# Patient Record
Sex: Female | Born: 1945 | Race: Black or African American | Hispanic: No | Marital: Married | Smoking: Never smoker
Health system: Southern US, Community
[De-identification: ages and names within clinical notes are randomized; demographics above are authoritative.]

## PROBLEM LIST (undated history)

## (undated) DIAGNOSIS — T8859XA Other complications of anesthesia, initial encounter: Secondary | ICD-10-CM

## (undated) DIAGNOSIS — M549 Dorsalgia, unspecified: Secondary | ICD-10-CM

## (undated) DIAGNOSIS — M2540 Effusion, unspecified joint: Secondary | ICD-10-CM

## (undated) DIAGNOSIS — G4700 Insomnia, unspecified: Secondary | ICD-10-CM

## (undated) DIAGNOSIS — G8929 Other chronic pain: Secondary | ICD-10-CM

## (undated) DIAGNOSIS — F329 Major depressive disorder, single episode, unspecified: Secondary | ICD-10-CM

## (undated) DIAGNOSIS — K5900 Constipation, unspecified: Secondary | ICD-10-CM

## (undated) DIAGNOSIS — I499 Cardiac arrhythmia, unspecified: Secondary | ICD-10-CM

## (undated) DIAGNOSIS — Z860100 Personal history of colon polyps, unspecified: Secondary | ICD-10-CM

## (undated) DIAGNOSIS — Z8719 Personal history of other diseases of the digestive system: Secondary | ICD-10-CM

## (undated) DIAGNOSIS — R600 Localized edema: Secondary | ICD-10-CM

## (undated) DIAGNOSIS — F419 Anxiety disorder, unspecified: Secondary | ICD-10-CM

## (undated) DIAGNOSIS — F32A Depression, unspecified: Secondary | ICD-10-CM

## (undated) DIAGNOSIS — I10 Essential (primary) hypertension: Secondary | ICD-10-CM

## (undated) DIAGNOSIS — Z8711 Personal history of peptic ulcer disease: Secondary | ICD-10-CM

## (undated) DIAGNOSIS — M1990 Unspecified osteoarthritis, unspecified site: Secondary | ICD-10-CM

## (undated) DIAGNOSIS — M2550 Pain in unspecified joint: Secondary | ICD-10-CM

## (undated) DIAGNOSIS — Z9289 Personal history of other medical treatment: Secondary | ICD-10-CM

## (undated) DIAGNOSIS — R609 Edema, unspecified: Secondary | ICD-10-CM

## (undated) DIAGNOSIS — R351 Nocturia: Secondary | ICD-10-CM

## (undated) DIAGNOSIS — T4145XA Adverse effect of unspecified anesthetic, initial encounter: Secondary | ICD-10-CM

## (undated) DIAGNOSIS — K219 Gastro-esophageal reflux disease without esophagitis: Secondary | ICD-10-CM

## (undated) HISTORY — PX: JOINT REPLACEMENT: SHX530

## (undated) HISTORY — PX: WISDOM TOOTH EXTRACTION: SHX21

## (undated) HISTORY — PX: BACK SURGERY: SHX140

## (undated) HISTORY — DX: Personal history of peptic ulcer disease: Z87.11

## (undated) HISTORY — PX: LUMBAR FUSION: SHX111

## (undated) HISTORY — DX: Dorsalgia, unspecified: M54.9

## (undated) HISTORY — PX: COLONOSCOPY: SHX174

## (undated) HISTORY — DX: Essential (primary) hypertension: I10

## (undated) HISTORY — DX: Other chronic pain: G89.29

## (undated) HISTORY — PX: SHOULDER SURGERY: SHX246

## (undated) HISTORY — DX: Personal history of other diseases of the digestive system: Z87.19

## (undated) HISTORY — DX: Unspecified osteoarthritis, unspecified site: M19.90

## (undated) HISTORY — PX: TOE SURGERY: SHX1073

## (undated) HISTORY — DX: Constipation, unspecified: K59.00

---

## 1997-12-29 ENCOUNTER — Ambulatory Visit (HOSPITAL_COMMUNITY): Admission: RE | Admit: 1997-12-29 | Discharge: 1997-12-29 | Payer: Self-pay | Admitting: Orthopedic Surgery

## 1998-05-21 ENCOUNTER — Encounter: Payer: Self-pay | Admitting: *Deleted

## 1998-05-21 ENCOUNTER — Ambulatory Visit (HOSPITAL_COMMUNITY): Admission: RE | Admit: 1998-05-21 | Discharge: 1998-05-21 | Payer: Self-pay | Admitting: *Deleted

## 1998-06-01 ENCOUNTER — Ambulatory Visit (HOSPITAL_COMMUNITY): Admission: RE | Admit: 1998-06-01 | Discharge: 1998-06-01 | Payer: Self-pay | Admitting: *Deleted

## 1998-06-01 ENCOUNTER — Encounter: Payer: Self-pay | Admitting: *Deleted

## 2001-01-14 ENCOUNTER — Emergency Department (HOSPITAL_COMMUNITY): Admission: EM | Admit: 2001-01-14 | Discharge: 2001-01-14 | Payer: Self-pay

## 2006-06-07 ENCOUNTER — Emergency Department (HOSPITAL_COMMUNITY): Admission: EM | Admit: 2006-06-07 | Discharge: 2006-06-07 | Payer: Self-pay | Admitting: Emergency Medicine

## 2008-01-04 ENCOUNTER — Ambulatory Visit (HOSPITAL_BASED_OUTPATIENT_CLINIC_OR_DEPARTMENT_OTHER): Admission: RE | Admit: 2008-01-04 | Discharge: 2008-01-04 | Payer: Self-pay | Admitting: Orthopedic Surgery

## 2010-10-29 NOTE — Op Note (Signed)
NAME:  Brandi [name], Brandi [name] NO.:  [id number]   MEDICAL RECORD NO.:  [id number]          PATIENT TYPE:  AMB   LOCATION:  [facility]                         FACILITY:  [facility]   PHYSICIAN:  Deidre Ala, M.D.    DATE OF BIRTH:  1945-11-21   DATE OF PROCEDURE:  01/04/2008  DATE OF DISCHARGE:                               OPERATIVE REPORT   PREOPERATIVE DIAGNOSIS:  1. Metatarsalgia, second metatarsal, right foot with transfer      pressurization with hallux rigidus.  2. Deformed right third toe nail, probable onychomycosis.   POSTOPERATIVE DIAGNOSIS:  1. Metatarsalgia, second metatarsal, right foot with transfer      pressurization with hallux rigidus.  2. Deformed right third toe nail, probable onychomycosis.   PROCEDURE:  1. Right second dorsal wedge osteotomy at metatarsal neck with local      autograft.  2. Third toe nail plate excision.   SURGEON:  Doristine Section, MD.   ASSISTANT:  Phineas Semen, PA-C.   ANESTHESIA:  General with LMA.   CULTURES:  None.   DRAINS:  None.   ESTIMATED BLOOD LOSS:  Minimal.   TOURNIQUET:  Esmarch tourniquet time 22 minutes.   PATHOLOGIC FINDINGS AND HISTORY:  Brandi [name] is an old patient of mine  who has had some previous foot surgery where she had surgery on the  right first MTP joint.  She has developed an arthritis there that makes  it stiff but not painful.  The problem is it causes a transfer lesion to  a relatively over long second metatarsal which then takes the pressure.  The third is not so inclined.  It was felt that she would benefit from a  dorsal wedge osteotomy of the second affecting upward depressurization  of the head as well as shortening.  This is what was done with local  autograft.  In addition, she desired removal but not ablation of her  right third toe nail which was overgrown, hard and onychomycotic, which  we did.   PROCEDURE:  With adequate anesthesia obtained using LMA technique, 1  gram Ancef was  given by IV prophylaxis, the patient was placed in the  supine position.  The right foot was prepped from the toes to the upper  calf in the standard fashion.  After standard prepping and draping,  Esmarch exsanguination was used and the tourniquet left on above the  ankle.  I then took a Freer and scissors and avulsed the nail plate off  the right third toe bed.  I then turned attention to the second  metatarsal where a web incision was made between 2 and 3.  The incision  was deepened sharply with a knife and hemostasis obtained using the  Bovie electrocoagulator.  Dissection was carried down to the metatarsal  neck, where soft tissues were dissected and the extensor tendon moved to  one side.  Retractors were placed and a dorsal wedge osteotomy was  carried out with an oscillating saw with a V shaped backwards.  I then  cracked the metatarsal head up to depressurize it and used the  wedge of  the bone removed with cancellous bone as morselized bone graft to pack  in and around it.  I did use one 3-0 Vicryl stitch on the dorsal  periosteum to hold the osteotomy in place.  I then closed the wound with  interrupted and running 4-0 nylon and placed a Adaptic gauze over the  right third toe nail bed and injected 0.5% Marcaine about the wound.  A  bulky compressive forefoot dressing was then carried out with gauze and  Ace and the patient was awakened, having tolerated the procedure well,  taken to the recovery room in satisfactory condition to be discharged  per outpatient routine, weightbearing as tolerated on her heel,  elevation and a wooden sole shoe.  Told to call the office for recheck  on Friday a.m. for a recheck.           ______________________________  V. Charlesetta Shanks, M.D.     VEP/MEDQ  D:  01/04/2008  T:  01/04/2008  Job:  [id number]

## 2010-11-05 LAB — HM MAMMOGRAPHY

## 2011-03-14 LAB — POCT HEMOGLOBIN-HEMACUE
Hemoglobin: 12.9
Operator id: 268271

## 2011-11-05 LAB — HM PAP SMEAR

## 2011-11-05 LAB — HM COLONOSCOPY

## 2012-07-02 ENCOUNTER — Encounter: Payer: Self-pay | Admitting: Medical

## 2012-07-02 ENCOUNTER — Ambulatory Visit (INDEPENDENT_AMBULATORY_CARE_PROVIDER_SITE_OTHER): Payer: 59 | Admitting: Medical

## 2012-07-02 VITALS — BP 140/80 | HR 68 | Temp 98.3°F | Resp 16 | Wt 139.0 lb

## 2012-07-02 DIAGNOSIS — G8929 Other chronic pain: Secondary | ICD-10-CM

## 2012-07-02 DIAGNOSIS — J329 Chronic sinusitis, unspecified: Secondary | ICD-10-CM

## 2012-07-02 DIAGNOSIS — M549 Dorsalgia, unspecified: Secondary | ICD-10-CM

## 2012-07-02 DIAGNOSIS — M1990 Unspecified osteoarthritis, unspecified site: Secondary | ICD-10-CM

## 2012-07-02 DIAGNOSIS — J069 Acute upper respiratory infection, unspecified: Secondary | ICD-10-CM

## 2012-07-02 MED ORDER — AMOXICILLIN-POT CLAVULANATE 875-125 MG PO TABS: 1.0000 | ORAL_TABLET | Freq: Two times a day (BID) | ORAL | Status: DC

## 2012-07-02 MED ORDER — OXYCODONE-ACETAMINOPHEN 5-325 MG PO TABS: 1.0000 | ORAL_TABLET | ORAL | Status: DC | PRN

## 2012-07-02 NOTE — Progress Notes (Signed)
Subjective: New patient today. Just moved back from [place].   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  Recommended by Dr. Doristine Section to come here.   Here for illness.  She reports sneezing, wheezing, runny eyes, headache, stopped up, cough, chest congestion, mucous production, achy, all x 2 weeks.  Husband been sick too with same.   Using some OTC Nyquil.  Ears feel stopped up, some sore throat.   Some nausea and vomiting.  She does not get flu vaccine as the last time gave her "12 colds."   She has never had the pneumonia vaccine.   Has hx/o arthritis.  Has arthritis back, fingers, and right ankle.   Hx/o lumbar fusion.  Been on Oxycodone/tylenol for years, but out of her pain medication.  Can't take NSAIDs due to hx/o ulcer. Would like short supply until she can see an arthritis doctor.    No other new c/o.   Allergies  Allergen Reactions  . Aspirin     Current Outpatient Prescriptions on File Prior to Visit  Medication Sig Dispense Refill  . ranitidine (ZANTAC) 75 MG tablet Take 75 mg by mouth 2 (two) times daily.        Past Medical History  Diagnosis Date  . Osteoarthritis   . Chronic back pain   . History of gastric ulcer     Past Surgical History  Procedure Date  . Foot surgery   . Lumbar fusion   . Shoulder surgery     History reviewed. No pertinent family history.  History   Social History  . Marital Status: Married    Spouse Name: N/A    Number of Children: N/A  . Years of Education: N/A   Occupational History  . Not on file.   Social History Main Topics  . Smoking status: Never Smoker   . Smokeless tobacco: Not on file  . Alcohol Use: No  . Drug Use: No  . Sexually Active: Not on file   Other Topics Concern  . Not on file   Social History Narrative  . No narrative on file   Reviewed their medical, surgical, family, social, medication, and allergy history and updated chart as appropriate.  Objective: Gen: wd, wn, nad, pleasant AA female,  appears a little older than stated age Skin: warm, dry Heent: tender over sinuses, TMs pearly, nares patent, pharynx normal Neck: supple, nontender, no mass, no thyromegaly Heart: RRR, normal S1, S2, no murmur Lungs: coarse, but no wheezes or rales Abdomen: nontender, no mass or organomegaly Back: lumbar vertical surgical scar, mild tenderness lumbar region, ROM somewhat slow and stiff MSK: mild tenderness of hand/fingers, but only mild arthritis changes, nontender rest of UE and LE    Assessment: Encounter Diagnoses  Name Primary?  . Sinusitis Yes  . URI (upper respiratory infection)   . Chronic back pain   . Osteoarthritis    Plan: Sinusitis, URI - script for Augmentin, nasal saline, Mucinex DM OTC, rest, hydrate well, recheck if not improving.  Chronic back pain, OA - #30 of Oxycodone/tylenol 5/325 for now.  Will request old records.   Discussed medications, other options.  I don't suspect abuse of medications.

## 2012-07-02 NOTE — Patient Instructions (Signed)
Consider nasal saline flush, consider Mucinex DM OTC for congestion and cough, increase water intake, and begin Augmentin antibiotic twice daily for 10 days with food.  Eat some  Yogurt while you are on this medication.  I am treating you for a sinus and respiratory infection.   Call or return if not improving.

## 2012-12-07 ENCOUNTER — Ambulatory Visit (INDEPENDENT_AMBULATORY_CARE_PROVIDER_SITE_OTHER): Payer: Medicare Other | Admitting: Medical

## 2012-12-07 ENCOUNTER — Encounter: Payer: Self-pay | Admitting: Medical

## 2012-12-07 VITALS — BP 130/80 | HR 72 | Temp 98.1°F | Resp 16 | Wt 141.0 lb

## 2012-12-07 DIAGNOSIS — Z800 Family history of malignant neoplasm of digestive organs: Secondary | ICD-10-CM

## 2012-12-07 DIAGNOSIS — J309 Allergic rhinitis, unspecified: Secondary | ICD-10-CM

## 2012-12-07 DIAGNOSIS — G8929 Other chronic pain: Secondary | ICD-10-CM

## 2012-12-07 DIAGNOSIS — M549 Dorsalgia, unspecified: Secondary | ICD-10-CM | POA: Diagnosis not present

## 2012-12-07 DIAGNOSIS — K5900 Constipation, unspecified: Secondary | ICD-10-CM | POA: Diagnosis not present

## 2012-12-07 DIAGNOSIS — G4700 Insomnia, unspecified: Secondary | ICD-10-CM

## 2012-12-07 MED ORDER — FLUTICASONE PROPIONATE 50 MCG/ACT NA SUSP: 2.0000 | Freq: Every day | NASAL | Status: DC

## 2012-12-07 MED ORDER — LUBIPROSTONE 24 MCG PO CAPS: 24.0000 ug | ORAL_CAPSULE | Freq: Two times a day (BID) | ORAL | Status: DC

## 2012-12-07 MED ORDER — CETIRIZINE HCL 10 MG PO TABS: 10.0000 mg | ORAL_TABLET | Freq: Every day | ORAL | Status: DC

## 2012-12-07 MED ORDER — OXYCODONE-ACETAMINOPHEN 5-325 MG PO TABS
1.0000 | ORAL_TABLET | ORAL | Status: DC | PRN
Start: 2012-12-07 — End: 2013-11-04

## 2012-12-07 NOTE — Progress Notes (Signed)
Subjective: Here for f/u and numerous concerns.  Has a list with her today.     She reports ongoing congestion problems since January.  She reports runny nose, sneezing, cough, headache, itchy watery eyes, head congestion.  She reports BMs 1-2 times per week, has to use laxatives frequently.  Can't go without laxative.    Has hx/o arthritis.  Has arthritis back, fingers, and right ankle.   Hx/o lumbar fusion.  Been on Oxycodone/tylenol for years, but out of her pain medication.  Can't take NSAIDs due to hx/o ulcer. Lately worse numbness in right big toe.  Pain with ROM.    Of note, has family history of colon cancer.  Mother, father, and sister all died from colon cancer.  Gets yearly colonoscopy.  insomnia - longstanding.  Partly due to pain.  But has problems falling and staying asleep.  Has used sleep aids in the past.  Wants help with this.   Allergies  Allergen Reactions  . Aspirin     Current Outpatient Prescriptions on File Prior to Visit  Medication Sig Dispense Refill  . ranitidine (ZANTAC) 75 MG tablet Take 75 mg by mouth 2 (two) times daily.       No current facility-administered medications on file prior to visit.    Past Medical History  Diagnosis Date  . Osteoarthritis   . Chronic back pain   . History of gastric ulcer     Past Surgical History  Procedure Laterality Date  . Foot surgery    . Lumbar fusion    . Shoulder surgery      No family history on file.  History   Social History  . Marital Status: Married    Spouse Name: N/A    Number of Children: N/A  . Years of Education: N/A   Occupational History  . Not on file.   Social History Main Topics  . Smoking status: Never Smoker   . Smokeless tobacco: Not on file  . Alcohol Use: No  . Drug Use: No  . Sexually Active: Not on file   Other Topics Concern  . Not on file   Social History Narrative  . No narrative on file   Reviewed their medical, surgical, family, social, medication, and  allergy history and updated chart as appropriate.  Objective: Gen: wd, wn, nad, pleasant AA female, appears a little older than stated age Skin: warm, dry Heent: conjunctiva pink, TMs pearly, nares patent, pharynx normal Neck: supple, nontender, no mass, no thyromegaly Heart: RRR, normal S1, S2, no murmur Lungs: clear Abdomen: nontender, no mass or organomegaly Back: lumbar vertical surgical scar, mild tenderness lumbar region, ROM limited due to pain MSK: mild tenderness of hand/fingers, but only mild arthritis changes, nontender rest of UE and LE Neuro: +bilat SLR, normal LE strength, DTRs, sensation Ext no edema Pulses normal    Assessment: Encounter Diagnoses  Name Primary?  . Allergic rhinitis Yes  . Chronic back pain   . Unspecified constipation   . Family history of colon cancer   . Insomnia    Plan: Allergic rhinitis - begin Flonase, cetirizine, avoid triggers.  Recheck 71mo Chronic back pain, OA - #30 of Oxycodone/tylenol 5/325 for now.  Will review old records.   Discussed medications, other options.  constipation - increase fiber and water intake, begin Amitiza.  Discussed risks/benefits of medications, options for management Family hx/o colon cancer - c/t routine colonoscopies insomnia - deferred to next visit

## 2012-12-13 ENCOUNTER — Telehealth: Payer: Self-pay | Admitting: Medical

## 2012-12-13 DIAGNOSIS — M25559 Pain in unspecified hip: Secondary | ICD-10-CM | POA: Diagnosis not present

## 2012-12-13 NOTE — Telephone Encounter (Signed)
FYI           3 RECORDS RELEASES FILLED OUT & PT TO COME BY & SIGN

## 2012-12-13 NOTE — Telephone Encounter (Signed)
Message copied by Ruffin Frederick on Mon Dec 13, 2012 11:48 AM ------      Message from: Jac Canavan      Created: Thu Dec 09, 2012  7:39 AM       Pt was in this week, signed records release.             The records we received basically show prior refills.  There isn't much else in the records.  I need additional info faxed to [place].  I have the chart and will bring back tomorrow            This is what I need form prior doctor to make referral to back specialist or pain clinic:      1) prior MRI, xrays, CT of back      2) prior dictated office notes showing her medical history, diagnosis, treatment plans.  There records I received showed very little, and noting to help the evaluation/management      3) prior labs, colonoscopy, mammogram, pap      4) prior specialist notes regarding her back and shoulder ------

## 2012-12-22 DIAGNOSIS — M25559 Pain in unspecified hip: Secondary | ICD-10-CM | POA: Diagnosis not present

## 2012-12-23 ENCOUNTER — Telehealth: Payer: Self-pay | Admitting: Medical

## 2012-12-24 ENCOUNTER — Encounter: Payer: Self-pay | Admitting: Internal Medicine

## 2012-12-24 NOTE — Telephone Encounter (Signed)
I gave specific information in prior phone message (look at that message again) showing what items I need from prior doctor.  I DO NOT have that information.  All I have is a bunch of chart records showing refill after refill.  There is basically no usable info in the records we have received to show me what her diagnoses are, what studies or treatments have been attempted, etc.   Pls pull paper chart.

## 2012-12-24 NOTE — Telephone Encounter (Signed)
Pt states she has sent the records over and we have them from the doctor who prescribed the med

## 2012-12-24 NOTE — Telephone Encounter (Signed)
Patient is aware that we need more information from her doctors as to why she is on this pain medication. The records that we received doesn't have any documentation as to why she takes the medication. CLS

## 2012-12-24 NOTE — Telephone Encounter (Signed)
See 12/13/12 msg.   Have we gotten in those records?   It will be hard for me to keep refilling such a strong pain medication without established records.

## 2012-12-24 NOTE — Telephone Encounter (Signed)
Here is the message.

## 2012-12-24 NOTE — Telephone Encounter (Signed)
lmom to cb. cls 

## 2013-01-03 ENCOUNTER — Encounter: Payer: Medicare Other | Admitting: Medical

## 2013-01-04 DIAGNOSIS — J019 Acute sinusitis, unspecified: Secondary | ICD-10-CM | POA: Diagnosis not present

## 2013-01-14 DIAGNOSIS — J3089 Other allergic rhinitis: Secondary | ICD-10-CM | POA: Diagnosis not present

## 2013-01-14 DIAGNOSIS — J301 Allergic rhinitis due to pollen: Secondary | ICD-10-CM | POA: Diagnosis not present

## 2013-01-14 DIAGNOSIS — J019 Acute sinusitis, unspecified: Secondary | ICD-10-CM | POA: Diagnosis not present

## 2013-01-14 DIAGNOSIS — H1045 Other chronic allergic conjunctivitis: Secondary | ICD-10-CM | POA: Diagnosis not present

## 2013-03-14 DIAGNOSIS — J019 Acute sinusitis, unspecified: Secondary | ICD-10-CM | POA: Diagnosis not present

## 2013-03-14 DIAGNOSIS — H6980 Other specified disorders of Eustachian tube, unspecified ear: Secondary | ICD-10-CM | POA: Diagnosis not present

## 2013-05-10 ENCOUNTER — Ambulatory Visit (INDEPENDENT_AMBULATORY_CARE_PROVIDER_SITE_OTHER): Payer: Medicare Other | Admitting: Medical

## 2013-05-10 ENCOUNTER — Encounter: Payer: Self-pay | Admitting: Medical

## 2013-05-10 VITALS — BP 140/80 | HR 83 | Temp 98.1°F | Resp 16 | Wt 144.0 lb

## 2013-05-10 DIAGNOSIS — J209 Acute bronchitis, unspecified: Secondary | ICD-10-CM

## 2013-05-10 DIAGNOSIS — H6123 Impacted cerumen, bilateral: Secondary | ICD-10-CM

## 2013-05-10 DIAGNOSIS — J3489 Other specified disorders of nose and nasal sinuses: Secondary | ICD-10-CM

## 2013-05-10 DIAGNOSIS — M545 Low back pain: Secondary | ICD-10-CM

## 2013-05-10 DIAGNOSIS — R0981 Nasal congestion: Secondary | ICD-10-CM

## 2013-05-10 DIAGNOSIS — H6120 Impacted cerumen, unspecified ear: Secondary | ICD-10-CM

## 2013-05-10 DIAGNOSIS — G8929 Other chronic pain: Secondary | ICD-10-CM

## 2013-05-10 MED ORDER — ALBUTEROL SULFATE HFA 108 (90 BASE) MCG/ACT IN AERS: 2.0000 | INHALATION_SPRAY | Freq: Four times a day (QID) | RESPIRATORY_TRACT | Status: DC | PRN

## 2013-05-10 MED ORDER — LEVOFLOXACIN 500 MG PO TABS: 500.0000 mg | ORAL_TABLET | Freq: Every day | ORAL | Status: DC

## 2013-05-10 MED ORDER — FLUTICASONE PROPIONATE 50 MCG/ACT NA SUSP: 2.0000 | Freq: Every day | NASAL | Status: DC

## 2013-05-10 MED ORDER — METHYLPREDNISOLONE 4 MG PO KIT: PACK | ORAL | Status: DC

## 2013-05-10 NOTE — Progress Notes (Signed)
Subjective:  Brandi [name] is a 67 y.o. female who presents for cough, sinus pressure.  She reports several weeks of congestion/cold symptoms but has had 7-8 day hx/o worse headache, ear pressure, sinus pressure, sore throat, teeth pain.  Denies fever, NVD, rash, SOB. No sick contacts.  No other aggravating or relieving factors.  No other c/o.  The following portions of the patient's history were reviewed and updated as appropriate: allergies, current medications, past family history, past medical history, past social history, past surgical history and problem list.  ROS as in subjective  Past Medical History  Diagnosis Date  . Osteoarthritis   . Chronic back pain   . History of gastric ulcer      Objective: BP 140/80  Pulse 83  Temp(Src) 98.1 F (36.7 C) (Oral)  Resp 16  Wt 144 lb (65.318 kg)  General appearance: Alert, WD/WN, no distress                             Skin: warm, no rash, no diaphoresis                           Head: maxillary sinus tenderness                            Eyes: conjunctiva normal, corneas clear, PERRLA                            Ears: pearly TMs, external ear canals normal                          Nose: septum midline, turbinates swollen, with erythema and clear discharge             Mouth/throat: MMM, tongue normal, mild pharyngeal erythema                           Neck: supple, no adenopathy, no thyromegaly, nontender                          Heart: RRR, normal S1, S2, no murmurs                         Lungs: +bronchial breath sounds, +scattered rhonchi, no wheezes, no rales                Extremities: no edema, nontender     Assessment: Encounter Diagnoses  Name Primary?  . Acute bronchitis Yes  . Sinus congestion   . Impacted cerumen, bilateral   . Chronic low back pain     Plan:  Medication orders today include:  Medrol dose pak, Albuterol inhaler, Flonase nasal, Levaquin.  Discussed diagnosis and treatment of bronchitis.   Suggested symptomatic OTC remedies for cough and congestion.  Tylenol or Ibuprofen OTC for fever and malaise.  Call/return in 2-3 days if symptoms are worse or not improving.   Return at her convenience for ear lavage.  She will have her prior provider in [place] send [place] xrays and a more thorough explanation of her treatment and diagnostic info regarding her chronic back pain.

## 2013-05-10 NOTE — Patient Instructions (Signed)
Rest, drink plenty of water, begin Medrol steroid Dosepak as directed.    Begin Albuterol inhaler, 1-2 puffs every 6 hours for cough, wheezing, chest tightness, or shortness of breath.     If fever, sinus pressure, worse congestion, then begin the antibiotic Levaquin.

## 2013-05-11 ENCOUNTER — Encounter: Payer: Self-pay | Admitting: Medical

## 2013-06-07 DIAGNOSIS — J309 Allergic rhinitis, unspecified: Secondary | ICD-10-CM | POA: Diagnosis not present

## 2013-06-14 ENCOUNTER — Encounter: Payer: Self-pay | Admitting: Medical

## 2013-06-14 ENCOUNTER — Ambulatory Visit (INDEPENDENT_AMBULATORY_CARE_PROVIDER_SITE_OTHER): Payer: Medicare Other | Admitting: Medical

## 2013-06-14 VITALS — BP 120/80 | HR 88 | Temp 98.1°F | Resp 16 | Wt 144.0 lb

## 2013-06-14 DIAGNOSIS — Z01818 Encounter for other preprocedural examination: Secondary | ICD-10-CM

## 2013-06-14 DIAGNOSIS — I459 Conduction disorder, unspecified: Secondary | ICD-10-CM

## 2013-06-14 DIAGNOSIS — I499 Cardiac arrhythmia, unspecified: Secondary | ICD-10-CM

## 2013-06-14 LAB — COMPREHENSIVE METABOLIC PANEL
ALT: 10 U/L (ref 0–35)
AST: 13 U/L (ref 0–37)
BUN: 9 mg/dL (ref 6–23)
CO2: 37 mEq/L — ABNORMAL HIGH (ref 19–32)
Creat: 0.9 mg/dL (ref 0.50–1.10)
Total Bilirubin: 0.4 mg/dL (ref 0.3–1.2)

## 2013-06-14 LAB — CBC WITH DIFFERENTIAL/PLATELET
Basophils Absolute: 0 10*3/uL (ref 0.0–0.1)
Basophils Relative: 0 % (ref 0–1)
Eosinophils Relative: 1 % (ref 0–5)
HCT: 38.3 % (ref 36.0–46.0)
Hemoglobin: 12.9 g/dL (ref 12.0–15.0)
Lymphocytes Relative: 32 % (ref 12–46)
MCHC: 33.7 g/dL (ref 30.0–36.0)
MCV: 97.7 fL (ref 78.0–100.0)
Monocytes Absolute: 0.9 10*3/uL (ref 0.1–1.0)
Monocytes Relative: 11 % (ref 3–12)
Neutro Abs: 4.8 10*3/uL (ref 1.7–7.7)
Neutrophils Relative %: 56 % (ref 43–77)
RDW: 14 % (ref 11.5–15.5)
WBC: 8.7 10*3/uL (ref 4.0–10.5)

## 2013-06-14 NOTE — Progress Notes (Signed)
Subjective:   HPI  Brandi [name] is a 67 y.o. female who presents for a preop exam. She is having oral surgery Monday for teeth extraction, IV sedation.  She went in to see the oral surgeon recently, a rhythm strip was done and apparently there was some concern for skip beat. She denies chest pain, shortness of breath, wheezing, swelling, palpitations, syncope or dizziness. Has been in her usual state of health except for the sinus drainage she has been dealing with. No other new complaints.    She has had a remote episode of stopping breathing on the operating table with surgery years ago, but since then has had several surgeries without any problems.  She notes hx/o abnormal EKG years ago, but her long term primary care provider in [place] was aware of this and never felt her to have any heart issues or problems.   Of note she has a colonoscopy every other year due to strong family history of colon cancer, last colonoscopy 2012 under IV sedation without any problem.  Reviewed their medical, surgical, family, social, medication, and allergy history and updated chart as appropriate.  Past Medical History  Diagnosis Date  . Osteoarthritis   . Chronic back pain   . History of gastric ulcer   . Constipation   . Anesthesia complication     years ago, but no problems with several surgeries since    Past Surgical History  Procedure Laterality Date  . Foot surgery    . Lumbar fusion    . Shoulder surgery      History   Social History  . Marital Status: Married    Spouse Name: N/A    Number of Children: N/A  . Years of Education: N/A   Occupational History  . Not on file.   Social History Main Topics  . Smoking status: Never Smoker   . Smokeless tobacco: Not on file  . Alcohol Use: No  . Drug Use: No  . Sexual Activity: Not on file   Other Topics Concern  . 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 Cancer Mother   . Cancer Father   . Heart disease Neg Hx   . Cancer Sister 28    died of colon cancer    Current outpatient prescriptions:albuterol (PROVENTIL HFA;VENTOLIN HFA) 108 (90 BASE) MCG/ACT inhaler, Inhale 2 puffs into the lungs every 6 (six) hours as needed for wheezing or shortness of breath., Disp: 1 Inhaler, Rfl: 0;  cetirizine (ZYRTEC) 10 MG tablet, Take 1 tablet (10 mg total) by mouth daily., Disp: 30 tablet, Rfl: 2;  estrogen, conjugated,-medroxyprogesterone (PREMPRO) 0.3-1.5 MG per tablet, Take 1 tablet by mouth daily., Disp: , Rfl:  fluticasone (FLONASE) 50 MCG/ACT nasal spray, Place 2 sprays into both nostrils daily., Disp: 16 g, Rfl: 5;  lubiprostone (AMITIZA) 24 MCG capsule, Take 1 capsule (24 mcg total) by mouth 2 (two) times daily with a meal., Disp: 60 capsule, Rfl: 2;  oxyCODONE-acetaminophen (ROXICET) 5-325 MG per tablet, Take 1 tablet by mouth every 4 (four) hours as needed for pain., Disp: 30 tablet, Rfl: 0 ranitidine (ZANTAC) 75 MG tablet, Take 75 mg by mouth 2 (two) times daily., Disp: , Rfl:   Allergies  Allergen Reactions  . Aspirin     Review of Systems Constitutional: -fever, -chills, -sweats, -unexpected weight change, -decreased appetite, -fatigue  Allergy: +sneezing, -itching, +congestion Dermatology: -changing moles, --rash, -lumps ENT: +runny nose, -ear pain, -sore throat, -hoarseness, -sinus pain, -teeth pain, - ringing in ears, -hearing loss, -nosebleeds Cardiology: -chest pain, -palpitations, -swelling, -difficulty breathing when lying flat, -waking up short of breath Respiratory: -cough, -shortness of breath, -difficulty breathing with exercise or exertion, -wheezing, -coughing up blood Gastroenterology: -abdominal pain, -nausea, -vomiting, -diarrhea, -constipation, -blood in stool, -changes in bowel movement, -difficulty swallowing or eating Hematology: -bleeding, -bruising  Musculoskeletal: -joint aches, -muscle  aches, -joint swelling, +chronic back pain, -neck pain, -cramping, -changes in gait Ophthalmology: denies vision changes, eye redness, itching, discharge Urology: -burning with urination, -difficulty urinating, -blood in urine, -urinary frequency, -urgency, -incontinence Neurology: -headache, -weakness, -tingling, -numbness, -memory loss, -falls, -dizziness Psychology: -depressed mood, -agitation, -sleep problems     Objective:   Physical Exam  BP 120/80  Pulse 88  Temp(Src) 98.1 F (36.7 C) (Oral)  Resp 16  Wt 144 lb (65.318 kg)  General appearance: alert, no distress, WD/WN, lean AA female Skin: roundish patches of rough dry skin on torso arms and legs throughout, suggestive of psoriasis vs eczema, no scaling or erythema HEENT: normocephalic, conjunctiva/corneas normal, sclerae anicteric, PERRLA, EOMi, nares patent, no discharge or erythema, pharynx normal Oral cavity: MMM, tongue normal Neck: supple, no lymphadenopathy, no thyromegaly, no masses, normal ROM, no bruits Chest: non tender, normal shape and expansion Heart: RRR, normal S1, S2, no murmurs Lungs: CTA bilaterally, no wheezes, rhonchi, or rales Abdomen: +bs, soft, non tender, non distended, no masses, no hepatomegaly, no splenomegaly, no bruits Back: mild lumbar paraspinal tenderness, mild pain with back flexion and extension Musculoskeletal: upper extremities non tender, no obvious deformity, normal ROM throughout, lower extremities non tender, no obvious deformity, normal ROM throughout Extremities: no edema, no cyanosis, no clubbing Pulses: 2+ symmetric, upper and lower extremities, normal cap refill Neurological: alert, oriented x 3, CN2-12 intact, strength normal upper extremities and lower extremities, sensation normal throughout, DTRs 2+ throughout, no cerebellar signs, gait normal Psychiatric: normal affect, behavior normal, pleasant  Breast/rectal/gyn - deferred   Adult ECG Report  Indication: abnormal rhythm  strip at oral surgeon office  Rate: 87bpm Rhythm: sinus rhythm with sinus arrythmia with occasional PVC  QRS Axis: 26 degrees  PR Interval:  QRS Duration: 66ms  QTc:  Conduction Disturbances: 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  . Preop general physical exam Yes  . Skipped heart beats      Physical exam - discussed healthy lifestyle, diet, exercise, preventative care, vaccinations, and addressed their concerns.  Handout given. Reviewed EKG.   we'll request her prior EKG from [place]/prior PCM, and we'll call her back with plan.  May or may not need to have cardiology clearance, to be determined.  Labs today.  Follow-up pending labs.

## 2013-06-15 ENCOUNTER — Other Ambulatory Visit: Payer: Self-pay | Admitting: Medical

## 2013-06-15 ENCOUNTER — Encounter: Payer: Self-pay | Admitting: Family Medicine

## 2013-06-15 ENCOUNTER — Other Ambulatory Visit: Payer: Self-pay | Admitting: Family Medicine

## 2013-06-15 LAB — TSH: TSH: 0.625 u[IU]/mL (ref 0.350–4.500)

## 2013-06-15 MED ORDER — PREDNISONE 20 MG PO TABS: 20.0000 mg | ORAL_TABLET | Freq: Two times a day (BID) | ORAL | Status: DC

## 2013-06-17 ENCOUNTER — Encounter: Payer: Self-pay | Admitting: Family Medicine

## 2013-06-23 ENCOUNTER — Encounter: Payer: Self-pay | Admitting: Medical

## 2013-08-10 ENCOUNTER — Ambulatory Visit (INDEPENDENT_AMBULATORY_CARE_PROVIDER_SITE_OTHER): Payer: Medicare Other | Admitting: Family Medicine

## 2013-08-10 ENCOUNTER — Encounter: Payer: Self-pay | Admitting: Family Medicine

## 2013-08-10 VITALS — BP 142/104 | HR 92 | Temp 98.4°F | Ht 64.0 in | Wt 151.0 lb

## 2013-08-10 DIAGNOSIS — J309 Allergic rhinitis, unspecified: Secondary | ICD-10-CM | POA: Diagnosis not present

## 2013-08-10 DIAGNOSIS — R197 Diarrhea, unspecified: Secondary | ICD-10-CM

## 2013-08-10 DIAGNOSIS — J069 Acute upper respiratory infection, unspecified: Secondary | ICD-10-CM

## 2013-08-10 DIAGNOSIS — J300 Vasomotor rhinitis: Secondary | ICD-10-CM

## 2013-08-10 DIAGNOSIS — R030 Elevated blood-pressure reading, without diagnosis of hypertension: Secondary | ICD-10-CM

## 2013-08-10 DIAGNOSIS — IMO0001 Reserved for inherently not codable concepts without codable children: Secondary | ICD-10-CM

## 2013-08-10 NOTE — Patient Instructions (Signed)
Drink plenty of fluids. You may have had a virus, but now your exam suggests at least a component of allergies.  You do not YET have a sinus infection--let's try and prevent that from developing. Restart flonase and zyrtec.  Use mucinex (guaifenesin) as needed to keep secretions thin.  Avoid decongestants/sinus medications which can raise blood pressure.  I recommend trial of sinus rinses--either Neti-pot or Sinus Rinse Kit.  Do this once or twice daily.  Check your blood pressure at home, and keep a list.  Bring the list of blood pressures and your monitor to your next visit (in 1-2 weeks). Try and follow a low sodium diet (see below).  For the diarrhea-- Avoid dairy products for at least 5 days.  Consider using probiotics (such as Align, vs yogurt form, like Activia).  Eat bland foods, avoid spicy, greasy, acidic foods, which might make your stools worse.  Sodium-Controlled Diet Sodium is a mineral. It is found in many foods. Sodium may be found naturally or added during the making of a food. The most common form of sodium is salt, which is made up of sodium and chloride. Reducing your sodium intake involves changing your eating habits. The following guidelines will help you reduce the sodium in your diet:  Stop using the salt shaker.  Use salt sparingly in cooking and baking.  Substitute with sodium-free seasonings and spices.  Do not use a salt substitute (potassium chloride) without your caregiver's permission.  Include a variety of fresh, unprocessed foods in your diet.  Limit the use of processed and convenience foods that are high in sodium. USE THE FOLLOWING FOODS SPARINGLY: Breads/Starches  Commercial bread stuffing, commercial pancake or waffle mixes, coating mixes. Waffles. Croutons. Prepared (boxed or frozen) potato, rice, or noodle mixes that contain salt or sodium. Salted [place] fries or hash browns. Salted popcorn, breads, crackers, chips, or snack  foods. Vegetables  Vegetables canned with salt or prepared in cream, butter, or cheese sauces. Sauerkraut. Tomato or vegetable juices canned with salt.  Fresh vegetables are allowed if rinsed thoroughly. Fruit  Fruit is okay to eat. Meat and Meat Substitutes  Salted or smoked meats, such as bacon or Canadian bacon, chipped or corned beef, hot dogs, salt pork, luncheon meats, pastrami, ham, or sausage. Canned or smoked fish, poultry, or meat. Processed cheese or cheese spreads, blue or Roquefort cheese. Battered or frozen fish products. Prepared spaghetti sauce. Baked beans. Reuben sandwiches. Salted nuts. Caviar. Milk  Limit buttermilk to 1 cup per week. Soups and Combination Foods  Bouillon cubes, canned or dried soups, broth, consomm. Convenience (frozen or packaged) dinners with more than 600 mg sodium. Pot pies, pizza, Asian food, fast food cheeseburgers, and specialty sandwiches. Desserts and Sweets  Regular (salted) desserts, pie, commercial fruit snack pies, commercial snack cakes, canned puddings.  Eat desserts and sweets in moderation. Fats and Oils  Gravy mixes or canned gravy. No more than 1 to 2 tbs of salad dressing. Chip dips.  Eat fats and oils in moderation. Beverages  See those listed under the vegetables and milk groups. Condiments  Ketchup, mustard, meat sauces, salsa, regular (salted) and lite soy sauce or mustard. Dill pickles, olives, meat tenderizer. Prepared horseradish or pickle relish. Dutch-processed cocoa. Baking powder or baking soda used medicinally. Worcestershire sauce. "Light" salt. Salt substitute, unless approved by your caregiver. Document Released: 11/22/2001 Document Revised: 08/25/2011 Document Reviewed: 06/25/2009 [facility] Patient Information 2014 [place], [place].

## 2013-08-10 NOTE — Progress Notes (Signed)
Chief Complaint  Patient presents with  . Facial Pain    started Sunday while in [place] with sneezing, runny nose and cough. Also complaining of HA's. Did take coricidin and nyquil-just one dose late last night.,   10 days ago, while visiting in [place] (and staying in hotels) she started sneezing, ears stopped up, runny nose.  Then she woke up with myalgias. Cold symptoms persisted all last week.  Myalgias have improved. She returned to [place] 2 days ago.  Her ears remain stopped up.  She continues to have runny nose, somewhat yellow.  Nose runs a lot when she eats.  She is coughing occasionally throughout the day, sometimes productive of thick phlegm, mucusy/stringy, slightly yellowish.  She has slight shortness of breath which she relates to her cold (when going up 2-3 flights of stairs in her house)--maybe more related to fatigue.  Doesn't feel like she is wheezing, hasn't used her inhaler when feeling short of breath.    She is having some headaches which go across the entire front of her head, from temple, across forehead, to the other temple.  It is worse in the light, similar to a migraine.  Denies fevers, nausea or vomiting.  She has had some diarrhea, seems some mucus on the outside of the stool.  Having some loose stools after eating.  +milk, cheese/dairy intake.  She took coricidin, nyquil.  Denies taking any decongestants  Past Medical History  Diagnosis Date  . Osteoarthritis   . Chronic back pain   . History of gastric ulcer   . Constipation   . Anesthesia complication     years ago, but no problems with several surgeries since   Past Surgical History  Procedure Laterality Date  . Foot surgery    . Lumbar fusion    . Shoulder surgery     History   Social History  . Marital Status: Married    Spouse Name: N/A    Number of Children: N/A  . Years of Education: N/A   Occupational History  . retired    Social History Main Topics  . Smoking status: Never Smoker   . Smokeless  tobacco: Never Used  . Alcohol Use: No  . Drug Use: No  . Sexual Activity: Not on file   Other Topics Concern  . Not on file   Social History Narrative   Retired from [employer] in [place], exercise - walking   retired Environmental manager, Musician, librarian    Outpatient Encounter Prescriptions as of 08/10/2013  Medication Sig  . estrogen, conjugated,-medroxyprogesterone (PREMPRO) 0.3-1.5 MG per tablet Take 1 tablet by mouth daily.  Marland Kitchen lubiprostone (AMITIZA) 24 MCG capsule Take 1 capsule (24 mcg total) by mouth 2 (two) times daily with a meal.  . oxyCODONE-acetaminophen (ROXICET) 5-325 MG per tablet Take 1 tablet by mouth every 4 (four) hours as needed for pain.  . ranitidine (ZANTAC) 75 MG tablet Take 75 mg by mouth 2 (two) times daily.  Marland Kitchen albuterol (PROVENTIL HFA;VENTOLIN HFA) 108 (90 BASE) MCG/ACT inhaler Inhale 2 puffs into the lungs every 6 (six) hours as needed for wheezing or shortness of breath.  . cetirizine (ZYRTEC) 10 MG tablet Take 1 tablet (10 mg total) by mouth daily.  . fluticasone (FLONASE) 50 MCG/ACT nasal spray Place 2 sprays into both nostrils daily.  . [DISCONTINUED] predniSONE (DELTASONE) 20 MG tablet Take 1 tablet (20 mg total) by mouth 2 (two) times daily with a meal.   (not currently using zyrtec,  albuterol or flonase)  Allergies  Allergen Reactions  . Aspirin   (h/o ulcer, no true allergy)  ROS:  Denies fevers, chills, nausea, vomiting, abdominal pain, dysuria, bleeding, bruising, rash, chest pain, palpitations, dizziness or other complaints except as noted in HPI  PHYSICAL EXAM: BP 142/104  Pulse 92  Temp(Src) 98.4 F (36.9 C) (Oral)  Ht 5' 4"  (1.626 m)  Wt 151 lb (68.493 kg)  BMI 25.91 kg/m2 172/104 on repeat by MD Very talkative, pleasant female in no distress.  Rarely cleared her throat, no cough or sneezing HEENT:  PERRL, EOMI, conjunctiva clear.  TM's and EAC's normal.  Nasal mucosa is moderately edematous, pale.  Mucus is clear,  slightly blood on the right.  Mild tenderness over frontal sinus.  OP without erythema or lesions Neck: no lymphadenopathy or mass Heart: regular rate and rhythm without murmur Lungs: clear bilaterally Abdomen: soft, nontender, no mass.  Normal bowel sounds Extremities: no edema, 2+ pulse Neuro: alert and oriented, cranial nerves intact, normal gait Psych: normal mood, affect   ASSESSMENT/PLAN:  Acute upper respiratory infections of unspecified site  Allergic rhinitis, cause unspecified  Nonallergic vasomotor rhinitis  Diarrhea  Elevated BP   URI vs allergies.  Seems to have hx which suggests URI, viral, but exam is consistent with allergies.  Has some rhinitis related to eating as well.  Reassured there is no evidence of infection.  Might have some frontal sinus pressure, but no acute infection. Restart flonase and zyrtec.  Use mucinex (guaifenesin) as needed to keep secretions thin.  Avoid decongestants/sinus medications which can raise blood pressure.  I recommend trial of sinus rinses--either Neti-pot or Sinus Rinse Kit.  Do this once or twice daily.  BP is elevated--no h/o HTN.  +salty diet (canned foods, chinese).  Reviewed low sodium diet.  Has BP monitor at home--to check and bring list and monitor to visit in 1-2 weeks.  Avoid dairy products for at least 5 days.  Consider using probiotics (such as Align, vs yogurt form, like Activia).  Eat bland foods, avoid spicy, greasy, acidic foods, which might make your stools worse.

## 2013-09-15 DIAGNOSIS — M25559 Pain in unspecified hip: Secondary | ICD-10-CM | POA: Diagnosis not present

## 2013-09-23 DIAGNOSIS — M25559 Pain in unspecified hip: Secondary | ICD-10-CM | POA: Diagnosis not present

## 2013-10-26 DIAGNOSIS — M545 Low back pain, unspecified: Secondary | ICD-10-CM | POA: Diagnosis not present

## 2013-10-26 DIAGNOSIS — M1610 Unilateral primary osteoarthritis, unspecified hip: Secondary | ICD-10-CM | POA: Diagnosis not present

## 2013-10-26 DIAGNOSIS — M169 Osteoarthritis of hip, unspecified: Secondary | ICD-10-CM | POA: Diagnosis not present

## 2013-11-04 ENCOUNTER — Telehealth: Payer: Self-pay

## 2013-11-04 NOTE — Telephone Encounter (Signed)
Pt  moved here from [place] last September.    NEW PATIENT  Medication and allergies:  Reviewed and updated  90 day supply/mail order: n/a Local pharmacy:  [id number] - [place], [place] - [address]   Immunizations due:  Tdap, PNA, Shingles   A/P: No changes to personal, family history or past surgical hx PAP- "1 1/2 years ago"--normal per patient CCS- "1 1/2 years ago"---normal per patient MMG- "3 years ago"---normal per patient Flu- did not receive Tdap- greater than 10 years ago PNA- has never received, DUE Shingles- has never received, DUE  To Discuss with Provider: Needs refill on estrogen, conjugated,-medroxyprogesterone (PREMPRO) 0.3-1.5 MG per tablet

## 2013-11-04 NOTE — Telephone Encounter (Signed)
Spoke with patient and she confirmed her appointment for Tuesday.  Was unable to complete Pre Visit call at this time.  She is currently on vacation and was driving at the time of call.  She stated that she would call back in 2 hours once they have reached their destination.     New patient

## 2013-11-08 ENCOUNTER — Ambulatory Visit (INDEPENDENT_AMBULATORY_CARE_PROVIDER_SITE_OTHER): Payer: Medicare Other | Admitting: Family Medicine

## 2013-11-08 ENCOUNTER — Encounter: Payer: Self-pay | Admitting: Family Medicine

## 2013-11-08 VITALS — BP 142/80 | HR 88 | Temp 98.6°F | Ht 65.0 in | Wt 148.8 lb

## 2013-11-08 DIAGNOSIS — K5900 Constipation, unspecified: Secondary | ICD-10-CM | POA: Diagnosis not present

## 2013-11-08 DIAGNOSIS — M549 Dorsalgia, unspecified: Secondary | ICD-10-CM | POA: Diagnosis not present

## 2013-11-08 DIAGNOSIS — K219 Gastro-esophageal reflux disease without esophagitis: Secondary | ICD-10-CM | POA: Insufficient documentation

## 2013-11-08 DIAGNOSIS — K5909 Other constipation: Secondary | ICD-10-CM | POA: Insufficient documentation

## 2013-11-08 MED ORDER — RANITIDINE HCL 150 MG PO TABS: 150.0000 mg | ORAL_TABLET | Freq: Two times a day (BID) | ORAL | Status: DC

## 2013-11-08 MED ORDER — OXYCODONE-ACETAMINOPHEN 5-325 MG PO TABS: 1.0000 | ORAL_TABLET | Freq: Four times a day (QID) | ORAL | Status: DC | PRN

## 2013-11-08 MED ORDER — CONJ ESTROG-MEDROXYPROGEST ACE 0.3-1.5 MG PO TABS: 1.0000 | ORAL_TABLET | Freq: Every day | ORAL | Status: DC

## 2013-11-08 NOTE — Progress Notes (Signed)
Subjective:    Patient ID: Brandi [name], female    DOB: 1946/01/06, 68 y.o.   MRN: [id number]  HPI Pt here to establish and get refills.  No complaints.      Review of Systems Review of Systems  Constitutional: Negative for activity change, appetite change and fatigue.  HENT: Negative for hearing loss, congestion, tinnitus and ear discharge.  dentist q23m Eyes: Negative for visual disturbance (see optho q1y -- vision corrected to 20/20 with glasses).  Respiratory: Negative for cough, chest tightness and shortness of breath.   Cardiovascular: Negative for chest pain, palpitations and leg swelling.  Gastrointestinal: Negative for abdominal pain, diarrhea, constipation and abdominal distention.  Genitourinary: Negative for urgency, frequency, decreased urine volume and difficulty urinating.  Musculoskeletal: Negative for back pain, arthralgias and gait problem.  Skin: Negative for color change, pallor and rash.  Neurological: Negative for dizziness, light-headedness, numbness and headaches.  Hematological: Negative for adenopathy. Does not bruise/bleed easily.  Psychiatric/Behavioral: Negative for suicidal ideas, confusion, sleep disturbance, self-injury, dysphoric mood, decreased concentration and agitation.     Past Medical History  Diagnosis Date  . Osteoarthritis   . Chronic back pain   . History of gastric ulcer   . Constipation   . Anesthesia complication     years ago, but no problems with several surgeries since  . Shoulder pain    History   Social History  . Marital Status: Married    Spouse Name: N/A    Number of Children: N/A  . Years of Education: N/A   Occupational History  . retired    Social History Main Topics  . Smoking status: Never Smoker   . Smokeless tobacco: Never Used  . Alcohol Use: No  . Drug Use: No  . Sexual Activity: Yes   Other Topics Concern  . Not on file   Social History Narrative   Retired from [employer] in [place], exercise - walking    retired Environmental manager, Musician, librarian   Family History  Problem Relation Age of Onset  . Cancer Mother     colon cancer  . Cancer Father     colon cancer  . Heart disease Neg Hx   . Cancer Sister 24    died of colon cancer   Current Outpatient Prescriptions  Medication Sig Dispense Refill  . clindamycin (CLEOCIN) 300 MG capsule Take 300 mg by mouth every 6 (six) hours.      Marland Kitchen estrogen, conjugated,-medroxyprogesterone (PREMPRO) 0.3-1.5 MG per tablet Take 1 tablet by mouth daily.  90 tablet  3  . lubiprostone (AMITIZA) 24 MCG capsule Take 1 capsule (24 mcg total) by mouth 2 (two) times daily with a meal.  60 capsule  2  . triamcinolone (NASACORT ALLERGY 24HR) 55 MCG/ACT AERO nasal inhaler Place 2 sprays into the nose daily.      Marland Kitchen oxyCODONE-acetaminophen (PERCOCET) 5-325 MG per tablet Take 1 tablet by mouth every 6 (six) hours as needed for severe pain. Written by Dr Desiree Lucy in [place] [facility]  120 tablet  0  . ranitidine (ZANTAC) 150 MG tablet Take 1 tablet (150 mg total) by mouth 2 (two) times daily.  180 tablet  3   No current facility-administered medications for this visit.       Objective:   Physical Exam  BP 142/80  Pulse 88  Temp(Src) 98.6 F (37 C) (Oral)  Ht 5\' 5"  (1.651 m)  Wt 148 lb 12.8 oz (67.495 kg)  BMI 24.76  kg/m2  SpO2 97% General appearance: alert, cooperative, appears stated age and no distress Nose: Nares normal. Septum midline. Mucosa normal. No drainage or sinus tenderness. Throat: lips, mucosa, and tongue normal; teeth and gums normal Neck: no adenopathy, supple, symmetrical, trachea midline and thyroid not enlarged, symmetric, no tenderness/mass/nodules Lungs: clear to auscultation bilaterally Heart: regular rate and rhythm, S1, S2 normal, no murmur, click, rub or gallop Abdomen: soft, non-tender; bowel sounds normal; no masses,  no organomegaly Extremities: extremities normal, atraumatic, no cyanosis or edema        Assessment & Plan:  1. GERD (gastroesophageal reflux disease) stable  - ranitidine (ZANTAC) 150 MG tablet; Take 1 tablet (150 mg total) by mouth 2 (two) times daily.  Dispense: 180 tablet; Refill: 3  2. Back pain Pt sees pain management in [place] (PERCOCET) 5-325 MG per tablet; Take 1 tablet by mouth every 6 (six) hours as needed for severe pain. Written by Dr Desiree Lucy in [place] [facility]  Dispense: 120 tablet; Refill: 0  3. Chronic constipation On amitiza F/u GI

## 2013-11-08 NOTE — Progress Notes (Signed)
Pre visit review using our clinic review tool, if applicable. No additional management support is needed unless otherwise documented below in the visit note. 

## 2013-11-08 NOTE — Patient Instructions (Signed)

## 2013-11-10 ENCOUNTER — Telehealth: Payer: Self-pay | Admitting: *Deleted

## 2013-11-10 NOTE — Telephone Encounter (Signed)
Caller name:  Jake Relation to pt:  self Call back number: [phone] [facility] [address]  Reason for call: [facility] need [facility] for estrogen, conjugated,-medroxyprogesterone (PREMPRO) 0.3-1.5 MG per tablet.  She request [place] to contact [facility] and ask for them to refax the prior authorization if that is what is needed.

## 2013-11-11 NOTE — Telephone Encounter (Signed)
Patient called stating that her pharmacy sent over a prior authorization for Prempro and has not heard from [place]. Also patient states that she really needs this to be approved soon. Please advise.

## 2013-11-14 NOTE — Telephone Encounter (Signed)
Prior authorization initiated. Awaiting response. JG//CMA

## 2013-11-29 DIAGNOSIS — M25579 Pain in unspecified ankle and joints of unspecified foot: Secondary | ICD-10-CM | POA: Diagnosis not present

## 2013-11-29 DIAGNOSIS — M19079 Primary osteoarthritis, unspecified ankle and foot: Secondary | ICD-10-CM | POA: Diagnosis not present

## 2013-11-30 ENCOUNTER — Other Ambulatory Visit: Payer: Self-pay | Admitting: Orthopedic Surgery

## 2013-11-30 DIAGNOSIS — M79606 Pain in leg, unspecified: Secondary | ICD-10-CM

## 2013-11-30 DIAGNOSIS — R609 Edema, unspecified: Secondary | ICD-10-CM

## 2013-12-01 ENCOUNTER — Inpatient Hospital Stay: Admission: RE | Admit: 2013-12-01 | Payer: 59 | Source: Ambulatory Visit

## 2013-12-01 ENCOUNTER — Encounter: Payer: Self-pay | Admitting: Physical Medicine & Rehabilitation

## 2013-12-07 ENCOUNTER — Ambulatory Visit
Admission: RE | Admit: 2013-12-07 | Discharge: 2013-12-07 | Disposition: A | Discharge status: Home / Self Care | Payer: Medicare Other | Source: Ambulatory Visit | Attending: Orthopedic Surgery | Admitting: Orthopedic Surgery

## 2013-12-07 DIAGNOSIS — R609 Edema, unspecified: Secondary | ICD-10-CM

## 2013-12-07 DIAGNOSIS — M7989 Other specified soft tissue disorders: Secondary | ICD-10-CM | POA: Diagnosis not present

## 2013-12-07 DIAGNOSIS — M79609 Pain in unspecified limb: Secondary | ICD-10-CM | POA: Diagnosis not present

## 2013-12-07 DIAGNOSIS — M79606 Pain in leg, unspecified: Secondary | ICD-10-CM

## 2013-12-29 NOTE — Telephone Encounter (Signed)
Completed.//AB/CMA 

## 2014-01-02 ENCOUNTER — Telehealth: Payer: Self-pay | Admitting: Family Medicine

## 2014-01-02 NOTE — Telephone Encounter (Signed)
Caller name: Evani Relation to pt: self  Call back number: [phone] Pharmacy: [facility]  Reason for call:  Pt is needing [place] to call the insurance company regarding Rx estrogen, conjugated,-medroxyprogesterone (PREMPRO) 0.3-1.5 MG per tablet so they can get it filled.  Pharmacy said they sent it 4 times.  WIll resend it again today.

## 2014-01-05 NOTE — Telephone Encounter (Signed)
Advised patient that information has been sent.

## 2014-01-05 NOTE — Telephone Encounter (Signed)
Pt called back. Just needs the insurance called.  Please call pt when complete.

## 2014-01-11 NOTE — Telephone Encounter (Signed)
Caller name:Kendallyn Relation to ER:XVQM Call back [place]:  Reason for call: pt states her insurance company advised her the PA request was not received. She would like for you to re-fax the paperwork and mark is "URGENT".

## 2014-01-11 NOTE — Telephone Encounter (Signed)
Message routed to [facility], [place]. Please advise.

## 2014-01-13 ENCOUNTER — Telehealth: Payer: Self-pay | Admitting: Family Medicine

## 2014-01-13 NOTE — Telephone Encounter (Signed)
[phone]   [employer] ID# [id number]  Please review previous messages.  Pt is getting frustrated about this.

## 2014-01-16 NOTE — Telephone Encounter (Signed)
Have spoken with Medicare Part D. They will be faxing questions to complete. QHUT#[id number] Advised patient

## 2014-01-16 NOTE — Telephone Encounter (Signed)
Called patient to inquire of the medications that she has tried in the past for the condition. States that she has been on [facility] since 2001. Has not tried any additional therapies.  Form given to Dr Etter Sjogren for review.

## 2014-01-17 NOTE — Telephone Encounter (Signed)
Prior Auth request for Prempro 0.3/1.5mg  has been faxed to [place] for approval.  Awaiting response.//AB/CMA

## 2014-01-19 NOTE — Telephone Encounter (Signed)
Received letter from [employer]) that prior authorization was denied.  Placed in folder for Dr. Etter Sjogren to give a recommendation.//AB/CMA

## 2014-01-19 NOTE — Telephone Encounter (Signed)
Spoke with the pt and informed her of the denial letter, which she was aware of.  Pt voiced her frustration regarding getting her Prempro rx.  Informed the pt that I will be making Dr. Etter Sjogren aware of the denial letter, and to get her recommendation.  Informed her that the insurance company sent a form to do an appeal for the Prempro, and the pt agreed.  Pt will be bring a receipt of her purchase of the Prempro to send with the appeal.//AB/CMA

## 2014-01-26 NOTE — Telephone Encounter (Signed)
Received signed Appeal letter from Dr. Etter Sjogren   All forms faxed to [place] at [phone]).  Confirmation received.  Awaiting response.//AB/CMA

## 2014-01-30 ENCOUNTER — Ambulatory Visit: Payer: 59 | Admitting: Physical Medicine & Rehabilitation

## 2014-02-06 NOTE — Telephone Encounter (Signed)
Received completed and signed Appeal form from Dr. Etter Sjogren.   Form faxed to [place] at [phone]).  Confirmation received.  Awaiting approval.//AB/CMA

## 2014-02-16 ENCOUNTER — Ambulatory Visit: Payer: Medicare Other | Admitting: Family Medicine

## 2014-02-17 ENCOUNTER — Ambulatory Visit (INDEPENDENT_AMBULATORY_CARE_PROVIDER_SITE_OTHER): Payer: Medicare Other | Admitting: Family Medicine

## 2014-02-17 ENCOUNTER — Encounter: Payer: Self-pay | Admitting: Family Medicine

## 2014-02-17 VITALS — BP 118/82 | HR 76 | Temp 99.6°F | Wt 142.9 lb

## 2014-02-17 DIAGNOSIS — G894 Chronic pain syndrome: Secondary | ICD-10-CM | POA: Diagnosis not present

## 2014-02-17 DIAGNOSIS — Z780 Asymptomatic menopausal state: Secondary | ICD-10-CM

## 2014-02-17 DIAGNOSIS — N951 Menopausal and female climacteric states: Secondary | ICD-10-CM | POA: Diagnosis not present

## 2014-02-17 DIAGNOSIS — G4700 Insomnia, unspecified: Secondary | ICD-10-CM | POA: Diagnosis not present

## 2014-02-17 MED ORDER — OXYCODONE-ACETAMINOPHEN 5-325 MG PO TABS: 1.0000 | ORAL_TABLET | Freq: Four times a day (QID) | ORAL | Status: DC | PRN

## 2014-02-17 MED ORDER — ZALEPLON 5 MG PO CAPS: 5.0000 mg | ORAL_CAPSULE | Freq: Every evening | ORAL | Status: DC | PRN

## 2014-02-17 MED ORDER — ESTRADIOL-NORETHINDRONE ACET 0.05-0.14 MG/DAY TD PTTW: 1.0000 | MEDICATED_PATCH | TRANSDERMAL | Status: DC

## 2014-02-17 MED ORDER — DICLOFENAC SODIUM 1 % TD GEL: 2.0000 g | Freq: Four times a day (QID) | TRANSDERMAL | Status: DC

## 2014-02-17 NOTE — Patient Instructions (Signed)
Menopause Menopause is the normal time of life when menstrual periods stop completely. Menopause is complete when you have missed 12 consecutive menstrual periods. It usually occurs between the ages of 48 years and 55 years. Very rarely does a woman develop menopause before the age of 40 years. At menopause, your ovaries stop producing the female hormones estrogen and progesterone. This can cause undesirable symptoms and also affect your health. Sometimes the symptoms may occur 4-5 years before the menopause begins. There is no relationship between menopause and:  Oral contraceptives.  Number of children you had.  Race.  The age your menstrual periods started (menarche). Heavy smokers and very thin women may develop menopause earlier in life. CAUSES  The ovaries stop producing the female hormones estrogen and progesterone.  Other causes include:  Surgery to remove both ovaries.  The ovaries stop functioning for no known reason.  Tumors of the pituitary gland in the brain.  Medical disease that affects the ovaries and hormone production.  Radiation treatment to the abdomen or pelvis.  Chemotherapy that affects the ovaries. SYMPTOMS   Hot flashes.  Night sweats.  Decrease in sex drive.  Vaginal dryness and thinning of the vagina causing painful intercourse.  Dryness of the skin and developing wrinkles.  Headaches.  Tiredness.  Irritability.  Memory problems.  Weight gain.  Bladder infections.  Hair growth of the face and chest.  Infertility. More serious symptoms include:  Loss of bone (osteoporosis) causing breaks (fractures).  Depression.  Hardening and narrowing of the arteries (atherosclerosis) causing heart attacks and strokes. DIAGNOSIS   When the menstrual periods have stopped for 12 straight months.  Physical exam.  Hormone studies of the blood. TREATMENT  There are many treatment choices and nearly as many questions about them. The  decisions to treat or not to treat menopausal changes is an individual choice made with your health care provider. Your health care provider can discuss the treatments with you. Together, you can decide which treatment will work best for you. Your treatment choices may include:   Hormone therapy (estrogen and progesterone).  Non-hormonal medicines.  Treating the individual symptoms with medicine (for example antidepressants for depression).  Herbal medicines that may help specific symptoms.  Counseling by a psychiatrist or psychologist.  Group therapy.  Lifestyle changes including:  Eating healthy.  Regular exercise.  Limiting caffeine and alcohol.  Stress management and meditation.  No treatment. HOME CARE INSTRUCTIONS   Take the medicine your health care provider gives you as directed.  Get plenty of sleep and rest.  Exercise regularly.  Eat a diet that contains calcium (good for the bones) and soy products (acts like estrogen hormone).  Avoid alcoholic beverages.  Do not smoke.  If you have hot flashes, dress in layers.  Take supplements, calcium, and vitamin D to strengthen bones.  You can use over-the-counter lubricants or moisturizers for vaginal dryness.  Group therapy is sometimes very helpful.  Acupuncture may be helpful in some cases. SEEK MEDICAL CARE IF:   You are not sure you are in menopause.  You are having menopausal symptoms and need advice and treatment.  You are still having menstrual periods after age 55 years.  You have pain with intercourse.  Menopause is complete (no menstrual period for 12 months) and you develop vaginal bleeding.  You need a referral to a specialist (gynecologist, psychiatrist, or psychologist) for treatment. SEEK IMMEDIATE MEDICAL CARE IF:   You have severe depression.  You have excessive vaginal bleeding.    You fell and think you have a broken bone.  You have pain when you urinate.  You develop leg or  chest pain.  You have a fast pounding heart beat (palpitations).  You have severe headaches.  You develop vision problems.  You feel a lump in your breast.  You have abdominal pain or severe indigestion. Document Released: 08/23/2003 Document Revised: 02/02/2013 Document Reviewed: 12/30/2012 [employer] Patient Information 2015 [employer]. This information is not intended to replace advice given to you by your health care provider. Make sure you discuss any questions you have with your health care provider.  

## 2014-02-17 NOTE — Progress Notes (Signed)
Pre visit review using our clinic review tool, if applicable. No additional management support is needed unless otherwise documented below in the visit note. 

## 2014-02-18 NOTE — Progress Notes (Signed)
   Subjective:    Patient ID: Brandi [name], female    DOB: 05/09/1946, 68 y.o.   MRN: [id number]  HPI Pt is here to discuss prempro --- her insurance will not pay for it.  We are in the process of appealing decision but in the meantime she needs something else.  Pt also needs a refill on pain meds for OA and she is struggling with sleep.  She can fall asleep but then wakes up in middle of night and can not fall back asleep.      Review of Systems As abovel     Objective:   Physical Exam  BP 118/82  Pulse 76  Temp(Src) 99.6 F (37.6 C) (Oral)  Wt 142 lb 13.7 oz (64.8 kg)  SpO2 96% General appearance: alert, cooperative and no distress Lungs: clear to auscultation bilaterally Heart: regular rate and rhythm, S1, S2 normal, no murmur, click, rub or gallop       Assessment & Plan:  1. Menopause Try different rx -- we will con't to try to get prempro for her in meantime - estradiol-norethindrone (COMBIPATCH) 0.05-0.14 MG/DAY; Place 1 patch onto the skin 2 (two) times a week.  Dispense: 8 patch; Refill: 12  2. Chronic pain syndrome Refill meds - diclofenac sodium (VOLTAREN) 1 % GEL; Apply 2 g topically 4 (four) times daily.  Dispense: 100 g; Refill: 1 - oxyCODONE-acetaminophen (PERCOCET) 5-325 MG per tablet; Take 1 tablet by mouth every 6 (six) hours as needed for severe pain.  Dispense: 120 tablet; Refill: 0  3. Insomnia Pt doesn't need sleep med every night or for full night.  Wanted something she could take if she woke up in middle of night and couldn't get back to sleep - zaleplon (SONATA) 5 MG capsule; Take 1 capsule (5 mg total) by mouth at bedtime as needed for sleep.  Dispense: 30 capsule; Refill: 0

## 2014-02-28 ENCOUNTER — Ambulatory Visit: Payer: 59 | Admitting: Physical Medicine & Rehabilitation

## 2014-03-02 ENCOUNTER — Ambulatory Visit (INDEPENDENT_AMBULATORY_CARE_PROVIDER_SITE_OTHER): Payer: Medicare Other | Admitting: Family Medicine

## 2014-03-02 ENCOUNTER — Encounter: Payer: Self-pay | Admitting: Family Medicine

## 2014-03-02 VITALS — BP 176/92 | HR 101 | Temp 98.7°F | Wt 155.9 lb

## 2014-03-02 DIAGNOSIS — M25559 Pain in unspecified hip: Secondary | ICD-10-CM | POA: Diagnosis not present

## 2014-03-02 DIAGNOSIS — IMO0001 Reserved for inherently not codable concepts without codable children: Secondary | ICD-10-CM

## 2014-03-02 DIAGNOSIS — R030 Elevated blood-pressure reading, without diagnosis of hypertension: Secondary | ICD-10-CM | POA: Diagnosis not present

## 2014-03-02 DIAGNOSIS — G4700 Insomnia, unspecified: Secondary | ICD-10-CM | POA: Diagnosis not present

## 2014-03-02 MED ORDER — TEMAZEPAM 30 MG PO CAPS: ORAL_CAPSULE | ORAL | Status: DC

## 2014-03-02 NOTE — Patient Instructions (Signed)
Insomnia Insomnia is frequent trouble falling and/or staying asleep. Insomnia can be a long term problem or a short term problem. Both are common. Insomnia can be a short term problem when the wakefulness is related to a certain stress or worry. Long term insomnia is often related to ongoing stress during waking hours and/or poor sleeping habits. Overtime, sleep deprivation itself can make the problem worse. Every little thing feels more severe because you are overtired and your ability to cope is decreased. CAUSES   Stress, anxiety, and depression.  Poor sleeping habits.  Distractions such as TV in the bedroom.  Naps close to bedtime.  Engaging in emotionally charged conversations before bed.  Technical reading before sleep.  Alcohol and other sedatives. They may make the problem worse. They can hurt normal sleep patterns and normal dream activity.  Stimulants such as caffeine for several hours prior to bedtime.  Pain syndromes and shortness of breath can cause insomnia.  Exercise late at night.  Changing time zones may cause sleeping problems (jet lag). It is sometimes helpful to have someone observe your sleeping patterns. They should look for periods of not breathing during the night (sleep apnea). They should also look to see how long those periods last. If you live alone or observers are uncertain, you can also be observed at a sleep clinic where your sleep patterns will be professionally monitored. Sleep apnea requires a checkup and treatment. Give your caregivers your medical history. Give your caregivers observations your family has made about your sleep.  SYMPTOMS   Not feeling rested in the morning.  Anxiety and restlessness at bedtime.  Difficulty falling and staying asleep. TREATMENT   Your caregiver may prescribe treatment for an underlying medical disorders. Your caregiver can give advice or help if you are using alcohol or other drugs for self-medication. Treatment  of underlying problems will usually eliminate insomnia problems.  Medications can be prescribed for short time use. They are generally not recommended for lengthy use.  Over-the-counter sleep medicines are not recommended for lengthy use. They can be habit forming.  You can promote easier sleeping by making lifestyle changes such as:  Using relaxation techniques that help with breathing and reduce muscle tension.  Exercising earlier in the day.  Changing your diet and the time of your last meal. No night time snacks.  Establish a regular time to go to bed.  Counseling can help with stressful problems and worry.  Soothing music and white noise may be helpful if there are background noises you cannot remove.  Stop tedious detailed work at least one hour before bedtime. HOME CARE INSTRUCTIONS   Keep a diary. Inform your caregiver about your progress. This includes any medication side effects. See your caregiver regularly. Take note of:  Times when you are asleep.  Times when you are awake during the night.  The quality of your sleep.  How you feel the next day. This information will help your caregiver care for you.  Get out of bed if you are still awake after 15 minutes. Read or do some quiet activity. Keep the lights down. Wait until you feel sleepy and go back to bed.  Keep regular sleeping and waking hours. Avoid naps.  Exercise regularly.  Avoid distractions at bedtime. Distractions include watching television or engaging in any intense or detailed activity like attempting to balance the household checkbook.  Develop a bedtime ritual. Keep a familiar routine of bathing, brushing your teeth, climbing into bed at the same   time each night, listening to soothing music. Routines increase the success of falling to sleep faster.  Use relaxation techniques. This can be using breathing and muscle tension release routines. It can also include visualizing peaceful scenes. You can  also help control troubling or intruding thoughts by keeping your mind occupied with boring or repetitive thoughts like the old concept of counting sheep. You can make it more creative like imagining planting one beautiful flower after another in your backyard garden.  During your day, work to eliminate stress. When this is not possible use some of the previous suggestions to help reduce the anxiety that accompanies stressful situations. MAKE SURE YOU:   Understand these instructions.  Will watch your condition.  Will get help right away if you are not doing well or get worse. Document Released: 05/30/2000 Document Revised: 08/25/2011 Document Reviewed: 06/30/2007 ExitCare Patient Information 2015 [employer]. This information is not intended to replace advice given to you by your health care provider. Make sure you discuss any questions you have with your health care provider.  

## 2014-03-02 NOTE — Progress Notes (Signed)
   Subjective:    Patient ID: Brandi [name], female    DOB: 06/13/1946, 68 y.o.   MRN: [id number]  HPI Pt here to discuss HRT.  Ins co denied patch as well.    Review of Systems    as above Objective:   Physical Exam  BP 176/92  Pulse 101  Temp(Src) 98.7 F (37.1 C) (Rectal)  Wt 155 lb 13.8 oz (70.7 kg)  SpO2 98% General appearance: alert, cooperative, appears stated age and no distress Neck: no adenopathy, supple, symmetrical, trachea midline and thyroid not enlarged, symmetric, no tenderness/mass/nodules Lungs: clear to auscultation bilaterally Heart: S1, S2 normal      Assessment & Plan:  1. Insomnia  - temazepam (RESTORIL) 30 MG capsule; 1 po qhs prn sleep  Dispense: 30 capsule; Refill: 0  2. Elevated BP Pt is on cold meds and decongestants--- recheck in 2-3 weeks  3. Menopause/ hot flashes---- we need to call Ins co to figure out what they will pay for

## 2014-03-06 ENCOUNTER — Telehealth: Payer: Self-pay | Admitting: Family Medicine

## 2014-03-06 NOTE — Telephone Encounter (Signed)
noted 

## 2014-03-06 NOTE — Telephone Encounter (Signed)
C/o:  Blood in stool. Described as greenish, black stools since Saturday night.  According to patient, can smell blood in stool. Stool consistency ranges from formed to liquid.  Denies abdominal pain or cramping.  + Heartburn.  Taking Zantac.  Burning sensation relieved with Zantac.  Lack of appetite.  Eating prompts bowel movements.  Feels weak.  Been in bed x 3 days.  Dizziness upon standing.    Hx. GERD, stomach ulcer,  Benign polys, family history-colon cancer, and chronic constipation.    Last colonoscopy 2 years ago in [place].    Advice: Due to weakness and dizziness patient was encouraged to go to urgent care today.  Pt stated that she would rather wait and see Dr. Etter [name] tomorrow at 1:15pm.  She was advised that if her symptoms worsen, to go to ER.  Pt stated understanding and agreed.

## 2014-03-06 NOTE — Telephone Encounter (Signed)
Would you like the patient triaged, she has an apt tomorrow.     KP

## 2014-03-06 NOTE — Telephone Encounter (Signed)
Yes please

## 2014-03-06 NOTE — Telephone Encounter (Signed)
Caller name: Chrislyn  Relation to pt: self  Call back number: [phone] Pharmacy:  Reason for call:   pt scheduled an acute appointment with you 03/07/14 due to blood in her stool for the past 3 day. Pt is not expereicing any pain. Please advise

## 2014-03-07 ENCOUNTER — Ambulatory Visit (INDEPENDENT_AMBULATORY_CARE_PROVIDER_SITE_OTHER): Payer: Medicare Other | Admitting: Family Medicine

## 2014-03-07 ENCOUNTER — Encounter: Payer: Self-pay | Admitting: Family Medicine

## 2014-03-07 VITALS — BP 168/98 | HR 96 | Temp 98.8°F | Wt 146.8 lb

## 2014-03-07 DIAGNOSIS — R634 Abnormal weight loss: Secondary | ICD-10-CM

## 2014-03-07 DIAGNOSIS — G8929 Other chronic pain: Secondary | ICD-10-CM | POA: Diagnosis not present

## 2014-03-07 DIAGNOSIS — K625 Hemorrhage of anus and rectum: Secondary | ICD-10-CM | POA: Diagnosis not present

## 2014-03-07 DIAGNOSIS — K219 Gastro-esophageal reflux disease without esophagitis: Secondary | ICD-10-CM | POA: Diagnosis not present

## 2014-03-07 DIAGNOSIS — Z79899 Other long term (current) drug therapy: Secondary | ICD-10-CM | POA: Diagnosis not present

## 2014-03-07 LAB — BASIC METABOLIC PANEL
BUN: 6 mg/dL (ref 6–23)
CHLORIDE: 107 meq/L (ref 96–112)
CO2: 23 meq/L (ref 19–32)
Calcium: 9.4 mg/dL (ref 8.4–10.5)
Creatinine, Ser: 1.1 mg/dL (ref 0.4–1.2)
GFR: 66.62 mL/min (ref >60.00)
Glucose, Bld: 82 mg/dL (ref 70–99)
Potassium: 3.3 mEq/L — ABNORMAL LOW (ref 3.5–5.1)
Sodium: 138 mEq/L (ref 135–145)

## 2014-03-07 LAB — CBC WITH DIFFERENTIAL/PLATELET
BASOS ABS: 0 10*3/uL (ref 0.0–0.1)
BASOS PCT: 0.4 % (ref 0.0–3.0)
EOS ABS: 0.1 10*3/uL (ref 0.0–0.7)
Eosinophils Relative: 1.3 % (ref 0.0–5.0)
HCT: 41.3 % (ref 36.0–46.0)
Hemoglobin: 13.7 g/dL (ref 12.0–15.0)
Lymphocytes Relative: 15.8 % (ref 12.0–46.0)
Lymphs Abs: 1.6 10*3/uL (ref 0.7–4.0)
MCHC: 33.1 g/dL (ref 30.0–36.0)
MCV: 98.1 fl (ref 78.0–100.0)
MONO ABS: 0.4 10*3/uL (ref 0.1–1.0)
Monocytes Relative: 4.3 % (ref 3.0–12.0)
NEUTROS ABS: 8 10*3/uL — AB (ref 1.4–7.7)
NEUTROS PCT: 78.2 % — AB (ref 43.0–77.0)
Platelets: 378 10*3/uL (ref 150.0–400.0)
RBC: 4.21 Mil/uL (ref 3.87–5.11)
RDW: 14.3 % (ref 11.5–15.5)
WBC: 10.3 10*3/uL (ref 4.0–10.5)

## 2014-03-07 LAB — HEPATIC FUNCTION PANEL
ALBUMIN: 4.3 g/dL (ref 3.5–5.2)
ALT: 13 U/L (ref 0–35)
AST: 15 U/L (ref 0–37)
Alkaline Phosphatase: 66 U/L (ref 39–117)
BILIRUBIN DIRECT: 0 mg/dL (ref 0.0–0.3)
TOTAL PROTEIN: 8.4 g/dL — AB (ref 6.0–8.3)
Total Bilirubin: 0.5 mg/dL (ref 0.2–1.2)

## 2014-03-07 LAB — H. PYLORI ANTIBODY, IGG: H Pylori IgG: NEGATIVE

## 2014-03-07 MED ORDER — GI COCKTAIL ~~LOC~~
30.0000 mL | Freq: Once | ORAL | Status: AC
  Administered 2014-03-07: 30 mL via ORAL

## 2014-03-07 MED ORDER — DROSPIRENONE-ESTRADIOL 0.5-1 MG PO TABS: 1.0000 | ORAL_TABLET | Freq: Every day | ORAL | Status: DC

## 2014-03-07 MED ORDER — OXYCODONE-ACETAMINOPHEN 7.5-325 MG PO TABS: 1.0000 | ORAL_TABLET | ORAL | Status: DC | PRN

## 2014-03-07 MED ORDER — OMEPRAZOLE 40 MG PO CPDR: 40.0000 mg | DELAYED_RELEASE_CAPSULE | Freq: Every day | ORAL | Status: DC

## 2014-03-07 NOTE — Telephone Encounter (Addendum)
Received notice of denial for Prempro 0.3/1.5mg  via fax from [employer] [facility] ([facility] [employer]  Called and spoke with representative at [employer] and she stated if we still wanted to do an appeal we will need to fill out an request for reconsideration of Medicare prescription drug denial. The request will need to go to [employer].  Requested form was completed and placed in folder for Dr. Etter Sjogren to sign.  Dr. Etter Sjogren recommend to check and see what alternative meds the pt's insurance will cover.  Kim called [employer] and was told that Medicare will not cover any menopause medications.  But was informed that her 2nd insurance will pick up 2 alternative meds:Activella 0.5mg  #28,Angeliq 0.5mg /1mg  #28.  Dr. Nonda Lou recommendation was to try the Angeliq 0.5/1mg .  Pt was informed of medication change and agreed.  New rx was ordered and sent to the pt's pharmacy.//AB/CMA

## 2014-03-07 NOTE — Addendum Note (Signed)
Addended by: Harl Bowie on: 03/07/2014 07:57 PM   Modules accepted: Orders

## 2014-03-07 NOTE — Patient Instructions (Signed)
Rectal Bleeding °Rectal bleeding is when blood passes out of the anus. It is usually a sign that something is wrong. It may not be serious, but it should always be evaluated. Rectal bleeding may present as bright red blood or extremely dark stools. The color may range from dark red or maroon to black (like tar). It is important that the cause of rectal bleeding be identified so treatment can be started and the problem corrected. °CAUSES  °· Hemorrhoids. These are enlarged (dilated) blood vessels or veins in the anal or rectal area. °· Fistulas. These are abnormal, burrowing channels that usually run from inside the rectum to the skin around the anus. They can bleed. °· Anal fissures. This is a tear in the tissue of the anus. Bleeding occurs with bowel movements. °· Diverticulosis. This is a condition in which pockets or sacs project from the bowel wall. Occasionally, the sacs can bleed. °· Diverticulitis. This is an infection involving diverticulosis of the colon. °· Proctitis and colitis. These are conditions in which the rectum, colon, or both, can become inflamed and pitted (ulcerated). °· Polyps and cancer. Polyps are non-cancerous (benign) growths in the colon that may bleed. Certain types of polyps turn into cancer. °· Protrusion of the rectum. Part of the rectum can project from the anus and bleed. °· Certain medicines. °· Intestinal infections. °· Blood vessel abnormalities. °HOME CARE INSTRUCTIONS °· Eat a high-fiber diet to keep your stool soft. °· Limit activity. °· Drink enough fluids to keep your urine clear or pale yellow. °· Warm baths may be useful to soothe rectal pain. °· Follow up with your caregiver as directed. °SEEK IMMEDIATE MEDICAL CARE IF: °· You develop increased bleeding. °· You have black or dark red stools. °· You vomit blood or material that looks like coffee grounds. °· You have abdominal pain or tenderness. °· You have a fever. °· You feel weak, nauseous, or you faint. °· You have  severe rectal pain or you are unable to have a bowel movement. °MAKE SURE YOU: °· Understand these instructions. °· Will watch your condition. °· Will get help right away if you are not doing well or get worse. °Document Released: 11/22/2001 Document Revised: 08/25/2011 Document Reviewed: 11/17/2010 °ExitCare® Patient Information ©2015 [employer]. This information is not intended to replace advice given to you by your health care provider. Make sure you discuss any questions you have with your health care provider. ° °

## 2014-03-07 NOTE — Progress Notes (Signed)
   Subjective:    Patient ID: Brandi [name], female    DOB: 04/10/1946, 68 y.o.   MRN: [id number]  HPI Pt here c/o blood in stool for the last week.  No abd pain but + heartburn.   9 lb weight loss in last week.  Pt states she just has dec appetite.     Review of Systemst As above    Objective:   Physical Exam BP 168/98  Pulse 96  Temp(Src) 98.8 F (37.1 C) (Oral)  Wt 146 lb 12.8 oz (66.588 kg)  SpO2 99% General appearance: alert, cooperative, appears stated age and mild distress Abdomen: soft, non-tender; bowel sounds normal; no masses,  no organomegaly Rectal-- heme + brown stool       Assessment & Plan:  1. Chronic pain  - oxyCODONE-acetaminophen (PERCOCET) 7.5-325 MG per tablet; Take 1 tablet by mouth every 4 (four) hours as needed for pain.  Dispense: 120 tablet; Refill: 0  2. Gastroesophageal reflux disease without esophagitis Refer to GI - omeprazole (PRILOSEC) 40 MG capsule; Take 1 capsule (40 mg total) by mouth daily.  Dispense: 30 capsule; Refill: 3 - Basic metabolic panel - CBC with Differential - H. pylori antibody, IgG - Hepatic function panel  3. Loss of weight  - Ambulatory referral to Gastroenterology - Basic metabolic panel - CBC with Differential - H. pylori antibody, IgG - Hepatic function panel  4. Rectal bleeding  - Ambulatory referral to Gastroenterology

## 2014-03-07 NOTE — Progress Notes (Signed)
Pre visit review using our clinic review tool, if applicable. No additional management support is needed unless otherwise documented below in the visit note. 

## 2014-03-07 NOTE — Addendum Note (Signed)
Addended by: Ewing Schlein on: 03/07/2014 03:28 PM   Modules accepted: Orders

## 2014-03-13 ENCOUNTER — Ambulatory Visit (INDEPENDENT_AMBULATORY_CARE_PROVIDER_SITE_OTHER): Payer: Medicare Other | Admitting: Family Medicine

## 2014-03-13 ENCOUNTER — Encounter: Payer: Self-pay | Admitting: Family Medicine

## 2014-03-13 VITALS — BP 155/101 | HR 134 | Temp 98.9°F | Wt 141.8 lb

## 2014-03-13 DIAGNOSIS — R002 Palpitations: Secondary | ICD-10-CM

## 2014-03-13 DIAGNOSIS — I10 Essential (primary) hypertension: Secondary | ICD-10-CM

## 2014-03-13 MED ORDER — METOPROLOL SUCCINATE ER 50 MG PO TB24
50.0000 mg | ORAL_TABLET | Freq: Every day | ORAL | Status: DC
Start: 2014-03-13 — End: 2014-04-10

## 2014-03-13 NOTE — Progress Notes (Signed)
Pre visit review using our clinic review tool, if applicable. No additional management support is needed unless otherwise documented below in the visit note. 

## 2014-03-13 NOTE — Progress Notes (Signed)
  Subjective:    Patient here for follow-up of elevated blood pressure.  She is not exercising and is not adherent to a low-salt diet.  Blood pressure is not well controlled at home. Cardiac symptoms: fatigue and palpitations. Patient denies: chest pain, chest pressure/discomfort, claudication, dyspnea, exertional chest pressure/discomfort, lower extremity edema, near-syncope, orthopnea, paroxysmal nocturnal dyspnea, syncope and tachypnea. Cardiovascular risk factors: hypertension and sedentary lifestyle. Use of agents associated with hypertension: decongestants. History of target organ damage: none.  The following portions of the patient's history were reviewed and updated as appropriate: allergies, current medications, past family history, past medical history, past social history, past surgical history and problem list.  Review of Systems Pertinent items are noted in HPI.     Objective:    BP 155/101  Pulse 134  Temp(Src) 98.9 F (37.2 C) (Oral)  Wt 141 lb 12.1 oz (64.3 kg)  SpO2 98% General appearance: alert, cooperative, appears stated age and no distress Throat: lips, mucosa, and tongue normal; teeth and gums normal Neck: no adenopathy, supple, symmetrical, trachea midline and thyroid not enlarged, symmetric, no tenderness/mass/nodules Lungs: clear to auscultation bilaterally Heart: S1, S2 normal Extremities: extremities normal, atraumatic, no cyanosis or edema    Assessment:    Hypertension, elevated. Evidence of target organ damage: none.    Plan:    Medication: begin toprol. Dietary sodium restriction. Regular aerobic exercise. Follow up: 3 weeks and as needed.

## 2014-03-13 NOTE — Patient Instructions (Signed)

## 2014-03-20 ENCOUNTER — Other Ambulatory Visit (HOSPITAL_COMMUNITY): Payer: Medicare Other

## 2014-03-20 NOTE — Telephone Encounter (Signed)
See telephone encounter for (01/13/14).//AB/CMA

## 2014-03-21 DIAGNOSIS — K922 Gastrointestinal hemorrhage, unspecified: Secondary | ICD-10-CM | POA: Diagnosis not present

## 2014-03-22 ENCOUNTER — Encounter: Payer: Self-pay | Admitting: Family Medicine

## 2014-03-27 ENCOUNTER — Ambulatory Visit: Payer: Medicare Other | Admitting: Physician Assistant

## 2014-03-30 DIAGNOSIS — M25552 Pain in left hip: Secondary | ICD-10-CM | POA: Diagnosis not present

## 2014-04-10 ENCOUNTER — Encounter: Payer: Self-pay | Admitting: Physician Assistant

## 2014-04-10 ENCOUNTER — Ambulatory Visit (INDEPENDENT_AMBULATORY_CARE_PROVIDER_SITE_OTHER): Payer: Medicare Other | Admitting: Physician Assistant

## 2014-04-10 VITALS — BP 172/111 | HR 104 | Temp 99.5°F | Resp 16 | Ht 65.0 in | Wt 142.4 lb

## 2014-04-10 DIAGNOSIS — G4700 Insomnia, unspecified: Secondary | ICD-10-CM | POA: Diagnosis not present

## 2014-04-10 DIAGNOSIS — G8929 Other chronic pain: Secondary | ICD-10-CM

## 2014-04-10 DIAGNOSIS — R000 Tachycardia, unspecified: Secondary | ICD-10-CM | POA: Insufficient documentation

## 2014-04-10 DIAGNOSIS — I10 Essential (primary) hypertension: Secondary | ICD-10-CM | POA: Diagnosis not present

## 2014-04-10 MED ORDER — METOPROLOL SUCCINATE ER 50 MG PO TB24: 100.0000 mg | ORAL_TABLET | Freq: Every day | ORAL | Status: DC

## 2014-04-10 MED ORDER — TEMAZEPAM 30 MG PO CAPS: ORAL_CAPSULE | ORAL | Status: DC

## 2014-04-10 MED ORDER — OXYCODONE-ACETAMINOPHEN 7.5-325 MG PO TABS: 1.0000 | ORAL_TABLET | ORAL | Status: DC | PRN

## 2014-04-10 NOTE — Assessment & Plan Note (Signed)
Will obtain thyroid studies.  Toprol increased to 100 mg QD. Follow-up with PCP in 2 weeks.

## 2014-04-10 NOTE — Assessment & Plan Note (Signed)
Uncontrolled. Reiterated DASH diet.  Handout given. Toprol XL increased to 100 mg QD.  Home BP measurements encouraged.  Bring to next visit. Will obtain TSH and T4.  Follow-up with PCP in 2 weeks.  Another medication will likely be needed as HTN is stage II.

## 2014-04-10 NOTE — Progress Notes (Signed)
Pre visit review using our clinic review tool, if applicable. No additional management support is needed unless otherwise documented below in the visit note/SLS  

## 2014-04-10 NOTE — Progress Notes (Signed)
Patient presents to clinic today for follow-up of tachycardia and hypertension.  Patient placed on Toprol XL 50 mg QD by her PCP at last visit.  EKG at that time revealed sinus rhythm.  No labs obtained.  Patient endorses taking medication most days.  Has taken daily over the past week.  Denies chest pain, palpitations, lightheadedness, vision change or headache.  BP in clinic is 172/111. Pulse at 104.  Past Medical History  Diagnosis Date  . Osteoarthritis   . Chronic back pain   . History of gastric ulcer   . Constipation   . Anesthesia complication     years ago, but no problems with several surgeries since  . Shoulder pain   . Hypertension     Current Outpatient Prescriptions on File Prior to Visit  Medication Sig Dispense Refill  . diclofenac sodium (VOLTAREN) 1 % GEL Apply 2 g topically 4 (four) times daily.  100 g  1  . drospirenone-estradiol (ANGELIQ) 0.5-1 MG per tablet Take 1 tablet by mouth daily.  28 tablet  11  . lubiprostone (AMITIZA) 24 MCG capsule Take 1 capsule (24 mcg total) by mouth 2 (two) times daily with a meal.  60 capsule  2  . omeprazole (PRILOSEC) 40 MG capsule Take 1 capsule (40 mg total) by mouth daily.  30 capsule  3  . ranitidine (ZANTAC) 150 MG tablet Take 1 tablet (150 mg total) by mouth 2 (two) times daily.  180 tablet  3   No current facility-administered medications on file prior to visit.    Allergies  Allergen Reactions  . Aspirin     Family History  Problem Relation Age of Onset  . Cancer Mother     colon cancer  . Cancer Father     colon cancer  . Heart disease Neg Hx   . Cancer Sister 74    died of colon cancer    History   Social History  . Marital Status: Married    Spouse Name: N/A    Number of Children: N/A  . Years of Education: N/A   Occupational History  . retired    Social History Main Topics  . Smoking status: Never Smoker   . Smokeless tobacco: Never Used  . Alcohol Use: No  . Drug Use: No  . Sexual  Activity: Yes   Other Topics Concern  . None   Social History Narrative   Retired from [employer] in [place], exercise - walking   retired Environmental manager, Musician, librarian    Review of Systems - See HPI.  All other ROS are negative.  BP 172/111  Pulse 104  Temp(Src) 99.5 F (37.5 C) (Oral)  Resp 16  Ht 5\' 5"  (1.651 m)  Wt 142 lb 6 oz (64.581 kg)  BMI 23.69 kg/m2  SpO2 99%  Physical Exam  Vitals reviewed. Constitutional: She is oriented to person, place, and time and well-developed, well-nourished, and in no distress.  HENT:  Head: Normocephalic and atraumatic.  Eyes: Conjunctivae are normal.  Neck: Neck supple. No thyromegaly present.  Cardiovascular: Regular rhythm, normal heart sounds and intact distal pulses.   Mildly tachycardic.  Pulmonary/Chest: Effort normal and breath sounds normal. No respiratory distress. She has no wheezes. She has no rales. She exhibits no tenderness.  Neurological: She is alert and oriented to person, place, and time.  Skin: Skin is warm and dry. No rash noted.  Psychiatric: Affect normal.    Recent Results (from the past  2160 hour(s))  BASIC METABOLIC PANEL     Status: Abnormal   Collection Time    03/07/14  2:05 PM      Result Value Ref Range   Sodium 138  135 - 145 mEq/L   Potassium 3.3 (*) 3.5 - 5.1 mEq/L   Chloride 107  96 - 112 mEq/L   CO2 23  19 - 32 mEq/L   Glucose, Bld 82  70 - 99 mg/dL   BUN 6  6 - 23 mg/dL   Creatinine, Ser 1.1  0.4 - 1.2 mg/dL   Calcium 9.4  8.4 - 10.5 mg/dL   GFR 66.62  >60.00 mL/min  CBC WITH DIFFERENTIAL     Status: Abnormal   Collection Time    03/07/14  2:05 PM      Result Value Ref Range   WBC 10.3  4.0 - 10.5 K/uL   RBC 4.21  3.87 - 5.11 Mil/uL   Hemoglobin 13.7  12.0 - 15.0 g/dL   HCT 41.3  36.0 - 46.0 %   MCV 98.1  78.0 - 100.0 fl   MCHC 33.1  30.0 - 36.0 g/dL   RDW 14.3  11.5 - 15.5 %   Platelets 378.0  150.0 - 400.0 K/uL   Neutrophils Relative % 78.2 (*) 43.0 - 77.0 %     Lymphocytes Relative 15.8  12.0 - 46.0 %   Monocytes Relative 4.3  3.0 - 12.0 %   Eosinophils Relative 1.3  0.0 - 5.0 %   Basophils Relative 0.4  0.0 - 3.0 %   Neutro Abs 8.0 (*) 1.4 - 7.7 K/uL   Lymphs Abs 1.6  0.7 - 4.0 K/uL   Monocytes Absolute 0.4  0.1 - 1.0 K/uL   Eosinophils Absolute 0.1  0.0 - 0.7 K/uL   Basophils Absolute 0.0  0.0 - 0.1 K/uL  H. PYLORI ANTIBODY, IGG     Status: None   Collection Time    03/07/14  2:05 PM      Result Value Ref Range   H Pylori IgG Negative  Negative  HEPATIC FUNCTION PANEL     Status: Abnormal   Collection Time    03/07/14  2:05 PM      Result Value Ref Range   Total Bilirubin 0.5  0.2 - 1.2 mg/dL   Bilirubin, Direct 0.0  0.0 - 0.3 mg/dL   Alkaline Phosphatase 66  39 - 117 U/L   AST 15  0 - 37 U/L   ALT 13  0 - 35 U/L   Total Protein 8.4 (*) 6.0 - 8.3 g/dL   Albumin 4.3  3.5 - 5.2 g/dL    Assessment/Plan: Essential hypertension, benign Uncontrolled. Reiterated DASH diet.  Handout given. Toprol XL increased to 100 mg QD.  Home BP measurements encouraged.  Bring to next visit. Will obtain TSH and T4.  Follow-up with PCP in 2 weeks.  Another medication will likely be needed as HTN is stage II.  Tachycardia Will obtain thyroid studies.  Toprol increased to 100 mg QD. Follow-up with PCP in 2 weeks.

## 2014-04-10 NOTE — Patient Instructions (Signed)
Please increase your Toprol to 2 tablets (100 mg) once daily. This will help heart rate and lower your blood pressure.  Continue all other medications, taking as directed.  Read information below on the DASH diet.  Return to lab for thyroid check.  Follow-up with Dr. Sherrine Maples in 2 week.  Another medication will likely be needed for your blood pressure.  DASH Eating Plan DASH stands for "Dietary Approaches to Stop Hypertension." The DASH eating plan is a healthy eating plan that has been shown to reduce high blood pressure (hypertension). Additional health benefits may include reducing the risk of type 2 diabetes mellitus, heart disease, and stroke. The DASH eating plan may also help with weight loss. WHAT DO I NEED TO KNOW ABOUT THE DASH EATING PLAN? For the DASH eating plan, you will follow these general guidelines:  Choose foods with a percent daily value for sodium of less than 5% (as listed on the food label).  Use salt-free seasonings or herbs instead of table salt or sea salt.  Check with your health care provider or pharmacist before using salt substitutes.  Eat lower-sodium products, often labeled as "lower sodium" or "no salt added."  Eat fresh foods.  Eat more vegetables, fruits, and low-fat dairy products.  Choose whole grains. Look for the word "whole" as the first word in the ingredient list.  Choose fish and skinless chicken or [place] more often than red meat. Limit fish, poultry, and meat to 6 oz (170 g) each day.  Limit sweets, desserts, sugars, and sugary drinks.  Choose heart-healthy fats.  Limit cheese to 1 oz (28 g) per day.  Eat more home-cooked food and less restaurant, buffet, and fast food.  Limit fried foods.  Cook foods using methods other than frying.  Limit canned vegetables. If you do use them, rinse them well to decrease the sodium.  When eating at a restaurant, ask that your food be prepared with less salt, or no salt if possible. WHAT FOODS CAN I  EAT? Seek help from a dietitian for individual calorie needs. Grains Whole grain or whole wheat bread. Brown rice. Whole grain or whole wheat pasta. Quinoa, bulgur, and whole grain cereals. Low-sodium cereals. Corn or whole wheat flour tortillas. Whole grain cornbread. Whole grain crackers. Low-sodium crackers. Vegetables Fresh or frozen vegetables (raw, steamed, roasted, or grilled). Low-sodium or reduced-sodium tomato and vegetable juices. Low-sodium or reduced-sodium tomato sauce and paste. Low-sodium or reduced-sodium canned vegetables.  Fruits All fresh, canned (in natural juice), or frozen fruits. Meat and Other Protein Products Ground beef (85% or leaner), grass-fed beef, or beef trimmed of fat. Skinless chicken or [place]. Ground chicken or [place]. Pork trimmed of fat. All fish and seafood. Eggs. Dried beans, peas, or lentils. Unsalted nuts and seeds. Unsalted canned beans. Dairy Low-fat dairy products, such as skim or 1% milk, 2% or reduced-fat cheeses, low-fat ricotta or cottage cheese, or plain low-fat yogurt. Low-sodium or reduced-sodium cheeses. Fats and Oils Tub margarines without trans fats. Light or reduced-fat mayonnaise and salad dressings (reduced sodium). Avocado. Safflower, olive, or canola oils. Natural peanut or almond butter. Other Unsalted popcorn and pretzels. The items listed above may not be a complete list of recommended foods or beverages. Contact your dietitian for more options. WHAT FOODS ARE NOT RECOMMENDED? Grains White bread. White pasta. White rice. Refined cornbread. Bagels and croissants. Crackers that contain trans fat. Vegetables Creamed or fried vegetables. Vegetables in a cheese sauce. Regular canned vegetables. Regular canned tomato sauce and paste. Regular  tomato and vegetable juices. Fruits Dried fruits. Canned fruit in light or heavy syrup. Fruit juice. Meat and Other Protein Products Fatty cuts of meat. Ribs, chicken wings, bacon, sausage,  bologna, salami, chitterlings, fatback, hot dogs, bratwurst, and packaged luncheon meats. Salted nuts and seeds. Canned beans with salt. Dairy Whole or 2% milk, cream, half-and-half, and cream cheese. Whole-fat or sweetened yogurt. Full-fat cheeses or blue cheese. Nondairy creamers and whipped toppings. Processed cheese, cheese spreads, or cheese curds. Condiments Onion and garlic salt, seasoned salt, table salt, and sea salt. Canned and packaged gravies. Worcestershire sauce. Tartar sauce. Barbecue sauce. Teriyaki sauce. Soy sauce, including reduced sodium. Steak sauce. Fish sauce. Oyster sauce. Cocktail sauce. Horseradish. Ketchup and mustard. Meat flavorings and tenderizers. Bouillon cubes. Hot sauce. Tabasco sauce. Marinades. Taco seasonings. Relishes. Fats and Oils Butter, stick margarine, lard, shortening, ghee, and bacon fat. Coconut, palm kernel, or palm oils. Regular salad dressings. Other Pickles and olives. Salted popcorn and pretzels. The items listed above may not be a complete list of foods and beverages to avoid. Contact your dietitian for more information. WHERE CAN I FIND MORE INFORMATION? National Heart, [employer], and [employer] Institute: travelstabloid.com Document Released: 05/22/2011 Document Revised: 10/17/2013 Document Reviewed: 04/06/2013 [facility] Patient Information 2015 [place], [place]. This information is not intended to replace advice given to you by your health care provider. Make sure you discuss any questions you have with your health care provider.

## 2014-04-14 DIAGNOSIS — K2980 Duodenitis without bleeding: Secondary | ICD-10-CM | POA: Diagnosis not present

## 2014-04-14 DIAGNOSIS — K900 Celiac disease: Secondary | ICD-10-CM | POA: Diagnosis not present

## 2014-04-14 DIAGNOSIS — K922 Gastrointestinal hemorrhage, unspecified: Secondary | ICD-10-CM | POA: Diagnosis not present

## 2014-04-14 DIAGNOSIS — K219 Gastro-esophageal reflux disease without esophagitis: Secondary | ICD-10-CM | POA: Diagnosis not present

## 2014-04-14 DIAGNOSIS — K2901 Acute gastritis with bleeding: Secondary | ICD-10-CM | POA: Diagnosis not present

## 2014-04-14 DIAGNOSIS — Z5181 Encounter for therapeutic drug level monitoring: Secondary | ICD-10-CM | POA: Diagnosis not present

## 2014-04-14 DIAGNOSIS — K2950 Unspecified chronic gastritis without bleeding: Secondary | ICD-10-CM | POA: Diagnosis not present

## 2014-04-14 DIAGNOSIS — B9681 Helicobacter pylori [H. pylori] as the cause of diseases classified elsewhere: Secondary | ICD-10-CM | POA: Diagnosis not present

## 2014-05-02 ENCOUNTER — Ambulatory Visit: Payer: Medicare Other | Admitting: Family Medicine

## 2014-05-15 ENCOUNTER — Other Ambulatory Visit: Payer: Self-pay | Admitting: Family Medicine

## 2014-05-15 DIAGNOSIS — G8929 Other chronic pain: Secondary | ICD-10-CM

## 2014-05-15 DIAGNOSIS — G4700 Insomnia, unspecified: Secondary | ICD-10-CM

## 2014-05-15 MED ORDER — TEMAZEPAM 30 MG PO CAPS: ORAL_CAPSULE | ORAL | Status: DC

## 2014-05-15 MED ORDER — OXYCODONE-ACETAMINOPHEN 7.5-325 MG PO TABS: 1.0000 | ORAL_TABLET | ORAL | Status: DC | PRN

## 2014-05-15 NOTE — Telephone Encounter (Signed)
Caller name: Maizey, Menendez Relation to SF:SELT  Call back number: [phone]   Reason for call:   Pt requesting a refill oxyCODONE-acetaminophen (PERCOCET) 7.5-325 MG per tablet and temazepam (RESTORIL) 30 MG capsule. Pt is going out of town and will call to follow up.

## 2014-05-15 NOTE — Telephone Encounter (Signed)
Last seen 03/13/14 and filled Oxy 04/10/14 #120 Restoril 04/10/14 #30  No UDS  Please advise    KP

## 2014-05-29 DIAGNOSIS — M25552 Pain in left hip: Secondary | ICD-10-CM | POA: Diagnosis not present

## 2014-06-06 ENCOUNTER — Telehealth: Payer: Self-pay | Admitting: Family Medicine

## 2014-06-06 DIAGNOSIS — G8929 Other chronic pain: Secondary | ICD-10-CM

## 2014-06-06 DIAGNOSIS — G4700 Insomnia, unspecified: Secondary | ICD-10-CM

## 2014-06-06 MED ORDER — OXYCODONE-ACETAMINOPHEN 7.5-325 MG PO TABS: 1.0000 | ORAL_TABLET | ORAL | Status: DC | PRN

## 2014-06-06 NOTE — Telephone Encounter (Signed)
Too early

## 2014-06-06 NOTE — Telephone Encounter (Signed)
She stated she was leaving to go out of town for the holidays, she is leaving tomorrow. Please advise.      KP

## 2014-06-06 NOTE — Telephone Encounter (Signed)
Last seen 03/13/14 and filled 05/15/14 #120 No UDS  Please advise     KP

## 2014-06-06 NOTE — Telephone Encounter (Signed)
Ok to print and post date

## 2014-06-06 NOTE — Telephone Encounter (Signed)
Caller name:Bautch Denis Relation to TM:YTRZ Call back [place]:  Reason for call: pt is needing rx oxyCODONE-acetaminophen (PERCOCET) 7.5-325 MG per  And temazepam (RESTORIL) 30 MG capsule  Please call when available for pick up

## 2014-06-06 NOTE — Telephone Encounter (Signed)
Patient aware Rx ready for pick up.      KP 

## 2014-06-12 MED ORDER — TEMAZEPAM 30 MG PO CAPS: ORAL_CAPSULE | ORAL | Status: DC

## 2014-06-12 NOTE — Addendum Note (Signed)
Addended by: Ewing Schlein on: 06/12/2014 11:57 AM   Modules accepted: Orders

## 2014-06-15 ENCOUNTER — Inpatient Hospital Stay (HOSPITAL_BASED_OUTPATIENT_CLINIC_OR_DEPARTMENT_OTHER)
Admission: EM | Admit: 2014-06-15 | Discharge: 2014-06-19 | DRG: 378 | Disposition: A | Discharge status: Home / Self Care | Payer: Medicare Other | Attending: Internal Medicine | Admitting: Internal Medicine

## 2014-06-15 ENCOUNTER — Encounter (HOSPITAL_BASED_OUTPATIENT_CLINIC_OR_DEPARTMENT_OTHER): Payer: Self-pay | Admitting: *Deleted

## 2014-06-15 ENCOUNTER — Ambulatory Visit (INDEPENDENT_AMBULATORY_CARE_PROVIDER_SITE_OTHER): Payer: Medicare Other | Admitting: Family Medicine

## 2014-06-15 ENCOUNTER — Emergency Department (HOSPITAL_BASED_OUTPATIENT_CLINIC_OR_DEPARTMENT_OTHER): Payer: Medicare Other

## 2014-06-15 ENCOUNTER — Encounter: Payer: Self-pay | Admitting: Family Medicine

## 2014-06-15 VITALS — BP 131/82 | HR 107 | Temp 98.5°F | Wt 137.0 lb

## 2014-06-15 DIAGNOSIS — M549 Dorsalgia, unspecified: Secondary | ICD-10-CM | POA: Diagnosis present

## 2014-06-15 DIAGNOSIS — R634 Abnormal weight loss: Secondary | ICD-10-CM | POA: Diagnosis present

## 2014-06-15 DIAGNOSIS — K219 Gastro-esophageal reflux disease without esophagitis: Secondary | ICD-10-CM | POA: Diagnosis present

## 2014-06-15 DIAGNOSIS — K760 Fatty (change of) liver, not elsewhere classified: Secondary | ICD-10-CM | POA: Diagnosis present

## 2014-06-15 DIAGNOSIS — E860 Dehydration: Secondary | ICD-10-CM | POA: Diagnosis present

## 2014-06-15 DIAGNOSIS — I951 Orthostatic hypotension: Secondary | ICD-10-CM

## 2014-06-15 DIAGNOSIS — Z800 Family history of malignant neoplasm of digestive organs: Secondary | ICD-10-CM | POA: Diagnosis not present

## 2014-06-15 DIAGNOSIS — K921 Melena: Secondary | ICD-10-CM | POA: Diagnosis present

## 2014-06-15 DIAGNOSIS — G8929 Other chronic pain: Secondary | ICD-10-CM | POA: Diagnosis present

## 2014-06-15 DIAGNOSIS — R451 Restlessness and agitation: Secondary | ICD-10-CM | POA: Diagnosis present

## 2014-06-15 DIAGNOSIS — N281 Cyst of kidney, acquired: Secondary | ICD-10-CM | POA: Diagnosis not present

## 2014-06-15 DIAGNOSIS — K625 Hemorrhage of anus and rectum: Secondary | ICD-10-CM

## 2014-06-15 DIAGNOSIS — Z8711 Personal history of peptic ulcer disease: Secondary | ICD-10-CM | POA: Diagnosis not present

## 2014-06-15 DIAGNOSIS — E876 Hypokalemia: Secondary | ICD-10-CM | POA: Diagnosis present

## 2014-06-15 DIAGNOSIS — G4700 Insomnia, unspecified: Secondary | ICD-10-CM | POA: Diagnosis not present

## 2014-06-15 DIAGNOSIS — N179 Acute kidney failure, unspecified: Secondary | ICD-10-CM | POA: Diagnosis present

## 2014-06-15 DIAGNOSIS — R112 Nausea with vomiting, unspecified: Secondary | ICD-10-CM | POA: Diagnosis not present

## 2014-06-15 DIAGNOSIS — K922 Gastrointestinal hemorrhage, unspecified: Principal | ICD-10-CM | POA: Diagnosis present

## 2014-06-15 DIAGNOSIS — R197 Diarrhea, unspecified: Secondary | ICD-10-CM | POA: Diagnosis not present

## 2014-06-15 DIAGNOSIS — K210 Gastro-esophageal reflux disease with esophagitis: Secondary | ICD-10-CM

## 2014-06-15 DIAGNOSIS — Z86010 Personal history of colonic polyps: Secondary | ICD-10-CM | POA: Diagnosis not present

## 2014-06-15 DIAGNOSIS — E878 Other disorders of electrolyte and fluid balance, not elsewhere classified: Secondary | ICD-10-CM | POA: Diagnosis present

## 2014-06-15 DIAGNOSIS — I10 Essential (primary) hypertension: Secondary | ICD-10-CM | POA: Diagnosis present

## 2014-06-15 DIAGNOSIS — K5900 Constipation, unspecified: Secondary | ICD-10-CM | POA: Diagnosis present

## 2014-06-15 DIAGNOSIS — R410 Disorientation, unspecified: Secondary | ICD-10-CM

## 2014-06-15 DIAGNOSIS — K5909 Other constipation: Secondary | ICD-10-CM | POA: Diagnosis present

## 2014-06-15 DIAGNOSIS — K449 Diaphragmatic hernia without obstruction or gangrene: Secondary | ICD-10-CM | POA: Diagnosis present

## 2014-06-15 DIAGNOSIS — R58 Hemorrhage, not elsewhere classified: Secondary | ICD-10-CM

## 2014-06-15 LAB — CBC WITH DIFFERENTIAL/PLATELET
BASOS ABS: 0 10*3/uL (ref 0.0–0.1)
Basophils Relative: 0 % (ref 0–1)
EOS PCT: 1 % (ref 0–5)
Eosinophils Absolute: 0.1 10*3/uL (ref 0.0–0.7)
HEMATOCRIT: 36.3 % (ref 36.0–46.0)
HEMOGLOBIN: 12.1 g/dL (ref 12.0–15.0)
LYMPHS ABS: 1.7 10*3/uL (ref 0.7–4.0)
LYMPHS PCT: 16 % (ref 12–46)
MCH: 32.9 pg (ref 26.0–34.0)
MCHC: 33.3 g/dL (ref 30.0–36.0)
MCV: 98.6 fL (ref 78.0–100.0)
MONO ABS: 0.9 10*3/uL (ref 0.1–1.0)
Monocytes Relative: 8 % (ref 3–12)
Neutro Abs: 8.3 10*3/uL — ABNORMAL HIGH (ref 1.7–7.7)
Neutrophils Relative %: 75 % (ref 43–77)
Platelets: 408 10*3/uL — ABNORMAL HIGH (ref 150–400)
RBC: 3.68 MIL/uL — AB (ref 3.87–5.11)
RDW: 13.1 % (ref 11.5–15.5)
WBC: 11 10*3/uL — ABNORMAL HIGH (ref 4.0–10.5)

## 2014-06-15 LAB — COMPREHENSIVE METABOLIC PANEL
ALT: 14 U/L (ref 0–35)
ANION GAP: 13 (ref 5–15)
AST: 24 U/L (ref 0–37)
Albumin: 4 g/dL (ref 3.5–5.2)
Alkaline Phosphatase: 73 U/L (ref 39–117)
BILIRUBIN TOTAL: 0.3 mg/dL (ref 0.3–1.2)
BUN: 14 mg/dL (ref 6–23)
CO2: 41 mmol/L (ref 19–32)
Calcium: 9.2 mg/dL (ref 8.4–10.5)
Chloride: 82 mEq/L — ABNORMAL LOW (ref 96–112)
Creatinine, Ser: 1.46 mg/dL — ABNORMAL HIGH (ref 0.50–1.10)
GFR calc Af Amer: 42 mL/min — ABNORMAL LOW (ref >90)
GFR, EST NON AFRICAN AMERICAN: 36 mL/min — AB (ref >90)
Glucose, Bld: 103 mg/dL — ABNORMAL HIGH (ref 70–99)
Potassium: 2.2 mmol/L — CL (ref 3.5–5.1)
SODIUM: 136 mmol/L (ref 135–145)
Total Protein: 7.2 g/dL (ref 6.0–8.3)

## 2014-06-15 LAB — URINALYSIS, ROUTINE W REFLEX MICROSCOPIC
Bilirubin Urine: NEGATIVE
Glucose, UA: NEGATIVE mg/dL
HGB URINE DIPSTICK: NEGATIVE
KETONES UR: NEGATIVE mg/dL
NITRITE: NEGATIVE
PH: 8 (ref 5.0–8.0)
PROTEIN: NEGATIVE mg/dL
Specific Gravity, Urine: 1.018 (ref 1.005–1.030)
Urobilinogen, UA: 0.2 mg/dL (ref 0.0–1.0)

## 2014-06-15 LAB — URINE MICROSCOPIC-ADD ON

## 2014-06-15 MED ORDER — OXYCODONE-ACETAMINOPHEN 5-325 MG PO TABS
1.0000 | ORAL_TABLET | ORAL | Status: DC | PRN
  Administered 2014-06-16 – 2014-06-19 (×7): 1 via ORAL
  Filled 2014-06-15 (×8): qty 1

## 2014-06-15 MED ORDER — POTASSIUM CHLORIDE 10 MEQ/100ML IV SOLN
10.0000 meq | Freq: Once | INTRAVENOUS | Status: AC
  Administered 2014-06-15: 10 meq via INTRAVENOUS
  Filled 2014-06-15: qty 100

## 2014-06-15 MED ORDER — SODIUM CHLORIDE 0.9 % IV BOLUS (SEPSIS)
1000.0000 mL | Freq: Once | INTRAVENOUS | Status: AC
  Administered 2014-06-16: 1000 mL via INTRAVENOUS

## 2014-06-15 MED ORDER — IOHEXOL 300 MG/ML  SOLN
25.0000 mL | Freq: Once | INTRAMUSCULAR | Status: AC | PRN
  Administered 2014-06-15: 25 mL via ORAL

## 2014-06-15 MED ORDER — TEMAZEPAM 15 MG PO CAPS
30.0000 mg | ORAL_CAPSULE | Freq: Every evening | ORAL | Status: DC | PRN
  Filled 2014-06-15: qty 2

## 2014-06-15 MED ORDER — LUBIPROSTONE 24 MCG PO CAPS
24.0000 ug | ORAL_CAPSULE | Freq: Two times a day (BID) | ORAL | Status: DC
  Administered 2014-06-17 – 2014-06-19 (×3): 24 ug via ORAL
  Filled 2014-06-15 (×9): qty 1

## 2014-06-15 MED ORDER — FAMOTIDINE 20 MG PO TABS
20.0000 mg | ORAL_TABLET | Freq: Two times a day (BID) | ORAL | Status: DC
  Administered 2014-06-16 – 2014-06-19 (×6): 20 mg via ORAL
  Filled 2014-06-15 (×9): qty 1

## 2014-06-15 MED ORDER — ONDANSETRON HCL 4 MG/2ML IJ SOLN
4.0000 mg | Freq: Once | INTRAMUSCULAR | Status: DC
  Filled 2014-06-15: qty 2

## 2014-06-15 MED ORDER — IOHEXOL 300 MG/ML  SOLN
80.0000 mL | Freq: Once | INTRAMUSCULAR | Status: AC | PRN
  Administered 2014-06-15: 80 mL via INTRAVENOUS

## 2014-06-15 MED ORDER — HYDROMORPHONE HCL 1 MG/ML IJ SOLN
1.0000 mg | Freq: Once | INTRAMUSCULAR | Status: AC
  Administered 2014-06-15: 1 mg via INTRAVENOUS
  Filled 2014-06-15: qty 1

## 2014-06-15 MED ORDER — TRAZODONE HCL 50 MG PO TABS: ORAL_TABLET | ORAL | Status: DC

## 2014-06-15 MED ORDER — DICLOFENAC SODIUM 1 % TD GEL: 2.0000 g | Freq: Four times a day (QID) | TRANSDERMAL | Status: DC

## 2014-06-15 MED ORDER — OXYCODONE HCL 5 MG PO TABS
2.5000 mg | ORAL_TABLET | ORAL | Status: DC | PRN
Start: 2014-06-15 — End: 2014-06-19
  Administered 2014-06-16 – 2014-06-19 (×6): 2.5 mg via ORAL
  Filled 2014-06-15 (×6): qty 1

## 2014-06-15 MED ORDER — SODIUM CHLORIDE 0.9 % IJ SOLN: 3.0000 mL | Freq: Two times a day (BID) | INTRAMUSCULAR | Status: DC

## 2014-06-15 MED ORDER — POTASSIUM CHLORIDE CRYS ER 20 MEQ PO TBCR
40.0000 meq | EXTENDED_RELEASE_TABLET | Freq: Once | ORAL | Status: AC
  Administered 2014-06-15: 40 meq via ORAL
  Filled 2014-06-15: qty 2

## 2014-06-15 MED ORDER — SODIUM CHLORIDE 0.9 % IV BOLUS (SEPSIS)
1000.0000 mL | Freq: Once | INTRAVENOUS | Status: AC
  Administered 2014-06-15: 1000 mL via INTRAVENOUS

## 2014-06-15 MED ORDER — OXYCODONE-ACETAMINOPHEN 7.5-325 MG PO TABS: 1.0000 | ORAL_TABLET | ORAL | Status: DC | PRN

## 2014-06-15 MED ORDER — POTASSIUM CHLORIDE 20 MEQ/15ML (10%) PO SOLN
40.0000 meq | Freq: Once | ORAL | Status: DC
  Filled 2014-06-15 (×2): qty 30

## 2014-06-15 MED ORDER — ONDANSETRON HCL 4 MG/2ML IJ SOLN: 4.0000 mg | Freq: Three times a day (TID) | INTRAMUSCULAR | Status: DC | PRN

## 2014-06-15 MED ORDER — PANTOPRAZOLE SODIUM 40 MG IV SOLR
40.0000 mg | Freq: Two times a day (BID) | INTRAVENOUS | Status: DC
  Administered 2014-06-16 – 2014-06-17 (×4): 40 mg via INTRAVENOUS
  Filled 2014-06-15 (×6): qty 40

## 2014-06-15 MED ORDER — TRAZODONE HCL 50 MG PO TABS
50.0000 mg | ORAL_TABLET | Freq: Every evening | ORAL | Status: DC | PRN
  Filled 2014-06-15: qty 1

## 2014-06-15 MED ORDER — DROSPIRENONE-ESTRADIOL 0.5-1 MG PO TABS: 1.0000 | ORAL_TABLET | Freq: Every day | ORAL | Status: DC

## 2014-06-15 MED ORDER — SODIUM CHLORIDE 0.9 % IV SOLN
INTRAVENOUS | Status: DC
  Administered 2014-06-16 (×2): via INTRAVENOUS

## 2014-06-15 MED ORDER — ONDANSETRON HCL 4 MG/2ML IJ SOLN
4.0000 mg | Freq: Once | INTRAMUSCULAR | Status: AC
  Administered 2014-06-15: 4 mg via INTRAVENOUS

## 2014-06-15 NOTE — ED Notes (Signed)
[facility] has been notified of [id number]

## 2014-06-15 NOTE — Progress Notes (Addendum)
NURSING PROGRESS NOTE  AKYLA VAVREK [id number] Admission Data: 06/15/2014 10:59 PM Attending Provider: Ivor Costa, MD WUJ:[id number] Etter Sjogren, DO Code Status: Full  Brandi [name] is a 68 y.o. female patient admitted from ED:  -No acute distress noted.  -No complaints of shortness of breath.  -No complaints of chest pain.   Cardiac Monitoring:  Blood pressure 119/69, pulse 72, temperature 98.2 F (36.8 C), temperature source Oral, resp. rate 18, height 5\' 4"  (1.626 m), weight 59.6 kg (131 lb 6.3 oz), SpO2 100 %.   IV Fluids:  IV in place, occlusive dsg intact without redness, IV cath forearm right, condition patent and no redness normal saline.   Allergies:  Aspirin  Past Medical History:   has a past medical history of Osteoarthritis; Chronic back pain; History of gastric ulcer; Constipation; Anesthesia complication; Shoulder pain; and Hypertension.  Past Surgical History:   has past surgical history that includes Foot surgery; Lumbar fusion; Shoulder surgery; and Root canal (11/01/13).  Social History:   reports that she has never smoked. She has never used smokeless tobacco. She reports that she does not drink alcohol or use illicit drugs.  Skin: rash to arms and back of neck. Some small areas open.  Patient/Family oriented to room. Information packet given to patient/family. Admission inpatient armband information verified with patient/family to include name and date of birth and placed on patient arm. Side rails up x 2, fall assessment and education completed with patient/family. Patient/family able to verbalize understanding of risk associated with falls and verbalized understanding to call for assistance before getting out of bed. Call light within reach. Patient/family able to voice and demonstrate understanding of unit orientation instructions.

## 2014-06-15 NOTE — ED Provider Notes (Signed)
CSN: [id number]     Arrival date & time 06/15/14  1215 History   First MD Initiated Contact with Patient 06/15/14 1411     Chief Complaint  Patient presents with  . Rectal Bleeding     (Consider location/radiation/quality/duration/timing/severity/associated sxs/prior Treatment) Patient is a 68 y.o. female presenting with hematochezia. The history is provided by the patient. No language interpreter was used.  Rectal Bleeding Quality:  Bright red Amount:  Moderate Timing:  Constant Progression:  Worsening Chronicity:  New Context: not constipation, not diarrhea, not hemorrhoids, not rectal injury and not rectal pain   Similar prior episodes: no   Relieved by:  Nothing Worsened by:  Nothing tried Ineffective treatments:  None tried Associated symptoms: abdominal pain   Associated symptoms: no vomiting   Pt had a colonoscopy done in [place] one year ago.  Pt was told she has polyps.  Pt has a family history of colon ca.  Pt went to Dr. Ivy Lynn office today and had stool guiac positive.  Pt sent here due to pain.   Pt also has been losing weight.  Pt reports about 10 pounds in the past week.   Past Medical History  Diagnosis Date  . Osteoarthritis   . Chronic back pain   . History of gastric ulcer   . Constipation   . Anesthesia complication     years ago, but no problems with several surgeries since  . Shoulder pain   . Hypertension    Past Surgical History  Procedure Laterality Date  . Foot surgery    . Lumbar fusion    . Shoulder surgery    . Root canal  11/01/13   Family History  Problem Relation Age of Onset  . Cancer Mother     colon cancer  . Cancer Father     colon cancer  . Heart disease Neg Hx   . Cancer Sister 46    died of colon cancer   History  Substance Use Topics  . Smoking status: Never Smoker   . Smokeless tobacco: Never Used  . Alcohol Use: No   OB History    No data available     Review of Systems  Gastrointestinal: Positive for  abdominal pain and hematochezia. Negative for vomiting.  All other systems reviewed and are negative.     Allergies  Aspirin  Home Medications   Prior to Admission medications   Medication Sig Start Date End Date Taking? Authorizing Provider  diclofenac sodium (VOLTAREN) 1 % GEL Apply 2 g topically 4 (four) times daily. 02/17/14   Rosalita Chessman, DO  drospirenone-estradiol (ANGELIQ) 0.5-1 MG per tablet Take 1 tablet by mouth daily. 03/07/14   Rosalita Chessman, DO  lubiprostone (AMITIZA) 24 MCG capsule Take 1 capsule (24 mcg total) by mouth 2 (two) times daily with a meal. 12/07/12   Camelia Eng Tysinger, PA-C  metoprolol succinate (TOPROL-XL) 50 MG 24 hr tablet Take 2 tablets (100 mg total) by mouth daily. Take with or immediately following a meal. 04/10/14   Brunetta Jeans, PA-C  omeprazole (PRILOSEC) 40 MG capsule Take 1 capsule (40 mg total) by mouth daily. 03/07/14   Rosalita Chessman, DO  oxyCODONE-acetaminophen (PERCOCET) 7.5-325 MG per tablet Take 1 tablet by mouth every 4 (four) hours as needed for pain. 06/06/14   Rosalita Chessman, DO  ranitidine (ZANTAC) 150 MG tablet Take 1 tablet (150 mg total) by mouth 2 (two) times daily. 11/08/13   Rosalita Chessman,  DO  temazepam (RESTORIL) 30 MG capsule 1 po qhs prn sleep 06/12/14   Rosalita Chessman, DO  traZODone (DESYREL) 50 MG tablet 1-2 po qhs prn 06/15/14   Yvonne R Lowne, DO   BP 125/81 mmHg  Pulse 94  Temp(Src) 98.4 F (36.9 C) (Oral)  Resp 18  Ht 5\' 4"  (1.626 m)  Wt 137 lb (62.143 kg)  BMI 23.50 kg/m2  SpO2 99% Physical Exam  Constitutional: She is oriented to person, place, and time. She appears well-developed and well-nourished.  HENT:  Head: Normocephalic and atraumatic.  Right Ear: External ear normal.  Nose: Nose normal.  Mouth/Throat: Oropharynx is clear and moist.  Eyes: Conjunctivae and EOM are normal. Pupils are equal, round, and reactive to light.  Neck: Normal range of motion.  Cardiovascular: Normal rate and normal heart  sounds.   Pulmonary/Chest: Effort normal.  Abdominal: Bowel sounds are normal. She exhibits no distension and no mass. There is tenderness. There is no rebound and no guarding.  Genitourinary: Guaiac positive stool.  Musculoskeletal: Normal range of motion.  Neurological: She is alert and oriented to person, place, and time.  Skin: Skin is warm.  Psychiatric: She has a normal mood and affect.  Nursing note and vitals reviewed.   ED Course  Procedures (including critical care time) Labs Review Labs Reviewed  CBC WITH DIFFERENTIAL - Abnormal; Notable for the following:    WBC 11.0 (*)    RBC 3.68 (*)    Platelets 408 (*)    Neutro Abs 8.3 (*)    All other components within normal limits  COMPREHENSIVE METABOLIC PANEL - Abnormal; Notable for the following:    Potassium 2.2 (*)    Chloride 82 (*)    CO2 41 (*)    Glucose, Bld 103 (*)    Creatinine, Ser 1.46 (*)    GFR calc non Af Amer 36 (*)    GFR calc Af Amer 42 (*)    All other components within normal limits    Imaging Review Ct Abdomen Pelvis W Contrast  06/15/2014   CLINICAL DATA:  Upper abdominal pain and rectal bleeding for the past week. Nausea, vomiting and diarrhea.  EXAM: CT ABDOMEN AND PELVIS WITH CONTRAST  TECHNIQUE: Multidetector CT imaging of the abdomen and pelvis was performed using the standard protocol following bolus administration of intravenous contrast.  CONTRAST:  70mL OMNIPAQUE IOHEXOL 300 MG/ML SOLN, 17mL OMNIPAQUE IOHEXOL 300 MG/ML SOLN  COMPARISON:  None.  FINDINGS: Mild diffuse low density of the liver relative to the spleen. Small liver cysts. Right renal cyst. Normal appearing spleen, pancreas, gallbladder, adrenal glands, left kidney and urinary bladder. Exophytic uterine fibroid anteriorly on the left, measuring 3.7 cm in maximum diameter. Grossly normal appearing ovaries. Mildly prominent stool in the colon. Normal appearing appendix. No gastric or small bowel abnormalities. Marked bilateral hip  degenerative changes, greater on the left. L4-5 Ray cages. Congenital L5-S1 fusion. Small amount of tubular and patchy density in the right middle lobe on the first images. No definite lung nodule.  IMPRESSION: 1. Mildly prominent stool. 2. Mild hepatic steatosis. 3. Exophytic uterine fibroid. 4. No acute abnormality   Electronically Signed   By: Enrique Sack M.D.   On: 06/15/2014 16:21     EKG Interpretation   Date/Time:  Thursday June 15 2014 18:25:09 EST Ventricular Rate:  76 PR Interval:  162 QRS Duration: 76 QT Interval:  460 QTC Calculation: 517 R Axis:   51 Text Interpretation:  Sinus rhythm  with Premature atrial complexes  Prolonged QT Abnormal ECG since last tracing no significant change other  than QT prolonged Confirmed by BELFI  MD, MELANIE ([id number]) on 06/15/2014  6:29:23 PM      MDM Potassium 2.2   Pt given IV potassium and po potassium.   Ct scan reviewed.      Final diagnoses:  Bleeding  Hypokalemia  Rectal bleeding    I spoke to Dr. Katha Hamming [facility] hospitalist who will admit for further evaluation.  She request Gi consult.  Gi consulted. Dr. Amedeo Plenty will consult on pt.    Pt will be admitted to [facility], PA-C 06/15/14 [place], PA-C 06/15/14 1950  Malvin Johns, MD 06/15/14 [phone]

## 2014-06-15 NOTE — ED Notes (Signed)
Pt transported to CT. Will start IV potassium when she returns.

## 2014-06-15 NOTE — Patient Instructions (Signed)
Rectal Bleeding °Rectal bleeding is when blood passes out of the anus. It is usually a sign that something is wrong. It may not be serious, but it should always be evaluated. Rectal bleeding may present as bright red blood or extremely dark stools. The color may range from dark red or maroon to black (like tar). It is important that the cause of rectal bleeding be identified so treatment can be started and the problem corrected. °CAUSES  °· Hemorrhoids. These are enlarged (dilated) blood vessels or veins in the anal or rectal area. °· Fistulas. These are abnormal, burrowing channels that usually run from inside the rectum to the skin around the anus. They can bleed. °· Anal fissures. This is a tear in the tissue of the anus. Bleeding occurs with bowel movements. °· Diverticulosis. This is a condition in which pockets or sacs project from the bowel wall. Occasionally, the sacs can bleed. °· Diverticulitis. This is an infection involving diverticulosis of the colon. °· Proctitis and colitis. These are conditions in which the rectum, colon, or both, can become inflamed and pitted (ulcerated). °· Polyps and cancer. Polyps are non-cancerous (benign) growths in the colon that may bleed. Certain types of polyps turn into cancer. °· Protrusion of the rectum. Part of the rectum can project from the anus and bleed. °· Certain medicines. °· Intestinal infections. °· Blood vessel abnormalities. °HOME CARE INSTRUCTIONS °· Eat a high-fiber diet to keep your stool soft. °· Limit activity. °· Drink enough fluids to keep your urine clear or pale yellow. °· Warm baths may be useful to soothe rectal pain. °· Follow up with your caregiver as directed. °SEEK IMMEDIATE MEDICAL CARE IF: °· You develop increased bleeding. °· You have black or dark red stools. °· You vomit blood or material that looks like coffee grounds. °· You have abdominal pain or tenderness. °· You have a fever. °· You feel weak, nauseous, or you faint. °· You have  severe rectal pain or you are unable to have a bowel movement. °MAKE SURE YOU: °· Understand these instructions. °· Will watch your condition. °· Will get help right away if you are not doing well or get worse. °Document Released: 11/22/2001 Document Revised: 08/25/2011 Document Reviewed: 11/17/2010 °ExitCare® Patient Information ©2015 [employer]. This information is not intended to replace advice given to you by your health care provider. Make sure you discuss any questions you have with your health care provider. ° °

## 2014-06-15 NOTE — H&P (Signed)
[facility] History and Physical  Brandi [name]:[id number] DOB: 03/21/1946 DOA: 06/15/2014  Referring physician: ED physician PCP: Garnet Koyanagi, DO  Specialists:   Chief Complaint: rectal bleeding and weight loss  HPI: Brandi [name] is a 68 y.o. female with past medical history of hypertension, back pain, GERD, colon polyps, who presents with rectal bleeding and weight loss.  Patient has significant family history of colon cancer. His mother, father and one sister died of colon cancer. She reports that she has chronic and intermittent rectal bleeding in the past 20 years. During the last week, her rectal bleeding seems to be worsening, and becomes more frequent. He has a black stool the past week. She also had 10 LBs of unintentional weight loss over 7 days. She feels very weak, but no unilateral weakness or numbness in extremities. She has mild dizziness, but no chest pain or shortness of breath. Currently, she does not have nausea, vomiting, diarrhea or abdominal pain. Patient had a colonoscopy 2 months ago in [place], which showed polyps (324 polyps, which were removed).  Patient denies fever, chills, headaches, cough, chest pain, SOB, abdominal pain, diarrhea, dysuria, urgency, frequency, hematuria, skin rashes, or leg swelling.  Work up in the ED demonstrates hemoglobin 12.1, potassium 2.2, creatinine 1.46. CT abdomen/pelvis showed mild hepatic steatosis. Patient is admitted to inpatient for further evaluation treatment. GI was consulted by ED.  Review of Systems: As presented in the history of presenting illness, rest negative.  Where does patient live?  At home Can patient participate in ADLs? Yes  Allergy:  Allergies  Allergen Reactions  . Aspirin     Past Medical History  Diagnosis Date  . Osteoarthritis   . Chronic back pain   . History of gastric ulcer   . Constipation   . Anesthesia complication     years ago, but no problems with several surgeries since   . Shoulder pain   . Hypertension     Past Surgical History  Procedure Laterality Date  . Foot surgery    . Lumbar fusion    . Shoulder surgery    . Root canal  11/01/13    Social History:  reports that she has never smoked. She has never used smokeless tobacco. She reports that she does not drink alcohol or use illicit drugs.  Family History:  Family History  Problem Relation Age of Onset  . Cancer Mother     colon cancer  . Cancer Father     colon cancer  . Heart disease Neg Hx   . Cancer Sister 39    died of colon cancer     Prior to Admission medications   Medication Sig Start Date End Date Taking? Authorizing Provider  diclofenac sodium (VOLTAREN) 1 % GEL Apply 2 g topically 4 (four) times daily. 02/17/14   Rosalita Chessman, DO  drospirenone-estradiol (ANGELIQ) 0.5-1 MG per tablet Take 1 tablet by mouth daily. 03/07/14   Rosalita Chessman, DO  lubiprostone (AMITIZA) 24 MCG capsule Take 1 capsule (24 mcg total) by mouth 2 (two) times daily with a meal. 12/07/12   Camelia Eng Tysinger, PA-C  metoprolol succinate (TOPROL-XL) 50 MG 24 hr tablet Take 2 tablets (100 mg total) by mouth daily. Take with or immediately following a meal. 04/10/14   Brunetta Jeans, PA-C  omeprazole (PRILOSEC) 40 MG capsule Take 1 capsule (40 mg total) by mouth daily. 03/07/14   Rosalita Chessman, DO  oxyCODONE-acetaminophen (PERCOCET) 7.5-325 MG per  tablet Take 1 tablet by mouth every 4 (four) hours as needed for pain. 06/06/14   Rosalita Chessman, DO  ranitidine (ZANTAC) 150 MG tablet Take 1 tablet (150 mg total) by mouth 2 (two) times daily. 11/08/13   Rosalita Chessman, DO  temazepam (RESTORIL) 30 MG capsule 1 po qhs prn sleep 06/12/14   Rosalita Chessman, DO  traZODone (DESYREL) 50 MG tablet 1-2 po qhs prn 06/15/14   Rosalita Chessman, DO    Physical Exam: Filed Vitals:   06/15/14 1858 06/15/14 2006 06/15/14 2101 06/15/14 2241  BP: 129/67 95/57 97/51  119/69  Pulse: 93 74 79 72  Temp:  98.3 F (36.8 C) 98.4 F (36.9  C) 98.2 F (36.8 C)  TempSrc:  Oral Oral Oral  Resp: 20 18 18 18   Height:    5\' 4"  (1.626 m)  Weight:    59.6 kg (131 lb 6.3 oz)  SpO2: 98% 98% 100% 100%   General: Not in acute distress HEENT:       Eyes: PERRL, EOMI, no scleral icterus       ENT: No discharge from the ears and nose, no pharynx injection, no tonsillar enlargement.        Neck: No JVD, no bruit, no mass felt. Cardiac: S1/S2, RRR, No murmurs, No gallops or rubs Pulm: Good air movement bilaterally. Clear to auscultation bilaterally. No rales, wheezing, rhonchi or rubs. Abd: Soft, nondistended, mild tenderness diffusely, no rebound pain, no organomegaly, BS present Ext: No edema bilaterally. 2+DP/PT pulse bilaterally Musculoskeletal: No joint deformities, erythema, or stiffness, ROM full Skin: No rashes.  Neuro: Alert and oriented X3, cranial nerves II-XII grossly intact, muscle strength 5/5 in all extremeties, sensation to light touch intact.  Psych: Patient is not psychotic, no suicidal or hemocidal ideation.  Labs on Admission:  Basic Metabolic Panel:  Recent Labs Lab 06/15/14 1342  NA 136  K 2.2*  CL 82*  CO2 41*  GLUCOSE 103*  BUN 14  CREATININE 1.46*  CALCIUM 9.2   Liver Function Tests:  Recent Labs Lab 06/15/14 1342  AST 24  ALT 14  ALKPHOS 73  BILITOT 0.3  PROT 7.2  ALBUMIN 4.0   No results for input(s): LIPASE, AMYLASE in the last 168 hours. No results for input(s): AMMONIA in the last 168 hours. CBC:  Recent Labs Lab 06/15/14 1342  WBC 11.0*  NEUTROABS 8.3*  HGB 12.1  HCT 36.3  MCV 98.6  PLT 408*   Cardiac Enzymes: No results for input(s): CKTOTAL, CKMB, CKMBINDEX, TROPONINI in the last 168 hours.  BNP (last 3 results) No results for input(s): PROBNP in the last 8760 hours. CBG: No results for input(s): GLUCAP in the last 168 hours.  Radiological Exams on Admission: Ct Abdomen Pelvis W Contrast  06/15/2014   CLINICAL DATA:  Upper abdominal pain and rectal bleeding for  the past week. Nausea, vomiting and diarrhea.  EXAM: CT ABDOMEN AND PELVIS WITH CONTRAST  TECHNIQUE: Multidetector CT imaging of the abdomen and pelvis was performed using the standard protocol following bolus administration of intravenous contrast.  CONTRAST:  39mL OMNIPAQUE IOHEXOL 300 MG/ML SOLN, 51mL OMNIPAQUE IOHEXOL 300 MG/ML SOLN  COMPARISON:  None.  FINDINGS: Mild diffuse low density of the liver relative to the spleen. Small liver cysts. Right renal cyst. Normal appearing spleen, pancreas, gallbladder, adrenal glands, left kidney and urinary bladder. Exophytic uterine fibroid anteriorly on the left, measuring 3.7 cm in maximum diameter. Grossly normal appearing ovaries. Mildly prominent stool in the  colon. Normal appearing appendix. No gastric or small bowel abnormalities. Marked bilateral hip degenerative changes, greater on the left. L4-5 Ray cages. Congenital L5-S1 fusion. Small amount of tubular and patchy density in the right middle lobe on the first images. No definite lung nodule.  IMPRESSION: 1. Mildly prominent stool. 2. Mild hepatic steatosis. 3. Exophytic uterine fibroid. 4. No acute abnormality   Electronically Signed   By: Enrique Sack M.D.   On: 06/15/2014 16:21    EKG: Independently reviewed. Occasional PAC  Assessment/Plan Principal Problem:   Rectal bleeding Active Problems:   GERD (gastroesophageal reflux disease)   Chronic constipation   Essential hypertension, benign   Hypokalemia   AKI (acute kidney injury)  Rectal bleeding: CT abdomen/pelvis showed no acute abnormalities. Patient has significant family history of colon cancer. It is important to rule out colon cancer given her significant body weight loss and rectal bleeding. Currently patient is hemodynamically stable. Hemoglobin 12.1 on admission. Patient is mildly tachycardia, likely due to rectal bleeding and dehydration. GI was consulted by ED.  - will admit to tele bed - GI consulted by Ed, will follow up  recommendations - NPO for possible EGD - NS at 125 mL/hr - Start IV pantoprazole 40 mg bib - Zofran IV for nausea - Avoid NSAIDs and SQ heparin - Monitor closely and follow q6h cbc, transfuse as necessary. - LaB: INR, PTT, Lactate  GRED: -Protonix and pepcide  Hypokalemia: Potassium 2.2 on admission -Received total of 60 mEq of potassium in ED, will give 40 mEq again  HTN: Patient used to take metoprolol, but has not been taking his medication recently. Blood pressure is normal. -We'll observe blood pressure carefully, and not start medications now.  AKI: The second creatinine is normal. Her creatinine 1.46 on admission likely due to pre-renal secondary to dehydration and GI bleeding. -IVF as above -check FeNa and US-renal   DVT ppx: SCD  Code Status: Full code Family Communication: None at bed side.      Disposition Plan: Admit to inpatient   Date of Service 06/15/2014    Ivor Costa [facility] Pager [phone]  If 7PM-7AM, please contact night-coverage [email or web address] Password [email or web address] 06/15/2014, 11:21 PM

## 2014-06-15 NOTE — ED Notes (Signed)
Attempted IV access x2 without success. Richardson Landry Cothren, RT to attempt.

## 2014-06-15 NOTE — Progress Notes (Signed)
Subjective:    Brandi [name] is a 68 y.o. female here for evaluation of blood in stool. Patient has associated symptoms of change in stool color: black and tarry. The patient denies abdominal pain, alternating loose stools and constipation, constipation, diarrhea and loose stools. The patient has a known history of: colon polyps and gastric ulcer. The patient has had several  episodes of rectal bleeding. There is not a history of rectal injury. Patient has had similar episodes of rectal bleeding in the past.  She recently had an EGD and colonoscopy in [place]--- polyps seen with min bleeding --- no abd pain.  Pt is feeling dizzy and light headed..  It has worsened since this am.    The following portions of the patient's history were reviewed and updated as appropriate:  She  has a past medical history of Osteoarthritis; Chronic back pain; History of gastric ulcer; Constipation; Anesthesia complication; Shoulder pain; and Hypertension. She  does not have any pertinent problems on file. She  has past surgical history that includes Foot surgery; Lumbar fusion; Shoulder surgery; and Root canal (11/01/13). Her family history includes Cancer in her father and mother; Cancer (age of onset: 59) in her sister. There is no history of Heart disease. She  reports that she has never smoked. She has never used smokeless tobacco. She reports that she does not drink alcohol or use illicit drugs. She has a current medication list which includes the following prescription(s): diclofenac sodium, drospirenone-estradiol, lubiprostone, metoprolol succinate, omeprazole, oxycodone-acetaminophen, ranitidine, and temazepam. Current Outpatient Prescriptions on File Prior to Visit  Medication Sig Dispense Refill  . diclofenac sodium (VOLTAREN) 1 % GEL Apply 2 g topically 4 (four) times daily. 100 g 1  . drospirenone-estradiol (ANGELIQ) 0.5-1 MG per tablet Take 1 tablet by mouth daily. 28 tablet 11  . lubiprostone  (AMITIZA) 24 MCG capsule Take 1 capsule (24 mcg total) by mouth 2 (two) times daily with a meal. 60 capsule 2  . metoprolol succinate (TOPROL-XL) 50 MG 24 hr tablet Take 2 tablets (100 mg total) by mouth daily. Take with or immediately following a meal. 30 tablet 2  . omeprazole (PRILOSEC) 40 MG capsule Take 1 capsule (40 mg total) by mouth daily. 30 capsule 3  . oxyCODONE-acetaminophen (PERCOCET) 7.5-325 MG per tablet Take 1 tablet by mouth every 4 (four) hours as needed for pain. 120 tablet 0  . ranitidine (ZANTAC) 150 MG tablet Take 1 tablet (150 mg total) by mouth 2 (two) times daily. 180 tablet 3  . temazepam (RESTORIL) 30 MG capsule 1 po qhs prn sleep 30 capsule 0   No current facility-administered medications on file prior to visit.   She is allergic to aspirin..  Review of Systems Pertinent items are noted in HPI.    Objective:    BP 131/82 mmHg  Pulse 107  Temp(Src) 98.5 F (36.9 C) (Oral)  Wt 137 lb (62.143 kg)  SpO2 95% General appearance: alert, cooperative, appears stated age and no distress Lungs: clear to auscultation bilaterally Heart: S1, S2 normal Abdomen: soft, non-tender; bowel sounds normal; no masses,  no organomegaly Extremities: extremities normal, atraumatic, no cyanosis or edema Rectal -- heme + black stool, no hemorrhoids      Assessment:    Rectal bleeding, likely secondary to colon polyps.    Plan:    1. refer to GI here 2. Restart omeprazole and zantac-- hx pud 3. To ER if symptoms worsen. Follow up as needed.    1.  Rectal bleeding  - Ambulatory referral to Gastroenterology  2. Loss of weight Colon and egd done in [place]-- colon polyps found with some bleeding per pt   3. Insomnia Change to trazadone  4. Orthostatic hypotension Pt to go to ER for further eval of GI bleed

## 2014-06-15 NOTE — Progress Notes (Signed)
Pre visit review using our clinic review tool, if applicable. No additional management support is needed unless otherwise documented below in the visit note. 

## 2014-06-15 NOTE — ED Notes (Signed)
Rectal bleeding for a week. Black stools. Nausea. She was sent from her MD's office with positive guaiac. 10# weight loss in a week.

## 2014-06-15 NOTE — Progress Notes (Signed)
Patient presents with melena, 10 pound weight loss, abdominal pain. Found to have hypokalemia, hypochloremia; Vitals stable. ED physician will consult GI also. Accepted to telemetry. IV kcl times 3 runs. Need further supplement  Brandi [name].

## 2014-06-16 ENCOUNTER — Encounter (HOSPITAL_COMMUNITY)
Admission: EM | Disposition: A | Discharge status: Home / Self Care | Payer: Self-pay | Source: Home / Self Care | Attending: Internal Medicine

## 2014-06-16 ENCOUNTER — Inpatient Hospital Stay (HOSPITAL_COMMUNITY): Payer: Medicare Other

## 2014-06-16 ENCOUNTER — Encounter (HOSPITAL_COMMUNITY): Payer: Self-pay

## 2014-06-16 HISTORY — PX: ESOPHAGOGASTRODUODENOSCOPY: SHX5428

## 2014-06-16 LAB — BASIC METABOLIC PANEL
ANION GAP: 8 (ref 5–15)
Anion gap: 11 (ref 5–15)
BUN: 6 mg/dL (ref 6–23)
CO2: 29 mmol/L (ref 19–32)
CO2: 27 mmol/L (ref 19–32)
Calcium: 8.2 mg/dL — ABNORMAL LOW (ref 8.4–10.5)
Calcium: 8.3 mg/dL — ABNORMAL LOW (ref 8.4–10.5)
Chloride: 97 mEq/L (ref 96–112)
Chloride: 101 mEq/L (ref 96–112)
Creatinine, Ser: 1.05 mg/dL (ref 0.50–1.10)
Creatinine, Ser: 0.92 mg/dL (ref 0.50–1.10)
GFR calc Af Amer: 74 mL/min — ABNORMAL LOW (ref >90)
GFR, EST AFRICAN AMERICAN: 63 mL/min — AB (ref >90)
GFR, EST NON AFRICAN AMERICAN: 54 mL/min — AB (ref >90)
GFR, EST NON AFRICAN AMERICAN: 63 mL/min — AB (ref >90)
Glucose, Bld: 85 mg/dL (ref 70–99)
Glucose, Bld: 71 mg/dL (ref 70–99)
POTASSIUM: 2.7 mmol/L — AB (ref 3.5–5.1)
Potassium: 3.4 mmol/L — ABNORMAL LOW (ref 3.5–5.1)
SODIUM: 137 mmol/L (ref 135–145)
SODIUM: 136 mmol/L (ref 135–145)

## 2014-06-16 LAB — CBC
HCT: 31.5 % — ABNORMAL LOW (ref 36.0–46.0)
HCT: 33.8 % — ABNORMAL LOW (ref 36.0–46.0)
HEMATOCRIT: 33.7 % — AB (ref 36.0–46.0)
HEMOGLOBIN: 11.1 g/dL — AB (ref 12.0–15.0)
Hemoglobin: 10.6 g/dL — ABNORMAL LOW (ref 12.0–15.0)
Hemoglobin: 11.1 g/dL — ABNORMAL LOW (ref 12.0–15.0)
MCH: 32.6 pg (ref 26.0–34.0)
MCH: 33.2 pg (ref 26.0–34.0)
MCH: 32.7 pg (ref 26.0–34.0)
MCHC: 32.9 g/dL (ref 30.0–36.0)
MCHC: 33.7 g/dL (ref 30.0–36.0)
MCHC: 32.8 g/dL (ref 30.0–36.0)
MCV: 99.1 fL (ref 78.0–100.0)
MCV: 98.7 fL (ref 78.0–100.0)
MCV: 99.7 fL (ref 78.0–100.0)
PLATELETS: 355 10*3/uL (ref 150–400)
Platelets: 332 10*3/uL (ref 150–400)
Platelets: 354 10*3/uL (ref 150–400)
RBC: 3.4 MIL/uL — ABNORMAL LOW (ref 3.87–5.11)
RBC: 3.19 MIL/uL — AB (ref 3.87–5.11)
RBC: 3.39 MIL/uL — AB (ref 3.87–5.11)
RDW: 13.8 % (ref 11.5–15.5)
RDW: 13.9 % (ref 11.5–15.5)
RDW: 13.9 % (ref 11.5–15.5)
WBC: 9.1 10*3/uL (ref 4.0–10.5)
WBC: 9 10*3/uL (ref 4.0–10.5)
WBC: 9 10*3/uL (ref 4.0–10.5)

## 2014-06-16 LAB — RAPID URINE DRUG SCREEN, HOSP PERFORMED
Amphetamines: NOT DETECTED
BENZODIAZEPINES: NOT DETECTED
Barbiturates: NOT DETECTED
COCAINE: NOT DETECTED
OPIATES: POSITIVE — AB
TETRAHYDROCANNABINOL: NOT DETECTED

## 2014-06-16 LAB — GLUCOSE, CAPILLARY
GLUCOSE-CAPILLARY: 59 mg/dL — AB (ref 70–99)
Glucose-Capillary: 121 mg/dL — ABNORMAL HIGH (ref 70–99)

## 2014-06-16 LAB — MAGNESIUM: Magnesium: 2.2 mg/dL (ref 1.5–2.5)

## 2014-06-16 LAB — POTASSIUM: Potassium: 2.2 mmol/L — CL (ref 3.5–5.1)

## 2014-06-16 LAB — TYPE AND SCREEN
ABO/RH(D): O POS
Antibody Screen: NEGATIVE

## 2014-06-16 LAB — SODIUM, URINE, RANDOM: Sodium, Ur: 32 mmol/L

## 2014-06-16 LAB — ABO/RH: ABO/RH(D): O POS

## 2014-06-16 LAB — PROTIME-INR
INR: 0.93 (ref 0.00–1.49)
Prothrombin Time: 12.6 seconds (ref 11.6–15.2)

## 2014-06-16 LAB — LACTIC ACID, PLASMA: Lactic Acid, Venous: 2 mmol/L (ref 0.5–2.2)

## 2014-06-16 LAB — CREATININE, URINE, RANDOM: CREATININE, URINE: 51.37 mg/dL

## 2014-06-16 LAB — APTT: APTT: 28 s (ref 24–37)

## 2014-06-16 SURGERY — EGD (ESOPHAGOGASTRODUODENOSCOPY)
Anesthesia: Moderate Sedation

## 2014-06-16 MED ORDER — BUTAMBEN-TETRACAINE-BENZOCAINE 2-2-14 % EX AERO
INHALATION_SPRAY | CUTANEOUS | Status: DC | PRN
Start: 2014-06-16 — End: 2014-06-16
  Administered 2014-06-16: 2 via TOPICAL

## 2014-06-16 MED ORDER — MIDAZOLAM HCL 5 MG/ML IJ SOLN
INTRAMUSCULAR | Status: AC
  Filled 2014-06-16: qty 2

## 2014-06-16 MED ORDER — FENTANYL CITRATE 0.05 MG/ML IJ SOLN
INTRAMUSCULAR | Status: DC | PRN
  Administered 2014-06-16 (×2): 25 ug via INTRAVENOUS

## 2014-06-16 MED ORDER — POTASSIUM CHLORIDE CRYS ER 20 MEQ PO TBCR
40.0000 meq | EXTENDED_RELEASE_TABLET | Freq: Once | ORAL | Status: AC
  Administered 2014-06-16: 40 meq via ORAL
  Filled 2014-06-16: qty 2

## 2014-06-16 MED ORDER — TEMAZEPAM 15 MG PO CAPS
30.0000 mg | ORAL_CAPSULE | Freq: Every day | ORAL | Status: DC
  Administered 2014-06-16 – 2014-06-18 (×3): 30 mg via ORAL
  Filled 2014-06-16 (×3): qty 2

## 2014-06-16 MED ORDER — MORPHINE SULFATE 2 MG/ML IJ SOLN
2.0000 mg | INTRAMUSCULAR | Status: DC | PRN
  Administered 2014-06-16 (×2): 2 mg via INTRAVENOUS
  Filled 2014-06-16: qty 1

## 2014-06-16 MED ORDER — KCL IN DEXTROSE-NACL 40-5-0.9 MEQ/L-%-% IV SOLN
INTRAVENOUS | Status: DC
  Administered 2014-06-16 (×2): via INTRAVENOUS
  Filled 2014-06-16 (×3): qty 1000

## 2014-06-16 MED ORDER — FENTANYL CITRATE 0.05 MG/ML IJ SOLN
INTRAMUSCULAR | Status: AC
  Filled 2014-06-16: qty 2

## 2014-06-16 MED ORDER — POTASSIUM CHLORIDE 10 MEQ/100ML IV SOLN
10.0000 meq | INTRAVENOUS | Status: AC
  Administered 2014-06-16 (×6): 10 meq via INTRAVENOUS
  Filled 2014-06-16 (×6): qty 100

## 2014-06-16 MED ORDER — MIDAZOLAM HCL 10 MG/2ML IJ SOLN
INTRAMUSCULAR | Status: DC | PRN
  Administered 2014-06-16 (×2): 2 mg via INTRAVENOUS

## 2014-06-16 MED ORDER — DEXTROSE 50 % IV SOLN
INTRAVENOUS | Status: AC
  Administered 2014-06-16: 50 mL
  Filled 2014-06-16: qty 50

## 2014-06-16 MED ORDER — MORPHINE SULFATE 2 MG/ML IJ SOLN
INTRAMUSCULAR | Status: AC
  Filled 2014-06-16: qty 1

## 2014-06-16 NOTE — Progress Notes (Signed)
GI follow-up: EGD showed no bleeding source. We'll continue to monitor stools and hemoglobin. If she continues to appear to lose blood will consider capsule endoscopy.

## 2014-06-16 NOTE — H&P (View-Only) (Signed)
[facility] Consult Note  Referring Provider: No ref. provider found Primary Care Physician:  Garnet Koyanagi, DO Primary Gastroenterologist:  Dr.  Laurel Dimmer Complaint: BLack stool HPI: Brandi [name] is an 69 y.o. black female  who presented to her primary physician yesterday with a several-day history of black stool with no abdominal pain and was also found to have a low potassium of 2.2. She states she has a family history of colon cancer in 3 first-degree relatives and has colonoscopies yearly and occasionally has polyps. Her last colonoscopy was done in [place] 2 or 3 months ago in all she also had an endoscopy at that time which apparently showed some acid reflux. She has received several units of potassium since arrival.  Past Medical History  Diagnosis Date  . Osteoarthritis   . Chronic back pain   . History of gastric ulcer   . Constipation   . Anesthesia complication     years ago, but no problems with several surgeries since  . Shoulder pain   . Hypertension     Past Surgical History  Procedure Laterality Date  . Foot surgery    . Lumbar fusion    . Shoulder surgery    . Root canal  11/01/13    Medications Prior to Admission  Medication Sig Dispense Refill  . diclofenac sodium (VOLTAREN) 1 % GEL Apply 2 g topically 4 (four) times daily. 100 g 1  . drospirenone-estradiol (ANGELIQ) 0.5-1 MG per tablet Take 1 tablet by mouth daily. 28 tablet 11  . lubiprostone (AMITIZA) 24 MCG capsule Take 1 capsule (24 mcg total) by mouth 2 (two) times daily with a meal. 60 capsule 2  . metoprolol succinate (TOPROL-XL) 50 MG 24 hr tablet Take 2 tablets (100 mg total) by mouth daily. Take with or immediately following a meal. 30 tablet 2  . omeprazole (PRILOSEC) 40 MG capsule Take 1 capsule (40 mg total) by mouth daily. 30 capsule 3  . oxyCODONE-acetaminophen (PERCOCET) 7.5-325 MG per tablet Take 1 tablet by mouth every 4 (four) hours as needed for pain. 120 tablet 0  . ranitidine  (ZANTAC) 150 MG tablet Take 1 tablet (150 mg total) by mouth 2 (two) times daily. 180 tablet 3  . temazepam (RESTORIL) 30 MG capsule 1 po qhs prn sleep 30 capsule 0  . traZODone (DESYREL) 50 MG tablet 1-2 po qhs prn 60 tablet 3    Allergies:  Allergies  Allergen Reactions  . Aspirin     Family History  Problem Relation Age of Onset  . Cancer Mother     colon cancer  . Cancer Father     colon cancer  . Heart disease Neg Hx   . Cancer Sister 83    died of colon cancer    Social History:  reports that she has never smoked. She has never used smokeless tobacco. She reports that she does not drink alcohol or use illicit drugs.  Review of Systems: negative except as above   Blood pressure 120/71, pulse 65, temperature 98.3 F (36.8 C), temperature source Oral, resp. rate 18, height _0  (1.626 m), weight 59 kg (130 lb 1.1 oz), SpO2 100 %. Head: Normocephalic, without obvious abnormality, atraumatic Neck: no adenopathy, no carotid bruit, no JVD, supple, symmetrical, trachea midline and thyroid not enlarged, symmetric, no tenderness/mass/nodules Resp: clear to auscultation bilaterally Cardio: regular rate and rhythm, S1, S2 normal, no murmur, click, rub or gallop GI: Abdomen soft nondistended with normoactive bowel sounds. No hepatosplenomegaly  mass or guarding Extremities: extremities normal, atraumatic, no cyanosis or edema  Results for orders placed or performed during the hospital encounter of 06/15/14 (from the past 48 hour(s))  CBC with Differential     Status: Abnormal   Collection Time: 06/15/14  1:42 PM  Result Value Ref Range   WBC 11.0 (H) 4.0 - 10.5 K/uL   RBC 3.68 (L) 3.87 - 5.11 MIL/uL   Hemoglobin 12.1 12.0 - 15.0 g/dL   HCT 36.3 36.0 - 46.0 %   MCV 98.6 78.0 - 100.0 fL   MCH 32.9 26.0 - 34.0 pg   MCHC 33.3 30.0 - 36.0 g/dL   RDW 13.1 11.5 - 15.5 %   Platelets 408 (H) 150 - 400 K/uL   Neutrophils Relative % 75 43 - 77 %   Neutro Abs 8.3 (H) 1.7 - 7.7 K/uL    Lymphocytes Relative 16 12 - 46 %   Lymphs Abs 1.7 0.7 - 4.0 K/uL   Monocytes Relative 8 3 - 12 %   Monocytes Absolute 0.9 0.1 - 1.0 K/uL   Eosinophils Relative 1 0 - 5 %   Eosinophils Absolute 0.1 0.0 - 0.7 K/uL   Basophils Relative 0 0 - 1 %   Basophils Absolute 0.0 0.0 - 0.1 K/uL  Comprehensive metabolic panel     Status: Abnormal   Collection Time: 06/15/14  1:42 PM  Result Value Ref Range   Sodium 136 135 - 145 mmol/L    Comment: Please note change in reference range.   Potassium 2.2 (LL) 3.5 - 5.1 mmol/L    Comment: Please note change in reference range. CRITICAL RESULT CALLED TO, READ BACK BY AND VERIFIED WITH: SCOTT BENNETT RN _0  06/15/14 OLSONM REPEATED TO VERIFY    Chloride 82 (L) 96 - 112 mEq/L   CO2 41 (HH) 19 - 32 mmol/L    Comment: CRITICAL RESULT CALLED TO, READ BACK BY AND VERIFIED WITH: SCOTT BENNETT RN _1  06/15/14 OLSONM REPEATED TO VERIFY    Glucose, Bld 103 (H) 70 - 99 mg/dL   BUN 14 6 - 23 mg/dL   Creatinine, Ser 1.46 (H) 0.50 - 1.10 mg/dL   Calcium 9.2 8.4 - 10.5 mg/dL   Total Protein 7.2 6.0 - 8.3 g/dL   Albumin 4.0 3.5 - 5.2 g/dL   AST 24 0 - 37 U/L   ALT 14 0 - 35 U/L   Alkaline Phosphatase 73 39 - 117 U/L   Total Bilirubin 0.3 0.3 - 1.2 mg/dL   GFR calc non Af Amer 36 (L) >90 mL/min   GFR calc Af Amer 42 (L) >90 mL/min    Comment: (NOTE) The eGFR has been calculated using the CKD EPI equation. This calculation has not been validated in all clinical situations. eGFR's persistently <90 mL/min signify possible Chronic Kidney Disease.    Anion gap 13 5 - 15  Urinalysis, Routine w reflex microscopic     Status: Abnormal   Collection Time: 06/15/14  6:22 PM  Result Value Ref Range   Color, Urine YELLOW YELLOW   APPearance CLEAR CLEAR   Specific Gravity, Urine 1.018 1.005 - 1.030   pH 8.0 5.0 - 8.0   Glucose, UA NEGATIVE NEGATIVE mg/dL   Hgb urine dipstick NEGATIVE NEGATIVE   Bilirubin Urine NEGATIVE NEGATIVE   Ketones, ur NEGATIVE  NEGATIVE mg/dL   Protein, ur NEGATIVE NEGATIVE mg/dL   Urobilinogen, UA 0.2 0.0 - 1.0 mg/dL   Nitrite NEGATIVE NEGATIVE   Leukocytes, UA SMALL (A)  NEGATIVE  Urine microscopic-add on     Status: None   Collection Time: 06/15/14  6:22 PM  Result Value Ref Range   Squamous Epithelial / LPF RARE RARE   WBC, UA 3-6 <3 WBC/hpf   Bacteria, UA RARE RARE  Potassium     Status: Abnormal   Collection Time: 06/16/14  1:30 AM  Result Value Ref Range   Potassium 2.2 (LL) 3.5 - 5.1 mmol/L    Comment: Please note change in reference range. REPEATED TO VERIFY CRITICAL RESULT CALLED TO, READ BACK BY AND VERIFIED WITH: [place] [id number] 06/16/14 E.GADDY   CBC     Status: Abnormal   Collection Time: 06/16/14  1:35 AM  Result Value Ref Range   WBC 9.1 4.0 - 10.5 K/uL   RBC 3.40 (L) 3.87 - 5.11 MIL/uL   Hemoglobin 11.1 (L) 12.0 - 15.0 g/dL   HCT 33.7 (L) 36.0 - 46.0 %   MCV 99.1 78.0 - 100.0 fL   MCH 32.6 26.0 - 34.0 pg   MCHC 32.9 30.0 - 36.0 g/dL   RDW 13.8 11.5 - 15.5 %   Platelets 332 150 - 400 K/uL  Lactic acid, plasma     Status: None   Collection Time: 06/16/14  1:35 AM  Result Value Ref Range   Lactic Acid, Venous 2.0 0.5 - 2.2 mmol/L  Protime-INR     Status: None   Collection Time: 06/16/14  1:35 AM  Result Value Ref Range   Prothrombin Time 12.6 11.6 - 15.2 seconds   INR 0.93 0.00 - 1.49  APTT     Status: None   Collection Time: 06/16/14  1:35 AM  Result Value Ref Range   aPTT 28 24 - 37 seconds  Type and screen     Status: None   Collection Time: 06/16/14  1:35 AM  Result Value Ref Range   ABO/RH(D) O POS    Antibody Screen NEG    Sample Expiration 06/19/2014   ABO/Rh     Status: None (Preliminary result)   Collection Time: 06/16/14  1:35 AM  Result Value Ref Range   ABO/RH(D) O POS   Urine rapid drug screen (hosp performed)     Status: Abnormal   Collection Time: 06/16/14  3:13 AM  Result Value Ref Range   Opiates POSITIVE (A) NONE DETECTED   Cocaine NONE DETECTED NONE  DETECTED   Benzodiazepines NONE DETECTED NONE DETECTED   Amphetamines NONE DETECTED NONE DETECTED   Tetrahydrocannabinol NONE DETECTED NONE DETECTED   Barbiturates NONE DETECTED NONE DETECTED    Comment:        DRUG SCREEN FOR MEDICAL PURPOSES ONLY.  IF CONFIRMATION IS NEEDED FOR ANY PURPOSE, NOTIFY LAB WITHIN 5 DAYS.        LOWEST DETECTABLE LIMITS FOR URINE DRUG SCREEN Drug Class       Cutoff (ng/mL) Amphetamine      1000 Barbiturate      200 Benzodiazepine   768 Tricyclics       115 Opiates          300 Cocaine          300 THC              50   Creatinine, urine, random     Status: None   Collection Time: 06/16/14  3:13 AM  Result Value Ref Range   Creatinine, Urine 51.37 mg/dL  Sodium, urine, random     Status: None   Collection Time: 06/16/14  3:13 AM  Result Value Ref Range   Sodium, Ur 32 mmol/L  Basic metabolic panel     Status: Abnormal   Collection Time: 06/16/14  6:10 AM  Result Value Ref Range   Sodium 137 135 - 145 mmol/L    Comment: Please note change in reference range.   Potassium 2.7 (LL) 3.5 - 5.1 mmol/L    Comment: Please note change in reference range. CRITICAL RESULT CALLED TO, READ BACK BY AND VERIFIED WITH: CONLEY L RN 06/16/14 0723 COSTELLO B REPEATED TO VERIFY    Chloride 97 96 - 112 mEq/L   CO2 29 19 - 32 mmol/L   Glucose, Bld 85 70 - 99 mg/dL   BUN 6 6 - 23 mg/dL   Creatinine, Ser 1.05 0.50 - 1.10 mg/dL   Calcium 8.2 (L) 8.4 - 10.5 mg/dL   GFR calc non Af Amer 54 (L) >90 mL/min   GFR calc Af Amer 63 (L) >90 mL/min    Comment: (NOTE) The eGFR has been calculated using the CKD EPI equation. This calculation has not been validated in all clinical situations. eGFR's persistently <90 mL/min signify possible Chronic Kidney Disease.    Anion gap 11 5 - 15  Glucose, capillary     Status: Abnormal   Collection Time: 06/16/14  8:03 AM  Result Value Ref Range   Glucose-Capillary 59 (L) 70 - 99 mg/dL   Ct Abdomen Pelvis W  Contrast  06/15/2014   CLINICAL DATA:  Upper abdominal pain and rectal bleeding for the past week. Nausea, vomiting and diarrhea.  EXAM: CT ABDOMEN AND PELVIS WITH CONTRAST  TECHNIQUE: Multidetector CT imaging of the abdomen and pelvis was performed using the standard protocol following bolus administration of intravenous contrast.  CONTRAST:  28m OMNIPAQUE IOHEXOL 300 MG/ML SOLN, 864mOMNIPAQUE IOHEXOL 300 MG/ML SOLN  COMPARISON:  None.  FINDINGS: Mild diffuse low density of the liver relative to the spleen. Small liver cysts. Right renal cyst. Normal appearing spleen, pancreas, gallbladder, adrenal glands, left kidney and urinary bladder. Exophytic uterine fibroid anteriorly on the left, measuring 3.7 cm in maximum diameter. Grossly normal appearing ovaries. Mildly prominent stool in the colon. Normal appearing appendix. No gastric or small bowel abnormalities. Marked bilateral hip degenerative changes, greater on the left. L4-5 Ray cages. Congenital L5-S1 fusion. Small amount of tubular and patchy density in the right middle lobe on the first images. No definite lung nodule.  IMPRESSION: 1. Mildly prominent stool. 2. Mild hepatic steatosis. 3. Exophytic uterine fibroid. 4. No acute abnormality   Electronically Signed   By: StEnrique Sack.D.   On: 06/15/2014 16:21    Assessment: Melena suggestive of upper GI bleeding Strong family history of colon cancer with last colonoscopy 2 months ago Plan:  Will proceed with EGD later this morning. Rajesh Wyss C 06/16/2014, 8:29 AM

## 2014-06-16 NOTE — Progress Notes (Addendum)
CRITICAL VALUE ALERT  Critical value received:  K+ 2.2  Date of notification:  06/16/14  Time of notification:  0258  Critical value read back:Yes.    Nurse who received alert:  Karolee Stamps, RN  MD notified (1st page):  Lamar Blinks, NP  Time of first page:  [phone]   Responding MD:  Lamar Blinks, NP  Time MD responded:  [phone]

## 2014-06-16 NOTE — Progress Notes (Addendum)
CRITICAL VALUE ALERT  Critical value received:  K+ 2.7   Date of notification:  06/16/14  Time of notification:  0720  Critical value read back:Yes.    Nurse who received alert:  Karolee Stamps, RN  MD notified (1st page):  [place]  Time of first page:  0727  MD notified (2nd page):  Time of second page:  Responding MD:     Time MD responded:

## 2014-06-16 NOTE — Consult Note (Signed)
[facility] Consult Note  Referring Provider: No ref. provider found Primary Care Physician:  Garnet Koyanagi, DO Primary Gastroenterologist:  Dr.  Laurel Dimmer Complaint: BLack stool HPI: Brandi [name] is an 69 y.o. black female  who presented to her primary physician yesterday with a several-day history of black stool with no abdominal pain and was also found to have a low potassium of 2.2. She states she has a family history of colon cancer in 3 first-degree relatives and has colonoscopies yearly and occasionally has polyps. Her last colonoscopy was done in [place] 2 or 3 months ago in all she also had an endoscopy at that time which apparently showed some acid reflux. She has received several units of potassium since arrival.  Past Medical History  Diagnosis Date  . Osteoarthritis   . Chronic back pain   . History of gastric ulcer   . Constipation   . Anesthesia complication     years ago, but no problems with several surgeries since  . Shoulder pain   . Hypertension     Past Surgical History  Procedure Laterality Date  . Foot surgery    . Lumbar fusion    . Shoulder surgery    . Root canal  11/01/13    Medications Prior to Admission  Medication Sig Dispense Refill  . diclofenac sodium (VOLTAREN) 1 % GEL Apply 2 g topically 4 (four) times daily. 100 g 1  . drospirenone-estradiol (ANGELIQ) 0.5-1 MG per tablet Take 1 tablet by mouth daily. 28 tablet 11  . lubiprostone (AMITIZA) 24 MCG capsule Take 1 capsule (24 mcg total) by mouth 2 (two) times daily with a meal. 60 capsule 2  . metoprolol succinate (TOPROL-XL) 50 MG 24 hr tablet Take 2 tablets (100 mg total) by mouth daily. Take with or immediately following a meal. 30 tablet 2  . omeprazole (PRILOSEC) 40 MG capsule Take 1 capsule (40 mg total) by mouth daily. 30 capsule 3  . oxyCODONE-acetaminophen (PERCOCET) 7.5-325 MG per tablet Take 1 tablet by mouth every 4 (four) hours as needed for pain. 120 tablet 0  . ranitidine  (ZANTAC) 150 MG tablet Take 1 tablet (150 mg total) by mouth 2 (two) times daily. 180 tablet 3  . temazepam (RESTORIL) 30 MG capsule 1 po qhs prn sleep 30 capsule 0  . traZODone (DESYREL) 50 MG tablet 1-2 po qhs prn 60 tablet 3    Allergies:  Allergies  Allergen Reactions  . Aspirin     Family History  Problem Relation Age of Onset  . Cancer Mother     colon cancer  . Cancer Father     colon cancer  . Heart disease Neg Hx   . Cancer Sister 83    died of colon cancer    Social History:  reports that she has never smoked. She has never used smokeless tobacco. She reports that she does not drink alcohol or use illicit drugs.  Review of Systems: negative except as above   Blood pressure 120/71, pulse 65, temperature 98.3 F (36.8 C), temperature source Oral, resp. rate 18, height _0  (1.626 m), weight 59 kg (130 lb 1.1 oz), SpO2 100 %. Head: Normocephalic, without obvious abnormality, atraumatic Neck: no adenopathy, no carotid bruit, no JVD, supple, symmetrical, trachea midline and thyroid not enlarged, symmetric, no tenderness/mass/nodules Resp: clear to auscultation bilaterally Cardio: regular rate and rhythm, S1, S2 normal, no murmur, click, rub or gallop GI: Abdomen soft nondistended with normoactive bowel sounds. No hepatosplenomegaly  mass or guarding Extremities: extremities normal, atraumatic, no cyanosis or edema  Results for orders placed or performed during the hospital encounter of 06/15/14 (from the past 48 hour(s))  CBC with Differential     Status: Abnormal   Collection Time: 06/15/14  1:42 PM  Result Value Ref Range   WBC 11.0 (H) 4.0 - 10.5 K/uL   RBC 3.68 (L) 3.87 - 5.11 MIL/uL   Hemoglobin 12.1 12.0 - 15.0 g/dL   HCT 36.3 36.0 - 46.0 %   MCV 98.6 78.0 - 100.0 fL   MCH 32.9 26.0 - 34.0 pg   MCHC 33.3 30.0 - 36.0 g/dL   RDW 13.1 11.5 - 15.5 %   Platelets 408 (H) 150 - 400 K/uL   Neutrophils Relative % 75 43 - 77 %   Neutro Abs 8.3 (H) 1.7 - 7.7 K/uL    Lymphocytes Relative 16 12 - 46 %   Lymphs Abs 1.7 0.7 - 4.0 K/uL   Monocytes Relative 8 3 - 12 %   Monocytes Absolute 0.9 0.1 - 1.0 K/uL   Eosinophils Relative 1 0 - 5 %   Eosinophils Absolute 0.1 0.0 - 0.7 K/uL   Basophils Relative 0 0 - 1 %   Basophils Absolute 0.0 0.0 - 0.1 K/uL  Comprehensive metabolic panel     Status: Abnormal   Collection Time: 06/15/14  1:42 PM  Result Value Ref Range   Sodium 136 135 - 145 mmol/L    Comment: Please note change in reference range.   Potassium 2.2 (LL) 3.5 - 5.1 mmol/L    Comment: Please note change in reference range. CRITICAL RESULT CALLED TO, READ BACK BY AND VERIFIED WITH: SCOTT BENNETT RN _0  06/15/14 OLSONM REPEATED TO VERIFY    Chloride 82 (L) 96 - 112 mEq/L   CO2 41 (HH) 19 - 32 mmol/L    Comment: CRITICAL RESULT CALLED TO, READ BACK BY AND VERIFIED WITH: SCOTT BENNETT RN _1  06/15/14 OLSONM REPEATED TO VERIFY    Glucose, Bld 103 (H) 70 - 99 mg/dL   BUN 14 6 - 23 mg/dL   Creatinine, Ser 1.46 (H) 0.50 - 1.10 mg/dL   Calcium 9.2 8.4 - 10.5 mg/dL   Total Protein 7.2 6.0 - 8.3 g/dL   Albumin 4.0 3.5 - 5.2 g/dL   AST 24 0 - 37 U/L   ALT 14 0 - 35 U/L   Alkaline Phosphatase 73 39 - 117 U/L   Total Bilirubin 0.3 0.3 - 1.2 mg/dL   GFR calc non Af Amer 36 (L) >90 mL/min   GFR calc Af Amer 42 (L) >90 mL/min    Comment: (NOTE) The eGFR has been calculated using the CKD EPI equation. This calculation has not been validated in all clinical situations. eGFR's persistently <90 mL/min signify possible Chronic Kidney Disease.    Anion gap 13 5 - 15  Urinalysis, Routine w reflex microscopic     Status: Abnormal   Collection Time: 06/15/14  6:22 PM  Result Value Ref Range   Color, Urine YELLOW YELLOW   APPearance CLEAR CLEAR   Specific Gravity, Urine 1.018 1.005 - 1.030   pH 8.0 5.0 - 8.0   Glucose, UA NEGATIVE NEGATIVE mg/dL   Hgb urine dipstick NEGATIVE NEGATIVE   Bilirubin Urine NEGATIVE NEGATIVE   Ketones, ur NEGATIVE  NEGATIVE mg/dL   Protein, ur NEGATIVE NEGATIVE mg/dL   Urobilinogen, UA 0.2 0.0 - 1.0 mg/dL   Nitrite NEGATIVE NEGATIVE   Leukocytes, UA SMALL (A)  NEGATIVE  Urine microscopic-add on     Status: None   Collection Time: 06/15/14  6:22 PM  Result Value Ref Range   Squamous Epithelial / LPF RARE RARE   WBC, UA 3-6 <3 WBC/hpf   Bacteria, UA RARE RARE  Potassium     Status: Abnormal   Collection Time: 06/16/14  1:30 AM  Result Value Ref Range   Potassium 2.2 (LL) 3.5 - 5.1 mmol/L    Comment: Please note change in reference range. REPEATED TO VERIFY CRITICAL RESULT CALLED TO, READ BACK BY AND VERIFIED WITH: [place] [id number] 06/16/14 E.GADDY   CBC     Status: Abnormal   Collection Time: 06/16/14  1:35 AM  Result Value Ref Range   WBC 9.1 4.0 - 10.5 K/uL   RBC 3.40 (L) 3.87 - 5.11 MIL/uL   Hemoglobin 11.1 (L) 12.0 - 15.0 g/dL   HCT 33.7 (L) 36.0 - 46.0 %   MCV 99.1 78.0 - 100.0 fL   MCH 32.6 26.0 - 34.0 pg   MCHC 32.9 30.0 - 36.0 g/dL   RDW 13.8 11.5 - 15.5 %   Platelets 332 150 - 400 K/uL  Lactic acid, plasma     Status: None   Collection Time: 06/16/14  1:35 AM  Result Value Ref Range   Lactic Acid, Venous 2.0 0.5 - 2.2 mmol/L  Protime-INR     Status: None   Collection Time: 06/16/14  1:35 AM  Result Value Ref Range   Prothrombin Time 12.6 11.6 - 15.2 seconds   INR 0.93 0.00 - 1.49  APTT     Status: None   Collection Time: 06/16/14  1:35 AM  Result Value Ref Range   aPTT 28 24 - 37 seconds  Type and screen     Status: None   Collection Time: 06/16/14  1:35 AM  Result Value Ref Range   ABO/RH(D) O POS    Antibody Screen NEG    Sample Expiration 06/19/2014   ABO/Rh     Status: None (Preliminary result)   Collection Time: 06/16/14  1:35 AM  Result Value Ref Range   ABO/RH(D) O POS   Urine rapid drug screen (hosp performed)     Status: Abnormal   Collection Time: 06/16/14  3:13 AM  Result Value Ref Range   Opiates POSITIVE (A) NONE DETECTED   Cocaine NONE DETECTED NONE  DETECTED   Benzodiazepines NONE DETECTED NONE DETECTED   Amphetamines NONE DETECTED NONE DETECTED   Tetrahydrocannabinol NONE DETECTED NONE DETECTED   Barbiturates NONE DETECTED NONE DETECTED    Comment:        DRUG SCREEN FOR MEDICAL PURPOSES ONLY.  IF CONFIRMATION IS NEEDED FOR ANY PURPOSE, NOTIFY LAB WITHIN 5 DAYS.        LOWEST DETECTABLE LIMITS FOR URINE DRUG SCREEN Drug Class       Cutoff (ng/mL) Amphetamine      1000 Barbiturate      200 Benzodiazepine   768 Tricyclics       115 Opiates          300 Cocaine          300 THC              50   Creatinine, urine, random     Status: None   Collection Time: 06/16/14  3:13 AM  Result Value Ref Range   Creatinine, Urine 51.37 mg/dL  Sodium, urine, random     Status: None   Collection Time: 06/16/14  3:13 AM  Result Value Ref Range   Sodium, Ur 32 mmol/L  Basic metabolic panel     Status: Abnormal   Collection Time: 06/16/14  6:10 AM  Result Value Ref Range   Sodium 137 135 - 145 mmol/L    Comment: Please note change in reference range.   Potassium 2.7 (LL) 3.5 - 5.1 mmol/L    Comment: Please note change in reference range. CRITICAL RESULT CALLED TO, READ BACK BY AND VERIFIED WITH: CONLEY L RN 06/16/14 0723 COSTELLO B REPEATED TO VERIFY    Chloride 97 96 - 112 mEq/L   CO2 29 19 - 32 mmol/L   Glucose, Bld 85 70 - 99 mg/dL   BUN 6 6 - 23 mg/dL   Creatinine, Ser 1.05 0.50 - 1.10 mg/dL   Calcium 8.2 (L) 8.4 - 10.5 mg/dL   GFR calc non Af Amer 54 (L) >90 mL/min   GFR calc Af Amer 63 (L) >90 mL/min    Comment: (NOTE) The eGFR has been calculated using the CKD EPI equation. This calculation has not been validated in all clinical situations. eGFR's persistently <90 mL/min signify possible Chronic Kidney Disease.    Anion gap 11 5 - 15  Glucose, capillary     Status: Abnormal   Collection Time: 06/16/14  8:03 AM  Result Value Ref Range   Glucose-Capillary 59 (L) 70 - 99 mg/dL   Ct Abdomen Pelvis W  Contrast  06/15/2014   CLINICAL DATA:  Upper abdominal pain and rectal bleeding for the past week. Nausea, vomiting and diarrhea.  EXAM: CT ABDOMEN AND PELVIS WITH CONTRAST  TECHNIQUE: Multidetector CT imaging of the abdomen and pelvis was performed using the standard protocol following bolus administration of intravenous contrast.  CONTRAST:  28m OMNIPAQUE IOHEXOL 300 MG/ML SOLN, 864mOMNIPAQUE IOHEXOL 300 MG/ML SOLN  COMPARISON:  None.  FINDINGS: Mild diffuse low density of the liver relative to the spleen. Small liver cysts. Right renal cyst. Normal appearing spleen, pancreas, gallbladder, adrenal glands, left kidney and urinary bladder. Exophytic uterine fibroid anteriorly on the left, measuring 3.7 cm in maximum diameter. Grossly normal appearing ovaries. Mildly prominent stool in the colon. Normal appearing appendix. No gastric or small bowel abnormalities. Marked bilateral hip degenerative changes, greater on the left. L4-5 Ray cages. Congenital L5-S1 fusion. Small amount of tubular and patchy density in the right middle lobe on the first images. No definite lung nodule.  IMPRESSION: 1. Mildly prominent stool. 2. Mild hepatic steatosis. 3. Exophytic uterine fibroid. 4. No acute abnormality   Electronically Signed   By: StEnrique Sack.D.   On: 06/15/2014 16:21    Assessment: Melena suggestive of upper GI bleeding Strong family history of colon cancer with last colonoscopy 2 months ago Plan:  Will proceed with EGD later this morning. Natividad Schlosser C 06/16/2014, 8:29 AM

## 2014-06-16 NOTE — Op Note (Signed)
[facility] [place], [address]   ENDOSCOPY PROCEDURE REPORT  PATIENT: Brandi [name], Brandi [name]  MR#: [id number] BIRTHDATE: April 19, 1946 , 49  yrs. old GENDER: female ENDOSCOPIST: Teena Irani, MD REFERRED BY: PROCEDURE DATE:  07/05/14 PROCEDURE: ASA CLASS: INDICATIONS:  dark heme positive stools MEDICATIONS: 50 fentanyl, 4 mg Versed TOPICAL ANESTHETIC: Cetacaine spray  DESCRIPTION OF PROCEDURE: After the risks benefits and alternatives of the procedure were thoroughly explained, informed consent was obtained.  The Pentax video      endoscope was introduced through the mouth and advanced to the second portion of the duodenum , Without limitations.  The instrument was slowly withdrawn as the mucosa was fully examined.    esophagus:  Small hiatal hernia Stomach: Normal. No blood in the stomach Duodenum: Normal             The scope was then withdrawn from the patient and the procedure completed.  COMPLICATIONS: There were no immediate complications.  ENDOSCOPIC IMPRESSION: small hiatal hernia with no source of upper GI tract bleeding noted.  RECOMMENDATIONS: monitor stools and hemoglobin and consider capsule endoscopy if continues to have melenic stools associated with drop in hemoglobin.  REPEAT EXAM:  eSigned:  Teena Irani, MD 07/05/14 11:39 AM    CC:  CPT CODES: ICD CODES:  The ICD and CPT codes recommended by this software are interpretations from the data that the clinical staff has captured with the software.  The verification of the translation of this report to the ICD and CPT codes and modifiers is the sole responsibility of the health care institution and practicing physician where this report was generated.  [employer] will not be held responsible for the validity of the ICD and CPT codes included on this report.  AMA assumes no liability for data contained or not contained herein. CPT is a  Designer, television/film set of the [employer].  PATIENT NAME:  Brandi [name], Brandi [name] MR#: [id number]

## 2014-06-16 NOTE — Progress Notes (Signed)
[facility] PROGRESS NOTE  Brandi [name] MLY:[id number] DOB: 09-Apr-1946 DOA: 06/15/2014 PCP: Garnet Koyanagi, DO  Assessment/Plan: 1-Melena, GI bleed; S/P endoscopy which only showed hiatal hernia. If bleeding, drop in hb  persist plan is for capsule endoscopy.  Hb stable at 11.  2-Hypokalemia; in setting of diarrhea. Replete IV and oral. Will repeat B-met this afternoon.   3-HTN; Follow BP trend.   AKI; continue with IV fluids. Renal US with renal cyst.   Code Status: full code.  Family Communication: care discussed with patient.  Disposition Plan: remain inpatient.    Consultants:  Dr Amedeo Plenty.   Procedures:  Endoscopy; hiatal hernia  Antibiotics:  none  HPI/Subjective: Feeling better than yesterday. She has been having melena for last couple of weeks, worse recently  Objective: Filed Vitals:   06/16/14 1308  BP: 91/60  Pulse:   Temp: 97.8 F (36.6 C)  Resp: 16    Intake/Output Summary (Last 24 hours) at 06/16/14 1424 Last data filed at 06/16/14 1229  Gross per 24 hour  Intake 1228.75 ml  Output      0 ml  Net 1228.75 ml   Filed Weights   06/15/14 1220 06/15/14 2241 06/16/14 0548  Weight: 62.143 kg (137 lb) 59.6 kg (131 lb 6.3 oz) 59 kg (130 lb 1.1 oz)    Exam:   General:  Alert in no distress.   Cardiovascular: S 1, S 2, RRR  Respiratory: CTA  Abdomen: Bs present, soft, nt  Musculoskeletal: no edema   Data Reviewed: Basic Metabolic Panel:  Recent Labs Lab 06/15/14 1342 06/16/14 0130 06/16/14 0610 06/16/14 1127  NA 136  --  137  --   K 2.2* 2.2* 2.7*  --   CL 82*  --  97  --   CO2 41*  --  29  --   GLUCOSE 103*  --  85  --   BUN 14  --  6  --   CREATININE 1.46*  --  1.05  --   CALCIUM 9.2  --  8.2*  --   MG  --   --   --  2.2   Liver Function Tests:  Recent Labs Lab 06/15/14 1342  AST 24  ALT 14  ALKPHOS 73  BILITOT 0.3  PROT 7.2  ALBUMIN 4.0   No results for input(s): LIPASE, AMYLASE in the last 168 hours. No  results for input(s): AMMONIA in the last 168 hours. CBC:  Recent Labs Lab 06/15/14 1342 06/16/14 0135 06/16/14 0900 06/16/14 1127  WBC 11.0* 9.1 9.0 9.0  NEUTROABS 8.3*  --   --   --   HGB 12.1 11.1* 10.6* 11.1*  HCT 36.3 33.7* 31.5* 33.8*  MCV 98.6 99.1 98.7 99.7  PLT 408* 332 355 354   Cardiac Enzymes: No results for input(s): CKTOTAL, CKMB, CKMBINDEX, TROPONINI in the last 168 hours. BNP (last 3 results) No results for input(s): PROBNP in the last 8760 hours. CBG:  Recent Labs Lab 06/16/14 0803 06/16/14 0846  GLUCAP 59* 121*    No results found for this or any previous visit (from the past 240 hour(s)).   Studies: Ct Abdomen Pelvis W Contrast  06/15/2014   CLINICAL DATA:  Upper abdominal pain and rectal bleeding for the past week. Nausea, vomiting and diarrhea.  EXAM: CT ABDOMEN AND PELVIS WITH CONTRAST  TECHNIQUE: Multidetector CT imaging of the abdomen and pelvis was performed using the standard protocol following bolus administration of intravenous contrast.  CONTRAST:  14m OMNIPAQUE  IOHEXOL 300 MG/ML SOLN, 56m OMNIPAQUE IOHEXOL 300 MG/ML SOLN  COMPARISON:  None.  FINDINGS: Mild diffuse low density of the liver relative to the spleen. Small liver cysts. Right renal cyst. Normal appearing spleen, pancreas, gallbladder, adrenal glands, left kidney and urinary bladder. Exophytic uterine fibroid anteriorly on the left, measuring 3.7 cm in maximum diameter. Grossly normal appearing ovaries. Mildly prominent stool in the colon. Normal appearing appendix. No gastric or small bowel abnormalities. Marked bilateral hip degenerative changes, greater on the left. L4-5 Ray cages. Congenital L5-S1 fusion. Small amount of tubular and patchy density in the right middle lobe on the first images. No definite lung nodule.  IMPRESSION: 1. Mildly prominent stool. 2. Mild hepatic steatosis. 3. Exophytic uterine fibroid. 4. No acute abnormality   Electronically Signed   By: SEnrique SackM.D.   On:  06/15/2014 16:21   [place]  06/16/2014   CLINICAL DATA:  Acute kidney injury, hypertension  EXAM: RENAL/URINARY TRACT ULTRASOUND COMPLETE  COMPARISON:  CT 06/15/2014  FINDINGS: Right Kidney:  Length: 10.2 cm. Echogenicity within normal limits. 20 x 18 x 19 mm midpole cyst. No hydronephrosis.  Left Kidney:  Length: 10.3 cm. Echogenicity within normal limits. No mass or hydronephrosis visualized.  Bladder:  Appears normal for degree of bladder distention.  IMPRESSION: 1. Negative for hydronephrosis. 2. 2 cm right renal cyst.   Electronically Signed   By: DArne ClevelandM.D.   On: 06/16/2014 09:07    Scheduled Meds: . drospirenone-estradiol  1 tablet Oral Daily  . famotidine  20 mg Oral BID  . lubiprostone  24 mcg Oral BID WC  . pantoprazole (PROTONIX) IV  40 mg Intravenous Q12H  . potassium chloride  40 mEq Oral Once  . sodium chloride  3 mL Intravenous Q12H  . temazepam  30 mg Oral QHS   Continuous Infusions: . dextrose 5 % and 0.9 % NaCl with KCl 40 mEq/L 100 mL/hr at 06/16/14 1013    Principal Problem:   Rectal bleeding Active Problems:   GERD (gastroesophageal reflux disease)   Chronic constipation   Essential hypertension, benign   Hypokalemia   AKI (acute kidney injury)    Time spent: 35 minutes.     RNiel HummerA  [facility] Pager [phone] If 7PM-7AM, please contact night-coverage at [email or web address], password [email or web address]1/06/2014, 2:24 PM  LOS: 1 day

## 2014-06-16 NOTE — Interval H&P Note (Signed)
History and Physical Interval Note:  06/16/2014 11:15 AM  Brandi [name]  has presented today for surgery, with the diagnosis of Melena  The various methods of treatment have been discussed with the patient and family. After consideration of risks, benefits and other options for treatment, the patient has consented to  Procedure(s): ESOPHAGOGASTRODUODENOSCOPY (EGD) (N/A) as a surgical intervention .  The patient's history has been reviewed, patient examined, no change in status, stable for surgery.  I have reviewed the patient's chart and labs.  Questions were answered to the patient's satisfaction.     Coleman Kalas C

## 2014-06-16 NOTE — Progress Notes (Signed)
Paged Amedeo Plenty MD regarding diet order. Awaiting call back

## 2014-06-16 NOTE — Progress Notes (Signed)
Hypoglycemic Event  CBG: 59  Treatment: D50 IV 50 mL  Symptoms: None  Follow-up CBG: [id number] CBG Result:121  Possible Reasons for Event: Inadequate meal intake  Comments/MD notified:    Henriette Combs  Remember to initiate Hypoglycemia Order Set & complete

## 2014-06-16 NOTE — Progress Notes (Signed)
Paged on-call provider regarding orders for PO potassium. Patient's diet orders NPO. IV potassium and PO potassium given prior to arrival on unit. Provider instructed RN to hold PO Potassium pending blood draw results. Will continue to monitor.

## 2014-06-17 ENCOUNTER — Telehealth: Payer: Self-pay | Admitting: *Deleted

## 2014-06-17 LAB — BASIC METABOLIC PANEL
ANION GAP: 5 (ref 5–15)
BUN: <5 mg/dL — ABNORMAL LOW (ref 6–23)
CALCIUM: 8.3 mg/dL — AB (ref 8.4–10.5)
CO2: 26 mmol/L (ref 19–32)
CREATININE: 0.84 mg/dL (ref 0.50–1.10)
Chloride: 108 mEq/L (ref 96–112)
GFR calc Af Amer: 82 mL/min — ABNORMAL LOW (ref >90)
GFR calc non Af Amer: 71 mL/min — ABNORMAL LOW (ref >90)
GLUCOSE: 79 mg/dL (ref 70–99)
POTASSIUM: 4.9 mmol/L (ref 3.5–5.1)
Sodium: 139 mmol/L (ref 135–145)

## 2014-06-17 LAB — CBC
HCT: 31.2 % — ABNORMAL LOW (ref 36.0–46.0)
Hemoglobin: 10.1 g/dL — ABNORMAL LOW (ref 12.0–15.0)
MCH: 32.9 pg (ref 26.0–34.0)
MCHC: 32.4 g/dL (ref 30.0–36.0)
MCV: 101.6 fL — AB (ref 78.0–100.0)
PLATELETS: 307 10*3/uL (ref 150–400)
RBC: 3.07 MIL/uL — ABNORMAL LOW (ref 3.87–5.11)
RDW: 14.3 % (ref 11.5–15.5)
WBC: 8.3 10*3/uL (ref 4.0–10.5)

## 2014-06-17 LAB — GLUCOSE, CAPILLARY: Glucose-Capillary: 77 mg/dL (ref 70–99)

## 2014-06-17 MED ORDER — PANTOPRAZOLE SODIUM 40 MG PO TBEC
40.0000 mg | DELAYED_RELEASE_TABLET | Freq: Every day | ORAL | Status: DC
  Administered 2014-06-18 – 2014-06-19 (×2): 40 mg via ORAL
  Filled 2014-06-17 (×2): qty 1

## 2014-06-17 MED ORDER — METOPROLOL SUCCINATE ER 100 MG PO TB24: 100.0000 mg | ORAL_TABLET | Freq: Every day | ORAL | Status: DC

## 2014-06-17 MED ORDER — DEXTROSE-NACL 5-0.9 % IV SOLN: INTRAVENOUS | Status: DC

## 2014-06-17 MED ORDER — METOPROLOL SUCCINATE ER 50 MG PO TB24
50.0000 mg | ORAL_TABLET | Freq: Every day | ORAL | Status: DC
  Administered 2014-06-17 – 2014-06-19 (×3): 50 mg via ORAL
  Filled 2014-06-17 (×3): qty 1

## 2014-06-17 MED ORDER — SODIUM CHLORIDE 0.9 % IV SOLN
INTRAVENOUS | Status: DC
  Administered 2014-06-17: 10:00:00 via INTRAVENOUS

## 2014-06-17 MED ORDER — ACETAMINOPHEN 325 MG PO TABS: 650.0000 mg | ORAL_TABLET | Freq: Four times a day (QID) | ORAL | Status: DC | PRN

## 2014-06-17 NOTE — Progress Notes (Signed)
[facility] PROGRESS NOTE  ANAGABRIELA JOKERST ZOX:[id number] DOB: 27-Jul-1945 DOA: 06/15/2014 PCP: Garnet Koyanagi, DO  Assessment/Plan: 1-Melena, GI bleed; S/P endoscopy which only showed hiatal hernia. If bleeding, drop in hb  persist plan is for capsule endoscopy.  Hb stable at 10--11.  Repeat hb in am.  Occult blood pending.  No significant diarrhea. Stop IV fluids.   2-Hypokalemia; in setting of diarrhea. Replete IV and oral. Resolved.   3-HTN; Follow BP trend. Resume metoprolol.   AKI; continue with IV fluids. Renal US with renal cyst. Resolved with IV fluids.   Sinus tachycardia; patient hr increase to 140 to 150 while she was agitated, upset. Hr has decrease. Will resume metoprolol.   Left arm pain; complaining of pain left arm. No significant redness swelling notice. Apply ice as needed. Will ask IV team to review site.    Code Status: full code.  Family Communication: care discussed with patient.  Disposition Plan: remain inpatient.    Consultants:  Dr Amedeo Plenty.   Procedures:  Endoscopy; hiatal hernia  Antibiotics:  none  HPI/Subjective: Patient very upset this morning. She said, they were giving me potassium with wrong equipment at [facility], they told.  She is very agitated. Hr was elevated. She is complaining of pain on the left arm. Also pain in the right arm were she had IV access.  She wants to speak with Dr Etter Sjogren or one of the [address]. She does not want to take anything from [place]. She didn't want for me to check IV site.  I came back again to see patient, she is more calm. She agree to have ice apply to arm.     Objective: Filed Vitals:   06/17/14 1316  BP: 152/74  Pulse:   Temp: 97.5 F (36.4 C)  Resp: 16    Intake/Output Summary (Last 24 hours) at 06/17/14 1321 Last data filed at 06/17/14 0730  Gross per 24 hour  Intake 1058.34 ml  Output      0 ml  Net 1058.34 ml   Filed Weights   06/15/14 2241 06/16/14 0548 06/17/14 0518   Weight: 59.6 kg (131 lb 6.3 oz) 59 kg (130 lb 1.1 oz) 64.7 kg (142 lb 10.2 oz)    Exam:   General:  Alert in no distress.   Cardiovascular: S 1, S 2, RRR  Respiratory: CTA  Abdomen: Bs present, soft, nt  Musculoskeletal: no edema. Mild bruise right arm. Left arm with IV access, no significant redness, swelling.   Data Reviewed: Basic Metabolic Panel:  Recent Labs Lab 06/15/14 1342 06/16/14 0130 06/16/14 0610 06/16/14 1127 06/16/14 1737 06/17/14 0323  NA 136  --  137  --  136 139  K 2.2* 2.2* 2.7*  --  3.4* 4.9  CL 82*  --  97  --  101 108  CO2 41*  --  29  --  27 26  GLUCOSE 103*  --  85  --  71 79  BUN 14  --  6  --  <5* <5*  CREATININE 1.46*  --  1.05  --  0.92 0.84  CALCIUM 9.2  --  8.2*  --  8.3* 8.3*  MG  --   --   --  2.2  --   --    Liver Function Tests:  Recent Labs Lab 06/15/14 1342  AST 24  ALT 14  ALKPHOS 73  BILITOT 0.3  PROT 7.2  ALBUMIN 4.0   No results for input(s):  LIPASE, AMYLASE in the last 168 hours. No results for input(s): AMMONIA in the last 168 hours. CBC:  Recent Labs Lab 06/15/14 1342 06/16/14 0135 06/16/14 0900 06/16/14 1127 06/17/14 0323  WBC 11.0* 9.1 9.0 9.0 8.3  NEUTROABS 8.3*  --   --   --   --   HGB 12.1 11.1* 10.6* 11.1* 10.1*  HCT 36.3 33.7* 31.5* 33.8* 31.2*  MCV 98.6 99.1 98.7 99.7 101.6*  PLT 408* 332 355 354 307   Cardiac Enzymes: No results for input(s): CKTOTAL, CKMB, CKMBINDEX, TROPONINI in the last 168 hours. BNP (last 3 results) No results for input(s): PROBNP in the last 8760 hours. CBG:  Recent Labs Lab 06/16/14 0803 06/16/14 0846 06/17/14 0741  GLUCAP 59* 121* 77    No results found for this or any previous visit (from the past 240 hour(s)).   Studies: Ct Abdomen Pelvis W Contrast  06/15/2014   CLINICAL DATA:  Upper abdominal pain and rectal bleeding for the past week. Nausea, vomiting and diarrhea.  EXAM: CT ABDOMEN AND PELVIS WITH CONTRAST  TECHNIQUE: Multidetector CT imaging of the  abdomen and pelvis was performed using the standard protocol following bolus administration of intravenous contrast.  CONTRAST:  29mL OMNIPAQUE IOHEXOL 300 MG/ML SOLN, 61mL OMNIPAQUE IOHEXOL 300 MG/ML SOLN  COMPARISON:  None.  FINDINGS: Mild diffuse low density of the liver relative to the spleen. Small liver cysts. Right renal cyst. Normal appearing spleen, pancreas, gallbladder, adrenal glands, left kidney and urinary bladder. Exophytic uterine fibroid anteriorly on the left, measuring 3.7 cm in maximum diameter. Grossly normal appearing ovaries. Mildly prominent stool in the colon. Normal appearing appendix. No gastric or small bowel abnormalities. Marked bilateral hip degenerative changes, greater on the left. L4-5 Ray cages. Congenital L5-S1 fusion. Small amount of tubular and patchy density in the right middle lobe on the first images. No definite lung nodule.  IMPRESSION: 1. Mildly prominent stool. 2. Mild hepatic steatosis. 3. Exophytic uterine fibroid. 4. No acute abnormality   Electronically Signed   By: Enrique Sack M.D.   On: 06/15/2014 16:21   US Renal  06/16/2014   CLINICAL DATA:  Acute kidney injury, hypertension  EXAM: RENAL/URINARY TRACT ULTRASOUND COMPLETE  COMPARISON:  CT 06/15/2014  FINDINGS: Right Kidney:  Length: 10.2 cm. Echogenicity within normal limits. 20 x 18 x 19 mm midpole cyst. No hydronephrosis.  Left Kidney:  Length: 10.3 cm. Echogenicity within normal limits. No mass or hydronephrosis visualized.  Bladder:  Appears normal for degree of bladder distention.  IMPRESSION: 1. Negative for hydronephrosis. 2. 2 cm right renal cyst.   Electronically Signed   By: Arne Cleveland M.D.   On: 06/16/2014 09:07    Scheduled Meds: . drospirenone-estradiol  1 tablet Oral Daily  . famotidine  20 mg Oral BID  . lubiprostone  24 mcg Oral BID WC  . metoprolol succinate  50 mg Oral Daily  . pantoprazole (PROTONIX) IV  40 mg Intravenous Q12H  . sodium chloride  3 mL Intravenous Q12H  .  temazepam  30 mg Oral QHS   Continuous Infusions:    Principal Problem:   Rectal bleeding Active Problems:   GERD (gastroesophageal reflux disease)   Chronic constipation   Essential hypertension, benign   Hypokalemia   AKI (acute kidney injury)    Time spent: 35 minutes.     Niel Hummer A  [facility] Pager [phone]. If 7PM-7AM, please contact night-coverage at [email or web address], password [facility] 06/17/2014, 1:21 PM  LOS: 2 days

## 2014-06-17 NOTE — Progress Notes (Signed)
Patient has repeatedly attempted to give RN $20 cash. RN explained to patient that while the thought is appreciated monetary gifts cannot be accepted by staff. Patient has repeatedly temporarily misplaced her money while handling items in her bag. Patient has slept <1 hour this shift and repeatedly rummages through her belongings and money. Medication was given to help patient sleep and RN watched patient take med. Approximately 1.5 hours later RN went to check on patient and patient reported that "I tried to take my sleep medication, but I dropped it. I searched all over for it but I found it."  RN reminded patient that med was given by RN and patient stated "maybe I need to go to sleep, maybe I'm delirious". Pt encouraged to turn tv off and try to sleep. Will continue to monitor.

## 2014-06-17 NOTE — Progress Notes (Signed)
[facility] Progress Note  Subjective: No bowel movements since yesterday morning, tolerating advancement of diet  Objective: Vital signs in last 24 hours: Temp:  [97.8 F (36.6 C)-98 F (36.7 C)] 98 F (36.7 C) (01/02 0521) Pulse Rate:  [74-82] 74 (01/02 0521) Resp:  [15-18] 18 (01/02 0521) BP: (91-118)/(46-85) 110/65 mmHg (01/02 0521) SpO2:  [93 %-100 %] 100 % (01/02 0521) Weight:  [64.7 kg (142 lb 10.2 oz)] 64.7 kg (142 lb 10.2 oz) (01/02 0518) Weight change: 2.557 kg (5 lb 10.2 oz)   PE: Abdomen soft  Lab Results: Results for orders placed or performed during the hospital encounter of 06/15/14 (from the past 24 hour(s))  Basic metabolic panel     Status: Abnormal   Collection Time: 06/16/14  5:37 PM  Result Value Ref Range   Sodium 136 135 - 145 mmol/L   Potassium 3.4 (L) 3.5 - 5.1 mmol/L   Chloride 101 96 - 112 mEq/L   CO2 27 19 - 32 mmol/L   Glucose, Bld 71 70 - 99 mg/dL   BUN <5 (L) 6 - 23 mg/dL   Creatinine, Ser 0.92 0.50 - 1.10 mg/dL   Calcium 8.3 (L) 8.4 - 10.5 mg/dL   GFR calc non Af Amer 63 (L) >90 mL/min   GFR calc Af Amer 74 (L) >90 mL/min   Anion gap 8 5 - 15  Basic metabolic panel     Status: Abnormal   Collection Time: 06/17/14  3:23 AM  Result Value Ref Range   Sodium 139 135 - 145 mmol/L   Potassium 4.9 3.5 - 5.1 mmol/L   Chloride 108 96 - 112 mEq/L   CO2 26 19 - 32 mmol/L   Glucose, Bld 79 70 - 99 mg/dL   BUN <5 (L) 6 - 23 mg/dL   Creatinine, Ser 0.84 0.50 - 1.10 mg/dL   Calcium 8.3 (L) 8.4 - 10.5 mg/dL   GFR calc non Af Amer 71 (L) >90 mL/min   GFR calc Af Amer 82 (L) >90 mL/min   Anion gap 5 5 - 15  CBC     Status: Abnormal   Collection Time: 06/17/14  3:23 AM  Result Value Ref Range   WBC 8.3 4.0 - 10.5 K/uL   RBC 3.07 (L) 3.87 - 5.11 MIL/uL   Hemoglobin 10.1 (L) 12.0 - 15.0 g/dL   HCT 31.2 (L) 36.0 - 46.0 %   MCV 101.6 (H) 78.0 - 100.0 fL   MCH 32.9 26.0 - 34.0 pg   MCHC 32.4 30.0 - 36.0 g/dL   RDW 14.3 11.5 - 15.5 %   Platelets 307 150 - 400 K/uL  Glucose, capillary     Status: None   Collection Time: 06/17/14  7:41 AM  Result Value Ref Range   Glucose-Capillary 77 70 - 99 mg/dL    Studies/Results: Ct Abdomen Pelvis W Contrast  06/15/2014   CLINICAL DATA:  Upper abdominal pain and rectal bleeding for the past week. Nausea, vomiting and diarrhea.  EXAM: CT ABDOMEN AND PELVIS WITH CONTRAST  TECHNIQUE: Multidetector CT imaging of the abdomen and pelvis was performed using the standard protocol following bolus administration of intravenous contrast.  CONTRAST:  60mL OMNIPAQUE IOHEXOL 300 MG/ML SOLN, 70mL OMNIPAQUE IOHEXOL 300 MG/ML SOLN  COMPARISON:  None.  FINDINGS: Mild diffuse low density of the liver relative to the spleen. Small liver cysts. Right renal cyst. Normal appearing spleen, pancreas, gallbladder, adrenal glands, left kidney and urinary bladder. Exophytic uterine fibroid anteriorly on the  left, measuring 3.7 cm in maximum diameter. Grossly normal appearing ovaries. Mildly prominent stool in the colon. Normal appearing appendix. No gastric or small bowel abnormalities. Marked bilateral hip degenerative changes, greater on the left. L4-5 Ray cages. Congenital L5-S1 fusion. Small amount of tubular and patchy density in the right middle lobe on the first images. No definite lung nodule.  IMPRESSION: 1. Mildly prominent stool. 2. Mild hepatic steatosis. 3. Exophytic uterine fibroid. 4. No acute abnormality   Electronically Signed   By: Enrique Sack M.D.   On: 06/15/2014 16:21   US Renal  06/16/2014   CLINICAL DATA:  Acute kidney injury, hypertension  EXAM: RENAL/URINARY TRACT ULTRASOUND COMPLETE  COMPARISON:  CT 06/15/2014  FINDINGS: Right Kidney:  Length: 10.2 cm. Echogenicity within normal limits. 20 x 18 x 19 mm midpole cyst. No hydronephrosis.  Left Kidney:  Length: 10.3 cm. Echogenicity within normal limits. No mass or hydronephrosis visualized.  Bladder:  Appears normal for degree of bladder distention.   IMPRESSION: 1. Negative for hydronephrosis. 2. 2 cm right renal cyst.   Electronically Signed   By: Arne Cleveland M.D.   On: 06/16/2014 09:07      Assessment: Dark stools with recent negative colonoscopy and EGD yesterday unrevealing. Still cannot find a charted heme-positive stool.   Plan: Continue to monitor stools and hemoglobin. 

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
## 2014-06-17 NOTE — Progress Notes (Signed)
Pt called RN to room c/o burning and pain at IV site. Site bruised with slight edema. IV fluids stopped. 2 RNs assessed. IV team consulted for new site. Will continue to monitor.

## 2014-06-17 NOTE — Progress Notes (Signed)
Patient c/o PIV sore.  PIV in left A/C WNL.Patient does not want new PIV@this  time or removal of current PIV. Also assessed by another IV RN Benito Mccreedy. Gailya Tauer,RN

## 2014-06-17 NOTE — Progress Notes (Signed)
Patient has not had any bowel movements this shift to observe.

## 2014-06-17 NOTE — Progress Notes (Signed)
On-call provider notified of K level 4.9. KCl fluids have not been restarted since infiltration. Will await instruction from provider before hanging new bag of fluids. Will continue to monitor.

## 2014-06-18 LAB — BASIC METABOLIC PANEL
Anion gap: 9 (ref 5–15)
CO2: 23 mmol/L (ref 19–32)
Calcium: 9.3 mg/dL (ref 8.4–10.5)
Chloride: 108 mEq/L (ref 96–112)
Creatinine, Ser: 0.83 mg/dL (ref 0.50–1.10)
GFR calc Af Amer: 83 mL/min — ABNORMAL LOW (ref >90)
GFR calc non Af Amer: 72 mL/min — ABNORMAL LOW (ref >90)
Glucose, Bld: 77 mg/dL (ref 70–99)
Potassium: 4.2 mmol/L (ref 3.5–5.1)
Sodium: 140 mmol/L (ref 135–145)

## 2014-06-18 LAB — CBC
HCT: 40.4 % (ref 36.0–46.0)
HEMOGLOBIN: 13.6 g/dL (ref 12.0–15.0)
MCH: 32.7 pg (ref 26.0–34.0)
MCHC: 33.7 g/dL (ref 30.0–36.0)
MCV: 97.1 fL (ref 78.0–100.0)
Platelets: UNDETERMINED 10*3/uL (ref 150–400)
RBC: 4.16 MIL/uL (ref 3.87–5.11)
RDW: 14.1 % (ref 11.5–15.5)
WBC: 8.7 10*3/uL (ref 4.0–10.5)

## 2014-06-18 LAB — GLUCOSE, CAPILLARY
GLUCOSE-CAPILLARY: 108 mg/dL — AB (ref 70–99)
GLUCOSE-CAPILLARY: 72 mg/dL (ref 70–99)

## 2014-06-18 NOTE — Progress Notes (Signed)
[facility] Progress Note  Subjective: Patient agitated about all sorts of things but has not had any stool since yesterday. Hemoglobin is relatively stable  Objective: Vital signs in last 24 hours: Temp:  [98.8 F (37.1 C)-99.4 F (37.4 C)] 99.4 F (37.4 C) (01/03 0514) Pulse Rate:  [90-92] 90 (01/03 0942) Resp:  [16-18] 18 (01/03 0514) BP: (130-133)/(76) 130/76 mmHg (01/03 0942) SpO2:  [100 %] 100 % (01/03 0514) Weight change:    PE: Unchanged  Lab Results: Results for orders placed or performed during the hospital encounter of 06/15/14 (from the past 24 hour(s))  Glucose, capillary     Status: Abnormal   Collection Time: 06/18/14  7:37 AM  Result Value Ref Range   Glucose-Capillary 108 (H) 70 - 99 mg/dL  Glucose, capillary     Status: None   Collection Time: 06/18/14 11:59 AM  Result Value Ref Range   Glucose-Capillary 72 70 - 99 mg/dL  CBC     Status: None (Preliminary result)   Collection Time: 06/18/14 12:08 PM  Result Value Ref Range   WBC PENDING 4.0 - 10.5 K/uL   RBC 4.16 3.87 - 5.11 MIL/uL   Hemoglobin 13.6 12.0 - 15.0 g/dL   HCT 40.4 36.0 - 46.0 %   MCV 97.1 78.0 - 100.0 fL   MCH 32.7 26.0 - 34.0 pg   MCHC 33.7 30.0 - 36.0 g/dL   RDW 14.1 11.5 - 15.5 %   Platelets PENDING 150 - 400 K/uL  Basic metabolic panel     Status: Abnormal   Collection Time: 06/18/14 12:08 PM  Result Value Ref Range   Sodium 140 135 - 145 mmol/L   Potassium 4.2 3.5 - 5.1 mmol/L   Chloride 108 96 - 112 mEq/L   CO2 23 19 - 32 mmol/L   Glucose, Bld 77 70 - 99 mg/dL   BUN <5 (L) 6 - 23 mg/dL   Creatinine, Ser 0.83 0.50 - 1.10 mg/dL   Calcium 9.3 8.4 - 10.5 mg/dL   GFR calc non Af Amer 72 (L) >90 mL/min   GFR calc Af Amer 83 (L) >90 mL/min   Anion gap 9 5 - 15    Studies/Results: No results found.    Assessment: Dark heme positive stools with apparent Lynch syndrome and last colonoscopy 2 months ago, EGD yesterday showing only hiatal hernia  Plan: Discussed  small bowel capsule endoscopy which I think would be reasonable given her family history compatible with Lynch syndrome. She would like to wait and talk to her husband about it so we'll hold off for now and readdress tomorrow.    Kaspian Muccio C 06/18/2014, 1:23 PM
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
## 2014-06-18 NOTE — Progress Notes (Signed)
Per patient, no BMs this shift.

## 2014-06-18 NOTE — Progress Notes (Addendum)
[facility] PROGRESS NOTE  Brandi [name] WUX:[id number] DOB: 08-12-1945 DOA: 06/15/2014 PCP: Garnet Koyanagi, DO  Assessment/Plan: 1-Melena, GI bleed; S/P endoscopy which only showed hiatal hernia. If bleeding, drop in hb  persist plan is for capsule endoscopy.  Hb stable at 10--11.  Repeat hb in am.  Occult blood pending.  No significant diarrhea. Stop IV fluids.  Follow hb trend.   2-Hypokalemia; in setting of diarrhea. Replete IV and oral. Resolved.   3-HTN; Follow BP trend. Resume metoprolol.   AKI; continue with IV fluids. Renal US with renal cyst. Resolved with IV fluids.   Sinus tachycardia; patient hr increase to 140 to 150 while she was agitated, upset. Hr has decrease.  resume metoprolol. Improved.   Left arm pain; complaining of pain left arm. No significant redness swelling notice. Apply ice as needed.   AMS: Patient anxious, irritated, hyperactive. I asked neurology for evaluation, patient dint one to see the neurology.   Addendum:  I came back  and spoke with patient 's husband. He say that she is just upset with the care and communication that she has been getting. She has not been able to rest. She is not different than baseline.  He wants to speak with Dr Etter Sjogren. I recommend MRI to rule out stroke. Husband  decline MRI brain, or neurology evaluation.  He was present when at [facility] said that they were using the wrong machine to give potassium to the patient.  Updated husband regarding plan for work up for GI bleed. Repeat Hb in am and Dr Amedeo Plenty will discussed with him and patient regarding need for capsule endoscopy.   Code Status: full code.  Family Communication: care discussed with patient.  Disposition Plan: remain inpatient.    Consultants:  Dr Amedeo Plenty.   Procedures:  Endoscopy; hiatal hernia  Antibiotics:  none  HPI/Subjective: Patient continue to be agitated, she is getting upset with multiples things. She speak from one  topic to another.  Family told nurse on 1-02 that this is not patient baseline been anxious and aggiated. They were worry that maybe medications was causing this " pain medications" .  Per Nurse patient [name] :I am going to ask my husband to send the "[name] " after that Network engineer. The secretary try to stop her last night from going to the machine to get crackers.  Patient got upset with me because I asked if she feels more anxious than usual. She told me that is a racist question.  On 1-02 Patient was very anxious, her HR increase to 140, she was very upset with what happens at [facility] with the potassium. She was also complaining of pain in her left arm. She was asking to speak with her PCP. She was declining care at that time.     Objective: Filed Vitals:   06/18/14 0942  BP: 130/76  Pulse: 90  Temp:   Resp:     Intake/Output Summary (Last 24 hours) at 06/18/14 1339 Last data filed at 06/18/14 0512  Gross per 24 hour  Intake    444 ml  Output      0 ml  Net    444 ml   Filed Weights   06/15/14 2241 06/16/14 0548 06/17/14 0518  Weight: 59.6 kg (131 lb 6.3 oz) 59 kg (130 lb 1.1 oz) 64.7 kg (142 lb 10.2 oz)    Exam:   General:  Alert in no distress.   Cardiovascular: S 1,  S 2, RRR  Respiratory: CTA  Abdomen: Bs present, soft, nt  Musculoskeletal: no edema. Mild bruise right arm. Left arm with IV access, no significant redness, swelling.   Data Reviewed: Basic Metabolic Panel:  Recent Labs Lab 06/15/14 1342 06/16/14 0130 06/16/14 0610 06/16/14 1127 06/16/14 1737 06/17/14 0323 06/18/14 1208  NA 136  --  137  --  136 139 140  K 2.2* 2.2* 2.7*  --  3.4* 4.9 4.2  CL 82*  --  97  --  101 108 108  CO2 41*  --  29  --  27 26 23   GLUCOSE 103*  --  85  --  71 79 77  BUN 14  --  6  --  <5* <5* <5*  CREATININE 1.46*  --  1.05  --  0.92 0.84 0.83  CALCIUM 9.2  --  8.2*  --  8.3* 8.3* 9.3  MG  --   --   --  2.2  --   --   --    Liver Function  Tests:  Recent Labs Lab 06/15/14 1342  AST 24  ALT 14  ALKPHOS 73  BILITOT 0.3  PROT 7.2  ALBUMIN 4.0   No results for input(s): LIPASE, AMYLASE in the last 168 hours. No results for input(s): AMMONIA in the last 168 hours. CBC:  Recent Labs Lab 06/15/14 1342 06/16/14 0135 06/16/14 0900 06/16/14 1127 06/17/14 0323 06/18/14 1208  WBC 11.0* 9.1 9.0 9.0 8.3 PENDING  NEUTROABS 8.3*  --   --   --   --   --   HGB 12.1 11.1* 10.6* 11.1* 10.1* 13.6  HCT 36.3 33.7* 31.5* 33.8* 31.2* 40.4  MCV 98.6 99.1 98.7 99.7 101.6* 97.1  PLT 408* 332 355 354 307 PENDING   Cardiac Enzymes: No results for input(s): CKTOTAL, CKMB, CKMBINDEX, TROPONINI in the last 168 hours. BNP (last 3 results) No results for input(s): PROBNP in the last 8760 hours. CBG:  Recent Labs Lab 06/16/14 0803 06/16/14 0846 06/17/14 0741 06/18/14 0737 06/18/14 1159  GLUCAP 59* 121* 77 108* 72    No results found for this or any previous visit (from the past 240 hour(s)).   Studies: No results found.  Scheduled Meds: . famotidine  20 mg Oral BID  . lubiprostone  24 mcg Oral BID WC  . metoprolol succinate  50 mg Oral Daily  . pantoprazole  40 mg Oral Daily  . sodium chloride  3 mL Intravenous Q12H  . temazepam  30 mg Oral QHS   Continuous Infusions:    Principal Problem:   Rectal bleeding Active Problems:   GERD (gastroesophageal reflux disease)   Chronic constipation   Essential hypertension, benign   Hypokalemia   AKI (acute kidney injury)    Time spent: 35 minutes.     Niel Hummer A  [facility] Pager [phone]. If 7PM-7AM, please contact night-coverage at [email or web address], password [facility] 06/18/2014, 1:39 PM  LOS: 3 days

## 2014-06-18 NOTE — Progress Notes (Signed)
Pt has been scattered this shift. She was complaining of having burning in her IV site from the K+ she received at [facility]. Became very anxious and upset. Stated at one point that her husband was going to make his 12 hr drive from [place] an 8 hour drive just to get here to see what was being done to her. She then said that her husband was here in town and she had no idea where that story about him being in [place] had come from. She has been trying to give money to the staff and states that she doesn't care about money and that it is hers she will do with it what she likes. Pt also refused medication and care several times this shift stating that her doctor told her not to take anything that she did not prescribe. Spoke with pt husband and sister separately. Asked them both if pt behavior was baseline for her. Both told me that this is not her norm and that they think that the medication she has taken is causing her to act this way. MD made aware.

## 2014-06-19 ENCOUNTER — Encounter (HOSPITAL_COMMUNITY): Payer: Self-pay | Admitting: Gastroenterology

## 2014-06-19 LAB — GLUCOSE, CAPILLARY: GLUCOSE-CAPILLARY: 101 mg/dL — AB (ref 70–99)

## 2014-06-19 MED ORDER — OXYCODONE-ACETAMINOPHEN 5-325 MG PO TABS: 1.0000 | ORAL_TABLET | Freq: Four times a day (QID) | ORAL | Status: DC | PRN

## 2014-06-19 MED ORDER — OMEPRAZOLE 40 MG PO CPDR: 40.0000 mg | DELAYED_RELEASE_CAPSULE | Freq: Every day | ORAL | Status: DC

## 2014-06-19 NOTE — Progress Notes (Signed)
Patient ID: Brandi [name]  female  VQX:[id number]    DOB: [birth year]    DOA: 06/15/2014  PCP: Garnet Koyanagi, DO  Brief history of present illness Patient is a 69 year old female with hypertension, back pain, GERD, colon polyps presented with rectal bleeding, weight loss. Patient has significant family history of colon cancer. She reported chronic and intermittent rectal bleeding in the past 20 years. She also reported black stools in the past 1 week.  Workup in the ED showed hemoglobin of 12.1, potassium 2.2, creatinine 1.46. CT abdomen and pelvis showed mild hepatic steatosis. She was admitted for further treatment. GI was consulted.  Patient underwent endoscopy on 1/1 which showed small hiatal hernia otherwise normal stomach, duodenum  Since 06/17/14, patient has been somewhat confused and agitated, patient and her husband declined MRI brain or neurology evaluation.  Assessment/Plan: Principal Problem:   Rectal bleeding/ melena/GI bleed - GI following, underwent endoscopy on 1/1, showed small hiatal hernia otherwise normal stomach, duodenum - No significant diarrhea, GI following, planning capsule endoscopy if further drop in hemoglobin - Continue PPI  - Ordered CBC for today  Active Problems:   GERD (gastroesophageal reflux disease) - Continue PPI    Essential hypertension, benign - Continue metoprolol    Hypokalemia - Resolved, recheck BMET today    AKI (acute kidney injury) - Resolved  Altered mental status - On examination today, still very anxious and irritated, states that 'the machine burnt me, the hospital administrators apologized to me". She refused to talk to me, asking " If Dr Etter Sjogren sent you?' Patient states that she will not talk to anyone as instructed by her PCP until she gets back from [place].  I was able to contact Dr. Etter Sjogren who reported that this is not patient's baseline. She will call into the patient's room and explain her that Dr. Etter Sjogren cannot see her in  the hospital and allow the hospital staff and physician to take care of her. - I have also discontinued morphine, oxycodone. For now, will continue only trazodone qhs PRN to sleep.  DVT Prophylaxis:  Code Status: Full code  Family Communication:  Disposition:  Consultants:  Gastroenterology  Procedures:  Endoscopy  Antibiotics:  None    Subjective: Patient seen and examined, very irritated and anxious. I am just sore everywhere.   Objective: Weight change:   Intake/Output Summary (Last 24 hours) at 06/19/14 1115 Last data filed at 06/19/14 0929  Gross per 24 hour  Intake    462 ml  Output      0 ml  Net    462 ml   Blood pressure 130/72, pulse 78, temperature 98.4 F (36.9 C), temperature source Oral, resp. rate 18, height 5\' 4"  (1.626 m), weight 64.7 kg (142 lb 10.2 oz), SpO2 100 %.  Physical Exam: General: Alert and awake, oriented x3, irritated CVS: S1-S2 clear, no murmur rubs or gallops Chest: clear to auscultation bilaterally, no wheezing, rales or rhonchi Abdomen: soft nontender, nondistended, normal bowel sounds  Extremities: no cyanosis, clubbing or edema noted bilaterally Neuro: Cranial nerves II-XII intact, no focal neurological deficits  Lab Results: Basic Metabolic Panel:  Recent Labs Lab 06/16/14 1127  06/17/14 0323 06/18/14 1208  NA  --   < > 139 140  K  --   < > 4.9 4.2  CL  --   < > 108 108  CO2  --   < > 26 23  GLUCOSE  --   < > 79 77  BUN  --   < > <5* <5*  CREATININE  --   < > 0.84 0.83  CALCIUM  --   < > 8.3* 9.3  MG 2.2  --   --   --   < > = values in this interval not displayed. Liver Function Tests:  Recent Labs Lab 06/15/14 1342  AST 24  ALT 14  ALKPHOS 73  BILITOT 0.3  PROT 7.2  ALBUMIN 4.0   No results for input(s): LIPASE, AMYLASE in the last 168 hours. No results for input(s): AMMONIA in the last 168 hours. CBC:  Recent Labs Lab 06/15/14 1342  06/17/14 0323 06/18/14 1208  WBC 11.0*  < > 8.3 8.7    NEUTROABS 8.3*  --   --   --   HGB 12.1  < > 10.1* 13.6  HCT 36.3  < > 31.2* 40.4  MCV 98.6  < > 101.6* 97.1  PLT 408*  < > 307 PLATELET CLUMPS NOTED ON SMEAR, UNABLE TO ESTIMATE  < > = values in this interval not displayed. Cardiac Enzymes: No results for input(s): CKTOTAL, CKMB, CKMBINDEX, TROPONINI in the last 168 hours. BNP: Invalid input(s): POCBNP CBG:  Recent Labs Lab 06/16/14 0846 06/17/14 0741 06/18/14 0737 06/18/14 1159 06/19/14 0739  GLUCAP 121* 77 108* 72 101*     Micro Results: No results found for this or any previous visit (from the past 240 hour(s)).  Studies/Results: Ct Abdomen Pelvis W Contrast  06/15/2014   CLINICAL DATA:  Upper abdominal pain and rectal bleeding for the past week. Nausea, vomiting and diarrhea.  EXAM: CT ABDOMEN AND PELVIS WITH CONTRAST  TECHNIQUE: Multidetector CT imaging of the abdomen and pelvis was performed using the standard protocol following bolus administration of intravenous contrast.  CONTRAST:  60mL OMNIPAQUE IOHEXOL 300 MG/ML SOLN, 49mL OMNIPAQUE IOHEXOL 300 MG/ML SOLN  COMPARISON:  None.  FINDINGS: Mild diffuse low density of the liver relative to the spleen. Small liver cysts. Right renal cyst. Normal appearing spleen, pancreas, gallbladder, adrenal glands, left kidney and urinary bladder. Exophytic uterine fibroid anteriorly on the left, measuring 3.7 cm in maximum diameter. Grossly normal appearing ovaries. Mildly prominent stool in the colon. Normal appearing appendix. No gastric or small bowel abnormalities. Marked bilateral hip degenerative changes, greater on the left. L4-5 Ray cages. Congenital L5-S1 fusion. Small amount of tubular and patchy density in the right middle lobe on the first images. No definite lung nodule.  IMPRESSION: 1. Mildly prominent stool. 2. Mild hepatic steatosis. 3. Exophytic uterine fibroid. 4. No acute abnormality   Electronically Signed   By: Enrique Sack M.D.   On: 06/15/2014 16:21   US  Renal  06/16/2014   CLINICAL DATA:  Acute kidney injury, hypertension  EXAM: RENAL/URINARY TRACT ULTRASOUND COMPLETE  COMPARISON:  CT 06/15/2014  FINDINGS: Right Kidney:  Length: 10.2 cm. Echogenicity within normal limits. 20 x 18 x 19 mm midpole cyst. No hydronephrosis.  Left Kidney:  Length: 10.3 cm. Echogenicity within normal limits. No mass or hydronephrosis visualized.  Bladder:  Appears normal for degree of bladder distention.  IMPRESSION: 1. Negative for hydronephrosis. 2. 2 cm right renal cyst.   Electronically Signed   By: Arne Cleveland M.D.   On: 06/16/2014 09:07    Medications: Scheduled Meds: . famotidine  20 mg Oral BID  . lubiprostone  24 mcg Oral BID WC  . metoprolol succinate  50 mg Oral Daily  . pantoprazole  40 mg Oral Daily  . sodium chloride  3 mL Intravenous Q12H  . temazepam  30 mg Oral QHS      LOS: 4 days   RAI,RIPUDEEP M.D. [facility] 06/19/2014, 11:15 AM Pager: [phone]  If 7PM-7AM, please contact night-coverage [email or web address] Password [email or web address]

## 2014-06-19 NOTE — Progress Notes (Signed)
Patient ID: Brandi [name], female   DOB: 1945-07-30, 69 y.o.   MRN: [id number] [facility] Gastroenterology Progress Note  [name] 69 y.o. 1946-05-05   Subjective: No complaints. No BMs overnight. Husband at bedside.  Objective: Vital signs: Filed Vitals:   06/19/14 0528  BP: 130/72  Pulse: 78  Temp: 98.4 F (36.9 C)  Resp: 18    Physical Exam: Gen: alert, no acute distress  Lab Results:  Recent Labs  06/17/14 0323 06/18/14 1208  NA 139 140  K 4.9 4.2  CL 108 108  CO2 26 23  GLUCOSE 79 77  BUN <5* <5*  CREATININE 0.84 0.83  CALCIUM 8.3* 9.3   No results for input(s): AST, ALT, ALKPHOS, BILITOT, PROT, ALBUMIN in the last 72 hours.  Recent Labs  06/17/14 0323 06/18/14 1208  WBC 8.3 8.7  HGB 10.1* 13.6  HCT 31.2* 40.4  MCV 101.6* 97.1  PLT 307 PLATELET CLUMPS NOTED ON SMEAR, UNABLE TO ESTIMATE      Assessment/Plan: S/P GI bleed - no further bleeding. Hgb stable. No further inpt GI workup needed at this time. Ok to d/c from GI standpoint.   [name] C. 06/19/2014, 12:12 PM

## 2014-06-19 NOTE — Care Management Note (Signed)
    Page 1 of 1   06/19/2014     12:24:43 PM CARE MANAGEMENT NOTE 06/19/2014  Patient:  Brandi [name], Brandi [name]   Account Number:  [id number]  Date Initiated:  06/19/2014  Documentation initiated by:  Tomi Bamberger  Subjective/Objective Assessment:   dx gib  admit- lives with spouse     Action/Plan:   Anticipated DC Date:  06/19/2014   Anticipated DC Plan:  [place]  CM consult      Choice offered to / List presented to:             Status of service:  Completed, signed off Medicare Important Message given?  YES (If response is "NO", the following Medicare IM given date fields will be blank) Date Medicare IM given:  06/19/2014 Medicare IM given by:  Tomi Bamberger Date Additional Medicare IM given:   Additional Medicare IM given by:    Discharge Disposition:  HOME/SELF CARE  Per UR Regulation:  Reviewed for med. necessity/level of care/duration of stay  If discussed at [place] of Stay Meetings, dates discussed:    Comments:  06/19/14 [place], BSN [phone] patient is for dc today, no needs anticipated.

## 2014-06-19 NOTE — Telephone Encounter (Signed)
Noted  

## 2014-06-19 NOTE — Telephone Encounter (Signed)
FYI

## 2014-06-19 NOTE — Telephone Encounter (Signed)
I spoke with the patient over the weekend and and RN covering at the time.   We can not do anything when the pt is inpatient.  The RN was going to get the Dr covering the pt to talk to her.

## 2014-06-19 NOTE — Progress Notes (Signed)
Nsg Discharge Note  Admit Date:  06/15/2014 Discharge date: 06/19/2014   Brandi [name] to be D/C'd Home per MD order.  AVS completed.  Copy for chart, and copy for patient signed, and dated. Patient/caregiver able to verbalize understanding.  Discharge Medication:   Medication List    TAKE these medications        diclofenac sodium 1 % Gel  Commonly known as:  VOLTAREN  Apply 2 g topically 4 (four) times daily.     drospirenone-estradiol 0.5-1 MG per tablet  Commonly known as:  ANGELIQ  Take 1 tablet by mouth daily.     lubiprostone 24 MCG capsule  Commonly known as:  AMITIZA  Take 1 capsule (24 mcg total) by mouth 2 (two) times daily with a meal.     metoprolol succinate 50 MG 24 hr tablet  Commonly known as:  TOPROL-XL  Take 2 tablets (100 mg total) by mouth daily. Take with or immediately following a meal.     omeprazole 40 MG capsule  Commonly known as:  PRILOSEC  Take 1 capsule (40 mg total) by mouth daily.     oxyCODONE-acetaminophen 7.5-325 MG per tablet  Commonly known as:  PERCOCET  Take 1 tablet by mouth every 4 (four) hours as needed for pain.     ranitidine 150 MG tablet  Commonly known as:  ZANTAC  Take 1 tablet (150 mg total) by mouth 2 (two) times daily.     temazepam 30 MG capsule  Commonly known as:  RESTORIL  1 po qhs prn sleep     traZODone 50 MG tablet  Commonly known as:  DESYREL  1-2 po qhs prn        Discharge Assessment: Filed Vitals:   06/19/14 0528  BP: 130/72  Pulse: 78  Temp: 98.4 F (36.9 C)  Resp: 18   Skin clean, dry and intact without evidence of skin break down, no evidence of skin tears noted. IV catheter discontinued intact. Site without signs and symptoms of complications - no redness or edema noted at insertion site, patient denies c/o pain - only slight tenderness at site.  Dressing with slight pressure applied.  D/c Instructions-Education: Discharge instructions given to patient/family with verbalized  understanding. D/c education completed with patient/family including follow up instructions, medication list, d/c activities limitations if indicated, with other d/c instructions as indicated by MD - patient able to verbalize understanding, all questions fully answered. Patient instructed to return to ED, call 911, or call MD for any changes in condition.  Patient escorted via [place], and D/C home via private auto.  Dayle Points, RN 06/19/2014 1:45 PM

## 2014-06-19 NOTE — Discharge Summary (Signed)
Physician Discharge Summary  Patient ID: Brandi [name] MRN: [id number] DOB/AGE: [birth year] 69 y.o.  Admit date: 06/15/2014 Discharge date: 06/19/2014  Primary Care Physician:  Garnet Koyanagi, DO  Discharge Diagnoses:    Marland Kitchen  Melena with upper GI bleed  . Hypokalemia . GERD (gastroesophageal reflux disease) . Essential hypertension, benign . Chronic constipation .  AKI (acute kidney injury)  Consults:  Gastroenterology, Dr. Michail Sermon   Recommendations for Outpatient Follow-up:  Patient is recommended outpatient GI referral for capsule endoscopy or colonoscopy with gastroenterologist of her choice.     DIET: Heart healthy diet    Allergies:   Allergies  Allergen Reactions  . Aspirin     Stomach ulcer     Discharge Medications:   Medication List    TAKE these medications        diclofenac sodium 1 % Gel  Commonly known as:  VOLTAREN  Apply 2 g topically 4 (four) times daily.     drospirenone-estradiol 0.5-1 MG per tablet  Commonly known as:  ANGELIQ  Take 1 tablet by mouth daily.     lubiprostone 24 MCG capsule  Commonly known as:  AMITIZA  Take 1 capsule (24 mcg total) by mouth 2 (two) times daily with a meal.     metoprolol succinate 50 MG 24 hr tablet  Commonly known as:  TOPROL-XL  Take 2 tablets (100 mg total) by mouth daily. Take with or immediately following a meal.     omeprazole 40 MG capsule  Commonly known as:  PRILOSEC  Take 1 capsule (40 mg total) by mouth daily.     oxyCODONE-acetaminophen 7.5-325 MG per tablet  Commonly known as:  PERCOCET  Take 1 tablet by mouth every 4 (four) hours as needed for pain.     ranitidine 150 MG tablet  Commonly known as:  ZANTAC  Take 1 tablet (150 mg total) by mouth 2 (two) times daily.     temazepam 30 MG capsule  Commonly known as:  RESTORIL  1 po qhs prn sleep     traZODone 50 MG tablet  Commonly known as:  DESYREL  1-2 po qhs prn         Brief H and P: For complete details please refer to  admission H and P, but in brief patient is a 69 year old female with hypertension, back pain, GERD, colon polyps presented with rectal bleeding, weight loss. She has significant family history of colon cancer. Patient reported chronic and intermittent rectal bleeding in the past 20 years. She also reported black stools in the past week. Workup in the ED showed hemoglobin of 12.1, potassium 2.2, creatinine 1.46. CT abdomen and pelvis showed mild hepatic steatosis. She was admitted for further treatment. GI was consulted.  Hospital Course:     Rectal bleeding/melena/GI bleed No significant diarrhea or any rectal bleeding during hospitalization. Gastroenterology was consulted. Patient underwent endoscopy on 06/16/14 which showed small hiatal hernia otherwise normal stomach, duodenum. Gastroenterology recommended capsule endoscopy if further drop in the hemoglobin however patient's hemoglobin remained stable and she had no rectal bleeding. Patient was reminded to continue PPI. Hemoglobin 13.6 at discharge. Pertinent history tetralogy, Dr. Michail Sermon, no further inpatient GI workup is needed and patient is okay to be discharged.       GERD (gastroesophageal reflux disease) - Continue PPI    Essential hypertension, benign - Currently stable    Hypokalemia -Stable     AKI (acute kidney injury)Resolved  Confusion, agitation/acute encephalopathy Since 06/17/14, patient was noted  to be confused and agitated. She was recommended MRI of the brain and neurology evaluation to rule out any stroke however patient and her husband declined. Patient demonstrated paranoid behavior during hospitalization and requested her own physician/PCP to be taking care of her. I was able to contact Dr. Etter Sjogren who also reported that patient was frustrated about the "burning from potassium". Patient was able to calm down after her physician, Dr Etter Sjogren talked to her. I explained GI recommendations to her and patient was able to  comprehend fine. She felt comfortable to be discharged home and she will follow up with GI outpatient.  Day of Discharge BP 130/72 mmHg  Pulse 78  Temp(Src) 98.4 F (36.9 C) (Oral)  Resp 18  Ht 5\' 4"  (1.626 m)  Wt 64.7 kg (142 lb 10.2 oz)  BMI 24.47 kg/m2  SpO2 100%  Physical Exam: General: Alert and awake oriented x3 not in any acute distress. CVS: S1-S2 clear no murmur rubs or gallops Chest: clear to auscultation bilaterally, no wheezing rales or rhonchi Abdomen: soft nontender, nondistended, normal bowel sounds Extremities: no cyanosis, clubbing or edema noted bilaterally Neuro: Cranial nerves II-XII intact, no focal neurological deficits   The results of significant diagnostics from this hospitalization (including imaging, microbiology, ancillary and laboratory) are listed below for reference.    LAB RESULTS: Basic Metabolic Panel:  Recent Labs Lab 06/16/14 1127  06/17/14 0323 06/18/14 1208  NA  --   < > 139 140  K  --   < > 4.9 4.2  CL  --   < > 108 108  CO2  --   < > 26 23  GLUCOSE  --   < > 79 77  BUN  --   < > <5* <5*  CREATININE  --   < > 0.84 0.83  CALCIUM  --   < > 8.3* 9.3  MG 2.2  --   --   --   < > = values in this interval not displayed. Liver Function Tests:  Recent Labs Lab 06/15/14 1342  AST 24  ALT 14  ALKPHOS 73  BILITOT 0.3  PROT 7.2  ALBUMIN 4.0   No results for input(s): LIPASE, AMYLASE in the last 168 hours. No results for input(s): AMMONIA in the last 168 hours. CBC:  Recent Labs Lab 06/15/14 1342  06/17/14 0323 06/18/14 1208  WBC 11.0*  < > 8.3 8.7  NEUTROABS 8.3*  --   --   --   HGB 12.1  < > 10.1* 13.6  HCT 36.3  < > 31.2* 40.4  MCV 98.6  < > 101.6* 97.1  PLT 408*  < > 307 PLATELET CLUMPS NOTED ON SMEAR, UNABLE TO ESTIMATE  < > = values in this interval not displayed. Cardiac Enzymes: No results for input(s): CKTOTAL, CKMB, CKMBINDEX, TROPONINI in the last 168 hours. BNP: Invalid input(s): POCBNP CBG:  Recent  Labs Lab 06/18/14 1159 06/19/14 0739  GLUCAP 72 101*    Significant Diagnostic Studies:  Ct Abdomen Pelvis W Contrast  06/15/2014   CLINICAL DATA:  Upper abdominal pain and rectal bleeding for the past week. Nausea, vomiting and diarrhea.  EXAM: CT ABDOMEN AND PELVIS WITH CONTRAST  TECHNIQUE: Multidetector CT imaging of the abdomen and pelvis was performed using the standard protocol following bolus administration of intravenous contrast.  CONTRAST:  22mL OMNIPAQUE IOHEXOL 300 MG/ML SOLN, 29mL OMNIPAQUE IOHEXOL 300 MG/ML SOLN  COMPARISON:  None.  FINDINGS: Mild diffuse low density of the liver relative  to the spleen. Small liver cysts. Right renal cyst. Normal appearing spleen, pancreas, gallbladder, adrenal glands, left kidney and urinary bladder. Exophytic uterine fibroid anteriorly on the left, measuring 3.7 cm in maximum diameter. Grossly normal appearing ovaries. Mildly prominent stool in the colon. Normal appearing appendix. No gastric or small bowel abnormalities. Marked bilateral hip degenerative changes, greater on the left. L4-5 Ray cages. Congenital L5-S1 fusion. Small amount of tubular and patchy density in the right middle lobe on the first images. No definite lung nodule.  IMPRESSION: 1. Mildly prominent stool. 2. Mild hepatic steatosis. 3. Exophytic uterine fibroid. 4. No acute abnormality   Electronically Signed   By: Enrique Sack M.D.   On: 06/15/2014 16:21   US Renal  06/16/2014   CLINICAL DATA:  Acute kidney injury, hypertension  EXAM: RENAL/URINARY TRACT ULTRASOUND COMPLETE  COMPARISON:  CT 06/15/2014  FINDINGS: Right Kidney:  Length: 10.2 cm. Echogenicity within normal limits. 20 x 18 x 19 mm midpole cyst. No hydronephrosis.  Left Kidney:  Length: 10.3 cm. Echogenicity within normal limits. No mass or hydronephrosis visualized.  Bladder:  Appears normal for degree of bladder distention.  IMPRESSION: 1. Negative for hydronephrosis. 2. 2 cm right renal cyst.   Electronically Signed    By: Arne Cleveland M.D.   On: 06/16/2014 09:07      Disposition and Follow-up:     Discharge Instructions    Diet - low sodium heart healthy    Complete by:  As directed      Increase activity slowly    Complete by:  As directed             DISPOSITION: home   DISCHARGE FOLLOW-UP Follow-up Information    Follow up with Garnet Koyanagi, DO. Schedule an appointment as soon as possible for a visit in 10 days.   Specialty:  Family Medicine   Why:  for hospital follow-up   Contact information:   [place] [id number] [phone]        Time spent on Discharge: 29 mins  Signed:   RAI,RIPUDEEP M.D. [facility] 06/19/2014, 12:42 PM Pager: [phone]

## 2014-06-21 ENCOUNTER — Telehealth: Payer: Self-pay

## 2014-06-21 ENCOUNTER — Telehealth: Payer: Self-pay | Admitting: Family Medicine

## 2014-06-21 NOTE — Telephone Encounter (Signed)
Patient Name: Brandi [name]  DOB: [birth year]    Nurse Assessment  Nurse: Mallie Mussel, RN, Alveta Heimlich Date/Time Eilene Ghazi Time): 06/21/2014 10:28:26 AM  Confirm and document reason for call. If symptomatic, describe symptoms. ---Caller states that she was admitted to the hospital for rectal bleeding. She has had bleeding for 2 weeks. She was discharged from the hospital on Monday and she is still having rectal bleeding. She is not passing blood without stool. She is passing black tarry stools. She feels weak. She was told they were unable to find the source of the bleeding. At the time of admission, her main problem was the hypokalemia. They did not address the rectal bleeding.  Has the patient traveled out of the country within the last 30 days? ---No  Does the patient require triage? ---Yes  Related visit to physician within the last 2 weeks? ---Yes  Does the PT have any chronic conditions? (i.e. diabetes, asthma, etc.) ---No     Guidelines    Guideline Title Affirmed Question Affirmed Notes  Rectal Bleeding Severe dizziness (e.g., unable to stand, requires support to walk, feels like passing out now) Has to have help to walk now.   Final Disposition User   Go to ED Now Mallie Mussel, RN, Alveta Heimlich    Comments  After agreeing to go, she states that she wants to be seen by Dr. Etter Sjogren before going to ER.  I tried to schedule an appointment for the caller as she requested, only to find out that we are not scheduling appointments for this group as of yet. I called the number to schedule an appointment for her. I spoke with Colletta Maryland and advised her of the situation. She wanted me to speak with one of their triage nurses. I was transferred to [facility] who states that Dr. Amedeo Plenty did address the rectal bleeding. Gaye wanted me to transfer her to the caller. Caller did not reply when I made the connection. Dannielle Karvonen states she will give her a call.

## 2014-06-21 NOTE — Telephone Encounter (Signed)
See notations under phone encounter.

## 2014-06-21 NOTE — Telephone Encounter (Signed)
Agree, thank you

## 2014-06-21 NOTE — Telephone Encounter (Signed)
Patient called in to office and was transferred to [facility] with c/o rectal bleeding. Per their guidelines patient needs to be seen in ED. Spoke with patient who states that she is experiencing rectal bleeding and is very weak. So weak that she has to have someone help her walk. Patient request appt with PCP. Advised that she should go to the ED for evaluation now. Patient agrees with plan.   Sent to PCP for [facility]

## 2014-06-22 DIAGNOSIS — M25552 Pain in left hip: Secondary | ICD-10-CM | POA: Diagnosis not present

## 2014-06-29 ENCOUNTER — Encounter: Payer: Self-pay | Admitting: Family Medicine

## 2014-06-29 ENCOUNTER — Ambulatory Visit (INDEPENDENT_AMBULATORY_CARE_PROVIDER_SITE_OTHER): Payer: Medicare Other | Admitting: Family Medicine

## 2014-06-29 ENCOUNTER — Ambulatory Visit: Payer: Medicare Other | Admitting: Family Medicine

## 2014-06-29 VITALS — BP 150/82 | HR 96 | Temp 98.6°F | Resp 18 | Wt 135.2 lb

## 2014-06-29 DIAGNOSIS — G8929 Other chronic pain: Secondary | ICD-10-CM

## 2014-06-29 DIAGNOSIS — N951 Menopausal and female climacteric states: Secondary | ICD-10-CM

## 2014-06-29 DIAGNOSIS — J302 Other seasonal allergic rhinitis: Secondary | ICD-10-CM | POA: Diagnosis not present

## 2014-06-29 DIAGNOSIS — G4700 Insomnia, unspecified: Secondary | ICD-10-CM | POA: Diagnosis not present

## 2014-06-29 DIAGNOSIS — K219 Gastro-esophageal reflux disease without esophagitis: Secondary | ICD-10-CM

## 2014-06-29 DIAGNOSIS — K625 Hemorrhage of anus and rectum: Secondary | ICD-10-CM

## 2014-06-29 DIAGNOSIS — R232 Flushing: Secondary | ICD-10-CM

## 2014-06-29 MED ORDER — PANTOPRAZOLE SODIUM 40 MG PO TBEC: 40.0000 mg | DELAYED_RELEASE_TABLET | Freq: Every day | ORAL | Status: DC

## 2014-06-29 MED ORDER — TEMAZEPAM 30 MG PO CAPS
ORAL_CAPSULE | ORAL | Status: DC
Start: 2014-06-29 — End: 2014-08-08

## 2014-06-29 MED ORDER — TRAZODONE HCL 50 MG PO TABS: ORAL_TABLET | ORAL | Status: DC

## 2014-06-29 MED ORDER — CONJ ESTROG-MEDROXYPROGEST ACE 0.3-1.5 MG PO TABS: 1.0000 | ORAL_TABLET | Freq: Every day | ORAL | Status: DC

## 2014-06-29 MED ORDER — OXYCODONE-ACETAMINOPHEN 7.5-325 MG PO TABS: 1.0000 | ORAL_TABLET | ORAL | Status: DC | PRN

## 2014-06-29 NOTE — Progress Notes (Signed)
   Subjective:    Patient ID: Brandi [name], female    DOB: 1946/01/19, 69 y.o.   MRN: [id number]  HPI  pt is here to f/u hospital-  For rectal bleed --she was sent back to hospital but she did not go.  No abd pain.  No feve, chills     Review of Systems  Constitutional: Negative for fever, chills, diaphoresis, activity change, appetite change, fatigue and unexpected weight change.  Cardiovascular: Negative for chest pain and palpitations.  Gastrointestinal: Positive for abdominal pain and blood in stool. Negative for nausea, vomiting, diarrhea, constipation, abdominal distention and rectal pain.  Genitourinary: Negative.        Objective:   Physical Exam BP 150/82 mmHg  Pulse 96  Temp(Src) 98.6 F (37 C) (Oral)  Resp 18  Wt 135 lb 3.2 oz (61.326 kg)  SpO2 99% General appearance: alert, cooperative, appears stated age and no distress Throat: lips, mucosa, and tongue normal; teeth and gums normal Neck: no adenopathy, no carotid bruit, no JVD, supple, symmetrical, trachea midline and thyroid not enlarged, symmetric, no tenderness/mass/nodules Lungs: clear to auscultation bilaterally Heart: regular rate and rhythm, S1, S2 normal, no murmur, click, rub or gallop Abdomen: soft, non-tender; bowel sounds normal; no masses,  no organomegaly Extremities: extremities normal, atraumatic, no cyanosis or edema Pulses: 2+ and symmetric       Assessment & Plan:  1. Rectal bleed If symptoms worsen--- go to ER--- she refused to go last week Check guaic cards - Ambulatory referral to Gastroenterology - Basic metabolic panel - CBC with Differential - Hepatic function panel  2. Insomnia  - temazepam (RESTORIL) 30 MG capsule; 1 po qhs prn sleep  Dispense: 30 capsule; Refill: 0  3. Chronic pain   - oxyCODONE-acetaminophen (PERCOCET) 7.5-325 MG per tablet; Take 1 tablet by mouth every 4 (four) hours as needed for pain.  Dispense: 120 tablet; Refill: 0  4. Seasonal  allergies Antihistamine, steroid nasal spray  5. Hot flashes Pt requesting to go back to prempro---she understands risks and benefits. - estrogen, conjugated,-medroxyprogesterone (PREMPRO) 0.3-1.5 MG per tablet; Take 1 tablet by mouth daily.  Dispense: 30 tablet; Refill: 5  6. Gastroesophageal reflux disease without esophagitis Refer to gi  - pantoprazole (PROTONIX) 40 MG tablet; Take 1 tablet (40 mg total) by mouth daily.  Dispense: 30 tablet; Refill: 3

## 2014-06-29 NOTE — Progress Notes (Signed)
Pre visit review using our clinic review tool, if applicable. No additional management support is needed unless otherwise documented below in the visit note. 

## 2014-06-29 NOTE — Patient Instructions (Addendum)
Rectal Bleeding °Rectal bleeding is when blood passes out of the anus. It is usually a sign that something is wrong. It may not be serious, but it should always be evaluated. Rectal bleeding may present as bright red blood or extremely dark stools. The color may range from dark red or maroon to black (like tar). It is important that the cause of rectal bleeding be identified so treatment can be started and the problem corrected. °CAUSES  °· Hemorrhoids. These are enlarged (dilated) blood vessels or veins in the anal or rectal area. °· Fistulas. These are abnormal, burrowing channels that usually run from inside the rectum to the skin around the anus. They can bleed. °· Anal fissures. This is a tear in the tissue of the anus. Bleeding occurs with bowel movements. °· Diverticulosis. This is a condition in which pockets or sacs project from the bowel wall. Occasionally, the sacs can bleed. °· Diverticulitis. This is an infection involving diverticulosis of the colon. °· Proctitis and colitis. These are conditions in which the rectum, colon, or both, can become inflamed and pitted (ulcerated). °· Polyps and cancer. Polyps are non-cancerous (benign) growths in the colon that may bleed. Certain types of polyps turn into cancer. °· Protrusion of the rectum. Part of the rectum can project from the anus and bleed. °· Certain medicines. °· Intestinal infections. °· Blood vessel abnormalities. °HOME CARE INSTRUCTIONS °· Eat a high-fiber diet to keep your stool soft. °· Limit activity. °· Drink enough fluids to keep your urine clear or pale yellow. °· Warm baths may be useful to soothe rectal pain. °· Follow up with your caregiver as directed. °SEEK IMMEDIATE MEDICAL CARE IF: °· You develop increased bleeding. °· You have black or dark red stools. °· You vomit blood or material that looks like coffee grounds. °· You have abdominal pain or tenderness. °· You have a fever. °· You feel weak, nauseous, or you faint. °· You have  severe rectal pain or you are unable to have a bowel movement. °MAKE SURE YOU: °· Understand these instructions. °· Will watch your condition. °· Will get help right away if you are not doing well or get worse. °Document Released: 11/22/2001 Document Revised: 08/25/2011 Document Reviewed: 11/17/2010 °ExitCare® Patient Information ©2015 [employer]. This information is not intended to replace advice given to you by your health care provider. Make sure you discuss any questions you have with your health care provider. ° °

## 2014-06-30 LAB — BASIC METABOLIC PANEL
BUN: 10 mg/dL (ref 6–23)
CALCIUM: 9.9 mg/dL (ref 8.4–10.5)
CO2: 30 meq/L (ref 19–32)
CREATININE: 0.96 mg/dL (ref 0.40–1.20)
Chloride: 99 mEq/L (ref 96–112)
GFR: 74.62 mL/min (ref >60.00)
GLUCOSE: 98 mg/dL (ref 70–99)
POTASSIUM: 3.2 meq/L — AB (ref 3.5–5.1)
Sodium: 138 mEq/L (ref 135–145)

## 2014-06-30 LAB — HEPATIC FUNCTION PANEL
ALBUMIN: 4 g/dL (ref 3.5–5.2)
ALK PHOS: 73 U/L (ref 39–117)
ALT: 11 U/L (ref 0–35)
AST: 17 U/L (ref 0–37)
BILIRUBIN TOTAL: 0.5 mg/dL (ref 0.2–1.2)
Bilirubin, Direct: 0.2 mg/dL (ref 0.0–0.3)
Total Protein: 8.1 g/dL (ref 6.0–8.3)

## 2014-06-30 LAB — CBC WITH DIFFERENTIAL/PLATELET
BASOS ABS: 0.1 10*3/uL (ref 0.0–0.1)
Basophils Relative: 0.5 % (ref 0.0–3.0)
EOS ABS: 0.1 10*3/uL (ref 0.0–0.7)
Eosinophils Relative: 0.5 % (ref 0.0–5.0)
HCT: 41.7 % (ref 36.0–46.0)
HEMOGLOBIN: 13.6 g/dL (ref 12.0–15.0)
Lymphocytes Relative: 10.5 % — ABNORMAL LOW (ref 12.0–46.0)
Lymphs Abs: 1.6 10*3/uL (ref 0.7–4.0)
MCHC: 32.5 g/dL (ref 30.0–36.0)
MCV: 100 fl (ref 78.0–100.0)
MONOS PCT: 6.9 % (ref 3.0–12.0)
Monocytes Absolute: 1 10*3/uL (ref 0.1–1.0)
Neutro Abs: 12.3 10*3/uL — ABNORMAL HIGH (ref 1.4–7.7)
Neutrophils Relative %: 81.6 % — ABNORMAL HIGH (ref 43.0–77.0)
PLATELETS: 436 10*3/uL — AB (ref 150.0–400.0)
RBC: 4.16 Mil/uL (ref 3.87–5.11)
RDW: 15.3 % (ref 11.5–15.5)
WBC: 15.1 10*3/uL — ABNORMAL HIGH (ref 4.0–10.5)

## 2014-07-06 ENCOUNTER — Telehealth: Payer: Self-pay

## 2014-07-06 DIAGNOSIS — R197 Diarrhea, unspecified: Secondary | ICD-10-CM

## 2014-07-06 DIAGNOSIS — D72829 Elevated white blood cell count, unspecified: Secondary | ICD-10-CM

## 2014-07-06 NOTE — Telephone Encounter (Signed)
Ct+ stool , otherwise normal [place]-- essentially normal---  + cyst R kidney---- I need to know from radiologist if it is simple or complex

## 2014-07-06 NOTE — Telephone Encounter (Signed)
-----   Message from Rosalita Chessman, DO sent at 07/05/2014  1:47 PM EST ----- Potassium is low-- take KCL 20 meq 1 a day#30 2 refills White count is elevated-- any signs of infection,  Respiratory, urine, abd pain---if still having diarrhea---check c diff and stool culture Recheck bmp, cbcd-- 2 weeks--- get stool culture / c diff this week if possible

## 2014-07-06 NOTE — Telephone Encounter (Signed)
Spoke with and gave her results and she voiced understanding, she will come into the office on Monday to pick up the supplies for the stool culture and repeat the CBC-D, she is still having the rectal bleeding and said the hospital never gave her the results of the CT scan or Renal US. Please advise     KP

## 2014-07-10 NOTE — Telephone Encounter (Signed)
Dr. Vernard Gambles said that it appears to be a benign simple cyst.  No recommendations for further imaging.

## 2014-07-10 NOTE — Telephone Encounter (Signed)
Patient wants to know what to do about the cyst on the kidney.      KP

## 2014-07-10 NOTE — Telephone Encounter (Signed)
We may not have to do anything but watch it.  I need to know however if the cyst is simple/ complex .  Would you please call radiology and see if they can have the radiologist tell us that--- so I know what f/u is needed.?

## 2014-07-10 NOTE — Telephone Encounter (Signed)
Because it is a simple cyst-- we do not need to do anything

## 2014-07-10 NOTE — Telephone Encounter (Signed)
Patient has been made aware and verbalized understanding.      KP

## 2014-07-13 ENCOUNTER — Telehealth: Payer: Self-pay | Admitting: Family Medicine

## 2014-07-13 MED ORDER — ESCITALOPRAM OXALATE 10 MG PO TABS: 10.0000 mg | ORAL_TABLET | Freq: Every day | ORAL | Status: DC

## 2014-07-13 MED ORDER — LORAZEPAM 0.5 MG PO TABS: 0.5000 mg | ORAL_TABLET | Freq: Three times a day (TID) | ORAL | Status: DC | PRN

## 2014-07-13 NOTE — Telephone Encounter (Signed)
Caller name: Brandi [name], Brandi [name] Relation to pt: self  Call back number: [phone] Pharmacy:  Reason for call:  Pt states  (RESTORIL) she has been taking 2 pills a day instead of what the MD ordered. Pt states if she takes 1 pill a day she will only sleep an hour. Pt states she can not function. Please advise

## 2014-07-13 NOTE — Telephone Encounter (Signed)
Patient has been made aware and verbalized understanding. She said thank you.      KP

## 2014-07-13 NOTE — Telephone Encounter (Signed)
It is from anxiety--- she is on the max dose of restoril---- any more is an overdose  lexapro 10 mg 1 po qd #30  2 refills  Ativan 0.5 mg 1 po tid prn  #30

## 2014-07-13 NOTE — Telephone Encounter (Signed)
Patient stated that she has been unable to stay asleep due to bad dreams about her recent injury at [place]. She has been taking 2 Restoril, she wanted to know if you can increase the quantity or give her something to help her relax. Please advise     KP

## 2014-07-14 DIAGNOSIS — M25531 Pain in right wrist: Secondary | ICD-10-CM | POA: Diagnosis not present

## 2014-07-18 ENCOUNTER — Other Ambulatory Visit: Payer: Medicare Other

## 2014-07-19 ENCOUNTER — Other Ambulatory Visit: Payer: Medicare Other

## 2014-07-24 ENCOUNTER — Telehealth: Payer: Self-pay | Admitting: Family Medicine

## 2014-07-24 MED ORDER — DIAZEPAM 5 MG PO TABS: 5.0000 mg | ORAL_TABLET | Freq: Two times a day (BID) | ORAL | Status: DC | PRN

## 2014-07-24 NOTE — Addendum Note (Signed)
Addended by: Ewing Schlein on: 07/24/2014 05:12 PM   Modules accepted: Orders

## 2014-07-24 NOTE — Telephone Encounter (Signed)
Caller name: Bricelyn Relation to pt: self Call back number: [phone] Pharmacy:  Reason for call:   Patient is requesting a copy of last lab results mailed to her.

## 2014-07-24 NOTE — Telephone Encounter (Signed)
Patient changed her mind. Would like to come by and pick up lab results.

## 2014-07-24 NOTE — Telephone Encounter (Signed)
D/c ativan---bring in Valium 5 mg 1 po bid prn #30

## 2014-07-24 NOTE — Telephone Encounter (Signed)
Patient aware Rx is being sent and she has agreed to bring the Ativan.     KP

## 2014-07-24 NOTE — Telephone Encounter (Signed)
Copy of labs printed and left at check in. Patient aware.      KP

## 2014-07-24 NOTE — Telephone Encounter (Signed)
Patient made me aware that the Lorazepam is causing her to have nightmares, she has been dreaming that her arms have been on fire. She is requesting to get something different, she said she has tried the Diazepam in the past and it did fine. Please advise     KP

## 2014-07-25 ENCOUNTER — Telehealth: Payer: Self-pay | Admitting: Internal Medicine

## 2014-08-08 ENCOUNTER — Telehealth: Payer: Self-pay | Admitting: Family Medicine

## 2014-08-08 DIAGNOSIS — G8929 Other chronic pain: Secondary | ICD-10-CM

## 2014-08-08 DIAGNOSIS — G4700 Insomnia, unspecified: Secondary | ICD-10-CM

## 2014-08-08 MED ORDER — TEMAZEPAM 30 MG PO CAPS: ORAL_CAPSULE | ORAL | Status: DC

## 2014-08-08 MED ORDER — TRAZODONE HCL 50 MG PO TABS: ORAL_TABLET | ORAL | Status: DC

## 2014-08-08 MED ORDER — OXYCODONE-ACETAMINOPHEN 7.5-325 MG PO TABS: 1.0000 | ORAL_TABLET | ORAL | Status: DC | PRN

## 2014-08-08 NOTE — Telephone Encounter (Signed)
Caller name:Brandi [name] Relationship to patient:self Can be reached:[phone] Pharmacy: [facility] [address]  Reason for call: PT requesting refills of  Percocet 7.5 MG Restoril 30 MG Trazodone 50 MG

## 2014-08-08 NOTE — Telephone Encounter (Signed)
Patient aware Rx ready for pick up.      KP 

## 2014-08-08 NOTE — Telephone Encounter (Signed)
Last seen and filled Oxycodone 06/29/13 #120 Restoril 06/29/14 #30 Trazodone was discontinued on 06/29/14     Please advise     KP

## 2014-08-08 NOTE — Telephone Encounter (Signed)
Refill x1 

## 2014-08-14 ENCOUNTER — Other Ambulatory Visit (INDEPENDENT_AMBULATORY_CARE_PROVIDER_SITE_OTHER): Payer: Medicare Other

## 2014-08-14 DIAGNOSIS — K625 Hemorrhage of anus and rectum: Secondary | ICD-10-CM

## 2014-08-14 LAB — HEMOCCULT GUIAC POC 1CARD (OFFICE)
Card #3 Fecal Occult Blood, POC: NEGATIVE
FECAL OCCULT BLD: NEGATIVE
Fecal Occult Blood, POC: NEGATIVE

## 2014-08-18 ENCOUNTER — Ambulatory Visit: Payer: Medicare Other | Admitting: Family Medicine

## 2014-08-23 DIAGNOSIS — Z79890 Hormone replacement therapy (postmenopausal): Secondary | ICD-10-CM | POA: Diagnosis not present

## 2014-08-23 DIAGNOSIS — Z800 Family history of malignant neoplasm of digestive organs: Secondary | ICD-10-CM | POA: Diagnosis not present

## 2014-08-23 DIAGNOSIS — J301 Allergic rhinitis due to pollen: Secondary | ICD-10-CM | POA: Diagnosis not present

## 2014-08-23 DIAGNOSIS — J343 Hypertrophy of nasal turbinates: Secondary | ICD-10-CM | POA: Diagnosis not present

## 2014-08-23 DIAGNOSIS — J3089 Other allergic rhinitis: Secondary | ICD-10-CM | POA: Diagnosis not present

## 2014-08-23 DIAGNOSIS — Z981 Arthrodesis status: Secondary | ICD-10-CM | POA: Diagnosis not present

## 2014-08-23 DIAGNOSIS — J300 Vasomotor rhinitis: Secondary | ICD-10-CM | POA: Diagnosis not present

## 2014-08-23 DIAGNOSIS — Z79899 Other long term (current) drug therapy: Secondary | ICD-10-CM | POA: Diagnosis not present

## 2014-08-23 DIAGNOSIS — J309 Allergic rhinitis, unspecified: Secondary | ICD-10-CM | POA: Diagnosis not present

## 2014-08-23 DIAGNOSIS — Z8719 Personal history of other diseases of the digestive system: Secondary | ICD-10-CM | POA: Diagnosis not present

## 2014-08-23 DIAGNOSIS — Z888 Allergy status to other drugs, medicaments and biological substances status: Secondary | ICD-10-CM | POA: Diagnosis not present

## 2014-08-23 DIAGNOSIS — J3489 Other specified disorders of nose and nasal sinuses: Secondary | ICD-10-CM | POA: Diagnosis not present

## 2014-09-01 ENCOUNTER — Ambulatory Visit (INDEPENDENT_AMBULATORY_CARE_PROVIDER_SITE_OTHER): Payer: Medicare Other | Admitting: Family Medicine

## 2014-09-01 ENCOUNTER — Encounter: Payer: Self-pay | Admitting: Family Medicine

## 2014-09-01 VITALS — BP 160/120 | HR 134 | Temp 99.5°F | Wt 143.2 lb

## 2014-09-01 DIAGNOSIS — G8929 Other chronic pain: Secondary | ICD-10-CM

## 2014-09-01 DIAGNOSIS — F419 Anxiety disorder, unspecified: Secondary | ICD-10-CM | POA: Diagnosis not present

## 2014-09-01 DIAGNOSIS — R21 Rash and other nonspecific skin eruption: Secondary | ICD-10-CM

## 2014-09-01 DIAGNOSIS — L659 Nonscarring hair loss, unspecified: Secondary | ICD-10-CM

## 2014-09-01 MED ORDER — PREDNISONE 10 MG PO TABS: ORAL_TABLET | ORAL | Status: DC

## 2014-09-01 MED ORDER — METHYLPREDNISOLONE ACETATE 80 MG/ML IJ SUSP
80.0000 mg | Freq: Once | INTRAMUSCULAR | Status: AC
  Administered 2014-09-01: 80 mg via INTRAMUSCULAR

## 2014-09-01 MED ORDER — DIAZEPAM 10 MG PO TABS: 10.0000 mg | ORAL_TABLET | Freq: Two times a day (BID) | ORAL | Status: DC | PRN

## 2014-09-01 MED ORDER — ESCITALOPRAM OXALATE 20 MG PO TABS: 20.0000 mg | ORAL_TABLET | Freq: Every day | ORAL | Status: DC

## 2014-09-01 MED ORDER — OXYCODONE-ACETAMINOPHEN 7.5-325 MG PO TABS: 1.0000 | ORAL_TABLET | ORAL | Status: DC | PRN

## 2014-09-01 NOTE — Progress Notes (Signed)
Pre visit review using our clinic review tool, if applicable. No additional management support is needed unless otherwise documented below in the visit note. 

## 2014-09-01 NOTE — Patient Instructions (Signed)

## 2014-09-01 NOTE — Progress Notes (Signed)
  Subjective:     Brandi [name] is a 69 y.o. female who presents for evaluation of a rash involving the trunk. Rash started 8 weeks ago. Lesions are pink, and raised in texture. Rash has changed over time. Rash is pruritic. Associated symptoms: none. Patient denies: abdominal pain, arthralgia, congestion, cough, crankiness, decrease in appetite, decrease in energy level, fever, headache, irritability, myalgia, nausea, sore throat and vomiting. Patient has not had contacts with similar rash. Patient has not had new exposures (soaps, lotions, laundry detergents, foods, medications, plants, insects or animals).  The following portions of the patient's history were reviewed and updated as appropriate:  She  has a past medical history of Osteoarthritis; Chronic back pain; History of gastric ulcer; Constipation; Anesthesia complication; Shoulder pain; and Hypertension. She  does not have any pertinent problems on file. She  has past surgical history that includes Foot surgery; Lumbar fusion; Shoulder surgery; Root canal (11/01/13); and Esophagogastroduodenoscopy (N/A, 06/16/2014). Her family history includes Cancer in her father and mother; Cancer (age of onset: 79) in her sister. There is no history of Heart disease. She  reports that she has never smoked. She has never used smokeless tobacco. She reports that she does not drink alcohol or use illicit drugs. She has a current medication list which includes the following prescription(s): diazepam, diclofenac sodium, escitalopram, estrogen (conjugated)-medroxyprogesterone, lubiprostone, metoprolol succinate, oxycodone-acetaminophen, pantoprazole, ranitidine, temazepam, and trazodone. Current Outpatient Prescriptions on File Prior to Visit  Medication Sig Dispense Refill  . diazepam (VALIUM) 5 MG tablet Take 1 tablet (5 mg total) by mouth 2 (two) times daily as needed for anxiety. 30 tablet 0  . diclofenac sodium (VOLTAREN) 1 % GEL Apply 2 g topically 4 (four)  times daily. 100 g 1  . escitalopram (LEXAPRO) 10 MG tablet Take 1 tablet (10 mg total) by mouth daily. 30 tablet 2  . estrogen, conjugated,-medroxyprogesterone (PREMPRO) 0.3-1.5 MG per tablet Take 1 tablet by mouth daily. 30 tablet 5  . lubiprostone (AMITIZA) 24 MCG capsule Take 1 capsule (24 mcg total) by mouth 2 (two) times daily with a meal. 60 capsule 2  . metoprolol succinate (TOPROL-XL) 50 MG 24 hr tablet Take 2 tablets (100 mg total) by mouth daily. Take with or immediately following a meal. 30 tablet 2  . oxyCODONE-acetaminophen (PERCOCET) 7.5-325 MG per tablet Take 1 tablet by mouth every 4 (four) hours as needed for pain. 120 tablet 0  . pantoprazole (PROTONIX) 40 MG tablet Take 1 tablet (40 mg total) by mouth daily. 30 tablet 3  . ranitidine (ZANTAC) 150 MG tablet Take 1 tablet (150 mg total) by mouth 2 (two) times daily. 180 tablet 3  . temazepam (RESTORIL) 30 MG capsule 1 po qhs prn sleep 30 capsule 0  . traZODone (DESYREL) 50 MG tablet 1 po qhs prn 30 tablet 1   No current facility-administered medications on file prior to visit.   She is allergic to aspirin..  Review of Systems Pertinent items are noted in HPI.    Objective:    BP 160/120 mmHg  Pulse 134  Temp(Src) 99.5 F (37.5 C) (Oral)  Wt 143 lb 3.2 oz (64.955 kg)  SpO2 97% General:  alert, cooperative, appears stated age and no distress  Skin:  rash noted on trunk     + thinning hair   Assessment:    diffuse alopecia areata    Plan:    Medications: none. Written patient instruction given. Follow up in a few weeks. -prn

## 2014-09-05 ENCOUNTER — Telehealth: Payer: Self-pay | Admitting: Family Medicine

## 2014-09-05 DIAGNOSIS — G4700 Insomnia, unspecified: Secondary | ICD-10-CM

## 2014-09-05 NOTE — Telephone Encounter (Signed)
Caller name:Kolakowski Naje Relation to OF:HQRF Call back Orient:  Reason for call: pt would like for you to call her regarding her medications states she has questions about her valium and wants to know if she can take another medication.

## 2014-09-06 NOTE — Telephone Encounter (Signed)
i would really like her to see a sleep specialist if she is having that much trouble sleeping.   Refer to neuro for insomnia--- i don't think our neuro does that --- if I'm right refer to [place]

## 2014-09-06 NOTE — Telephone Encounter (Signed)
Call from patient and she is wanting to know if she could alternate between the Temazepam and the Diazepam to help her sleep and if so she is requesting a refill on the temazepam.    Please advise       KP

## 2014-09-07 NOTE — Telephone Encounter (Signed)
Referral has been placed.     KP 

## 2014-09-11 ENCOUNTER — Ambulatory Visit (INDEPENDENT_AMBULATORY_CARE_PROVIDER_SITE_OTHER): Payer: Medicare Other | Admitting: Family Medicine

## 2014-09-11 ENCOUNTER — Encounter: Payer: Self-pay | Admitting: Family Medicine

## 2014-09-11 VITALS — BP 136/84 | HR 79 | Temp 98.0°F | Wt 133.2 lb

## 2014-09-11 DIAGNOSIS — G8929 Other chronic pain: Secondary | ICD-10-CM

## 2014-09-11 DIAGNOSIS — J0110 Acute frontal sinusitis, unspecified: Secondary | ICD-10-CM | POA: Diagnosis not present

## 2014-09-11 MED ORDER — AMOXICILLIN-POT CLAVULANATE 875-125 MG PO TABS: 1.0000 | ORAL_TABLET | Freq: Two times a day (BID) | ORAL | Status: DC

## 2014-09-11 MED ORDER — OXYCODONE-ACETAMINOPHEN 7.5-325 MG PO TABS: 1.0000 | ORAL_TABLET | ORAL | Status: DC | PRN

## 2014-09-11 NOTE — Progress Notes (Signed)
Pre visit review using our clinic review tool, if applicable. No additional management support is needed unless otherwise documented below in the visit note. 

## 2014-09-11 NOTE — Progress Notes (Signed)
  Subjective:     Brandi [name] is a 69 y.o. female here for evaluation of a cough. Onset of symptoms was 1 month ago. Symptoms have been gradually worsening since that time. The cough is productive and is aggravated by reclining position. Associated symptoms include: postnasal drip, shortness of breath, sputum production and wheezing. Patient does not have a history of asthma. Patient does have a history of environmental allergens. Patient has not traveled recently. Patient does not have a history of smoking. Patient has not had a previous chest x-ray. Patient has not had a PPD done.  The following portions of the patient's history were reviewed and updated as appropriate: allergies, current medications, past family history, past medical history, past social history, past surgical history and problem list.  Review of Systems Pertinent items are noted in HPI.    Objective:    Oxygen saturation 98% on room air BP 136/84 mmHg  Pulse 79  Temp(Src) 98 F (36.7 C) (Oral)  Wt 133 lb 3.2 oz (60.419 kg)  SpO2 98% General appearance: alert, cooperative, appears stated age and no distress Ears: normal TM's and external ear canals both ears Nose: green discharge, moderate congestion, turbinates red, swollen, sinus tenderness bilateral Throat: abnormal findings: moderate oropharyngeal erythema Neck: mild anterior cervical adenopathy, supple, symmetrical, trachea midline and thyroid not enlarged, symmetric, no tenderness/mass/nodules Lungs: clear to auscultation bilaterally Heart: S1, S2 normal    Assessment:    Sinusitis    Plan:    Antibiotics per medication orders. Avoid exposure to tobacco smoke and fumes. Call if shortness of breath worsens, blood in sputum, change in character of cough, development of fever or chills, inability to maintain nutrition and hydration. Avoid exposure to tobacco smoke and fumes. Trial of steroid nasal spray.

## 2014-09-11 NOTE — Patient Instructions (Signed)

## 2014-10-05 ENCOUNTER — Telehealth: Payer: Self-pay | Admitting: Family Medicine

## 2014-10-05 DIAGNOSIS — G8929 Other chronic pain: Secondary | ICD-10-CM

## 2014-10-05 DIAGNOSIS — G4700 Insomnia, unspecified: Secondary | ICD-10-CM

## 2014-10-05 DIAGNOSIS — F419 Anxiety disorder, unspecified: Secondary | ICD-10-CM

## 2014-10-05 NOTE — Telephone Encounter (Signed)
Caller name: Abryanna Relation to pt: Self Call back number: [phone] [id number] [place], [address]  Reason for call: Pt called requesting rx oxyCODONE-acetaminophen (PERCOCET) 7.5-325 MG per tablet, temazepam (RESTORIL) 30 MG capsule takes 2 a day (requested 60 but would be ok with 30 since has a lot of insomia) and diazepam (VALIUM) 10 MG tablet. Pt is leaving next week to [place]. Please advise.

## 2014-10-06 NOTE — Telephone Encounter (Signed)
Patient has been made aware and she voiced understanding.    KP

## 2014-10-06 NOTE — Telephone Encounter (Signed)
Last seen 09/11/14 and filled:  Oxycodone filled 09/11/14 #120 Valium filled 09/01/14 #30 with 1 refill Temazepam 08/08/14 #30  UDS 03/07/14 low risk    Please advise      KP

## 2014-10-06 NOTE — Telephone Encounter (Signed)
Too early for both

## 2014-10-10 MED ORDER — TEMAZEPAM 30 MG PO CAPS: ORAL_CAPSULE | ORAL | Status: DC

## 2014-10-10 MED ORDER — DIAZEPAM 10 MG PO TABS: 10.0000 mg | ORAL_TABLET | Freq: Two times a day (BID) | ORAL | Status: DC | PRN

## 2014-10-10 MED ORDER — OXYCODONE-ACETAMINOPHEN 7.5-325 MG PO TABS: 1.0000 | ORAL_TABLET | ORAL | Status: DC | PRN

## 2014-10-10 NOTE — Telephone Encounter (Signed)
Please advise      KP 

## 2014-10-10 NOTE — Addendum Note (Signed)
Addended by: Ewing Schlein on: 10/10/2014 03:18 PM   Modules accepted: Orders

## 2014-10-10 NOTE — Telephone Encounter (Signed)
tomorrow

## 2014-10-10 NOTE — Telephone Encounter (Signed)
Patient aware Rx will be ready tomorrow.      KP

## 2014-10-10 NOTE — Telephone Encounter (Signed)
yes

## 2014-10-10 NOTE — Telephone Encounter (Signed)
Patient is wanting to know when she can get these prescriptions. Best # [phone]

## 2014-10-10 NOTE — Telephone Encounter (Signed)
Can we print today and have her pick up tomorrow since you are off?      KP

## 2014-10-30 DIAGNOSIS — M160 Bilateral primary osteoarthritis of hip: Secondary | ICD-10-CM | POA: Diagnosis not present

## 2014-10-30 DIAGNOSIS — M545 Low back pain: Secondary | ICD-10-CM | POA: Diagnosis not present

## 2014-10-30 DIAGNOSIS — M1611 Unilateral primary osteoarthritis, right hip: Secondary | ICD-10-CM | POA: Diagnosis not present

## 2014-11-02 ENCOUNTER — Telehealth: Payer: Self-pay | Admitting: Family Medicine

## 2014-11-02 DIAGNOSIS — R232 Flushing: Secondary | ICD-10-CM

## 2014-11-02 MED ORDER — CONJ ESTROG-MEDROXYPROGEST ACE 0.3-1.5 MG PO TABS: 1.0000 | ORAL_TABLET | Freq: Every day | ORAL | Status: DC

## 2014-11-02 NOTE — Telephone Encounter (Signed)
Rx faxed.    KP 

## 2014-11-02 NOTE — Telephone Encounter (Signed)
Caller name: Mckenzee, Beem Relation to pt: self Call back number: [phone] Pharmacy:  [id number] - [place], [address]  Reason for call:   Pt states she misplaced estrogen, conjugated,-medroxyprogesterone (PREMPRO) 0.3-1.5 MG per tablet requesting [facility] [phone]. Pt states her hot flashes are out of control

## 2014-11-06 ENCOUNTER — Telehealth: Payer: Self-pay | Admitting: Family Medicine

## 2014-11-06 DIAGNOSIS — G894 Chronic pain syndrome: Secondary | ICD-10-CM

## 2014-11-06 MED ORDER — DICLOFENAC SODIUM 1 % TD GEL: 2.0000 g | Freq: Four times a day (QID) | TRANSDERMAL | Status: DC

## 2014-11-06 NOTE — Telephone Encounter (Signed)
Rx faxed.    KP 

## 2014-11-06 NOTE — Telephone Encounter (Signed)
Caller name: Libertie Hausler Relationship to patient: self Can be reached: [phone], home # [facility] [address]  Reason for call: pt needs refill on  diclofenac sodium (VOLTAREN) 1 % GEL [[id number]]         Pharmacy:  [id number] - [place], [address]

## 2014-11-07 ENCOUNTER — Telehealth: Payer: Self-pay | Admitting: Family Medicine

## 2014-11-07 DIAGNOSIS — M1990 Unspecified osteoarthritis, unspecified site: Secondary | ICD-10-CM | POA: Diagnosis not present

## 2014-11-07 DIAGNOSIS — M79671 Pain in right foot: Secondary | ICD-10-CM | POA: Diagnosis not present

## 2014-11-07 DIAGNOSIS — F419 Anxiety disorder, unspecified: Secondary | ICD-10-CM

## 2014-11-07 DIAGNOSIS — G4700 Insomnia, unspecified: Secondary | ICD-10-CM

## 2014-11-07 NOTE — Telephone Encounter (Signed)
OK to give 30 day rx for 3 meds requested

## 2014-11-07 NOTE — Telephone Encounter (Signed)
Last seen 09/11/14  Trazodone 08/08/14 #30 with 1 rf Temazepam 10/10/14 #30 No rf Diazepam 10/10/14 #30 No rf  UDS 03/07/14 low risk   Please advise      KP

## 2014-11-07 NOTE — Telephone Encounter (Signed)
Caller name:Veta Munger Relationship to patient Can be reached:[phone] [facility] [address]  Reason for call: Requesting med refill on trazodone, temazepam and  dizepam

## 2014-11-08 MED ORDER — TEMAZEPAM 30 MG PO CAPS: ORAL_CAPSULE | ORAL | Status: DC

## 2014-11-08 MED ORDER — DIAZEPAM 10 MG PO TABS
10.0000 mg | ORAL_TABLET | Freq: Two times a day (BID) | ORAL | Status: DC | PRN
Start: 2014-11-08 — End: 2015-01-05

## 2014-11-08 MED ORDER — TRAZODONE HCL 50 MG PO TABS
ORAL_TABLET | ORAL | Status: DC
Start: 2014-11-08 — End: 2014-12-26

## 2014-11-08 NOTE — Telephone Encounter (Signed)
Rx signed by Dr.Lowne.      KP

## 2014-11-14 ENCOUNTER — Telehealth: Payer: Self-pay | Admitting: Family Medicine

## 2014-11-14 DIAGNOSIS — G8929 Other chronic pain: Secondary | ICD-10-CM

## 2014-11-14 MED ORDER — OXYCODONE-ACETAMINOPHEN 7.5-325 MG PO TABS: 1.0000 | ORAL_TABLET | ORAL | Status: DC | PRN

## 2014-11-14 NOTE — Telephone Encounter (Signed)
Relation to NV:VYXA Call back number: [phone]   Reason for call:  Pt requesting a refill oxyCODONE-acetaminophen (PERCOCET) 7.5-325 MG per tablet

## 2014-11-14 NOTE — Telephone Encounter (Signed)
Refill x1 

## 2014-11-14 NOTE — Telephone Encounter (Signed)
Patient aware Rx will be ready for pick up.      KP

## 2014-11-14 NOTE — Telephone Encounter (Signed)
Last seen 09/11/14 and filled 10/10/14 #120 UDS 03/07/14 low risk (patient is due for routine labs)   Please advise     KP

## 2014-11-16 ENCOUNTER — Telehealth: Payer: Self-pay | Admitting: Family Medicine

## 2014-11-16 DIAGNOSIS — M25551 Pain in right hip: Secondary | ICD-10-CM | POA: Diagnosis not present

## 2014-11-16 NOTE — Telephone Encounter (Signed)
Caller name: Jolane Relation to pt: self Call back number: [phone] [facility] [address]  Reason for call:   Requesting escitalopram (LEXAPRO) and voltaren gel(she is requesting 4 tubes) refills

## 2014-11-16 NOTE — Telephone Encounter (Signed)
The patient has refills for both med's at her pharmacy, she needs to call. I have tried to call the patient to make her aware but the line just rang. No ans.    KP

## 2014-11-17 ENCOUNTER — Telehealth: Payer: Self-pay | Admitting: Family Medicine

## 2014-11-17 NOTE — Telephone Encounter (Signed)
Patient aware to call the pharmacy for her refills, since she had some avail.      KP

## 2014-11-17 NOTE — Telephone Encounter (Signed)
Caller name:Beena Relationship to patient:self Can be reached:[phone] [facility] [address]   Reason for call: I has been having trouble with time Suz[employer] Slick on her block.  Time [employer]an Slick went next door to try to fix the cable and phone.  She has had no service.  Please call her  It is back now.

## 2014-11-24 ENCOUNTER — Encounter: Payer: Self-pay | Admitting: Family Medicine

## 2014-11-24 ENCOUNTER — Ambulatory Visit (INDEPENDENT_AMBULATORY_CARE_PROVIDER_SITE_OTHER): Payer: Medicare Other | Admitting: Family Medicine

## 2014-11-24 VITALS — BP 154/90 | HR 91 | Temp 99.4°F | Wt 141.0 lb

## 2014-11-24 DIAGNOSIS — R591 Generalized enlarged lymph nodes: Secondary | ICD-10-CM | POA: Diagnosis not present

## 2014-11-24 DIAGNOSIS — R599 Enlarged lymph nodes, unspecified: Secondary | ICD-10-CM | POA: Diagnosis not present

## 2014-11-24 LAB — CBC WITH DIFFERENTIAL/PLATELET
BASOS ABS: 0 10*3/uL (ref 0.0–0.1)
Basophils Relative: 0 % (ref 0–1)
Eosinophils Absolute: 0.1 10*3/uL (ref 0.0–0.7)
Eosinophils Relative: 1 % (ref 0–5)
HCT: 34.1 % — ABNORMAL LOW (ref 36.0–46.0)
HEMOGLOBIN: 11 g/dL — AB (ref 12.0–15.0)
LYMPHS ABS: 2.1 10*3/uL (ref 0.7–4.0)
LYMPHS PCT: 18 % (ref 12–46)
MCH: 32.1 pg (ref 26.0–34.0)
MCHC: 32.3 g/dL (ref 30.0–36.0)
MCV: 99.4 fL (ref 78.0–100.0)
MONO ABS: 1.5 10*3/uL — AB (ref 0.1–1.0)
MPV: 9.5 fL (ref 8.6–12.4)
Monocytes Relative: 13 % — ABNORMAL HIGH (ref 3–12)
NEUTROS PCT: 68 % (ref 43–77)
Neutro Abs: 8.1 10*3/uL — ABNORMAL HIGH (ref 1.7–7.7)
Platelets: 458 10*3/uL — ABNORMAL HIGH (ref 150–400)
RBC: 3.43 MIL/uL — ABNORMAL LOW (ref 3.87–5.11)
RDW: 14.8 % (ref 11.5–15.5)
WBC: 11.9 10*3/uL — ABNORMAL HIGH (ref 4.0–10.5)

## 2014-11-24 NOTE — Patient Instructions (Signed)

## 2014-11-24 NOTE — Progress Notes (Signed)
Subjective:    Patient ID: Brandi [name], female    DOB: 01/07/1946, 69 y.o.   MRN: [id number]  HPI  Patient here c/o oozing from belly button--- but it has since resolved.  She is also c/o enlarged lymph node with sore throat but it has improved some-- pt needs labs -- she never came back for cbc repeat.  No other symptoms    Past Medical History  Diagnosis Date  . Osteoarthritis   . Chronic back pain   . History of gastric ulcer   . Constipation   . Anesthesia complication     years ago, but no problems with several surgeries since  . Shoulder pain   . Hypertension     Review of Systems  Constitutional: Negative for diaphoresis, appetite change, fatigue and unexpected weight change.  Eyes: Negative for pain, redness and visual disturbance.  Respiratory: Negative for cough, chest tightness, shortness of breath and wheezing.   Cardiovascular: Negative for chest pain, palpitations and leg swelling.  Endocrine: Negative for cold intolerance, heat intolerance, polydipsia, polyphagia and polyuria.  Genitourinary: Negative for dysuria, frequency and difficulty urinating.  Neurological: Negative for dizziness, light-headedness, numbness and headaches.  Hematological: Positive for adenopathy.    Current Outpatient Prescriptions on File Prior to Visit  Medication Sig Dispense Refill  . diazepam (VALIUM) 10 MG tablet Take 1 tablet (10 mg total) by mouth every 12 (twelve) hours as needed for anxiety. 30 tablet 0  . diclofenac sodium (VOLTAREN) 1 % GEL Apply 2 g topically 4 (four) times daily. 100 g 1  . escitalopram (LEXAPRO) 20 MG tablet Take 1 tablet (20 mg total) by mouth daily. 30 tablet 5  . estrogen, conjugated,-medroxyprogesterone (PREMPRO) 0.3-1.5 MG per tablet Take 1 tablet by mouth daily. 30 tablet 5  . metoprolol succinate (TOPROL-XL) 50 MG 24 hr tablet Take 2 tablets (100 mg total) by mouth daily. Take with or immediately following a meal. 30 tablet 2  .  oxyCODONE-acetaminophen (PERCOCET) 7.5-325 MG per tablet Take 1 tablet by mouth every 4 (four) hours as needed. 120 tablet 0  . pantoprazole (PROTONIX) 40 MG tablet Take 1 tablet (40 mg total) by mouth daily. 30 tablet 3  . ranitidine (ZANTAC) 150 MG tablet Take 1 tablet (150 mg total) by mouth 2 (two) times daily. 180 tablet 3  . temazepam (RESTORIL) 30 MG capsule 1 po qhs prn sleep 30 capsule 0  . traZODone (DESYREL) 50 MG tablet 1 po qhs prn 30 tablet 0   No current facility-administered medications on file prior to visit.       Objective:    Physical Exam  Constitutional: She is oriented to person, place, and time. She appears well-developed and well-nourished.  HENT:  Head: Normocephalic and atraumatic.  Eyes: Conjunctivae and EOM are normal.  Neck: Normal range of motion. Neck supple. No JVD present. Carotid bruit is not present. No thyromegaly present.    Cardiovascular: Normal rate, regular rhythm and normal heart sounds.   No murmur heard. Pulmonary/Chest: Effort normal and breath sounds normal. No respiratory distress. She has no wheezes. She has no rales. She exhibits no tenderness.  Musculoskeletal: She exhibits no edema.  Lymphadenopathy:    She has cervical adenopathy.  Neurological: She is alert and oriented to person, place, and time.  Psychiatric: She has a normal mood and affect. Her behavior is normal.    BP 154/90 mmHg  Pulse 91  Temp(Src) 99.4 F (37.4 C) (Oral)  Wt 141 lb (63.957  kg)  SpO2 99% Wt Readings from Last 3 Encounters:  11/24/14 141 lb (63.957 kg)  09/11/14 133 lb 3.2 oz (60.419 kg)  09/01/14 143 lb 3.2 oz (64.955 kg)     Lab Results  Component Value Date   WBC 15.1* 06/29/2014   HGB 13.6 06/29/2014   HCT 41.7 06/29/2014   PLT 436.0* 06/29/2014   GLUCOSE 98 06/29/2014   ALT 11 06/29/2014   AST 17 06/29/2014   NA 138 06/29/2014   K 3.2* 06/29/2014   CL 99 06/29/2014   CREATININE 0.96 06/29/2014   BUN 10 06/29/2014   CO2 30  06/29/2014   TSH 0.625 06/14/2013   INR 0.93 06/16/2014       Assessment & Plan:   Problem List Items Addressed This Visit    Lymphadenopathy - Primary    Recheck labs Pt has pending app with ENT If wbc still high we will refer to hematology      Relevant Orders   CBC w/Diff      I have discontinued Ms. Schmiesing's lubiprostone, predniSONE, and amoxicillin-clavulanate. I am also having her maintain her ranitidine, metoprolol succinate, pantoprazole, escitalopram, estrogen (conjugated)-medroxyprogesterone, diclofenac sodium, traZODone, temazepam, diazepam, and oxyCODONE-acetaminophen.  No orders of the defined types were placed in this encounter.     Garnet Koyanagi, DO

## 2014-11-24 NOTE — Assessment & Plan Note (Signed)
Recheck labs Pt has pending app with ENT If wbc still high we will refer to hematology

## 2014-11-24 NOTE — Progress Notes (Signed)
Pre visit review using our clinic review tool, if applicable. No additional management support is needed unless otherwise documented below in the visit note. 

## 2014-11-29 ENCOUNTER — Other Ambulatory Visit: Payer: Self-pay | Admitting: Family Medicine

## 2014-11-29 DIAGNOSIS — D72829 Elevated white blood cell count, unspecified: Secondary | ICD-10-CM

## 2014-12-06 ENCOUNTER — Telehealth: Payer: Self-pay | Admitting: Family Medicine

## 2014-12-06 NOTE — Telephone Encounter (Signed)
Relation to pt: self Call back number:[phone] Pharmacy: [facility] [place], [place] [phone] (Phone) [phone] (Fax)         Reason for call:  [place] [facility]: Kerin Salen MD prescribed oxyCODONE-acetaminophen (PERCOCET) due to hip replacement. [facility] is not letting pt  fill RX and advised pt to contact PCP. pt states she is in pain due to surgery, please advise

## 2014-12-07 NOTE — Telephone Encounter (Signed)
She needs to discuss with surgeon

## 2014-12-07 NOTE — Telephone Encounter (Signed)
Spoke with patient and she said [facility] has advised her call [facility]. The patient doesn't have the prescription she is just getting prepared for after the surgery in September.     KP

## 2014-12-07 NOTE — Telephone Encounter (Signed)
To MD to advise.      KP 

## 2014-12-11 ENCOUNTER — Other Ambulatory Visit: Payer: Self-pay

## 2014-12-20 ENCOUNTER — Telehealth: Payer: Self-pay | Admitting: Family Medicine

## 2014-12-20 DIAGNOSIS — J343 Hypertrophy of nasal turbinates: Secondary | ICD-10-CM | POA: Diagnosis not present

## 2014-12-20 DIAGNOSIS — R0981 Nasal congestion: Secondary | ICD-10-CM | POA: Diagnosis not present

## 2014-12-20 DIAGNOSIS — J3089 Other allergic rhinitis: Secondary | ICD-10-CM | POA: Diagnosis not present

## 2014-12-20 DIAGNOSIS — G4700 Insomnia, unspecified: Secondary | ICD-10-CM

## 2014-12-20 DIAGNOSIS — J3489 Other specified disorders of nose and nasal sinuses: Secondary | ICD-10-CM | POA: Diagnosis not present

## 2014-12-20 DIAGNOSIS — K140 Glossitis: Secondary | ICD-10-CM | POA: Diagnosis not present

## 2014-12-20 DIAGNOSIS — L539 Erythematous condition, unspecified: Secondary | ICD-10-CM | POA: Diagnosis not present

## 2014-12-20 DIAGNOSIS — G8929 Other chronic pain: Secondary | ICD-10-CM

## 2014-12-20 DIAGNOSIS — J300 Vasomotor rhinitis: Secondary | ICD-10-CM | POA: Diagnosis not present

## 2014-12-20 DIAGNOSIS — J309 Allergic rhinitis, unspecified: Secondary | ICD-10-CM | POA: Diagnosis not present

## 2014-12-20 DIAGNOSIS — R490 Dysphonia: Secondary | ICD-10-CM | POA: Diagnosis not present

## 2014-12-20 DIAGNOSIS — R0982 Postnasal drip: Secondary | ICD-10-CM | POA: Diagnosis not present

## 2014-12-20 DIAGNOSIS — J301 Allergic rhinitis due to pollen: Secondary | ICD-10-CM | POA: Diagnosis not present

## 2014-12-20 NOTE — Telephone Encounter (Signed)
Caller name: Mirren Relation to pt: Call back number: [phone] Pharmacy:  Reason for call:   Patient is requesting percocet and restoril refill

## 2014-12-21 ENCOUNTER — Other Ambulatory Visit: Payer: Self-pay | Admitting: Family Medicine

## 2014-12-21 MED ORDER — OXYCODONE-ACETAMINOPHEN 7.5-325 MG PO TABS: 1.0000 | ORAL_TABLET | ORAL | Status: DC | PRN

## 2014-12-21 MED ORDER — TEMAZEPAM 30 MG PO CAPS: ORAL_CAPSULE | ORAL | Status: DC

## 2014-12-21 NOTE — Telephone Encounter (Signed)
Patient aware med's are ready for pick up.      KP

## 2014-12-21 NOTE — Telephone Encounter (Signed)
Pt called to see if RXs were ready for pick up. Advised pt that she will be contacted when ready.

## 2014-12-21 NOTE — Telephone Encounter (Signed)
Last seen 11/24/14  Oxycodone 11/13/13 #120 Temazepam 11/08/14 #30  UDS 03/07/14 low risk   Please advise       KP

## 2014-12-21 NOTE — Telephone Encounter (Signed)
Ok to refill x 1  

## 2014-12-26 ENCOUNTER — Ambulatory Visit (INDEPENDENT_AMBULATORY_CARE_PROVIDER_SITE_OTHER): Payer: Medicare Other | Admitting: Family Medicine

## 2014-12-26 ENCOUNTER — Encounter: Payer: Self-pay | Admitting: Family Medicine

## 2014-12-26 VITALS — BP 148/90 | HR 98 | Temp 98.2°F | Ht 64.0 in | Wt 145.8 lb

## 2014-12-26 DIAGNOSIS — G4700 Insomnia, unspecified: Secondary | ICD-10-CM

## 2014-12-26 DIAGNOSIS — M25559 Pain in unspecified hip: Secondary | ICD-10-CM

## 2014-12-26 DIAGNOSIS — G8929 Other chronic pain: Secondary | ICD-10-CM | POA: Diagnosis not present

## 2014-12-26 DIAGNOSIS — R300 Dysuria: Secondary | ICD-10-CM | POA: Diagnosis not present

## 2014-12-26 LAB — POCT URINALYSIS DIPSTICK
BILIRUBIN UA: NEGATIVE
Blood, UA: NEGATIVE
GLUCOSE UA: NEGATIVE
KETONES UA: NEGATIVE
LEUKOCYTES UA: NEGATIVE
Nitrite, UA: NEGATIVE
Spec Grav, UA: 1.015
Urobilinogen, UA: 0.2
pH, UA: 7

## 2014-12-26 MED ORDER — TRAZODONE HCL 50 MG PO TABS: ORAL_TABLET | ORAL | Status: DC

## 2014-12-26 NOTE — Progress Notes (Signed)
Pre visit review using our clinic review tool, if applicable. No additional management support is needed unless otherwise documented below in the visit note. 

## 2014-12-26 NOTE — Progress Notes (Signed)
Patient ID: Brandi [name], female    DOB: [birth year]  Age: 69 y.o. MRN: [id number]    Subjective:  Subjective HPI Brandi [name] presents with c/o dysuria for 2 months and frequency.  She also has many questions about her upcoming hip surger with Dr Eddie Dibbles.  Review of Systems  Constitutional: Negative for diaphoresis, appetite change, fatigue and unexpected weight change.  Eyes: Negative for pain, redness and visual disturbance.  Respiratory: Negative for cough, chest tightness, shortness of breath and wheezing.   Cardiovascular: Negative for chest pain, palpitations and leg swelling.  Endocrine: Negative for cold intolerance, heat intolerance, polydipsia, polyphagia and polyuria.  Genitourinary: Negative for dysuria, frequency and difficulty urinating.  Musculoskeletal: Positive for myalgias, back pain and arthralgias.  Neurological: Negative for dizziness, light-headedness, numbness and headaches.    History Past Medical History  Diagnosis Date  . Osteoarthritis   . Chronic back pain   . History of gastric ulcer   . Constipation   . Anesthesia complication     years ago, but no problems with several surgeries since  . Shoulder pain   . Hypertension     She has past surgical history that includes Foot surgery; Lumbar fusion; Shoulder surgery; Root canal (11/01/13); and Esophagogastroduodenoscopy (N/A, 06/16/2014).   Her family history includes Cancer in her father and mother; Cancer (age of onset: 67) in her sister. There is no history of Heart disease.She reports that she has never smoked. She has never used smokeless tobacco. She reports that she does not drink alcohol or use illicit drugs.  Current Outpatient Prescriptions on File Prior to Visit  Medication Sig Dispense Refill  . diazepam (VALIUM) 10 MG tablet Take 1 tablet (10 mg total) by mouth every 12 (twelve) hours as needed for anxiety. 30 tablet 0  . diclofenac sodium (VOLTAREN) 1 % GEL APPLY 2 G TOPICALLY 4 (FOUR) TIMES  DAILY. 100 g 1  . escitalopram (LEXAPRO) 20 MG tablet Take 1 tablet (20 mg total) by mouth daily. 30 tablet 5  . estrogen, conjugated,-medroxyprogesterone (PREMPRO) 0.3-1.5 MG per tablet Take 1 tablet by mouth daily. 30 tablet 5  . metoprolol succinate (TOPROL-XL) 50 MG 24 hr tablet Take 2 tablets (100 mg total) by mouth daily. Take with or immediately following a meal. 30 tablet 2  . oxyCODONE-acetaminophen (PERCOCET) 7.5-325 MG per tablet Take 1 tablet by mouth every 4 (four) hours as needed. 120 tablet 0  . pantoprazole (PROTONIX) 40 MG tablet Take 1 tablet (40 mg total) by mouth daily. 30 tablet 3  . ranitidine (ZANTAC) 150 MG tablet Take 1 tablet (150 mg total) by mouth 2 (two) times daily. 180 tablet 3  . temazepam (RESTORIL) 30 MG capsule 1 po qhs prn sleep 30 capsule 0   No current facility-administered medications on file prior to visit.     Objective:  Objective Physical Exam  Constitutional: She is oriented to person, place, and time. She appears well-developed and well-nourished.  HENT:  Head: Normocephalic and atraumatic.  Eyes: Conjunctivae and EOM are normal.  Neck: Normal range of motion. Neck supple. No JVD present. Carotid bruit is not present. No thyromegaly present.  Cardiovascular: Normal rate, regular rhythm and normal heart sounds.   No murmur heard. Pulmonary/Chest: Effort normal and breath sounds normal. No respiratory distress. She has no wheezes. She has no rales. She exhibits no tenderness.  Musculoskeletal: She exhibits tenderness. She exhibits no edema.       Left hip: She exhibits decreased range of motion  and tenderness.  Neurological: She is alert and oriented to person, place, and time.  Psychiatric: She has a normal mood and affect. Her behavior is normal.   BP 148/90 mmHg  Pulse 98  Temp(Src) 98.2 F (36.8 C) (Oral)  Ht 5\' 4"  (1.626 m)  Wt 145 lb 12.8 oz (66.134 kg)  BMI 25.01 kg/m2  SpO2 98% Wt Readings from Last 3 Encounters:  12/26/14 145 lb  12.8 oz (66.134 kg)  11/24/14 141 lb (63.957 kg)  09/11/14 133 lb 3.2 oz (60.419 kg)     Lab Results  Component Value Date   WBC 11.9* 11/24/2014   HGB 11.0* 11/24/2014   HCT 34.1* 11/24/2014   PLT 458* 11/24/2014   GLUCOSE 98 06/29/2014   ALT 11 06/29/2014   AST 17 06/29/2014   NA 138 06/29/2014   K 3.2* 06/29/2014   CL 99 06/29/2014   CREATININE 0.96 06/29/2014   BUN 10 06/29/2014   CO2 30 06/29/2014   TSH 0.625 06/14/2013   INR 0.93 06/16/2014    Ct Abdomen Pelvis W Contrast  06/15/2014   CLINICAL DATA:  Upper abdominal pain and rectal bleeding for the past week. Nausea, vomiting and diarrhea.  EXAM: CT ABDOMEN AND PELVIS WITH CONTRAST  TECHNIQUE: Multidetector CT imaging of the abdomen and pelvis was performed using the standard protocol following bolus administration of intravenous contrast.  CONTRAST:  74mL OMNIPAQUE IOHEXOL 300 MG/ML SOLN, 9mL OMNIPAQUE IOHEXOL 300 MG/ML SOLN  COMPARISON:  None.  FINDINGS: Mild diffuse low density of the liver relative to the spleen. Small liver cysts. Right renal cyst. Normal appearing spleen, pancreas, gallbladder, adrenal glands, left kidney and urinary bladder. Exophytic uterine fibroid anteriorly on the left, measuring 3.7 cm in maximum diameter. Grossly normal appearing ovaries. Mildly prominent stool in the colon. Normal appearing appendix. No gastric or small bowel abnormalities. Marked bilateral hip degenerative changes, greater on the left. L4-5 Ray cages. Congenital L5-S1 fusion. Small amount of tubular and patchy density in the right middle lobe on the first images. No definite lung nodule.  IMPRESSION: 1. Mildly prominent stool. 2. Mild hepatic steatosis. 3. Exophytic uterine fibroid. 4. No acute abnormality   Electronically Signed   By: Enrique Sack M.D.   On: 06/15/2014 16:21   US Renal  06/16/2014   CLINICAL DATA:  Acute kidney injury, hypertension  EXAM: RENAL/URINARY TRACT ULTRASOUND COMPLETE  COMPARISON:  CT 06/15/2014   FINDINGS: Right Kidney:  Length: 10.2 cm. Echogenicity within normal limits. 20 x 18 x 19 mm midpole cyst. No hydronephrosis.  Left Kidney:  Length: 10.3 cm. Echogenicity within normal limits. No mass or hydronephrosis visualized.  Bladder:  Appears normal for degree of bladder distention.  IMPRESSION: 1. Negative for hydronephrosis. 2. 2 cm right renal cyst.   Electronically Signed   By: Arne Cleveland M.D.   On: 06/16/2014 09:07     Assessment & Plan:  Plan I am having Ms. Tortora maintain her ranitidine, metoprolol succinate, pantoprazole, escitalopram, estrogen (conjugated)-medroxyprogesterone, diazepam, diclofenac sodium, temazepam, oxyCODONE-acetaminophen, and traZODone.  Meds ordered this encounter  Medications  . traZODone (DESYREL) 50 MG tablet    Sig: 1 po qhs prn    Dispense:  30 tablet    Refill:  0    Problem List Items Addressed This Visit    Hip pain, chronic    Per ortho Ortho will provide pain med while under their care      Relevant Medications   traZODone (DESYREL) 50 MG tablet  Other Visit Diagnoses    Dysuria    -  Primary    Relevant Orders    Ambulatory referral to Urology    POCT urinalysis dipstick (Completed)    Urine Culture    Insomnia        Relevant Medications    traZODone (DESYREL) 50 MG tablet       Follow-up: Return if symptoms worsen or fail to improve.  Garnet Koyanagi, DO

## 2014-12-26 NOTE — Assessment & Plan Note (Signed)
Per ortho Ortho will provide pain med while under their care

## 2014-12-26 NOTE — Patient Instructions (Signed)

## 2014-12-28 LAB — URINE CULTURE: Colony Count: 10000

## 2015-01-02 ENCOUNTER — Telehealth: Payer: Self-pay | Admitting: Family Medicine

## 2015-01-02 NOTE — Telephone Encounter (Signed)
Relation to pt: self Call back [place]: [id number] [place], [place] [phone] ([phone] (Fax)         Reason for call:  Patient experiencing flu like symptoms, congestion, ears clogged. Patient only wants to see PCP, first available is not until Thursday. Patient requesting a rx or OTC in need of clinical advice

## 2015-01-02 NOTE — Telephone Encounter (Signed)
Informed patient of the provider's recommendations below. Patient understood and did not have any further questions or concerns. Future appointment has been scheduled for 01/04/15.

## 2015-01-02 NOTE — Telephone Encounter (Signed)
Use claritin, zyrtec or allegra otc ,  Also get rhinocort or nasacort or flonase otc Take tylenol for pain and headache--- if no relief or worsens I would advise being seen sooner

## 2015-01-02 NOTE — Telephone Encounter (Signed)
Spoke with patient regarding the below note. Patient reported that her bones, eyes, and head hurts and that its hard to walk as well.  Please advise. RB

## 2015-01-04 ENCOUNTER — Ambulatory Visit: Payer: Medicare Other | Admitting: Family Medicine

## 2015-01-04 DIAGNOSIS — Z0289 Encounter for other administrative examinations: Secondary | ICD-10-CM

## 2015-01-05 ENCOUNTER — Ambulatory Visit (INDEPENDENT_AMBULATORY_CARE_PROVIDER_SITE_OTHER): Payer: Medicare Other | Admitting: Family Medicine

## 2015-01-05 ENCOUNTER — Encounter: Payer: Self-pay | Admitting: Family Medicine

## 2015-01-05 VITALS — BP 134/76 | HR 100 | Temp 98.7°F | Wt 149.6 lb

## 2015-01-05 DIAGNOSIS — I10 Essential (primary) hypertension: Secondary | ICD-10-CM

## 2015-01-05 DIAGNOSIS — F419 Anxiety disorder, unspecified: Secondary | ICD-10-CM

## 2015-01-05 MED ORDER — DIAZEPAM 10 MG PO TABS: 10.0000 mg | ORAL_TABLET | Freq: Two times a day (BID) | ORAL | Status: DC | PRN

## 2015-01-05 MED ORDER — METOPROLOL SUCCINATE ER 50 MG PO TB24: 100.0000 mg | ORAL_TABLET | Freq: Every day | ORAL | Status: DC

## 2015-01-05 NOTE — Progress Notes (Signed)
Pre visit review using our clinic review tool, if applicable. No additional management support is needed unless otherwise documented below in the visit note. 

## 2015-01-05 NOTE — Patient Instructions (Signed)
Generalized Anxiety Disorder Generalized anxiety disorder (GAD) is a mental disorder. It interferes with life functions, including relationships, work, and school. GAD is different from normal anxiety, which everyone experiences at some point in their lives in response to specific life events and activities. Normal anxiety actually helps us prepare for and get through these life events and activities. Normal anxiety goes away after the event or activity is over.  GAD causes anxiety that is not necessarily related to specific events or activities. It also causes excess anxiety in proportion to specific events or activities. The anxiety associated with GAD is also difficult to control. GAD can vary from mild to severe. People with severe GAD can have intense waves of anxiety with physical symptoms (panic attacks).  SYMPTOMS The anxiety and worry associated with GAD are difficult to control. This anxiety and worry are related to many life events and activities and also occur more days than not for 6 months or longer. People with GAD also have three or more of the following symptoms (one or more in children):  Restlessness.   Fatigue.  Difficulty concentrating.   Irritability.  Muscle tension.  Difficulty sleeping or unsatisfying sleep. DIAGNOSIS GAD is diagnosed through an assessment by your health care provider. Your health care provider will ask you questions aboutyour mood,physical symptoms, and events in your life. Your health care provider may ask you about your medical history and use of alcohol or drugs, including prescription medicines. Your health care provider may also do a physical exam and blood tests. Certain medical conditions and the use of certain substances can cause symptoms similar to those associated with GAD. Your health care provider may refer you to a mental health specialist for further evaluation. TREATMENT The following therapies are usually used to treat GAD:    Medication. Antidepressant medication usually is prescribed for long-term daily control. Antianxiety medicines may be added in severe cases, especially when panic attacks occur.   Talk therapy (psychotherapy). Certain types of talk therapy can be helpful in treating GAD by providing support, education, and guidance. A form of talk therapy called cognitive behavioral therapy can teach you healthy ways to think about and react to daily life events and activities.  Stress managementtechniques. These include yoga, meditation, and exercise and can be very helpful when they are practiced regularly. A mental health specialist can help determine which treatment is best for you. Some people see improvement with one therapy. However, other people require a combination of therapies. Document Released: 09/27/2012 Document Revised: 10/17/2013 Document Reviewed: 09/27/2012 ExitCare Patient Information 2015 [employer]. This information is not intended to replace advice given to you by your health care provider. Make sure you discuss any questions you have with your health care provider.  

## 2015-01-05 NOTE — Progress Notes (Signed)
+  Subjective:    Patient ID: Brandi [name], female    DOB: Oct 16, 1945, 69 y.o.   MRN: [id number]  HPI  Patient here for f/u bp and refills on valium.  No new complaints.    Past Medical History  Diagnosis Date  . Osteoarthritis   . Chronic back pain   . History of gastric ulcer   . Constipation   . Anesthesia complication     years ago, but no problems with several surgeries since  . Shoulder pain   . Hypertension     Review of Systems  Constitutional: Negative for diaphoresis, appetite change, fatigue and unexpected weight change.  Eyes: Negative for pain, redness and visual disturbance.  Respiratory: Negative for cough, chest tightness, shortness of breath and wheezing.   Cardiovascular: Negative for chest pain, palpitations and leg swelling.  Endocrine: Negative for cold intolerance, heat intolerance, polydipsia, polyphagia and polyuria.  Genitourinary: Negative for dysuria, frequency and difficulty urinating.  Neurological: Negative for dizziness, light-headedness, numbness and headaches.    Current Outpatient Prescriptions on File Prior to Visit  Medication Sig Dispense Refill  . diclofenac sodium (VOLTAREN) 1 % GEL APPLY 2 G TOPICALLY 4 (FOUR) TIMES DAILY. 100 g 1  . escitalopram (LEXAPRO) 20 MG tablet Take 1 tablet (20 mg total) by mouth daily. 30 tablet 5  . estrogen, conjugated,-medroxyprogesterone (PREMPRO) 0.3-1.5 MG per tablet Take 1 tablet by mouth daily. 30 tablet 5  . oxyCODONE-acetaminophen (PERCOCET) 7.5-325 MG per tablet Take 1 tablet by mouth every 4 (four) hours as needed. 120 tablet 0  . pantoprazole (PROTONIX) 40 MG tablet Take 1 tablet (40 mg total) by mouth daily. 30 tablet 3  . ranitidine (ZANTAC) 150 MG tablet Take 1 tablet (150 mg total) by mouth 2 (two) times daily. 180 tablet 3  . temazepam (RESTORIL) 30 MG capsule 1 po qhs prn sleep 30 capsule 0  . traZODone (DESYREL) 50 MG tablet 1 po qhs prn 30 tablet 0   No current facility-administered  medications on file prior to visit.       Objective:    Physical Exam  Constitutional: She is oriented to person, place, and time. She appears well-developed and well-nourished.  HENT:  Head: Normocephalic and atraumatic.  Eyes: Conjunctivae and EOM are normal.  Neck: Normal range of motion. Neck supple. No JVD present. Carotid bruit is not present. No thyromegaly present.  Cardiovascular: Normal rate, regular rhythm and normal heart sounds.   No murmur heard. Pulmonary/Chest: Effort normal and breath sounds normal. No respiratory distress. She has no wheezes. She has no rales. She exhibits no tenderness.  Musculoskeletal: She exhibits no edema.  Neurological: She is alert and oriented to person, place, and time.  Psychiatric: She has a normal mood and affect. Her behavior is normal.    BP 134/76 mmHg  Pulse 100  Temp(Src) 98.7 F (37.1 C) (Oral)  Wt 149 lb 9.6 oz (67.858 kg)  SpO2 98% Wt Readings from Last 3 Encounters:  01/05/15 149 lb 9.6 oz (67.858 kg)  12/26/14 145 lb 12.8 oz (66.134 kg)  11/24/14 141 lb (63.957 kg)     Lab Results  Component Value Date   WBC 11.9* 11/24/2014   HGB 11.0* 11/24/2014   HCT 34.1* 11/24/2014   PLT 458* 11/24/2014   GLUCOSE 98 06/29/2014   ALT 11 06/29/2014   AST 17 06/29/2014   NA 138 06/29/2014   K 3.2* 06/29/2014   CL 99 06/29/2014   CREATININE 0.96 06/29/2014  BUN 10 06/29/2014   CO2 30 06/29/2014   TSH 0.625 06/14/2013   INR 0.93 06/16/2014       Assessment & Plan:   Problem List Items Addressed This Visit    Essential hypertension, benign    Stable       Relevant Medications   metoprolol succinate (TOPROL-XL) 50 MG 24 hr tablet    Other Visit Diagnoses    Anxiety disorder, unspecified anxiety disorder type    -  Primary    Relevant Medications    diazepam (VALIUM) 10 MG tablet    Essential hypertension        Relevant Medications    metoprolol succinate (TOPROL-XL) 50 MG 24 hr tablet       I am having  [name] maintain her ranitidine, pantoprazole, escitalopram, estrogen (conjugated)-medroxyprogesterone, diclofenac sodium, temazepam, oxyCODONE-acetaminophen, traZODone, metoprolol succinate, and diazepam.  Meds ordered this encounter  Medications  . DISCONTD: diazepam (VALIUM) 10 MG tablet    Sig: Take 1 tablet (10 mg total) by mouth every 12 (twelve) hours as needed for anxiety.    Dispense:  30 tablet    Refill:  0  . metoprolol succinate (TOPROL-XL) 50 MG 24 hr tablet    Sig: Take 2 tablets (100 mg total) by mouth daily. Take with or immediately following a meal.    Dispense:  180 tablet    Refill:  1  . diazepam (VALIUM) 10 MG tablet    Sig: Take 1 tablet (10 mg total) by mouth every 12 (twelve) hours as needed for anxiety.    Dispense:  45 tablet    Refill:  Dunn Loring, DO

## 2015-01-06 NOTE — Assessment & Plan Note (Signed)
Stable

## 2015-01-11 ENCOUNTER — Other Ambulatory Visit: Payer: Self-pay | Admitting: Orthopedic Surgery

## 2015-01-15 ENCOUNTER — Telehealth: Payer: Self-pay | Admitting: Family Medicine

## 2015-01-15 DIAGNOSIS — G4700 Insomnia, unspecified: Secondary | ICD-10-CM

## 2015-01-15 MED ORDER — TRAZODONE HCL 50 MG PO TABS: ORAL_TABLET | ORAL | Status: DC

## 2015-01-15 NOTE — Telephone Encounter (Signed)
Last seen 01/05/15 and filled 12/26/14 # 30  Please advise    KP

## 2015-01-15 NOTE — Telephone Encounter (Signed)
Ok to give postdated rx

## 2015-01-15 NOTE — Telephone Encounter (Signed)
Caller name:Blasing Laurice Relation to AO:ZHYQ Call back [phone] [facility]  Reason for call: pt is needing rxtraZODone (DESYREL) 50 MG tablet, pt would like to know if she can get a refill on it because she is going to [place] and does not know when she will be returning.

## 2015-01-15 NOTE — Telephone Encounter (Signed)
noted 

## 2015-01-15 NOTE — Telephone Encounter (Signed)
Rx printed and the patient is aware it is ready for pick up. She said she was going to see Dr.Poole and he will do a look over since she is having so many issues as you guys discussed.     KP

## 2015-01-22 ENCOUNTER — Telehealth: Payer: Self-pay | Admitting: Family Medicine

## 2015-01-22 DIAGNOSIS — G8929 Other chronic pain: Secondary | ICD-10-CM

## 2015-01-22 NOTE — Telephone Encounter (Signed)
Refill each x1

## 2015-01-22 NOTE — Telephone Encounter (Signed)
Caller name: Kristee Relation to pt: self Call back number: [phone] Pharmacy:  Reason for call: Pt is requesting for meds for oxyCODONE-acetaminophen (PERCOCET) 7.5-325 MG per tablet. Please advise.

## 2015-01-22 NOTE — Telephone Encounter (Signed)
Last seen 01/05/15 and filled 12/21/14 #120 UDS 03/07/14 Low risk   Please advise     KP

## 2015-01-23 DIAGNOSIS — M545 Low back pain: Secondary | ICD-10-CM | POA: Diagnosis not present

## 2015-01-23 DIAGNOSIS — R0781 Pleurodynia: Secondary | ICD-10-CM | POA: Diagnosis not present

## 2015-01-23 DIAGNOSIS — M2012 Hallux valgus (acquired), left foot: Secondary | ICD-10-CM | POA: Diagnosis not present

## 2015-01-23 DIAGNOSIS — M79671 Pain in right foot: Secondary | ICD-10-CM | POA: Diagnosis not present

## 2015-01-23 MED ORDER — OXYCODONE-ACETAMINOPHEN 7.5-325 MG PO TABS: 1.0000 | ORAL_TABLET | ORAL | Status: DC | PRN

## 2015-01-23 NOTE — Telephone Encounter (Signed)
Rx left at check in.      KP

## 2015-01-29 ENCOUNTER — Telehealth: Payer: Self-pay | Admitting: Family Medicine

## 2015-01-29 NOTE — Telephone Encounter (Signed)
Patient will keep her apt for tomorrow.     KP

## 2015-01-29 NOTE — Telephone Encounter (Signed)
Relation to pt: self  Call back [phone]   Reason for call:  patient states WBC was elevated at one point and now they are leveled, patient scheduled an appointment with MD for 01/30/15 at 5:15pm. Patient would like to know will labs be taken by Maudie Mercury or does she need a lab appointment only,. Please advise

## 2015-01-30 ENCOUNTER — Encounter: Payer: Self-pay | Admitting: Family Medicine

## 2015-01-30 ENCOUNTER — Ambulatory Visit (INDEPENDENT_AMBULATORY_CARE_PROVIDER_SITE_OTHER): Payer: Medicare Other | Admitting: Family Medicine

## 2015-01-30 VITALS — BP 160/92 | HR 93 | Temp 99.6°F | Wt 151.4 lb

## 2015-01-30 DIAGNOSIS — G4700 Insomnia, unspecified: Secondary | ICD-10-CM

## 2015-01-30 DIAGNOSIS — D72829 Elevated white blood cell count, unspecified: Secondary | ICD-10-CM | POA: Diagnosis not present

## 2015-01-30 DIAGNOSIS — J329 Chronic sinusitis, unspecified: Secondary | ICD-10-CM | POA: Insufficient documentation

## 2015-01-30 DIAGNOSIS — N951 Menopausal and female climacteric states: Secondary | ICD-10-CM

## 2015-01-30 DIAGNOSIS — R232 Flushing: Secondary | ICD-10-CM

## 2015-01-30 MED ORDER — CONJ ESTROG-MEDROXYPROGEST ACE 0.3-1.5 MG PO TABS: 1.0000 | ORAL_TABLET | Freq: Every day | ORAL | Status: DC

## 2015-01-30 NOTE — Assessment & Plan Note (Signed)
con't restoril

## 2015-01-30 NOTE — Assessment & Plan Note (Signed)
Pt has appointment to see ENT coming up

## 2015-01-30 NOTE — Patient Instructions (Signed)
Please do not take anymore decongestants--- this elevated your bp.

## 2015-01-30 NOTE — Progress Notes (Signed)
Patient ID: FEMALE Brandi [name], female    DOB: [birth year]  Age: 69 y.o. MRN: [id number]    Subjective:  Subjective HPI Brandi [name] presents for f/u labs-- specifically wbc. She also needs a refill of her hormones.  Review of Systems  Constitutional: Negative for diaphoresis, appetite change, fatigue and unexpected weight change.  Eyes: Negative for pain, redness and visual disturbance.  Respiratory: Negative for cough, chest tightness, shortness of breath and wheezing.   Cardiovascular: Negative for chest pain, palpitations and leg swelling.  Endocrine: Negative for cold intolerance, heat intolerance, polydipsia, polyphagia and polyuria.  Genitourinary: Negative for dysuria, frequency and difficulty urinating.  Neurological: Negative for dizziness, light-headedness, numbness and headaches.    History Past Medical History  Diagnosis Date  . Osteoarthritis   . Chronic back pain   . History of gastric ulcer   . Constipation   . Anesthesia complication     years ago, but no problems with several surgeries since  . Shoulder pain   . Hypertension     She has past surgical history that includes Foot surgery; Lumbar fusion; Shoulder surgery; Root canal (11/01/13); and Esophagogastroduodenoscopy (N/A, 06/16/2014).   Her family history includes Cancer in her father and mother; Cancer (age of onset: 43) in her sister. There is no history of Heart disease.She reports that she has never smoked. She has never used smokeless tobacco. She reports that she does not drink alcohol or use illicit drugs.  Current Outpatient Prescriptions on File Prior to Visit  Medication Sig Dispense Refill  . diazepam (VALIUM) 10 MG tablet Take 1 tablet (10 mg total) by mouth every 12 (twelve) hours as needed for anxiety. 45 tablet 2  . diclofenac sodium (VOLTAREN) 1 % GEL APPLY 2 G TOPICALLY 4 (FOUR) TIMES DAILY. 100 g 1  . escitalopram (LEXAPRO) 20 MG tablet Take 1 tablet (20 mg total) by mouth daily. 30 tablet 5  .  metoprolol succinate (TOPROL-XL) 50 MG 24 hr tablet Take 2 tablets (100 mg total) by mouth daily. Take with or immediately following a meal. 180 tablet 1  . oxyCODONE-acetaminophen (PERCOCET) 7.5-325 MG per tablet Take 1 tablet by mouth every 4 (four) hours as needed. 120 tablet 0  . pantoprazole (PROTONIX) 40 MG tablet Take 1 tablet (40 mg total) by mouth daily. 30 tablet 3  . ranitidine (ZANTAC) 150 MG tablet Take 1 tablet (150 mg total) by mouth 2 (two) times daily. 180 tablet 3  . temazepam (RESTORIL) 30 MG capsule 1 po qhs prn sleep 30 capsule 0  . traZODone (DESYREL) 50 MG tablet 1 po qhs prn 30 tablet 0   No current facility-administered medications on file prior to visit.     Objective:  Objective Physical Exam  Constitutional: She is oriented to person, place, and time. She appears well-developed and well-nourished.  HENT:  Head: Normocephalic and atraumatic.  Eyes: Conjunctivae and EOM are normal.  Neck: Normal range of motion. Neck supple. No JVD present. Carotid bruit is not present. No thyromegaly present.  Cardiovascular: Normal rate, regular rhythm and normal heart sounds.   No murmur heard. Pulmonary/Chest: Effort normal and breath sounds normal. No respiratory distress. She has no wheezes. She has no rales. She exhibits no tenderness.  Musculoskeletal: She exhibits no edema.  Neurological: She is alert and oriented to person, place, and time.  Psychiatric: She has a normal mood and affect. Her behavior is normal.  Nursing note and vitals reviewed.  BP 160/92 mmHg  Pulse 93  Temp(Src) 99.6 F (37.6 C) (Oral)  Wt 151 lb 6.4 oz (68.675 kg)  SpO2 98% Wt Readings from Last 3 Encounters:  01/30/15 151 lb 6.4 oz (68.675 kg)  01/05/15 149 lb 9.6 oz (67.858 kg)  12/26/14 145 lb 12.8 oz (66.134 kg)     Lab Results  Component Value Date   WBC 11.9* 11/24/2014   HGB 11.0* 11/24/2014   HCT 34.1* 11/24/2014   PLT 458* 11/24/2014   GLUCOSE 98 06/29/2014   ALT 11  06/29/2014   AST 17 06/29/2014   NA 138 06/29/2014   K 3.2* 06/29/2014   CL 99 06/29/2014   CREATININE 0.96 06/29/2014   BUN 10 06/29/2014   CO2 30 06/29/2014   TSH 0.625 06/14/2013   INR 0.93 06/16/2014    Ct Abdomen Pelvis W Contrast  06/15/2014   CLINICAL DATA:  Upper abdominal pain and rectal bleeding for the past week. Nausea, vomiting and diarrhea.  EXAM: CT ABDOMEN AND PELVIS WITH CONTRAST  TECHNIQUE: Multidetector CT imaging of the abdomen and pelvis was performed using the standard protocol following bolus administration of intravenous contrast.  CONTRAST:  16mL OMNIPAQUE IOHEXOL 300 MG/ML SOLN, 28mL OMNIPAQUE IOHEXOL 300 MG/ML SOLN  COMPARISON:  None.  FINDINGS: Mild diffuse low density of the liver relative to the spleen. Small liver cysts. Right renal cyst. Normal appearing spleen, pancreas, gallbladder, adrenal glands, left kidney and urinary bladder. Exophytic uterine fibroid anteriorly on the left, measuring 3.7 cm in maximum diameter. Grossly normal appearing ovaries. Mildly prominent stool in the colon. Normal appearing appendix. No gastric or small bowel abnormalities. Marked bilateral hip degenerative changes, greater on the left. L4-5 Ray cages. Congenital L5-S1 fusion. Small amount of tubular and patchy density in the right middle lobe on the first images. No definite lung nodule.  IMPRESSION: 1. Mildly prominent stool. 2. Mild hepatic steatosis. 3. Exophytic uterine fibroid. 4. No acute abnormality   Electronically Signed   By: Enrique Sack M.D.   On: 06/15/2014 16:21   US Renal  06/16/2014   CLINICAL DATA:  Acute kidney injury, hypertension  EXAM: RENAL/URINARY TRACT ULTRASOUND COMPLETE  COMPARISON:  CT 06/15/2014  FINDINGS: Right Kidney:  Length: 10.2 cm. Echogenicity within normal limits. 20 x 18 x 19 mm midpole cyst. No hydronephrosis.  Left Kidney:  Length: 10.3 cm. Echogenicity within normal limits. No mass or hydronephrosis visualized.  Bladder:  Appears normal for degree  of bladder distention.  IMPRESSION: 1. Negative for hydronephrosis. 2. 2 cm right renal cyst.   Electronically Signed   By: Arne Cleveland M.D.   On: 06/16/2014 09:07     Assessment & Plan:  Plan I am having Ms. Bothwell maintain her ranitidine, pantoprazole, escitalopram, diclofenac sodium, temazepam, metoprolol succinate, diazepam, traZODone, oxyCODONE-acetaminophen, and estrogen (conjugated)-medroxyprogesterone.  Meds ordered this encounter  Medications  . estrogen, conjugated,-medroxyprogesterone (PREMPRO) 0.3-1.5 MG per tablet    Sig: Take 1 tablet by mouth daily.    Dispense:  30 tablet    Refill:  5    Problem List Items Addressed This Visit    Sinusitis, chronic    Pt has appointment to see ENT coming up        Other Visit Diagnoses    Hot flashes    -  Primary    Relevant Medications    estrogen, conjugated,-medroxyprogesterone (PREMPRO) 0.3-1.5 MG per tablet    Insomnia        Elevated white blood cell count        Relevant  Orders    CBC with Differential/Platelet       Follow-up: Return in about 3 months (around 05/02/2015), or if symptoms worsen or fail to improve, for labs-- recheck cbcd.  Garnet Koyanagi, DO

## 2015-02-05 ENCOUNTER — Telehealth: Payer: Self-pay | Admitting: Family Medicine

## 2015-02-05 MED ORDER — DICLOFENAC SODIUM 1 % TD GEL: TRANSDERMAL | Status: DC

## 2015-02-05 NOTE — Telephone Encounter (Signed)
Caller name: Brandi [name] Relationship to patient: Self  Can be [phone] Pharmacy:  Reason for call: pt would like a refill on her VOLTAREN Rx. Pt is requesting an addition Rx. Please call back to confirm and advise.

## 2015-02-05 NOTE — Telephone Encounter (Signed)
Rx faxed.    KP 

## 2015-02-12 ENCOUNTER — Telehealth: Payer: Self-pay

## 2015-02-12 NOTE — Telephone Encounter (Signed)
We will not refill pain meds

## 2015-02-12 NOTE — Telephone Encounter (Signed)
Call from [place] at [facility] and she stated that the patient dropped off a script for Oxycodone that was written on 01/23/15 by Dr.Lowne, however the patient received a script on 01/24/15 for Hydrocodone 10-325 #30 by Joanell Rising, 02/03/15 Hydrocodone 5-325 #120 by Dr.Vincent Eddie Dibbles and this was written on 01/23/15 but filled 02/03/15. The Oxy from [facility] was written on 01/23/15 but filled on 02/12/15 #120 and she also received Acetaminophen with codeine #12 from her dentist yesterday. To MD to review.     KP

## 2015-02-13 ENCOUNTER — Telehealth: Payer: Self-pay | Admitting: Family Medicine

## 2015-02-13 DIAGNOSIS — G4700 Insomnia, unspecified: Secondary | ICD-10-CM

## 2015-02-13 NOTE — Telephone Encounter (Signed)
Refill x1 

## 2015-02-13 NOTE — Telephone Encounter (Signed)
noted 

## 2015-02-13 NOTE — Telephone Encounter (Signed)
I discussed the below information from the pharmacy with the patient and made her aware that she breached her contract with Dr.Lowne and we will no longer prescribe pain med's she said she was in pain and the hydrocodone did not help, I made her aware she should have notified us or Ortho, she said she was sorry,  She wanted to know what to do about the pain and I advised she that she had a script filled and she can use that until she see's ortho again. She stated she flushed them down the toilet, I made her aware that she needed to follow up with Ortho but she can use the Rx that the dentist prescribed her yesterday as well, she again apologized and said she was not aware of how this works. I advised I would make Dr. Etter Sjogren aware.     KP

## 2015-02-13 NOTE — Telephone Encounter (Signed)
Pt called in to find out why meds were voided. She wanted to hold for Kim. Advised that Maudie Mercury was with a patient. Pt held for a few minutes then states meds have her back in forth in the bathroom. I advised I would have Kim call her. Pt states she will try calling us again later.

## 2015-02-13 NOTE — Telephone Encounter (Signed)
Last seen 01/30/15 and filled 01/15/15 #30   Please advise    KP

## 2015-02-13 NOTE — Telephone Encounter (Signed)
Relation to QN:VVYX Call back number:[phone]   Reason for call:  Patient states she has a Rx for traZODone (DESYREL) 50 MG tablet and would like to know if she can fill.

## 2015-02-14 ENCOUNTER — Telehealth: Payer: Self-pay | Admitting: Family Medicine

## 2015-02-14 DIAGNOSIS — M1612 Unilateral primary osteoarthritis, left hip: Secondary | ICD-10-CM | POA: Diagnosis not present

## 2015-02-14 DIAGNOSIS — R262 Difficulty in walking, not elsewhere classified: Secondary | ICD-10-CM | POA: Diagnosis not present

## 2015-02-14 DIAGNOSIS — M25552 Pain in left hip: Secondary | ICD-10-CM | POA: Diagnosis not present

## 2015-02-14 MED ORDER — TRAZODONE HCL 50 MG PO TABS: ORAL_TABLET | ORAL | Status: DC

## 2015-02-14 NOTE — Telephone Encounter (Signed)
Pt request Voltaren topical cream for her arthritis  [facility] [address] With 1 refill if possible.  CB#: [phone]     Thanks.

## 2015-02-14 NOTE — Telephone Encounter (Signed)
Medication filled to pharmacy as requested.   

## 2015-02-15 ENCOUNTER — Encounter (HOSPITAL_COMMUNITY)
Admission: RE | Admit: 2015-02-15 | Discharge: 2015-02-15 | Disposition: A | Discharge status: Home / Self Care | Payer: Medicare Other | Source: Ambulatory Visit | Attending: Orthopedic Surgery | Admitting: Orthopedic Surgery

## 2015-02-15 ENCOUNTER — Encounter (HOSPITAL_COMMUNITY): Payer: Self-pay

## 2015-02-15 DIAGNOSIS — M1612 Unilateral primary osteoarthritis, left hip: Secondary | ICD-10-CM | POA: Insufficient documentation

## 2015-02-15 DIAGNOSIS — Z01812 Encounter for preprocedural laboratory examination: Secondary | ICD-10-CM | POA: Diagnosis not present

## 2015-02-15 DIAGNOSIS — Z0183 Encounter for blood typing: Secondary | ICD-10-CM | POA: Insufficient documentation

## 2015-02-15 DIAGNOSIS — K219 Gastro-esophageal reflux disease without esophagitis: Secondary | ICD-10-CM | POA: Insufficient documentation

## 2015-02-15 DIAGNOSIS — Z01818 Encounter for other preprocedural examination: Secondary | ICD-10-CM | POA: Diagnosis not present

## 2015-02-15 HISTORY — DX: Gastro-esophageal reflux disease without esophagitis: K21.9

## 2015-02-15 HISTORY — DX: Edema, unspecified: R60.9

## 2015-02-15 HISTORY — DX: Major depressive disorder, single episode, unspecified: F32.9

## 2015-02-15 HISTORY — DX: Personal history of other diseases of the digestive system: Z87.19

## 2015-02-15 HISTORY — DX: Personal history of colonic polyps: Z86.010

## 2015-02-15 HISTORY — DX: Effusion, unspecified joint: M25.40

## 2015-02-15 HISTORY — DX: Nocturia: R35.1

## 2015-02-15 HISTORY — DX: Pain in unspecified joint: M25.50

## 2015-02-15 HISTORY — DX: Localized edema: R60.0

## 2015-02-15 HISTORY — DX: Insomnia, unspecified: G47.00

## 2015-02-15 HISTORY — DX: Personal history of colon polyps, unspecified: Z86.0100

## 2015-02-15 HISTORY — DX: Depression, unspecified: F32.A

## 2015-02-15 HISTORY — DX: Personal history of other medical treatment: Z92.89

## 2015-02-15 HISTORY — DX: Anxiety disorder, unspecified: F41.9

## 2015-02-15 LAB — URINALYSIS, ROUTINE W REFLEX MICROSCOPIC
GLUCOSE, UA: NEGATIVE mg/dL
Hgb urine dipstick: NEGATIVE
KETONES UR: 15 mg/dL — AB
LEUKOCYTES UA: NEGATIVE
NITRITE: NEGATIVE
PH: 6 (ref 5.0–8.0)
Protein, ur: NEGATIVE mg/dL
SPECIFIC GRAVITY, URINE: 1.03 (ref 1.005–1.030)
Urobilinogen, UA: 1 mg/dL (ref 0.0–1.0)

## 2015-02-15 LAB — SURGICAL PCR SCREEN
MRSA, PCR: NEGATIVE
STAPHYLOCOCCUS AUREUS: NEGATIVE

## 2015-02-15 LAB — CBC WITH DIFFERENTIAL/PLATELET
BASOS PCT: 0 % (ref 0–1)
Basophils Absolute: 0 10*3/uL (ref 0.0–0.1)
EOS ABS: 0.2 10*3/uL (ref 0.0–0.7)
EOS PCT: 2 % (ref 0–5)
HCT: 39.9 % (ref 36.0–46.0)
HEMOGLOBIN: 13.2 g/dL (ref 12.0–15.0)
LYMPHS ABS: 1.5 10*3/uL (ref 0.7–4.0)
Lymphocytes Relative: 17 % (ref 12–46)
MCH: 32 pg (ref 26.0–34.0)
MCHC: 33.1 g/dL (ref 30.0–36.0)
MCV: 96.8 fL (ref 78.0–100.0)
MONOS PCT: 5 % (ref 3–12)
Monocytes Absolute: 0.4 10*3/uL (ref 0.1–1.0)
NEUTROS PCT: 76 % (ref 43–77)
Neutro Abs: 7.1 10*3/uL (ref 1.7–7.7)
PLATELETS: 301 10*3/uL (ref 150–400)
RBC: 4.12 MIL/uL (ref 3.87–5.11)
RDW: 13.5 % (ref 11.5–15.5)
WBC: 9.2 10*3/uL (ref 4.0–10.5)

## 2015-02-15 LAB — TYPE AND SCREEN
ABO/RH(D): O POS
Antibody Screen: NEGATIVE

## 2015-02-15 LAB — PROTIME-INR
INR: 1.12 (ref 0.00–1.49)
PROTHROMBIN TIME: 14.6 s (ref 11.6–15.2)

## 2015-02-15 LAB — BASIC METABOLIC PANEL
Anion gap: 9 (ref 5–15)
BUN: 8 mg/dL (ref 6–20)
CALCIUM: 9.2 mg/dL (ref 8.9–10.3)
CO2: 25 mmol/L (ref 22–32)
CREATININE: 1.08 mg/dL — AB (ref 0.44–1.00)
Chloride: 105 mmol/L (ref 101–111)
GFR calc non Af Amer: 52 mL/min — ABNORMAL LOW (ref >60)
Glucose, Bld: 87 mg/dL (ref 65–99)
Potassium: 2.9 mmol/L — ABNORMAL LOW (ref 3.5–5.1)
SODIUM: 139 mmol/L (ref 135–145)

## 2015-02-15 LAB — APTT: aPTT: 33 seconds (ref 24–37)

## 2015-02-15 MED ORDER — CHLORHEXIDINE GLUCONATE 4 % EX LIQD: 60.0000 mL | Freq: Once | CUTANEOUS | Status: DC

## 2015-02-15 NOTE — Progress Notes (Signed)
Spoke with Susan,at Dr.Rowan's office and notified of Potassium of 2.9

## 2015-02-15 NOTE — Telephone Encounter (Signed)
I advised the patient that the Rx has been faxed on 02/05/15 and she will need to call the pharmacy.      KP

## 2015-02-15 NOTE — Progress Notes (Addendum)
Cardiologist denies having one   Medical MD is Dr.Yvonne Lowne   Echo done 15+yrs ago  Stress test denies ever having one  Heart cath denies ever having one  EKG in epic from 06-15-14  CXR denies having one in past yr

## 2015-02-15 NOTE — Pre-Procedure Instructions (Signed)
Brandi [name]  02/15/2015      [id number] [address], [place] [address] [address] Phone: [phone] Fax: [phone]  [id number], [place] [place] [address] Phone: [phone] Fax: [phone]    Your procedure is scheduled on Mon, Sept 12 @ 9:45 AM  Report to [facility] Admitting at 7:45 AM  Call this number if you have problems the morning of surgery:  [phone]   Remember:  Do not eat food or drink liquids after midnight.  Take these medicines the morning of surgery with A SIP OF WATER Valium(Diazepam),Lexapro(Escitalopram),and Zantac(Ranitidine)             Stop using your Diclofenac gel a week prior to surgery. No Goody's,BC's,Aleve,Aspirin,Ibuprofen,Fish Oil,or any Herbal Medications.   Do not wear jewelry, make-up or nail polish.  Do not wear lotions, powders, or perfumes.  You may wear deodorant.  Do not shave 48 hours prior to surgery.    Do not bring valuables to the hospital.  [facility] is not responsible for any belongings or valuables.  Contacts, dentures or bridgework may not be worn into surgery.  Leave your suitcase in the car.  After surgery it may be brought to your room.  For patients admitted to the hospital, discharge time will be determined by your treatment team.  Patients discharged the day of surgery will not be allowed to drive home.    Special instructions:  [place] - Preparing for Surgery  Before surgery, you can play an important role.  Because skin is not sterile, your skin needs to be as free of germs as possible.  You can reduce the number of germs on you skin by washing with CHG (chlorahexidine gluconate) soap before surgery.  CHG is an antiseptic cleaner which kills germs and bonds with the skin to continue killing germs even after washing.  Please DO NOT use if you have an  allergy to CHG or antibacterial soaps.  If your skin becomes reddened/irritated stop using the CHG and inform your nurse when you arrive at Short Stay.  Do not shave (including legs and underarms) for at least 48 hours prior to the first CHG shower.  You may shave your face.  Please follow these instructions carefully:   1.  Shower with CHG Soap the night before surgery and the                                morning of Surgery.  2.  If you choose to wash your hair, wash your hair first as usual with your       normal shampoo.  3.  After you shampoo, rinse your hair and body thoroughly to remove the                      Shampoo.  4.  Use CHG as you would any other liquid soap.  You can apply chg directly       to the skin and wash gently with scrungie or a clean washcloth.  5.  Apply the CHG Soap to your body ONLY FROM THE NECK DOWN.        Do not use on open wounds or open sores.  Avoid contact with your eyes,       ears, mouth and  genitals (private parts).  Wash genitals (private parts)       with your normal soap.  6.  Wash thoroughly, paying special attention to the area where your surgery        will be performed.  7.  Thoroughly rinse your body with warm water from the neck down.  8.  DO NOT shower/wash with your normal soap after using and rinsing off       the CHG Soap.  9.  Pat yourself dry with a clean towel.            10.  Wear clean pajamas.            11.  Place clean sheets on your bed the night of your first shower and do not        sleep with pets.  Day of Surgery  Do not apply any lotions/deoderants the morning of surgery.  Please wear clean clothes to the hospital/surgery center.    Please read over the following fact sheets that you were given. Pain Booklet, Coughing and Deep Breathing, Blood Transfusion Information, MRSA Information and Surgical Site Infection Prevention

## 2015-02-16 NOTE — Progress Notes (Signed)
Anesthesia Chart Review:  Pt is 69 year old female scheduled for L total hip arthroplasty on 02/26/2015 with Dr. Mayer Camel.   PMH includes: GERD, anxiety, depression, arthritis. Never smoker. BMI 25.   Preoperative labs reviewed.  K 2.9. Notified Juliann Pulse in Dr. Damita Dunnings office. Will get I-stat 4 DOS to recheck.   Chest x-ray 02/15/2015 reviewed. There is no active cardiopulmonary disease. Hyperinflation may be voluntary or may reflect underlying reactive airway disease.  EKG 06/15/2014: Sinus rhythm with PACs. Prolonged QT  If labs acceptable DOS, I anticipate pt can proceed as scheduled.   Willeen Cass, FNP-BC [facility]/Anesthesiology Phone: [phone] 02/16/2015 2:09 PM

## 2015-02-20 ENCOUNTER — Ambulatory Visit: Payer: Medicare Other | Admitting: Family Medicine

## 2015-02-22 ENCOUNTER — Encounter: Payer: Self-pay | Admitting: Family Medicine

## 2015-02-22 ENCOUNTER — Ambulatory Visit: Payer: Medicare Other | Admitting: Family Medicine

## 2015-02-22 ENCOUNTER — Ambulatory Visit (INDEPENDENT_AMBULATORY_CARE_PROVIDER_SITE_OTHER): Payer: Medicare Other | Admitting: Family Medicine

## 2015-02-22 VITALS — BP 153/88 | HR 92 | Temp 98.6°F | Ht 65.0 in | Wt 142.6 lb

## 2015-02-22 DIAGNOSIS — E876 Hypokalemia: Secondary | ICD-10-CM | POA: Diagnosis not present

## 2015-02-22 DIAGNOSIS — Z01810 Encounter for preprocedural cardiovascular examination: Secondary | ICD-10-CM

## 2015-02-22 DIAGNOSIS — Z01818 Encounter for other preprocedural examination: Secondary | ICD-10-CM | POA: Diagnosis not present

## 2015-02-22 MED ORDER — POTASSIUM CHLORIDE CRYS ER 20 MEQ PO TBCR: 20.0000 meq | EXTENDED_RELEASE_TABLET | Freq: Every day | ORAL | Status: DC

## 2015-02-22 NOTE — Progress Notes (Signed)
Patient ID: Brandi [name], female    DOB: 1945-11-08  Age: 69 y.o. MRN: [id number]    Subjective:  Subjective HPI Brandi [name] presents for f/u hypokalemia.  Pt went to hospital for presurgical testing and K was low.  She was told to f/u with pcp for K supplement.  Pt is having ant approach L arthroplasty with Dr Mayer Camel on Monday.     Review of Systems  Constitutional: Negative for diaphoresis, appetite change, fatigue and unexpected weight change.  Eyes: Negative for pain, redness and visual disturbance.  Respiratory: Negative for cough, chest tightness, shortness of breath and wheezing.   Cardiovascular: Negative for chest pain, palpitations and leg swelling.  Endocrine: Negative for cold intolerance, heat intolerance, polydipsia, polyphagia and polyuria.  Genitourinary: Negative for dysuria, frequency and difficulty urinating.  Neurological: Negative for dizziness, light-headedness, numbness and headaches.    History Past Medical History  Diagnosis Date  . Osteoarthritis   . Chronic back pain   . Constipation   . Insomnia     takes Restoril and Trazodone nightly as needed  . Depression     takes Lexapro daily  . Anxiety     takes Valium daily as needed  . GERD (gastroesophageal reflux disease)     takes Zantac daily  . PONV (postoperative nausea and vomiting)   . Joint pain   . Joint swelling   . Peripheral edema     occasionally but never been on fluid pill  . Chronic back pain   . History of gastric ulcer   . History of GI bleed   . History of blood transfusion     no abnormal reaction noted  . History of colon polyps     benign  . Nocturia     She has past surgical history that includes Foot surgery (Right); Lumbar fusion; Shoulder surgery (Left); Esophagogastroduodenoscopy (N/A, 06/16/2014); Colonoscopy; and wisdom teeth extracted.   Her family history includes Cancer in her father and mother; Cancer (age of onset: 9) in her sister. There is no history of Heart  disease.She reports that she has never smoked. She has never used smokeless tobacco. She reports that she does not drink alcohol or use illicit drugs.  Current Outpatient Prescriptions on File Prior to Visit  Medication Sig Dispense Refill  . diazepam (VALIUM) 10 MG tablet Take 1 tablet (10 mg total) by mouth every 12 (twelve) hours as needed for anxiety. 45 tablet 2  . diclofenac sodium (VOLTAREN) 1 % GEL APPLY 2 G TOPICALLY 4 (FOUR) TIMES DAILY. 100 g 3  . escitalopram (LEXAPRO) 20 MG tablet Take 1 tablet (20 mg total) by mouth daily. 30 tablet 5  . estrogen, conjugated,-medroxyprogesterone (PREMPRO) 0.3-1.5 MG per tablet Take 1 tablet by mouth daily. 30 tablet 5  . Multiple Vitamins-Minerals (MULTIVITAMIN WITH MINERALS) tablet Take 1 tablet by mouth daily.    . ranitidine (ZANTAC) 150 MG tablet Take 1 tablet (150 mg total) by mouth 2 (two) times daily. 180 tablet 3  . temazepam (RESTORIL) 30 MG capsule 1 po qhs prn sleep (Patient taking differently: Take 30 mg by mouth at bedtime as needed. 1 po qhs prn sleep) 30 capsule 0  . traZODone (DESYREL) 50 MG tablet 1 po qhs prn 30 tablet 1   No current facility-administered medications on file prior to visit.     Objective:  Objective Physical Exam  Constitutional: She is oriented to person, place, and time. She appears well-developed and well-nourished.  HENT:  Head:  Normocephalic and atraumatic.  Eyes: Conjunctivae and EOM are normal.  Neck: Normal range of motion. Neck supple. No JVD present. Carotid bruit is not present. No thyromegaly present.  Cardiovascular: Normal rate, regular rhythm and normal heart sounds.   No murmur heard. Pulmonary/Chest: Effort normal and breath sounds normal. No respiratory distress. She has no wheezes. She has no rales. She exhibits no tenderness.  Musculoskeletal: She exhibits no edema.  Neurological: She is alert and oriented to person, place, and time.  Psychiatric: She has a normal mood and affect. Her  behavior is normal.   BP 153/88 mmHg  Pulse 92  Temp(Src) 98.6 F (37 C) (Oral)  Ht 5\' 5"  (1.651 m)  Wt 142 lb 9.6 oz (64.683 kg)  BMI 23.73 kg/m2  SpO2 100% Wt Readings from Last 3 Encounters:  02/22/15 142 lb 9.6 oz (64.683 kg)  02/15/15 144 lb 6.4 oz (65.5 kg)  01/30/15 151 lb 6.4 oz (68.675 kg)     Lab Results  Component Value Date   WBC 9.2 02/15/2015   HGB 13.2 02/15/2015   HCT 39.9 02/15/2015   PLT 301 02/15/2015   GLUCOSE 87 02/15/2015   ALT 11 06/29/2014   AST 17 06/29/2014   NA 139 02/15/2015   K 2.9* 02/15/2015   CL 105 02/15/2015   CREATININE 1.08* 02/15/2015   BUN 8 02/15/2015   CO2 25 02/15/2015   TSH 0.625 06/14/2013   INR 1.12 02/15/2015    Dg Chest 2 View  02/15/2015   CLINICAL DATA:  Preoperative exam prior total hip arthroplasty  EXAM: CHEST  2 VIEW  COMPARISON:  None in PACs  FINDINGS: The lungs are hyperinflated with hemidiaphragm flattening. There is no focal infiltrate. There is no pleural effusion. The heart and pulmonary vascularity are normal. The mediastinum is normal in width. The bony structures are unremarkable.  IMPRESSION: There is no active cardiopulmonary disease. Hyperinflation may be voluntary or may reflect underlying reactive airway disease.   Electronically Signed   By: David  Martinique M.D.   On: 02/15/2015 15:01     Assessment & Plan:  Plan I am having Ms. Crespin start on potassium chloride SA. I am also having her maintain her ranitidine, escitalopram, temazepam, diazepam, estrogen (conjugated)-medroxyprogesterone, diclofenac sodium, multivitamin with minerals, and traZODone.  Meds ordered this encounter  Medications  . potassium chloride SA (K-DUR,KLOR-CON) 20 MEQ tablet    Sig: Take 1 tablet (20 mEq total) by mouth daily.    Dispense:  30 tablet    Refill:  3    Problem List Items Addressed This Visit      Unprioritized   Pre-op evaluation - Primary    Pt needs to correct K before surgery could be done.   Will start KCL  today      Hypokalemia   Relevant Medications   potassium chloride SA (K-DUR,KLOR-CON) 20 MEQ tablet    Other Visit Diagnoses    Pre-operative cardiovascular examination        Relevant Orders    EKG 12-Lead (Completed)       Follow-up: Return if symptoms worsen or fail to improve.  Garnet Koyanagi, DO

## 2015-02-22 NOTE — Progress Notes (Signed)
Pre visit review using our clinic review tool, if applicable. No additional management support is needed unless otherwise documented below in the visit note. 

## 2015-02-22 NOTE — Patient Instructions (Signed)

## 2015-02-22 NOTE — Assessment & Plan Note (Signed)
Pt needs to correct K before surgery could be done.   Will start KCL today

## 2015-02-23 NOTE — Progress Notes (Signed)
Spoke with husband concerning new surgical arrival time of 0845AM for Monday 9/12

## 2015-02-23 NOTE — H&P (Signed)
TOTAL HIP ADMISSION H&P  Patient is admitted for left total hip arthroplasty.  Subjective:  Chief Complaint: left hip pain  HPI: Brandi [name], 69 y.o. female, has a history of pain and functional disability in the left hip(s) due to arthritis and patient has failed non-surgical conservative treatments for greater than 12 weeks to include NSAID's and/or analgesics, flexibility and strengthening excercises, supervised PT with diminished ADL's post treatment, use of assistive devices, weight reduction as appropriate and activity modification.  Onset of symptoms was gradual starting 2 years ago with gradually worsening course since that time.The patient noted no past surgery on the left hip(s).  Patient currently rates pain in the left hip at 10 out of 10 with activity. Patient has night pain, worsening of pain with activity and weight bearing, pain that interfers with activities of daily living and pain with passive range of motion. Patient has evidence of joint space narrowing by imaging studies. This condition presents safety issues increasing the risk of falls.   There is no current active infection.  Patient Active Problem List   Diagnosis Date Noted  . Pre-op evaluation 02/22/2015  . Sinusitis, chronic 01/30/2015  . Insomnia 01/30/2015  . Hip pain, chronic 12/26/2014  . Lymphadenopathy 11/24/2014  . Hypokalemia 06/15/2014  . Rectal bleeding 06/15/2014  . AKI (acute kidney injury) 06/15/2014  . Essential hypertension, benign 04/10/2014  . Tachycardia 04/10/2014  . GERD (gastroesophageal reflux disease) 11/08/2013  . Chronic constipation 11/08/2013   Past Medical History  Diagnosis Date  . Osteoarthritis   . Chronic back pain   . Constipation   . Insomnia     takes Restoril and Trazodone nightly as needed  . Depression     takes Lexapro daily  . Anxiety     takes Valium daily as needed  . GERD (gastroesophageal reflux disease)     takes Zantac daily  . PONV (postoperative  nausea and vomiting)   . Joint pain   . Joint swelling   . Peripheral edema     occasionally but never been on fluid pill  . Chronic back pain   . History of gastric ulcer   . History of GI bleed   . History of blood transfusion     no abnormal reaction noted  . History of colon polyps     benign  . Nocturia     Past Surgical History  Procedure Laterality Date  . Foot surgery Right   . Lumbar fusion    . Shoulder surgery Left   . Esophagogastroduodenoscopy N/A 06/16/2014    Procedure: ESOPHAGOGASTRODUODENOSCOPY (EGD);  Surgeon: Missy Sabins, MD;  Location: [facility] ENDOSCOPY;  Service: Endoscopy;  Laterality: N/A;  . Colonoscopy    . Wisdom teeth extracted      No prescriptions prior to admission   Allergies  Allergen Reactions  . Aspirin     Stomach ulcer    Social History  Substance Use Topics  . Smoking status: Never Smoker   . Smokeless tobacco: Never Used  . Alcohol Use: No    Family History  Problem Relation Age of Onset  . Cancer Mother     colon cancer  . Cancer Father     colon cancer  . Heart disease Neg Hx   . Cancer Sister 63    died of colon cancer     Review of Systems  Constitutional: Positive for malaise/fatigue.  Respiratory: Positive for shortness of breath.   Gastrointestinal: Positive for heartburn, nausea, abdominal  pain, diarrhea and constipation.       Ulcers  Genitourinary: Positive for urgency.  Musculoskeletal: Positive for myalgias and joint pain.       Weak bones and RA  Neurological: Positive for headaches.       Poor balance  Psychiatric/Behavioral: The patient has insomnia.     Objective:  Physical Exam  Constitutional: She is oriented to person, place, and time. She appears well-developed and well-nourished.  HENT:  Head: Normocephalic and atraumatic.  Eyes: Pupils are equal, round, and reactive to light.  Neck: Normal range of motion. Neck supple.  Cardiovascular: Intact distal pulses.   Respiratory: Effort normal.   Musculoskeletal:  she does have obvious pain with any hip motion bilaterally.  She has pain with flexion extension internal/external rotation and log roll.    Neurological: She is alert and oriented to person, place, and time.  Skin: Skin is warm and dry.  Psychiatric: She has a normal mood and affect. Her behavior is normal. Judgment and thought content normal.    Vital signs in last 24 hours: Temp:  [98.6 F (37 C)] 98.6 F (37 C) (09/08 1533) Pulse Rate:  [92-130] 92 (09/08 1550) BP: (153-170)/(88-110) 153/88 mmHg (09/08 1550) SpO2:  [100 %] 100 % (09/08 1533) Weight:  [64.683 kg (142 lb 9.6 oz)] 64.683 kg (142 lb 9.6 oz) (09/08 1533)  Labs:   Estimated body mass index is 25.67 kg/(m^2) as calculated from the following:   Height as of 12/26/14: 5\' 4"  (1.626 m).   Weight as of 01/05/15: 67.858 kg (149 lb 9.6 oz).   Imaging Review Plain radiographs demonstrate end stage DJD with cystic change, left hip greater than right, left bone-on-bone.   Assessment/Plan:  End stage arthritis, left hip(s)  The patient history, physical examination, clinical judgement of the provider and imaging studies are consistent with end stage degenerative joint disease of the left hip(s) and total hip arthroplasty is deemed medically necessary. The treatment options including medical management, injection therapy, arthroscopy and arthroplasty were discussed at length. The risks and benefits of total hip arthroplasty were presented and reviewed. The risks due to aseptic loosening, infection, stiffness, dislocation/subluxation,  thromboembolic complications and other imponderables were discussed.  The patient acknowledged the explanation, agreed to proceed with the plan and consent was signed. Patient is being admitted for inpatient treatment for surgery, pain control, PT, OT, prophylactic antibiotics, VTE prophylaxis, progressive ambulation and ADL's and discharge planning.The patient is planning to be  discharged home with home health services

## 2015-02-25 DIAGNOSIS — M1612 Unilateral primary osteoarthritis, left hip: Secondary | ICD-10-CM | POA: Diagnosis present

## 2015-02-25 MED ORDER — CEFAZOLIN SODIUM-DEXTROSE 2-3 GM-% IV SOLR
2.0000 g | INTRAVENOUS | Status: AC
  Administered 2015-02-26: 2 g via INTRAVENOUS
  Filled 2015-02-25: qty 50

## 2015-02-26 ENCOUNTER — Encounter (HOSPITAL_COMMUNITY): Payer: Self-pay | Admitting: Certified Registered Nurse Anesthetist

## 2015-02-26 ENCOUNTER — Inpatient Hospital Stay (HOSPITAL_COMMUNITY): Payer: Medicare Other | Admitting: Certified Registered Nurse Anesthetist

## 2015-02-26 ENCOUNTER — Inpatient Hospital Stay (HOSPITAL_COMMUNITY): Payer: Medicare Other | Admitting: Emergency Medicine

## 2015-02-26 ENCOUNTER — Inpatient Hospital Stay (HOSPITAL_COMMUNITY): Payer: Medicare Other

## 2015-02-26 ENCOUNTER — Encounter (HOSPITAL_COMMUNITY)
Admission: RE | Disposition: A | Discharge status: Skilled Nursing Facility | Payer: Self-pay | Source: Ambulatory Visit | Attending: Orthopedic Surgery

## 2015-02-26 ENCOUNTER — Inpatient Hospital Stay (HOSPITAL_COMMUNITY)
Admission: RE | Admit: 2015-02-26 | Discharge: 2015-03-01 | DRG: 470 | Disposition: A | Discharge status: Skilled Nursing Facility | Payer: Medicare Other | Source: Ambulatory Visit | Attending: Orthopedic Surgery | Admitting: Orthopedic Surgery

## 2015-02-26 DIAGNOSIS — I10 Essential (primary) hypertension: Secondary | ICD-10-CM | POA: Diagnosis not present

## 2015-02-26 DIAGNOSIS — K219 Gastro-esophageal reflux disease without esophagitis: Secondary | ICD-10-CM | POA: Diagnosis present

## 2015-02-26 DIAGNOSIS — M6281 Muscle weakness (generalized): Secondary | ICD-10-CM | POA: Diagnosis not present

## 2015-02-26 DIAGNOSIS — M169 Osteoarthritis of hip, unspecified: Secondary | ICD-10-CM | POA: Diagnosis not present

## 2015-02-26 DIAGNOSIS — F329 Major depressive disorder, single episode, unspecified: Secondary | ICD-10-CM | POA: Diagnosis present

## 2015-02-26 DIAGNOSIS — M1612 Unilateral primary osteoarthritis, left hip: Secondary | ICD-10-CM | POA: Diagnosis not present

## 2015-02-26 DIAGNOSIS — D62 Acute posthemorrhagic anemia: Secondary | ICD-10-CM | POA: Diagnosis not present

## 2015-02-26 DIAGNOSIS — Z471 Aftercare following joint replacement surgery: Secondary | ICD-10-CM | POA: Diagnosis not present

## 2015-02-26 DIAGNOSIS — R112 Nausea with vomiting, unspecified: Secondary | ICD-10-CM | POA: Diagnosis not present

## 2015-02-26 DIAGNOSIS — Z886 Allergy status to analgesic agent status: Secondary | ICD-10-CM

## 2015-02-26 DIAGNOSIS — M549 Dorsalgia, unspecified: Secondary | ICD-10-CM | POA: Diagnosis present

## 2015-02-26 DIAGNOSIS — F411 Generalized anxiety disorder: Secondary | ICD-10-CM | POA: Diagnosis not present

## 2015-02-26 DIAGNOSIS — R2681 Unsteadiness on feet: Secondary | ICD-10-CM | POA: Diagnosis not present

## 2015-02-26 DIAGNOSIS — G8929 Other chronic pain: Secondary | ICD-10-CM | POA: Diagnosis not present

## 2015-02-26 DIAGNOSIS — K5900 Constipation, unspecified: Secondary | ICD-10-CM | POA: Diagnosis present

## 2015-02-26 DIAGNOSIS — Z96642 Presence of left artificial hip joint: Secondary | ICD-10-CM | POA: Diagnosis not present

## 2015-02-26 DIAGNOSIS — M1610 Unilateral primary osteoarthritis, unspecified hip: Secondary | ICD-10-CM | POA: Diagnosis present

## 2015-02-26 DIAGNOSIS — F419 Anxiety disorder, unspecified: Secondary | ICD-10-CM | POA: Diagnosis present

## 2015-02-26 DIAGNOSIS — M1990 Unspecified osteoarthritis, unspecified site: Secondary | ICD-10-CM | POA: Diagnosis not present

## 2015-02-26 DIAGNOSIS — G4700 Insomnia, unspecified: Secondary | ICD-10-CM | POA: Diagnosis present

## 2015-02-26 DIAGNOSIS — M25552 Pain in left hip: Secondary | ICD-10-CM | POA: Diagnosis not present

## 2015-02-26 DIAGNOSIS — K210 Gastro-esophageal reflux disease with esophagitis: Secondary | ICD-10-CM | POA: Diagnosis not present

## 2015-02-26 DIAGNOSIS — Z419 Encounter for procedure for purposes other than remedying health state, unspecified: Secondary | ICD-10-CM

## 2015-02-26 HISTORY — PX: TOTAL HIP ARTHROPLASTY: SHX124

## 2015-02-26 LAB — POCT I-STAT 4, (NA,K, GLUC, HGB,HCT)
Glucose, Bld: 86 mg/dL (ref 65–99)
HEMATOCRIT: 44 % (ref 36.0–46.0)
HEMOGLOBIN: 15 g/dL (ref 12.0–15.0)
POTASSIUM: 3.4 mmol/L — AB (ref 3.5–5.1)
SODIUM: 144 mmol/L (ref 135–145)

## 2015-02-26 SURGERY — ARTHROPLASTY, HIP, TOTAL, ANTERIOR APPROACH
Anesthesia: Monitor Anesthesia Care | Site: Hip | Laterality: Left

## 2015-02-26 MED ORDER — PHENOL 1.4 % MT LIQD: 1.0000 | OROMUCOSAL | Status: DC | PRN

## 2015-02-26 MED ORDER — MIDAZOLAM HCL 5 MG/5ML IJ SOLN
INTRAMUSCULAR | Status: DC | PRN
  Administered 2015-02-26 (×2): 1 mg via INTRAVENOUS

## 2015-02-26 MED ORDER — HYDROMORPHONE HCL 1 MG/ML IJ SOLN
0.5000 mg | INTRAMUSCULAR | Status: DC | PRN
  Administered 2015-02-26: 0.5 mg via INTRAVENOUS
  Administered 2015-02-27 – 2015-02-28 (×2): 1 mg via INTRAVENOUS
  Filled 2015-02-26 (×3): qty 1

## 2015-02-26 MED ORDER — METOCLOPRAMIDE HCL 5 MG PO TABS: 5.0000 mg | ORAL_TABLET | Freq: Three times a day (TID) | ORAL | Status: DC | PRN

## 2015-02-26 MED ORDER — HYDROMORPHONE HCL 1 MG/ML IJ SOLN
0.2500 mg | INTRAMUSCULAR | Status: DC | PRN
  Administered 2015-02-26 (×3): 0.5 mg via INTRAVENOUS

## 2015-02-26 MED ORDER — TIZANIDINE HCL 2 MG PO CAPS: 2.0000 mg | ORAL_CAPSULE | Freq: Three times a day (TID) | ORAL | Status: DC

## 2015-02-26 MED ORDER — ACETAMINOPHEN 650 MG RE SUPP
650.0000 mg | Freq: Four times a day (QID) | RECTAL | Status: DC | PRN
  Filled 2015-02-26: qty 1

## 2015-02-26 MED ORDER — METHOCARBAMOL 500 MG PO TABS
500.0000 mg | ORAL_TABLET | Freq: Four times a day (QID) | ORAL | Status: DC | PRN
  Administered 2015-02-26 – 2015-03-01 (×7): 500 mg via ORAL
  Filled 2015-02-26 (×8): qty 1

## 2015-02-26 MED ORDER — ALUMINUM HYDROXIDE GEL 320 MG/5ML PO SUSP: 15.0000 mL | ORAL | Status: DC | PRN

## 2015-02-26 MED ORDER — METHOCARBAMOL 500 MG PO TABS
ORAL_TABLET | ORAL | Status: AC
  Filled 2015-02-26: qty 1

## 2015-02-26 MED ORDER — METHOCARBAMOL 1000 MG/10ML IJ SOLN
500.0000 mg | Freq: Four times a day (QID) | INTRAVENOUS | Status: DC | PRN
  Filled 2015-02-26: qty 5

## 2015-02-26 MED ORDER — TRANEXAMIC ACID 1000 MG/10ML IV SOLN
1000.0000 mg | INTRAVENOUS | Status: AC
  Administered 2015-02-26: 1000 mg via INTRAVENOUS
  Filled 2015-02-26: qty 10

## 2015-02-26 MED ORDER — LACTATED RINGERS IV SOLN
INTRAVENOUS | Status: DC | PRN
  Administered 2015-02-26 (×2): via INTRAVENOUS

## 2015-02-26 MED ORDER — PROPOFOL INFUSION 10 MG/ML OPTIME
INTRAVENOUS | Status: DC | PRN
  Administered 2015-02-26: 50 ug/kg/min via INTRAVENOUS

## 2015-02-26 MED ORDER — MEDROXYPROGESTERONE ACETATE 2.5 MG PO TABS: 2.5000 mg | ORAL_TABLET | Freq: Every day | ORAL | Status: DC

## 2015-02-26 MED ORDER — ASPIRIN EC 325 MG PO TBEC
325.0000 mg | DELAYED_RELEASE_TABLET | Freq: Every day | ORAL | Status: DC
  Administered 2015-02-27 – 2015-03-01 (×3): 325 mg via ORAL
  Filled 2015-02-26 (×3): qty 1

## 2015-02-26 MED ORDER — CONJ ESTROG-MEDROXYPROGEST ACE 0.3-1.5 MG PO TABS: 1.0000 | ORAL_TABLET | Freq: Every day | ORAL | Status: DC

## 2015-02-26 MED ORDER — PROPOFOL 10 MG/ML IV BOLUS
INTRAVENOUS | Status: AC
  Filled 2015-02-26: qty 20

## 2015-02-26 MED ORDER — FAMOTIDINE 20 MG PO TABS
20.0000 mg | ORAL_TABLET | Freq: Every day | ORAL | Status: DC
  Administered 2015-02-26 – 2015-03-01 (×4): 20 mg via ORAL
  Filled 2015-02-26 (×4): qty 1

## 2015-02-26 MED ORDER — BUPIVACAINE HCL (PF) 0.5 % IJ SOLN
INTRAMUSCULAR | Status: DC | PRN
  Administered 2015-02-26: 3 mL via INTRATHECAL

## 2015-02-26 MED ORDER — ASPIRIN EC 325 MG PO TBEC: 325.0000 mg | DELAYED_RELEASE_TABLET | Freq: Two times a day (BID) | ORAL | Status: DC

## 2015-02-26 MED ORDER — HYDROMORPHONE HCL 1 MG/ML IJ SOLN
INTRAMUSCULAR | Status: AC
  Administered 2015-02-26: 0.5 mg via INTRAVENOUS
  Filled 2015-02-26: qty 1

## 2015-02-26 MED ORDER — OXYCODONE-ACETAMINOPHEN 10-325 MG PO TABS: 1.0000 | ORAL_TABLET | ORAL | Status: DC | PRN

## 2015-02-26 MED ORDER — OXYCODONE HCL 5 MG PO TABS
ORAL_TABLET | ORAL | Status: AC
  Filled 2015-02-26: qty 2

## 2015-02-26 MED ORDER — TRAZODONE HCL 50 MG PO TABS
50.0000 mg | ORAL_TABLET | Freq: Every evening | ORAL | Status: DC | PRN
  Administered 2015-03-01: 50 mg via ORAL
  Filled 2015-02-26: qty 1

## 2015-02-26 MED ORDER — MENTHOL 3 MG MT LOZG
1.0000 | LOZENGE | OROMUCOSAL | Status: DC | PRN
Start: 2015-02-26 — End: 2015-03-01

## 2015-02-26 MED ORDER — MIDAZOLAM HCL 2 MG/2ML IJ SOLN
INTRAMUSCULAR | Status: AC
  Filled 2015-02-26: qty 4

## 2015-02-26 MED ORDER — DIAZEPAM 5 MG PO TABS
10.0000 mg | ORAL_TABLET | Freq: Two times a day (BID) | ORAL | Status: DC | PRN
  Administered 2015-03-01: 10 mg via ORAL
  Filled 2015-02-26: qty 2

## 2015-02-26 MED ORDER — DIPHENHYDRAMINE HCL 12.5 MG/5ML PO ELIX: 12.5000 mg | ORAL_SOLUTION | ORAL | Status: DC | PRN

## 2015-02-26 MED ORDER — FENTANYL CITRATE (PF) 250 MCG/5ML IJ SOLN
INTRAMUSCULAR | Status: AC
  Filled 2015-02-26: qty 5

## 2015-02-26 MED ORDER — ESTROGENS CONJUGATED 0.3 MG PO TABS: 0.3000 mg | ORAL_TABLET | Freq: Every day | ORAL | Status: DC

## 2015-02-26 MED ORDER — PENICILLIN V POTASSIUM 500 MG PO TABS
500.0000 mg | ORAL_TABLET | Freq: Two times a day (BID) | ORAL | Status: DC
  Administered 2015-02-26 – 2015-03-01 (×6): 500 mg via ORAL
  Filled 2015-02-26 (×7): qty 1

## 2015-02-26 MED ORDER — ONDANSETRON HCL 4 MG PO TABS: 4.0000 mg | ORAL_TABLET | Freq: Four times a day (QID) | ORAL | Status: DC | PRN

## 2015-02-26 MED ORDER — OXYCODONE HCL 5 MG/5ML PO SOLN: 5.0000 mg | Freq: Once | ORAL | Status: DC | PRN

## 2015-02-26 MED ORDER — PHENYLEPHRINE HCL 10 MG/ML IJ SOLN
INTRAMUSCULAR | Status: DC | PRN
  Administered 2015-02-26: 80 ug via INTRAVENOUS
  Administered 2015-02-26: 40 ug via INTRAVENOUS

## 2015-02-26 MED ORDER — DEXTROSE-NACL 5-0.45 % IV SOLN: INTRAVENOUS | Status: DC

## 2015-02-26 MED ORDER — TRANEXAMIC ACID 1000 MG/10ML IV SOLN
2000.0000 mg | INTRAVENOUS | Status: DC
  Filled 2015-02-26: qty 20

## 2015-02-26 MED ORDER — METOCLOPRAMIDE HCL 5 MG/ML IJ SOLN: 5.0000 mg | Freq: Three times a day (TID) | INTRAMUSCULAR | Status: DC | PRN

## 2015-02-26 MED ORDER — MEDROXYPROGESTERONE ACETATE 2.5 MG PO TABS
2.5000 mg | ORAL_TABLET | Freq: Every day | ORAL | Status: DC
  Administered 2015-02-26 – 2015-03-01 (×4): 2.5 mg via ORAL
  Filled 2015-02-26 (×4): qty 1

## 2015-02-26 MED ORDER — ACETAMINOPHEN 325 MG PO TABS
650.0000 mg | ORAL_TABLET | Freq: Four times a day (QID) | ORAL | Status: DC | PRN
  Administered 2015-02-26 – 2015-02-27 (×2): 650 mg via ORAL
  Filled 2015-02-26 (×2): qty 2

## 2015-02-26 MED ORDER — TEMAZEPAM 15 MG PO CAPS
30.0000 mg | ORAL_CAPSULE | Freq: Every evening | ORAL | Status: DC | PRN
  Administered 2015-02-27 – 2015-02-28 (×2): 30 mg via ORAL
  Filled 2015-02-26 (×2): qty 2

## 2015-02-26 MED ORDER — ACETAMINOPHEN 160 MG/5ML PO SOLN
325.0000 mg | ORAL | Status: DC | PRN
  Filled 2015-02-26: qty 20.3

## 2015-02-26 MED ORDER — POTASSIUM CHLORIDE CRYS ER 20 MEQ PO TBCR
20.0000 meq | EXTENDED_RELEASE_TABLET | Freq: Every day | ORAL | Status: DC
  Administered 2015-02-26 – 2015-03-01 (×4): 20 meq via ORAL
  Filled 2015-02-26 (×4): qty 1

## 2015-02-26 MED ORDER — ESTROGENS CONJUGATED 0.3 MG PO TABS
0.3000 mg | ORAL_TABLET | Freq: Every day | ORAL | Status: DC
  Administered 2015-02-26 – 2015-03-01 (×4): 0.3 mg via ORAL
  Filled 2015-02-26 (×4): qty 1

## 2015-02-26 MED ORDER — OXYCODONE HCL 5 MG PO TABS: 5.0000 mg | ORAL_TABLET | Freq: Once | ORAL | Status: DC | PRN

## 2015-02-26 MED ORDER — SODIUM CHLORIDE 0.9 % IR SOLN
Status: DC | PRN
  Administered 2015-02-26: 1000 mL

## 2015-02-26 MED ORDER — ACETAMINOPHEN 325 MG PO TABS
ORAL_TABLET | ORAL | Status: AC
  Filled 2015-02-26: qty 2

## 2015-02-26 MED ORDER — TRANEXAMIC ACID 1000 MG/10ML IV SOLN
2000.0000 mg | INTRAVENOUS | Status: DC | PRN
  Administered 2015-02-26: 2000 mg via TOPICAL

## 2015-02-26 MED ORDER — ONDANSETRON HCL 4 MG/2ML IJ SOLN
4.0000 mg | Freq: Four times a day (QID) | INTRAMUSCULAR | Status: DC | PRN
  Administered 2015-02-27 – 2015-03-01 (×3): 4 mg via INTRAVENOUS
  Filled 2015-02-26 (×4): qty 2

## 2015-02-26 MED ORDER — OXYCODONE HCL 5 MG PO TABS
5.0000 mg | ORAL_TABLET | ORAL | Status: DC | PRN
  Administered 2015-02-26 – 2015-03-01 (×15): 10 mg via ORAL
  Filled 2015-02-26 (×15): qty 2

## 2015-02-26 MED ORDER — DEXAMETHASONE SODIUM PHOSPHATE 10 MG/ML IJ SOLN
10.0000 mg | Freq: Once | INTRAMUSCULAR | Status: AC
  Administered 2015-02-27: 10 mg via INTRAVENOUS
  Filled 2015-02-26: qty 1

## 2015-02-26 MED ORDER — ESCITALOPRAM OXALATE 20 MG PO TABS
20.0000 mg | ORAL_TABLET | Freq: Every day | ORAL | Status: DC
  Administered 2015-02-26 – 2015-03-01 (×4): 20 mg via ORAL
  Filled 2015-02-26: qty 2
  Filled 2015-02-26 (×2): qty 1
  Filled 2015-02-26: qty 2
  Filled 2015-02-26: qty 1
  Filled 2015-02-26 (×2): qty 2
  Filled 2015-02-26: qty 1

## 2015-02-26 MED ORDER — ACETAMINOPHEN 325 MG PO TABS: 325.0000 mg | ORAL_TABLET | ORAL | Status: DC | PRN

## 2015-02-26 MED ORDER — LACTATED RINGERS IV SOLN
INTRAVENOUS | Status: DC
  Administered 2015-02-26: 08:00:00 via INTRAVENOUS

## 2015-02-26 MED ORDER — EPHEDRINE SULFATE 50 MG/ML IJ SOLN
INTRAMUSCULAR | Status: DC | PRN
  Administered 2015-02-26 (×3): 10 mg via INTRAVENOUS

## 2015-02-26 MED ORDER — FENTANYL CITRATE (PF) 100 MCG/2ML IJ SOLN
INTRAMUSCULAR | Status: DC | PRN
Start: 2015-02-26 — End: 2015-02-26
  Administered 2015-02-26 (×5): 50 ug via INTRAVENOUS

## 2015-02-26 SURGICAL SUPPLY — 63 items
ADH SKN CLS APL DERMABOND .7 (GAUZE/BANDAGES/DRESSINGS)
BAG DECANTER FOR FLEXI CONT (MISCELLANEOUS) ×2 IMPLANT
BLADE SAW SGTL 18X1.27X75 (BLADE) ×2 IMPLANT
BLADE SAW SGTL 18X1.27X75MM (BLADE) ×1
BLADE SURG ROTATE 9660 (MISCELLANEOUS) IMPLANT
BNDG COHESIVE 3X5 TAN STRL LF (GAUZE/BANDAGES/DRESSINGS) ×2 IMPLANT
CAPT HIP TOTAL 2 ×2 IMPLANT
CELLS DAT CNTRL 66122 CELL SVR (MISCELLANEOUS) ×1 IMPLANT
COVER BACK TABLE 24X17X13 BIG (DRAPES) IMPLANT
COVER SURGICAL LIGHT HANDLE (MISCELLANEOUS) ×3 IMPLANT
DERMABOND ADVANCED (GAUZE/BANDAGES/DRESSINGS)
DERMABOND ADVANCED .7 DNX12 (GAUZE/BANDAGES/DRESSINGS) ×1 IMPLANT
DRAPE C-ARM 42X72 X-RAY (DRAPES) ×3 IMPLANT
DRAPE IMP U-DRAPE 54X76 (DRAPES) ×3 IMPLANT
DRAPE STERI IOBAN 125X83 (DRAPES) ×3 IMPLANT
DRAPE U-SHAPE 47X51 STRL (DRAPES) ×9 IMPLANT
DRSG AQUACEL AG ADV 3.5X10 (GAUZE/BANDAGES/DRESSINGS) ×3 IMPLANT
DRSG TEGADERM 4X4.75 (GAUZE/BANDAGES/DRESSINGS) ×1 IMPLANT
DURAPREP 26ML APPLICATOR (WOUND CARE) ×3 IMPLANT
ELECT BLADE 4.0 EZ CLEAN MEGAD (MISCELLANEOUS) ×3
ELECT BLADE TIP CTD 4 INCH (ELECTRODE) IMPLANT
ELECT REM PT RETURN 9FT ADLT (ELECTROSURGICAL) ×3
ELECTRODE BLDE 4.0 EZ CLN MEGD (MISCELLANEOUS) IMPLANT
ELECTRODE REM PT RTRN 9FT ADLT (ELECTROSURGICAL) ×1 IMPLANT
EVACUATOR 1/8 PVC DRAIN (DRAIN) ×1 IMPLANT
FACESHIELD WRAPAROUND (MASK) ×3 IMPLANT
FACESHIELD WRAPAROUND OR TEAM (MASK) ×1 IMPLANT
GAUZE SPONGE 2X2 8PLY STRL LF (GAUZE/BANDAGES/DRESSINGS) ×1 IMPLANT
GLOVE BIO SURGEON STRL SZ7.5 (GLOVE) ×3 IMPLANT
GLOVE BIO SURGEON STRL SZ8.5 (GLOVE) ×6 IMPLANT
GLOVE BIOGEL PI IND STRL 8 (GLOVE) ×1 IMPLANT
GLOVE BIOGEL PI IND STRL 9 (GLOVE) ×1 IMPLANT
GLOVE BIOGEL PI INDICATOR 8 (GLOVE) ×2
GLOVE BIOGEL PI INDICATOR 9 (GLOVE) ×2
GOWN BRE IMP PREV XXLGXLNG (GOWN DISPOSABLE) ×3 IMPLANT
GOWN STRL REIN 3XL XLG LVL4 (GOWN DISPOSABLE) ×3 IMPLANT
GOWN STRL REUS W/ TWL LRG LVL3 (GOWN DISPOSABLE) ×2 IMPLANT
GOWN STRL REUS W/TWL LRG LVL3 (GOWN DISPOSABLE) ×6
KIT BASIN OR (CUSTOM PROCEDURE TRAY) ×3 IMPLANT
KIT ROOM TURNOVER OR (KITS) ×3 IMPLANT
MANIFOLD NEPTUNE II (INSTRUMENTS) ×3 IMPLANT
NS IRRIG 1000ML POUR BTL (IV SOLUTION) ×3 IMPLANT
PACK TOTAL JOINT (CUSTOM PROCEDURE TRAY) ×3 IMPLANT
PACK UNIVERSAL I (CUSTOM PROCEDURE TRAY) ×3 IMPLANT
PAD ARMBOARD 7.5X6 YLW CONV (MISCELLANEOUS) ×6 IMPLANT
RESTRAINT LIMB HOLDER UNIV (RESTRAINTS) ×1 IMPLANT
RETRACTOR WND ALEXIS 18 MED (MISCELLANEOUS) ×1 IMPLANT
RTRCTR WOUND ALEXIS 18CM MED (MISCELLANEOUS) ×3
SPONGE GAUZE 2X2 STER 10/PKG (GAUZE/BANDAGES/DRESSINGS)
SPONGE LAP 4X18 X RAY DECT (DISPOSABLE) IMPLANT
STAPLER VISISTAT 35W (STAPLE) ×3 IMPLANT
SUCTION FRAZIER TIP 10 FR DISP (SUCTIONS) ×3 IMPLANT
SUT ETHIBOND NAB CT1 #1 30IN (SUTURE) ×4 IMPLANT
SUT MNCRL AB 4-0 PS2 18 (SUTURE) ×1 IMPLANT
SUT VIC AB 1 CT1 27 (SUTURE) ×6
SUT VIC AB 1 CT1 27XBRD ANBCTR (SUTURE) ×4 IMPLANT
SUT VIC AB 2-0 CT1 27 (SUTURE) ×6
SUT VIC AB 2-0 CT1 TAPERPNT 27 (SUTURE) ×2 IMPLANT
SUT VLOC 180 0 24IN GS25 (SUTURE) ×3 IMPLANT
TOWEL OR 17X24 6PK STRL BLUE (TOWEL DISPOSABLE) ×3 IMPLANT
TOWEL OR 17X26 10 PK STRL BLUE (TOWEL DISPOSABLE) ×3 IMPLANT
TRAY FOLEY CATH 16FRSI W/METER (SET/KITS/TRAYS/PACK) IMPLANT
WATER STERILE IRR 1000ML POUR (IV SOLUTION) ×3 IMPLANT

## 2015-02-26 NOTE — Plan of Care (Signed)
Problem: Consults Goal: Diagnosis- Total Joint Replacement Primary Total Hip Left     

## 2015-02-26 NOTE — Progress Notes (Signed)
Utilization review completed.  

## 2015-02-26 NOTE — Anesthesia Preprocedure Evaluation (Signed)
Anesthesia Evaluation  Patient identified by MRN, date of birth, ID band Patient awake    Reviewed: Allergy & Precautions, NPO status , Patient's Chart, lab work & pertinent test results  History of Anesthesia Complications (+) PONV and history of anesthetic complications  Airway Mallampati: II  TM Distance: >3 FB Neck ROM: Full    Dental  (+) Teeth Intact   Pulmonary neg pulmonary ROS,    breath sounds clear to auscultation- rhonchi       Cardiovascular hypertension,  Rhythm:Regular     Neuro/Psych PSYCHIATRIC DISORDERS Anxiety Depression negative neurological ROS     GI/Hepatic Neg liver ROS, GERD  Medicated and Controlled,  Endo/Other  negative endocrine ROS  Renal/GU negative Renal ROS     Musculoskeletal  (+) Arthritis ,   Abdominal   Peds  Hematology negative hematology ROS (+)   Anesthesia Other Findings   Reproductive/Obstetrics                             Anesthesia Physical Anesthesia Plan  ASA: II  Anesthesia Plan: MAC and Spinal   Post-op Pain Management:    Induction:   Airway Management Planned: Nasal Cannula  Additional Equipment: None  Intra-op Plan:   Post-operative Plan:   Informed Consent: I have reviewed the patients History and Physical, chart, labs and discussed the procedure including the risks, benefits and alternatives for the proposed anesthesia with the patient or authorized representative who has indicated his/her understanding and acceptance.   Dental advisory given  Plan Discussed with: CRNA and Surgeon  Anesthesia Plan Comments:         Anesthesia Quick Evaluation

## 2015-02-26 NOTE — Progress Notes (Signed)
Rec'd call from case management regarding pt's listed allergy of aspirin and order in computer for aspirin.  Dr. Mayer Camel notified, states him and patient had a discussion regarding taking aspirin and that patient states it is not a true allergy and she is ok to take it.  Order not dc'd at this time per Dr. Mayer Camel.

## 2015-02-26 NOTE — Discharge Instructions (Signed)

## 2015-02-26 NOTE — Op Note (Signed)
OPERATIVE REPORT    DATE OF PROCEDURE:  02/26/2015       PREOPERATIVE DIAGNOSIS:  PRIMARY OSTEOARTHRITIS OF THE LEFT HIP                                                          POSTOPERATIVE DIAGNOSIS:  PRIMARY OSTEOARTHRITIS OF THE LEFT HIP                                                           PROCEDURE: Anterior L total hip arthroplasty using a 50 mm DePuy Pinnacle  Cup, [employer], 0-degree polyethylene liner, a +0 32 mm ceramic head, a 3 Depuy Triloc stem   SURGEON: Hinley Brimage J    ASSISTANT:   Eric K. [employer]  (present throughout entire procedure and necessary for timely completion of the procedure)   ANESTHESIA: spinal BLOOD LOSS: 400 FLUID REPLACEMENT: 1600 crystalloid Antibiotic: 2gm ancef Tranexamic Acid: 1gm iv 2gm topical COMPLICATIONS: none    INDICATIONS FOR PROCEDURE: A 69 y.o. year-old With  PRIMARY OSTEOARTHRITIS OF THE LEFT HIP   for 3 years, x-rays show bone-on-bone arthritic changes. Despite conservative measures with observation, anti-inflammatory medicine, narcotics, use of a cane, has severe unremitting pain and can ambulate only a few blocks before resting. Patient desires elective L total hip arthroplasty to decrease pain and increase function. The risks, benefits, and alternatives were discussed at length including but not limited to the risks of infection, bleeding, nerve injury, stiffness, blood clots, the need for revision surgery, cardiopulmonary complications, among others, and they were willing to proceed. Questions answered     PROCEDURE IN DETAIL: The patient was identified by armband,  received preoperative IV antibiotics in the holding area at [facility], taken to the operating room , appropriate anesthetic monitors  were attached and spinal anesthesia was induced with the patienton the gurney. The HANA boots were applied to the feet and he was then transferred to the HANA table with a peroneal post and support  underneath the R leg which was locked in extension. The left lower extremity was then prepped and draped in the usual sterile fashion from just above the iliac crest to the knee. And a timeout procedure was performed. We then made a 14 cm incision along the interval at the leading edge of the tensor fascia lata of starting at 2 cm lateral to and 2 cm distal to the ASIS. Small bleeders in the skin and subcutaneous tissue identified and cauterized we dissected down to the fascia and made an incision in the fascia allowing [place] to elevate the fascia of the tensor muscle and exploited the interval between the rectus and the tensor fascia lot of. A Hohmann retractor was then placed along the superior neck of the femur and a Cobra retractor along the inferior neck of the femur we teed the capsule starting out at the superior anterior aspect of the acetabulum going distally and made the T along the neck both leaflets of the T were tagged with #2 Ethibond suture. Cobra retractors were then placed along the inferior and superior neck allowing [place] to  perform a standard neck cut and removed the femoral head with a power corkscrew. We then placed a right angle Hohmann retractor along the anterior aspect of the acetabulum a spiked Cobra in the cotyloid notch and posteriorly another spiked Cobra. We then sequentially reamed up to a 49 mm basket reamer obtaining good coverage in all quadrants and this is verified by C-arm imaging. Under C-arm control with and hammered into place a 54 mm Pinnacle cup in 45 of abduction and 15 of anteversion. The cup seated nicely and required no supplemental screws. We then placed a central hole Eliminator and a 0 polyethylene liner to accept a 36 mm head. The foot was then externally rotated to 100, the HANA elevator was placed around the flare of the greater trochanter and the limb was extended and abducted delivering the proximal femur up into the wound. A medium Hohmann retractor was placed  over the greater trochanter and a Mueller retractor along the posterior femoral neck completing the exposure. We then performed releases superiorly and and inferiorly of the capsule going back to the pirformis fossa superiorly and to the lesser trochanter inferiorly. We then entered the proximal femur with the box cutting offset chisel followed by the chili pepper and broaching up to a 3 broach. This seated nicely and we reamed the calcar. A trial reduction was performed with a 1.5 mm 36 mm head and the stem appeared to be slightly undersized. One more trial reduction was performed the limb lengths were excellent the hip was stable in 90 of external rotation. At this point the trial components removed and we hammered into place a #3 Tri-Lock stem with Gryption coating. This is a standard offset stem and a +1.5 32 mm ceramic ball was then hammered into place the hip was reduced and final C-arm images obtained. The wound is thoroughly irrigated with normal saline solution we then packed the sponge with [employer] a casted into the wound and closed the fascia of the tensor fascia lot a with running the lock suture the subcutaneous tissue was closed with 2-0 and 3-0 Vicryl suture followed by an Aquasol dressing. At this point the patient was transferred to hospital gurney without difficulty. The subcutaneous  tissue with 0 and 2-0 undyed Vicryl suture and the skin with running  3-0 vicryl subcuticular suture. Aquacil dressing was applied. The patient was then unclamped, rolled supine, awaken extubated and taken to recovery room without difficulty in stable condition.   Kerin Salen 02/26/2015, 12:24 PM

## 2015-02-26 NOTE — Transfer of Care (Signed)
Immediate Anesthesia Transfer of Care Note  Patient: Brandi [name]  Procedure(s) Performed: Procedure(s): TOTAL HIP ARTHROPLASTY ANTERIOR APPROACH (Left)  Patient Location: PACU  Anesthesia Type:MAC  Level of Consciousness: awake, alert , oriented and patient cooperative  Airway & Oxygen Therapy: Patient Spontanous Breathing and Patient connected to nasal cannula oxygen  Post-op Assessment: Report given to RN and Post -op Vital signs reviewed and stable  Post vital signs: Reviewed and stable  Last Vitals:  120/68 HR 84 RR 19 SpO2 370 2L   Complications: No apparent anesthesia complications

## 2015-02-26 NOTE — Anesthesia Procedure Notes (Addendum)
Spinal Patient location during procedure: OR Staffing Anesthesiologist: Payden Docter Preanesthetic Checklist Completed: patient identified, surgical consent, pre-op evaluation, timeout performed, IV checked, risks and benefits discussed and monitors and equipment checked Spinal Block Patient position: sitting Prep: site prepped and draped and DuraPrep Patient monitoring: heart rate, cardiac monitor, continuous pulse ox and blood pressure Approach: midline Location: L3-4 Injection technique: single-shot Needle Needle type: Pencan  Needle gauge: 24 G Needle length: 10 cm Assessment Sensory level: T8   

## 2015-02-26 NOTE — Interval H&P Note (Signed)
History and Physical Interval Note:  02/26/2015 10:16 AM  Brandi [name]  has presented today for surgery, with the diagnosis of PRIMARY OSTEOARTHRITIS OF THE LEFT HIP  The various methods of treatment have been discussed with the patient and family. After consideration of risks, benefits and other options for treatment, the patient has consented to  Procedure(s): TOTAL HIP ARTHROPLASTY ANTERIOR APPROACH (Left) as a surgical intervention .  The patient's history has been reviewed, patient examined, no change in status, stable for surgery.  I have reviewed the patient's chart and labs.  Questions were answered to the patient's satisfaction.     Kerin Salen

## 2015-02-27 ENCOUNTER — Encounter (HOSPITAL_COMMUNITY): Payer: Self-pay | Admitting: Orthopedic Surgery

## 2015-02-27 LAB — BASIC METABOLIC PANEL
ANION GAP: 5 (ref 5–15)
BUN: 5 mg/dL — ABNORMAL LOW (ref 6–20)
CALCIUM: 8 mg/dL — AB (ref 8.9–10.3)
CHLORIDE: 101 mmol/L (ref 101–111)
CO2: 26 mmol/L (ref 22–32)
CREATININE: 0.88 mg/dL (ref 0.44–1.00)
GFR calc non Af Amer: >60 mL/min (ref >60)
Glucose, Bld: 119 mg/dL — ABNORMAL HIGH (ref 65–99)
Potassium: 3.8 mmol/L (ref 3.5–5.1)
SODIUM: 132 mmol/L — AB (ref 135–145)

## 2015-02-27 LAB — CBC
HEMATOCRIT: 31.6 % — AB (ref 36.0–46.0)
HEMOGLOBIN: 10.1 g/dL — AB (ref 12.0–15.0)
MCH: 31.7 pg (ref 26.0–34.0)
MCHC: 32 g/dL (ref 30.0–36.0)
MCV: 99.1 fL (ref 78.0–100.0)
Platelets: 365 10*3/uL (ref 150–400)
RBC: 3.19 MIL/uL — ABNORMAL LOW (ref 3.87–5.11)
RDW: 13.3 % (ref 11.5–15.5)
WBC: 6.9 10*3/uL (ref 4.0–10.5)

## 2015-02-27 NOTE — Anesthesia Postprocedure Evaluation (Signed)
  Anesthesia Post-op Note  Patient: Brandi [name]  Procedure(s) Performed: Procedure(s): TOTAL HIP ARTHROPLASTY ANTERIOR APPROACH (Left)  Patient Location: PACU  Anesthesia Type:MAC and Spinal  Level of Consciousness: awake  Airway and Oxygen Therapy: Patient Spontanous Breathing  Post-op Pain: mild  Post-op Assessment: Post-op Vital signs reviewed, Patient's Cardiovascular Status Stable, Respiratory Function Stable, Patent Airway, No signs of Nausea or vomiting and Pain level controlled LLE Motor Response: Purposeful movement, Responds to commands LLE Sensation: Decreased, Tingling, Other (Comment) (d/t spinal) RLE Motor Response: Purposeful movement, Responds to commands RLE Sensation: Decreased, Tingling, Other (Comment) (d/t spinal) L Sensory Level: S1-Sole of foot, small toes R Sensory Level: S1-Sole of foot, small toes  Post-op Vital Signs: Reviewed and stable  Last Vitals:  Filed Vitals:   02/27/15 1300  BP: 119/68  Pulse: 96  Temp: 36.8 C  Resp: 16    Complications: No apparent anesthesia complications

## 2015-02-27 NOTE — Evaluation (Signed)
Physical Therapy Evaluation Patient Details Name: Brandi [name] MRN: [id number] DOB: [birth year] Today's Date: 02/27/2015   History of Present Illness  Lt anterior THA  Clinical Impression  Pt is s/p THA anterior resulting in the deficits listed below (see PT Problem List). Pt will benefit from skilled PT to increase their independence and safety with mobility to allow discharge to the venue listed below. Patient with nausea and emesis after ambulation. Will continue to progress mobility as tolerated.      Follow Up Recommendations SNF;Supervision for mobility/OOB    Equipment Recommendations  Rolling walker with 5" wheels    Recommendations for Other Services       Precautions / Restrictions Precautions Precautions: Fall;Other (comment) (awaiting clarification of hip precautions) Precaution Comments: HEP handout provided Restrictions Weight Bearing Restrictions: Yes LLE Weight Bearing: Weight bearing as tolerated      Mobility  Bed Mobility Overal bed mobility: Needs Assistance Bed Mobility: Supine to Sit     Supine to sit: Min assist     General bed mobility comments: assist with LLE and cues for sequence  Transfers Overall transfer level: Needs assistance Equipment used: Rolling walker (2 wheeled) Transfers: Sit to/from Stand Sit to Stand: Min assist         General transfer comment: cues for hand placement and min assist to stand.   Ambulation/Gait Ambulation/Gait assistance: Min assist Ambulation Distance (Feet): 10 Feet Assistive device: Rolling walker (2 wheeled) Gait Pattern/deviations: Step-to pattern;Decreased step length - left;Decreased stance time - left;Decreased weight shift to left Gait velocity: decreased   General Gait Details: Needing physical assistance for move LLE forward for first 5 feet, cues for gait sequence. Encouraging independent movement of LLE with swing phase and gradual increase in weightbearing.   Stairs             Wheelchair Mobility    Modified Rankin (Stroke Patients Only)       Balance Overall balance assessment: Needs assistance Sitting-balance support: Bilateral upper extremity supported Sitting balance-Leahy Scale: Poor     Standing balance support: Bilateral upper extremity supported Standing balance-Leahy Scale: Poor Standing balance comment: using rw                             Pertinent Vitals/Pain Pain Assessment: 0-10 Pain Score: 10-Worst pain ever Pain Location: Lt hip Pain Descriptors / Indicators: Aching Pain Intervention(s): Monitored during session    Home Living Family/patient expects to be discharged to:: Skilled nursing facility Living Arrangements: Spouse/significant other   Type of Home: House Home Access: Other (comment) (multiple stairs to enter and within home)     Home Layout: Two level Home Equipment: None      Prior Function Level of Independence: Independent               Hand Dominance        Extremity/Trunk Assessment               Lower Extremity Assessment: LLE deficits/detail   LLE Deficits / Details: poor quad activation     Communication   Communication: No difficulties  Cognition Arousal/Alertness: Awake/alert Behavior During Therapy: WFL for tasks assessed/performed Overall Cognitive Status: Within Functional Limits for tasks assessed                      General Comments General comments (skin integrity, edema, etc.): Ambulation limited by patient's report of nausea, did have 1 episode with  emesis. Nursing notified. Patient reports feeling much better after.     Exercises Total Joint Exercises Ankle Circles/Pumps: AROM;Both;15 reps Quad Sets: Strengthening;Left;10 reps Gluteal Sets: Strengthening;Both;10 reps      Assessment/Plan    PT Assessment Patient needs continued PT services  PT Diagnosis Difficulty walking;Generalized weakness;Acute pain   PT Problem List Decreased  strength;Decreased range of motion;Decreased activity tolerance;Decreased balance;Decreased mobility;Decreased safety awareness;Decreased knowledge of precautions;Pain  PT Treatment Interventions DME instruction;Gait training;Stair training;Functional mobility training;Therapeutic activities;Therapeutic exercise;Balance training;Neuromuscular re-education;Patient/family education   PT Goals (Current goals can be found in the Care Plan section) Acute Rehab PT Goals Patient Stated Goal: go to SNF prior to going home PT Goal Formulation: With patient Time For Goal Achievement: 03/13/15 Potential to Achieve Goals: Good    Frequency 7X/week   Barriers to discharge        Co-evaluation               End of Session Equipment Utilized During Treatment: Gait belt Activity Tolerance: Other (comment) (limited by nausea) Patient left: in chair;with call bell/phone within reach Nurse Communication: Mobility status         Time: 0865-7846 PT Time Calculation (min) (ACUTE ONLY): 30 min   Charges:   PT Evaluation $Initial PT Evaluation Tier I: 1 Procedure PT Treatments $Gait Training: 8-22 mins   PT G Codes:        Cassell Clement, PT, CSCS Pager [phone] Office [phone]  02/27/2015, 10:05 AM

## 2015-02-27 NOTE — Evaluation (Signed)
Occupational Therapy Evaluation Patient Details Name: Brandi [name] MRN: [id number] DOB: 12/10/1945 Today's Date: 02/27/2015    History of Present Illness Lt anterior THA   Clinical Impression   Pt admitted with above diagnosis and presenting with deficits listed below. Pt reports she was independent in ADLs and functional mobility PTA. Pt currently experiencing significant pain (10/10) following ambulation by PT. Currently pt is min assist for functional transfers. Recommending SNF at this time, defer to SNF for continued skilled OT.    Follow Up Recommendations  SNF;Supervision/Assistance - 24 hour    Equipment Recommendations  Other (comment) (defer to SNF)    Recommendations for Other Services       Precautions / Restrictions Precautions Precautions: Fall;Other (comment) Precaution Comments: HEP handout provided Restrictions Weight Bearing Restrictions: Yes LLE Weight Bearing: Weight bearing as tolerated      Mobility Bed Mobility Overal bed mobility: Needs Assistance Bed Mobility: Sit to Supine     Supine to sit: Min assist Sit to supine: Min assist   General bed mobility comments: Min assist for LLE and verbal cues for sequencing  Transfers Overall transfer level: Needs assistance Equipment used: Rolling walker (2 wheeled) Transfers: Sit to/from Stand Sit to Stand: Min assist         General transfer comment: Verbal cues for hand placement and sequencing     Balance Overall balance assessment: Needs assistance Sitting-balance support: Bilateral upper extremity supported Sitting balance-Leahy Scale: Poor     Standing balance support: Bilateral upper extremity supported Standing balance-Leahy Scale: Poor Standing balance comment: using RW                            ADL Overall ADL's : Needs assistance/impaired Eating/Feeding: Set up;Sitting   Grooming: Minimal assistance;Standing   Upper Body Bathing: Min guard;Sitting   Lower  Body Bathing: Minimal assistance;Sit to/from stand   Upper Body Dressing : Min guard;Sitting   Lower Body Dressing: Minimal assistance;Sit to/from stand Lower Body Dressing Details (indicate cue type and reason): Pt unable to reach feet to don/doff socks Toilet Transfer: Minimal assistance;Ambulation;BSC;RW   Toileting- Clothing Manipulation and Hygiene: Minimal assistance;Sit to/from stand   Tub/ Shower Transfer: Minimal assistance   Functional mobility during ADLs: Minimal assistance (min assist for sit <> stand)       Vision     Perception     Praxis      Pertinent Vitals/Pain Pain Assessment: 0-10 Pain Score: 10-Worst pain ever Pain Location: L hip Pain Descriptors / Indicators: Sore;Aching Pain Intervention(s): Monitored during session;Limited activity within patient's tolerance     Hand Dominance     Extremity/Trunk Assessment Upper Extremity Assessment Upper Extremity Assessment: Overall WFL for tasks assessed   Lower Extremity Assessment Lower Extremity Assessment: Defer to PT evaluation LLE Deficits / Details: poor quad activation   Cervical / Trunk Assessment Cervical / Trunk Assessment: Normal   Communication Communication Communication: No difficulties   Cognition Arousal/Alertness: Awake/alert Behavior During Therapy: WFL for tasks assessed/performed Overall Cognitive Status: Within Functional Limits for tasks assessed                     General Comments       Exercises Exercises: Total Joint     Shoulder Instructions      Home Living Family/patient expects to be discharged to:: Skilled nursing facility Living Arrangements: Spouse/significant other   Type of Home: House Home Access: Other (comment)  Home Layout: Two level     Bathroom Shower/Tub: Teacher, early years/pre: Standard     Home Equipment: None          Prior Functioning/Environment Level of Independence: Independent             OT  Diagnosis: Generalized weakness;Acute pain   OT Problem List:     OT Treatment/Interventions:      OT Goals(Current goals can be found in the care plan section) Acute Rehab OT Goals Patient Stated Goal: go to SNF prior to going home OT Goal Formulation: With patient/family  OT Frequency:     Barriers to D/C:            Co-evaluation              End of Session Equipment Utilized During Treatment: Gait belt;Rolling walker  Activity Tolerance: Patient limited by pain Patient left: in bed;with call bell/phone within reach;with family/visitor present   Time: 1030-1046 OT Time Calculation (min): 16 min Charges:  OT General Charges $OT Visit: 1 Procedure OT Evaluation $Initial OT Evaluation Tier I: 1 Procedure G-Codes:    Binnie Kand M.S., OTR/L Pager: [phone]  02/27/2015, 11:11 AM

## 2015-02-27 NOTE — Progress Notes (Signed)
Patient ID: PHILLIS THACKERAY, female   DOB: March 13, 1946, 69 y.o.   MRN: [id number] PATIENT ID: SCHYLER BUTIKOFER  MRN: [id number]  DOB/AGE:  09-04-1945 / 69 y.o.  1 Day Post-Op Procedure(s) (LRB): TOTAL HIP ARTHROPLASTY ANTERIOR APPROACH (Left)    PROGRESS NOTE Subjective: Patient is alert, oriented, no Nausea, no Vomiting, yes passing gas, no Bowel Movement. Taking PO well. Denies SOB, Chest or Calf Pain. Using Incentive Spirometer, PAS in place. Ambulate WBAT Patient reports pain as 6 on 0-10 scale  .    Objective: Vital signs in last 24 hours: Filed Vitals:   02/26/15 1757 02/26/15 2049 02/27/15 0255 02/27/15 0534  BP: 154/84 167/84 139/79 123/63  Pulse: 88 94 100 84  Temp: 98.8 F (37.1 C) 99.4 F (37.4 C) 99.8 F (37.7 C) 100.2 F (37.9 C)  TempSrc: Oral     Resp: 16 18 18 18   Weight:      SpO2: 100% 100% 100% 100%      Intake/Output from previous day: I/O last 3 completed shifts: In: 1440 [P.O.:240; I.V.:1200] Out: 201 [Urine:1; Blood:200]   Intake/Output this shift:     LABORATORY DATA:  Recent Labs  02/26/15 0749 02/27/15 0538  WBC  --  6.9  HGB 15.0 10.1*  HCT 44.0 31.6*  PLT  --  365  NA 144 132*  K 3.4* 3.8  CL  --  101  CO2  --  26  BUN  --  5*  CREATININE  --  0.88  GLUCOSE 86 119*  CALCIUM  --  8.0*    Examination: Neurologically intact ABD soft Neurovascular intact Sensation intact distally Intact pulses distally Dorsiflexion/Plantar flexion intact Incision: no drainage No cellulitis present Compartment soft} XR AP&Lat of hip shows well placed\fixed THA  Assessment:   1 Day Post-Op Procedure(s) (LRB): TOTAL HIP ARTHROPLASTY ANTERIOR APPROACH (Left) ADDITIONAL DIAGNOSIS:  Expected Acute Blood Loss Anemia, GERD, Chronic pain  Plan: PT/OT WBAT, THA  posterior precautions  DVT Prophylaxis: SCDx72 hrs, ASA 325 mg BID x 2 weeks  DISCHARGE PLAN: Skilled Nursing Facility/Rehab, [place]  DISCHARGE NEEDS: HHPT, CPM and Environmental consultant

## 2015-02-28 LAB — CBC
HCT: 31.6 % — ABNORMAL LOW (ref 36.0–46.0)
HEMOGLOBIN: 10.1 g/dL — AB (ref 12.0–15.0)
MCH: 31.5 pg (ref 26.0–34.0)
MCHC: 32 g/dL (ref 30.0–36.0)
MCV: 98.4 fL (ref 78.0–100.0)
PLATELETS: 347 10*3/uL (ref 150–400)
RBC: 3.21 MIL/uL — AB (ref 3.87–5.11)
RDW: 13 % (ref 11.5–15.5)
WBC: 14.7 10*3/uL — AB (ref 4.0–10.5)

## 2015-02-28 NOTE — Clinical Social Work Placement (Signed)
   CLINICAL SOCIAL WORK PLACEMENT  NOTE  Date:  02/28/2015  Patient Details  Name: Brandi [name] MRN: [id number] Date of Birth: [birth year]  Clinical Social Work is seeking post-discharge placement for this patient at the [place] level of care (*CSW will initial, date and re-position this form in  chart as items are completed):  Yes   Patient/family provided with [place] Work Department's list of facilities offering this level of care within the geographic area requested by the patient (or if unable, by the patient's family).  Yes   Patient/family informed of their freedom to choose among providers that offer the needed level of care, that participate in Medicare, Medicaid or managed care program needed by the patient, have an available bed and are willing to accept the patient.  Yes   Patient/family informed of [place]'s ownership interest in [facility] and [facility], as well as of the fact that they are under no obligation to receive care at these facilities.  PASRR submitted to EDS on 02/28/15     PASRR number received on 02/28/15     Existing PASRR number confirmed on       FL2 transmitted to all facilities in geographic area requested by pt/family on 02/28/15     FL2 transmitted to all facilities within larger geographic area on       Patient informed that his/her managed care company has contracts with or will negotiate with certain facilities, including the following:        Yes   Patient/family informed of bed offers received.  Patient chooses bed at [facility]     Physician recommends and patient chooses bed at [facility] (Bundle from MD office)    Patient to be transferred to [facility] on 03/01/15.  Patient to be transferred to facility by [facility]     Patient family notified on 03/01/15 of transfer.  Name of family member notified:  Patient and spouse     PHYSICIAN       Additional Comment:     _______________________________________________ Dulcy Fanny, LCSW 02/28/2015, 10:44 AM

## 2015-02-28 NOTE — Care Management Note (Signed)
Case Management Note  Patient Details  Name: SHINITA MAC MRN: [id number] Date of Birth: 1946-04-17  Subjective/Objective:   69 y.o F admitted 9/12 for L THA AA. From home with spouse. PT/OT are recommending SNF, to which she is agreeable. Will continue to follow for final disposition. No CM needs at present.                   Action/Plan:Anticipate Discharge to SNF .    Expected Discharge Date:                  Expected Discharge Plan:  Skilled Nursing Facility  In-House Referral:  Clinical Social Work  Discharge planning Services  CM Consult  Post Acute Care Choice:    Choice offered to:  Patient, Spouse  DME Arranged:    DME Agency:     HH Arranged:    [place] Agency:     Status of Service:  In process, will continue to follow  Medicare Important Message Given:    Date Medicare IM Given:    Medicare IM give by:    Date Additional Medicare IM Given:    Additional Medicare Important Message give by:     If discussed at [place] of Stay Meetings, dates discussed:    Additional Comments:  Delrae Sawyers, RN 02/28/2015, 11:44 AM

## 2015-02-28 NOTE — Clinical Social Work Note (Signed)
Clinical Social Work Assessment  Patient Details  Name: Brandi [name] MRN: [id number] Date of Birth: [birth year]  Date of referral:  02/28/15               Reason for consult:  Facility Placement                Permission sought to share information with:    Permission granted to share information::  Yes, Verbal Permission Granted  Name::        Agency::   ([facility])  Relationship::     Contact Information:     Housing/Transportation Living arrangements for the past 2 months:  Single Family Home Source of Information:  Patient Patient Interpreter Needed:  None Criminal Activity/Legal Involvement Pertinent to Current Situation/Hospitalization:  No - Comment as needed Significant Relationships:  Spouse Lives with:  Spouse Do you feel safe going back to the place where you live?  No (fall risk, lack of supervision/support) Need for family participation in patient care:     Care giving concerns:  Lack of supervision and available supports at home   Social Worker assessment / plan:  CSW assessed patient at bedside.  Patient states she is in "quite a bit of pain", but is agreeable to completing assessment.  Patient states she is from home with husband.  Patient has been set-up to receive STR at [facility] at time of discharge.  Patient is a bundle.  Patient fractured her pubic ramus.  This fracture is non-operable.   Employment status:  Retired Forensic scientist:  [employer] PT Recommendations:  [place] / Referral to community resources:  [place]  Patient/Family's Response to care:  Both husband and patient are agreeable  Patient/Family's Understanding of and Emotional Response to Diagnosis, Current Treatment, and Prognosis:  Both husband and patient are realistic regarding patient's needed therapies at time of discharge and made appropriate arrangements.    Emotional Assessment Appearance:  Appears stated  age Attitude/Demeanor/Rapport:   (appropriate) Affect (typically observed):  Accepting, Appropriate, Adaptable Orientation:  Oriented to Self, Oriented to  Time, Oriented to Place, Oriented to Situation Alcohol / Substance use:  Never Used Psych involvement (Current and /or in the community):  No (Comment)  Discharge Needs  Concerns to be addressed:  No discharge needs identified Readmission within the last 30 days:    Current discharge risk:  None Barriers to Discharge:  No Barriers Identified   Dulcy Fanny, LCSW 02/28/2015, 10:41 AM

## 2015-02-28 NOTE — Progress Notes (Signed)
Physical Therapy Treatment Patient Details Name: Brandi [name] MRN: [id number] DOB: 02/24/1946 Today's Date: 02/28/2015    History of Present Illness Lt anterior THA    PT Comments    Patient able to increase her ambulation distance but repeated cues needed throughout for gait sequence. Reviewing HEP in addition to ambulation with handout provided. Anticipating D/C to SNF tomorrow.   Follow Up Recommendations  SNF;Supervision for mobility/OOB     Equipment Recommendations  Rolling walker with 5" wheels    Recommendations for Other Services       Precautions / Restrictions Precautions Precautions: Fall;Other (comment);Anterior Hip (reviewed and posted) Precaution Booklet Issued: Yes (comment) Precaution Comments: HEP handout provided Restrictions Weight Bearing Restrictions: Yes LLE Weight Bearing: Weight bearing as tolerated    Mobility  Bed Mobility Overal bed mobility: Needs Assistance Bed Mobility: Supine to Sit     Supine to sit: Supervision;HOB elevated     General bed mobility comments: cues for mobility sequence  Transfers Overall transfer level: Needs assistance Equipment used: Rolling walker (2 wheeled) Transfers: Sit to/from Stand Sit to Stand: Min guard         General transfer comment: Verbal cues for hand placement and sequencing   Ambulation/Gait Ambulation/Gait assistance: Min guard Ambulation Distance (Feet): 50 Feet Assistive device: Rolling walker (2 wheeled) Gait Pattern/deviations: Step-through pattern;Decreased step length - left;Decreased stance time - left;Decreased weight shift to left Gait velocity: decreased   General Gait Details: reminder for gait sequence needed on 3 occasions during ambulation   Stairs            Wheelchair Mobility    Modified Rankin (Stroke Patients Only)       Balance Overall balance assessment: Needs assistance Sitting-balance support: No upper extremity supported Sitting balance-Leahy  Scale: Fair     Standing balance support: Bilateral upper extremity supported Standing balance-Leahy Scale: Poor Standing balance comment: using rw                    Cognition Arousal/Alertness: Awake/alert Behavior During Therapy: WFL for tasks assessed/performed Overall Cognitive Status: Within Functional Limits for tasks assessed (mild confusion at times)                      Exercises Total Joint Exercises Ankle Circles/Pumps: AROM;Both;15 reps Quad Sets: Strengthening;Left;10 reps Short Arc Quad: Strengthening;Left;10 reps Heel Slides: AAROM;Left;10 reps    General Comments        Pertinent Vitals/Pain Pain Assessment: 0-10 Pain Score: 9  Pain Location: Lt hip Pain Descriptors / Indicators: Sharp Pain Intervention(s): Monitored during session;Limited activity within patient's tolerance    Home Living                      Prior Function            PT Goals (current goals can now be found in the care plan section) Acute Rehab PT Goals Patient Stated Goal: go to SNF prior to going home PT Goal Formulation: With patient Time For Goal Achievement: 03/13/15 Potential to Achieve Goals: Good Progress towards PT goals: Progressing toward goals    Frequency  7X/week    PT Plan Current plan remains appropriate    Co-evaluation             End of Session Equipment Utilized During Treatment: Gait belt Activity Tolerance: Patient tolerated treatment well Patient left: in chair;with call bell/phone within reach     Time: 3790-2409 PT Time  Calculation (min) (ACUTE ONLY): 27 min  Charges:  $Gait Training: 8-22 mins $Therapeutic Exercise: 8-22 mins                    G Codes:      Cassell Clement, PT, CSCS Pager [phone] Office [phone]  02/28/2015, 10:30 AM

## 2015-02-28 NOTE — Progress Notes (Signed)
PATIENT ID: Brandi [name]  MRN: [id number]  DOB/AGE:  [birth year] / 69 y.o.  2 Days Post-Op Procedure(s) (LRB): TOTAL HIP ARTHROPLASTY ANTERIOR APPROACH (Left)    PROGRESS NOTE Subjective: Patient is alert, oriented, yes Nausea, yes Vomiting, yes passing gas, no Bowel Movement. Taking PO small bites. Denies SOB, Chest or Calf Pain. Using Incentive Spirometer, PAS in place. Ambulate WBAT with pt walking 10 ft with therapy Patient reports pain as 9 on 0-10 scale  .    Objective: Vital signs in last 24 hours: Filed Vitals:   02/27/15 0534 02/27/15 1300 02/27/15 1951 02/28/15 0539  BP: 123/63 119/68 140/70 118/64  Pulse: 84 96 99 81  Temp: 100.2 F (37.9 C) 98.2 F (36.8 C) 98.3 F (36.8 C) 98.1 F (36.7 C)  TempSrc:      Resp: 18 16 16 16   Weight:      SpO2: 100% 97% 98% 100%      Intake/Output from previous day: I/O last 3 completed shifts: In: 720 [P.O.:720] Out: -    Intake/Output this shift:     LABORATORY DATA:  Recent Labs  02/26/15 0749 02/27/15 0538 02/28/15 0446  WBC  --  6.9 14.7*  HGB 15.0 10.1* 10.1*  HCT 44.0 31.6* 31.6*  PLT  --  365 347  NA 144 132*  --   K 3.4* 3.8  --   CL  --  101  --   CO2  --  26  --   BUN  --  5*  --   CREATININE  --  0.88  --   GLUCOSE 86 119*  --   CALCIUM  --  8.0*  --     Examination: Neurologically intact Neurovascular intact Sensation intact distally Intact pulses distally Dorsiflexion/Plantar flexion intact Incision: dressing C/D/I No cellulitis present Compartment soft} XR AP&Lat of hip shows well placed\fixed THA  Assessment:   2 Days Post-Op Procedure(s) (LRB): TOTAL HIP ARTHROPLASTY ANTERIOR APPROACH (Left) ADDITIONAL DIAGNOSIS:  Expected Acute Blood Loss Anemia, GERD and Chronic Pain  Plan: PT/OT WBAT, THA   precautions  DVT Prophylaxis: SCDx72 hrs, ASA 325 mg BID x 2 weeks  DISCHARGE PLAN: Skilled Nursing Facility/Rehab, [facility]  DISCHARGE NEEDS: HHPT, Walker and 3-in-1 comode seat

## 2015-03-01 DIAGNOSIS — R2681 Unsteadiness on feet: Secondary | ICD-10-CM | POA: Diagnosis not present

## 2015-03-01 DIAGNOSIS — D72829 Elevated white blood cell count, unspecified: Secondary | ICD-10-CM | POA: Diagnosis not present

## 2015-03-01 DIAGNOSIS — G8929 Other chronic pain: Secondary | ICD-10-CM | POA: Diagnosis not present

## 2015-03-01 DIAGNOSIS — Z9889 Other specified postprocedural states: Secondary | ICD-10-CM | POA: Diagnosis not present

## 2015-03-01 DIAGNOSIS — F329 Major depressive disorder, single episode, unspecified: Secondary | ICD-10-CM | POA: Diagnosis not present

## 2015-03-01 DIAGNOSIS — R109 Unspecified abdominal pain: Secondary | ICD-10-CM | POA: Diagnosis not present

## 2015-03-01 DIAGNOSIS — M6281 Muscle weakness (generalized): Secondary | ICD-10-CM | POA: Diagnosis not present

## 2015-03-01 DIAGNOSIS — M1612 Unilateral primary osteoarthritis, left hip: Secondary | ICD-10-CM | POA: Diagnosis not present

## 2015-03-01 DIAGNOSIS — Z96642 Presence of left artificial hip joint: Secondary | ICD-10-CM | POA: Diagnosis not present

## 2015-03-01 DIAGNOSIS — Z471 Aftercare following joint replacement surgery: Secondary | ICD-10-CM | POA: Diagnosis not present

## 2015-03-01 DIAGNOSIS — M25552 Pain in left hip: Secondary | ICD-10-CM | POA: Diagnosis not present

## 2015-03-01 DIAGNOSIS — F411 Generalized anxiety disorder: Secondary | ICD-10-CM | POA: Diagnosis not present

## 2015-03-01 DIAGNOSIS — G4700 Insomnia, unspecified: Secondary | ICD-10-CM | POA: Diagnosis not present

## 2015-03-01 DIAGNOSIS — Z79890 Hormone replacement therapy (postmenopausal): Secondary | ICD-10-CM | POA: Diagnosis not present

## 2015-03-01 DIAGNOSIS — I10 Essential (primary) hypertension: Secondary | ICD-10-CM | POA: Diagnosis not present

## 2015-03-01 DIAGNOSIS — K5900 Constipation, unspecified: Secondary | ICD-10-CM | POA: Diagnosis not present

## 2015-03-01 DIAGNOSIS — F322 Major depressive disorder, single episode, severe without psychotic features: Secondary | ICD-10-CM | POA: Diagnosis not present

## 2015-03-01 DIAGNOSIS — K210 Gastro-esophageal reflux disease with esophagitis: Secondary | ICD-10-CM | POA: Diagnosis not present

## 2015-03-01 DIAGNOSIS — R51 Headache: Secondary | ICD-10-CM | POA: Diagnosis not present

## 2015-03-01 DIAGNOSIS — M1990 Unspecified osteoarthritis, unspecified site: Secondary | ICD-10-CM | POA: Diagnosis not present

## 2015-03-01 DIAGNOSIS — D62 Acute posthemorrhagic anemia: Secondary | ICD-10-CM | POA: Diagnosis not present

## 2015-03-01 DIAGNOSIS — F419 Anxiety disorder, unspecified: Secondary | ICD-10-CM | POA: Diagnosis not present

## 2015-03-01 LAB — CBC
HCT: 28.6 % — ABNORMAL LOW (ref 36.0–46.0)
HEMOGLOBIN: 9.3 g/dL — AB (ref 12.0–15.0)
MCH: 32.3 pg (ref 26.0–34.0)
MCHC: 32.5 g/dL (ref 30.0–36.0)
MCV: 99.3 fL (ref 78.0–100.0)
PLATELETS: 336 10*3/uL (ref 150–400)
RBC: 2.88 MIL/uL — AB (ref 3.87–5.11)
RDW: 13.4 % (ref 11.5–15.5)
WBC: 14.6 10*3/uL — AB (ref 4.0–10.5)

## 2015-03-01 NOTE — Discharge Planning (Signed)
Patient will discharge today per MD order. Patient will discharge to: [facility] SN[facility] RN to call report prior to transportation to: [phone] Transportation: husband (packet prepared and placed on the chart; patient may be transported at any time; SNF is prepared for admission)  CSW sent discharge summary to SNF for review.  Packet is complete.  RN, patient and family aware of discharge plans.  Nonnie Done, [place] [phone]  Psychiatric & Orthopedics (5N 1-16) Clinical Social Worker

## 2015-03-01 NOTE — Progress Notes (Signed)
PT Cancellation Note  Patient Details Name: Brandi [name] MRN: [id number] DOB: 1945/09/21   Cancelled Treatment:    Reason Eval/Treat Not Completed: Other (comment). Patient reporting that she has been nauseated and is going to be leaving soon to go to SNF. Requesting no PT at this time.    Cassell Clement, PT, CSCS Pager [phone] Office [phone]  03/01/2015, 11:46 AM

## 2015-03-01 NOTE — Care Management Important Message (Signed)
Important Message  Patient Details  Name: Brandi [name] MRN: [id number] Date of Birth: 1946/05/22   Medicare Important Message Given:  Yes-second notification given    Delorse Lek 03/01/2015, 8:44 AM

## 2015-03-01 NOTE — Clinical Social Work Placement (Signed)
   CLINICAL SOCIAL WORK PLACEMENT  NOTE  Date:  03/01/2015  Patient Details  Name: Brandi [name] MRN: [id number] Date of Birth: 29-Dec-1945  Clinical Social Work is seeking post-discharge placement for this patient at the [place] level of care (*CSW will initial, date and re-position this form in  chart as items are completed):  Yes   Patient/family provided with [place] Work Department's list of facilities offering this level of care within the geographic area requested by the patient (or if unable, by the patient's family).  Yes   Patient/family informed of their freedom to choose among providers that offer the needed level of care, that participate in Medicare, Medicaid or managed care program needed by the patient, have an available bed and are willing to accept the patient.  Yes   Patient/family informed of [facility]'s ownership interest in [facility] and [facility], as well as of the fact that they are under no obligation to receive care at these facilities.  PASRR submitted to EDS on 02/28/15     PASRR number received on 02/28/15     Existing PASRR number confirmed on       FL2 transmitted to all facilities in geographic area requested by pt/family on 02/28/15     FL2 transmitted to all facilities within larger geographic area on       Patient informed that his/her managed care company has contracts with or will negotiate with certain facilities, including the following:        Yes   Patient/family informed of bed offers received.  Patient chooses bed at [facility]     Physician recommends and patient chooses bed at [facility] ([place] from MD office)    Patient to be transferred to [facility] on 03/01/15.  Patient to be transferred to facility by husband     Patient family notified on 03/01/15 of transfer.  Name of family member notified:  Patient and spouse     PHYSICIAN Please prepare priority discharge  summary, including medications     Additional Comment:    _______________________________________________ Dulcy Fanny, LCSW 03/01/2015, 11:20 AM

## 2015-03-01 NOTE — Progress Notes (Signed)
Patient ID: DINIA JOYNT, female   DOB: December 05, 1945, 69 y.o.   MRN: [id number] PATIENT ID: ALANEA WOOLRIDGE  MRN: [id number]  DOB/AGE:  [birth year] / 69 y.o.  3 Days Post-Op Procedure(s) (LRB): TOTAL HIP ARTHROPLASTY ANTERIOR APPROACH (Left)    PROGRESS NOTE Subjective: Patient is alert, oriented, no Nausea, no Vomiting, yes passing gas, yes Bowel Movement. Taking PO well. Denies SOB, Chest or Calf Pain. Using Incentive Spirometer, PAS in place. Ambulate 50 ft independent Patient reports pain as 8 on 0-10 scale  .    Objective: Vital signs in last 24 hours: Filed Vitals:   02/28/15 0539 02/28/15 1300 02/28/15 2115 03/01/15 0452  BP: 118/64 144/78 138/63 152/58  Pulse: 81 76 61 62  Temp: 98.1 F (36.7 C)  98.2 F (36.8 C) 98.7 F (37.1 C)  TempSrc:   Oral Oral  Resp: 16 18 18 18   Weight:      SpO2: 100% 98% 100% 100%      Intake/Output from previous day: I/O last 3 completed shifts: In: 840 [P.O.:840] Out: 100 [Emesis/NG output:100]   Intake/Output this shift:     LABORATORY DATA:  Recent Labs  02/27/15 0538 02/28/15 0446 03/01/15 0529  WBC 6.9 14.7* 14.6*  HGB 10.1* 10.1* 9.3*  HCT 31.6* 31.6* 28.6*  PLT 365 347 336  NA 132*  --   --   K 3.8  --   --   CL 101  --   --   CO2 26  --   --   BUN 5*  --   --   CREATININE 0.88  --   --   GLUCOSE 119*  --   --   CALCIUM 8.0*  --   --     Examination: Neurologically intact ABD soft Neurovascular intact Sensation intact distally Intact pulses distally Dorsiflexion/Plantar flexion intact Incision: dressing C/D/I No cellulitis present Compartment soft} XR AP&Lat of hip shows well placed\fixed THA  Assessment:   3 Days Post-Op Procedure(s) (LRB): TOTAL HIP ARTHROPLASTY ANTERIOR APPROACH (Left) ADDITIONAL DIAGNOSIS:  Expected Acute Blood Loss Anemia, chronic Pain Plan: PT/OT WBAT, THA  posterior precautions  DVT Prophylaxis: SCDx72 hrs, ASA 325 mg BID x 2 weeks  DISCHARGE PLAN: Skilled Nursing Facility/Rehab,  [facility]  DISCHARGE NEEDS: HHPT, Walker and 3-in-1 comode seat

## 2015-03-01 NOTE — Discharge Summary (Signed)
Patient ID: Brandi [name] MRN: [id number] DOB/AGE: 67/29/1947 67 y.o.  Admit date: 02/26/2015 Discharge date: 03/01/2015  Admission Diagnoses:  Principal Problem:   Primary osteoarthritis of left hip Active Problems:   Arthritis, hip   Discharge Diagnoses:  Same  Past Medical History  Diagnosis Date  . Osteoarthritis   . Chronic back pain   . Constipation   . Insomnia     takes Restoril and Trazodone nightly as needed  . Depression     takes Lexapro daily  . Anxiety     takes Valium daily as needed  . GERD (gastroesophageal reflux disease)     takes Zantac daily  . PONV (postoperative nausea and vomiting)   . Joint pain   . Joint swelling   . Peripheral edema     occasionally but never been on fluid pill  . Chronic back pain   . History of gastric ulcer   . History of GI bleed   . History of blood transfusion     no abnormal reaction noted  . History of colon polyps     benign  . Nocturia     Surgeries: Procedure(s): TOTAL HIP ARTHROPLASTY ANTERIOR APPROACH on 02/26/2015   Consultants:    Discharged Condition: Improved  Hospital Course: Brandi [name] is an 69 y.o. female who was admitted 02/26/2015 for operative treatment ofPrimary osteoarthritis of left hip. Patient has severe unremitting pain that affects sleep, daily activities, and work/hobbies. After pre-op clearance the patient was taken to the operating room on 02/26/2015 and underwent  Procedure(s): TOTAL HIP ARTHROPLASTY ANTERIOR APPROACH.    Patient was given perioperative antibiotics: Anti-infectives    Start     Dose/Rate Route Frequency Ordered Stop   02/26/15 2200  penicillin v potassium (VEETID) tablet 500 mg     500 mg Oral 2 times daily 02/26/15 1742     02/26/15 1030  ceFAZolin (ANCEF) IVPB 2 g/50 mL premix     2 g 100 mL/hr over 30 Minutes Intravenous To [facility] 02/25/15 1451 02/26/15 1048       Patient was given sequential compression devices, early ambulation, and  chemoprophylaxis to prevent DVT.  Patient benefited maximally from hospital stay and there were no complications.    Recent vital signs: Patient Vitals for the past 24 hrs:  BP Temp Temp src Pulse Resp SpO2  03/01/15 0452 (!) 152/58 mmHg 98.7 F (37.1 C) Oral 62 18 100 %  02/28/15 2115 138/63 mmHg 98.2 F (36.8 C) Oral 61 18 100 %  02/28/15 1300 (!) 144/78 mmHg - - 76 18 98 %     Recent laboratory studies:  Recent Labs  02/27/15 0538 02/28/15 0446 03/01/15 0529  WBC 6.9 14.7* 14.6*  HGB 10.1* 10.1* 9.3*  HCT 31.6* 31.6* 28.6*  PLT 365 347 336  NA 132*  --   --   K 3.8  --   --   CL 101  --   --   CO2 26  --   --   BUN 5*  --   --   CREATININE 0.88  --   --   GLUCOSE 119*  --   --   CALCIUM 8.0*  --   --      Discharge Medications:     Medication List    STOP taking these medications        acetaminophen 650 MG CR tablet  Commonly known as:  TYLENOL      TAKE these medications  amoxicillin 500 MG capsule  Commonly known as:  AMOXIL  Take 500 mg by mouth daily.     aspirin EC 325 MG tablet  Take 1 tablet (325 mg total) by mouth 2 (two) times daily.     diazepam 10 MG tablet  Commonly known as:  VALIUM  Take 1 tablet (10 mg total) by mouth every 12 (twelve) hours as needed for anxiety.     diclofenac sodium 1 % Gel  Commonly known as:  VOLTAREN  APPLY 2 G TOPICALLY 4 (FOUR) TIMES DAILY.     escitalopram 20 MG tablet  Commonly known as:  LEXAPRO  Take 1 tablet (20 mg total) by mouth daily.     estrogen (conjugated)-medroxyprogesterone 0.3-1.5 MG per tablet  Commonly known as:  PREMPRO  Take 1 tablet by mouth daily.     multivitamin with minerals tablet  Take 1 tablet by mouth daily.     oxyCODONE-acetaminophen 10-325 MG per tablet  Commonly known as:  PERCOCET  Take 1 tablet by mouth every 4 (four) hours as needed for pain.     penicillin v potassium 500 MG tablet  Commonly known as:  VEETID  Take 500 mg by mouth 2 (two) times daily.      potassium chloride SA 20 MEQ tablet  Commonly known as:  K-DUR,KLOR-CON  Take 1 tablet (20 mEq total) by mouth daily.     ranitidine 150 MG tablet  Commonly known as:  ZANTAC  Take 1 tablet (150 mg total) by mouth 2 (two) times daily.     temazepam 30 MG capsule  Commonly known as:  RESTORIL  1 po qhs prn sleep     tizanidine 2 MG capsule  Commonly known as:  ZANAFLEX  Take 1 capsule (2 mg total) by mouth 3 (three) times daily.     traZODone 50 MG tablet  Commonly known as:  DESYREL  1 po qhs prn        Diagnostic Studies: Dg Chest 2 View  02/15/2015   CLINICAL DATA:  Preoperative exam prior total hip arthroplasty  EXAM: CHEST  2 VIEW  COMPARISON:  None in PACs  FINDINGS: The lungs are hyperinflated with hemidiaphragm flattening. There is no focal infiltrate. There is no pleural effusion. The heart and pulmonary vascularity are normal. The mediastinum is normal in width. The bony structures are unremarkable.  IMPRESSION: There is no active cardiopulmonary disease. Hyperinflation may be voluntary or may reflect underlying reactive airway disease.   Electronically Signed   By: David  Martinique M.D.   On: 02/15/2015 15:01   Dg Hip Operative Unilat With Pelvis Left  02/26/2015   CLINICAL DATA:  Left anterior total hip arthroplasty  EXAM: OPERATIVE LEFT HIP (WITH PELVIS IF PERFORMED) 1 VIEWS  TECHNIQUE: Fluoroscopic spot image(s) were submitted for interpretation post-operatively.  COMPARISON:  None.  FINDINGS: Single AP intraoperative spot images demonstrate changes of left hip replacement. Normal AP alignment. No visible complicating feature.  IMPRESSION: Left hip replacement.  No visible complicating feature.   Electronically Signed   By: Rolm Baptise M.D.   On: 02/26/2015 12:24    Disposition: [facility]      Discharge Instructions    Call MD / Call 911    Complete by:  As directed   If you experience chest pain or shortness of breath, CALL 911 and be transported to the  hospital emergency room.  If you develope a fever above 101 F, pus (white drainage) or increased drainage  or redness at the wound, or calf pain, call your surgeon's office.     Constipation Prevention    Complete by:  As directed   Drink plenty of fluids.  Prune juice may be helpful.  You may use a stool softener, such as Colace (over the counter) 100 mg twice a day.  Use MiraLax (over the counter) for constipation as needed.     Diet - low sodium heart healthy    Complete by:  As directed      Follow the hip precautions as taught in Physical Therapy    Complete by:  As directed      Increase activity slowly as tolerated    Complete by:  As directed            Follow-up Information    Follow up with Kerin Salen, MD In 2 weeks.   Specialty:  Orthopedic Surgery   Contact information:   [place] [id number] [phone]        Signed: Kerin Salen 03/01/2015, 8:06 AM

## 2015-03-02 ENCOUNTER — Non-Acute Institutional Stay (SKILLED_NURSING_FACILITY): Payer: Medicare Other | Admitting: Adult Health

## 2015-03-02 ENCOUNTER — Encounter: Payer: Self-pay | Admitting: Adult Health

## 2015-03-02 DIAGNOSIS — G4700 Insomnia, unspecified: Secondary | ICD-10-CM | POA: Diagnosis not present

## 2015-03-02 DIAGNOSIS — Z9889 Other specified postprocedural states: Secondary | ICD-10-CM

## 2015-03-02 DIAGNOSIS — K2100 Gastro-esophageal reflux disease with esophagitis, without bleeding: Secondary | ICD-10-CM

## 2015-03-02 DIAGNOSIS — Z79890 Hormone replacement therapy (postmenopausal): Secondary | ICD-10-CM | POA: Diagnosis not present

## 2015-03-02 DIAGNOSIS — F419 Anxiety disorder, unspecified: Secondary | ICD-10-CM | POA: Diagnosis not present

## 2015-03-02 DIAGNOSIS — M1612 Unilateral primary osteoarthritis, left hip: Secondary | ICD-10-CM

## 2015-03-05 ENCOUNTER — Non-Acute Institutional Stay (SKILLED_NURSING_FACILITY): Payer: Medicare Other | Admitting: Internal Medicine

## 2015-03-05 DIAGNOSIS — R51 Headache: Secondary | ICD-10-CM | POA: Diagnosis not present

## 2015-03-05 DIAGNOSIS — K2100 Gastro-esophageal reflux disease with esophagitis, without bleeding: Secondary | ICD-10-CM

## 2015-03-05 DIAGNOSIS — M25552 Pain in left hip: Secondary | ICD-10-CM | POA: Diagnosis not present

## 2015-03-05 DIAGNOSIS — M1612 Unilateral primary osteoarthritis, left hip: Secondary | ICD-10-CM | POA: Diagnosis not present

## 2015-03-05 DIAGNOSIS — F329 Major depressive disorder, single episode, unspecified: Secondary | ICD-10-CM

## 2015-03-05 DIAGNOSIS — R2681 Unsteadiness on feet: Secondary | ICD-10-CM

## 2015-03-05 DIAGNOSIS — D72829 Elevated white blood cell count, unspecified: Secondary | ICD-10-CM

## 2015-03-05 DIAGNOSIS — F411 Generalized anxiety disorder: Secondary | ICD-10-CM | POA: Diagnosis not present

## 2015-03-05 DIAGNOSIS — I10 Essential (primary) hypertension: Secondary | ICD-10-CM | POA: Diagnosis not present

## 2015-03-05 DIAGNOSIS — K5900 Constipation, unspecified: Secondary | ICD-10-CM

## 2015-03-05 DIAGNOSIS — D62 Acute posthemorrhagic anemia: Secondary | ICD-10-CM

## 2015-03-05 DIAGNOSIS — F322 Major depressive disorder, single episode, severe without psychotic features: Secondary | ICD-10-CM

## 2015-03-05 DIAGNOSIS — R519 Headache, unspecified: Secondary | ICD-10-CM

## 2015-03-05 DIAGNOSIS — M6281 Muscle weakness (generalized): Secondary | ICD-10-CM | POA: Diagnosis not present

## 2015-03-05 DIAGNOSIS — Z96642 Presence of left artificial hip joint: Secondary | ICD-10-CM | POA: Diagnosis not present

## 2015-03-05 NOTE — Progress Notes (Signed)
Patient ID: Brandi [name], female   DOB: [birth year], 69 y.o.   MRN: [id number]     [facility]   PCP: Garnet Koyanagi, DO  Code Status: full code  Allergies  Allergen Reactions  . Aspirin     Stomach ulcer, now inactive, OK with low dose ASA    Chief Complaint  Patient presents with  . New Admit To SNF     HPI:  69 y.o. patient is here for short term rehabilitation post hospital admission from 02/26/15-03/01/15 with left hip OA. She underwent left total hip arthroplasty. She is seen in her room today. She was having bad headache this am with nausea and vomiting but now has subsided. Was not able to keep her breakfast down. She has been constipated and has not had a bowel movement since surgery. At home she has one to two bowel movement daily.   Review of Systems:  Constitutional: Negative for fever, chills, diaphoresis.  HENT: Negative for headache, congestion, nasal discharge Eyes: Negative for eye pain, blurred vision, double vision and discharge.  Respiratory: Negative for cough, shortness of breath and wheezing.   Cardiovascular: Negative for chest pain, palpitations, leg swelling.  Gastrointestinal: Negative for abdominal pain. Positive for constipation, passing flatus. Positive for heartburn.  Genitourinary: Negative for dysuria, flank pain.  Musculoskeletal: Negative for back pain, falls Skin: Negative for itching, rash.  Neurological: Negative for dizziness, tingling, focal weakness Psychiatric/Behavioral: Negative for depression   Past Medical History  Diagnosis Date  . Osteoarthritis   . Chronic back pain   . Constipation   . Insomnia     takes Restoril and Trazodone nightly as needed  . Depression     takes Lexapro daily  . Anxiety     takes Valium daily as needed  . GERD (gastroesophageal reflux disease)     takes Zantac daily  . PONV (postoperative nausea and vomiting)   . Joint pain   . Joint swelling   . Peripheral  edema     occasionally but never been on fluid pill  . Chronic back pain   . History of gastric ulcer   . History of GI bleed   . History of blood transfusion     no abnormal reaction noted  . History of colon polyps     benign  . Nocturia    Past Surgical History  Procedure Laterality Date  . Foot surgery Right   . Lumbar fusion    . Shoulder surgery Left   . Esophagogastroduodenoscopy N/A 06/16/2014    Procedure: ESOPHAGOGASTRODUODENOSCOPY (EGD);  Surgeon: Missy Sabins, MD;  [facility] ENDOSCOPY;  Service: Endoscopy;  Laterality: N/A;  . Colonoscopy    . Wisdom teeth extracted    . Total hip arthroplasty Left 02/26/2015    Procedure: TOTAL HIP ARTHROPLASTY ANTERIOR APPROACH;  Surgeon: Frederik Pear, MD;  Location: [facility];  Service: Orthopedics;  Laterality: Left;   Social History:   reports that she has never smoked. She has never used smokeless tobacco. She reports that she does not drink alcohol or use illicit drugs.  Family History  Problem Relation Age of Onset  . Cancer Mother     colon cancer  . Cancer Father     colon cancer  . Heart disease Neg Hx   . Cancer Sister 61    died of colon cancer    Medications:   Medication List       This list is accurate as  of: 03/05/15  3:40 PM.  Always use your most recent med list.               amoxicillin 500 MG capsule  Commonly known as:  AMOXIL  Take 500 mg by mouth daily.     aspirin EC 325 MG tablet  Take 1 tablet (325 mg total) by mouth 2 (two) times daily.     diazepam 10 MG tablet  Commonly known as:  VALIUM  Take 1 tablet (10 mg total) by mouth every 12 (twelve) hours as needed for anxiety.     diclofenac sodium 1 % Gel  Commonly known as:  VOLTAREN  APPLY 2 G TOPICALLY 4 (FOUR) TIMES DAILY.     escitalopram 20 MG tablet  Commonly known as:  LEXAPRO  Take 1 tablet (20 mg total) by mouth daily.     estrogen (conjugated)-medroxyprogesterone 0.3-1.5 MG per tablet  Commonly known as:  PREMPRO  Take 1  tablet by mouth daily.     multivitamin with minerals tablet  Take 1 tablet by mouth daily.     oxyCODONE-acetaminophen 10-325 MG per tablet  Commonly known as:  PERCOCET  Take 1 tablet by mouth every 4 (four) hours as needed for pain.     penicillin v potassium 500 MG tablet  Commonly known as:  VEETID  Take 500 mg by mouth 2 (two) times daily.     potassium chloride SA 20 MEQ tablet  Commonly known as:  K-DUR,KLOR-CON  Take 1 tablet (20 mEq total) by mouth daily.     ranitidine 150 MG tablet  Commonly known as:  ZANTAC  Take 1 tablet (150 mg total) by mouth 2 (two) times daily.     temazepam 30 MG capsule  Commonly known as:  RESTORIL  1 po qhs prn sleep     tizanidine 2 MG capsule  Commonly known as:  ZANAFLEX  Take 1 capsule (2 mg total) by mouth 3 (three) times daily.     traZODone 50 MG tablet  Commonly known as:  DESYREL  1 po qhs prn         Physical Exam: Filed Vitals:   03/05/15 1540  BP: 156/81  Pulse: 80  Temp: 99.1 F (37.3 C)  Resp: 18  SpO2: 96%    General- elderly female, well built, in no acute distress Head- normocephalic, atraumatic, no temporal tenderness Nose- normal nasal mucosa, no maxillary or frontal sinus tenderness, no nasal discharge Throat- moist mucus membrane  Eyes- PERRLA, EOMI, no pallor, no icterus, no discharge, normal conjunctiva, normal sclera Neck- no cervical lymphadenopathy, no jugular vein distension Cardiovascular- normal s1,s2, no murmurs, palpable dorsalis pedis and radial pulses, trace left leg edema Respiratory- bilateral clear to auscultation, no wheeze, no rhonchi, no crackles, no use of accessory muscles Abdomen- bowel sounds present, soft, non tender, no guarding or rigidity, no CVA tenderness Musculoskeletal- able to move all 4 extremities, limited left leg range of motion, generalized weakness Neurological- no focal deficit, alert and oriented to person, place and time Skin- warm and dry, left hip surgical  incision with aquacel dressing Psychiatry- normal mood and affect    Labs reviewed: Basic Metabolic Panel:  Recent Labs  06/16/14 1127  06/29/14 1703 02/15/15 1210 02/26/15 0749 02/27/15 0538  NA  --   < > 138 139 144 132*  K  --   < > 3.2* 2.9* 3.4* 3.8  CL  --   < > 99 105  --  101  CO2  --   < > 30 25  --  26  GLUCOSE  --   < > 98 87 86 119*  BUN  --   < > 10 8  --  5*  CREATININE  --   < > 0.96 1.08*  --  0.88  CALCIUM  --   < > 9.9 9.2  --  8.0*  MG 2.2  --   --   --   --   --   < > = values in this interval not displayed. Liver Function Tests:  Recent Labs  03/07/14 1405 06/15/14 1342 06/29/14 1703  AST 15 24 17   ALT 13 14 11   ALKPHOS 66 73 73  BILITOT 0.5 0.3 0.5  PROT 8.4* 7.2 8.1  ALBUMIN 4.3 4.0 4.0   No results for input(s): LIPASE, AMYLASE in the last 8760 hours. No results for input(s): AMMONIA in the last 8760 hours. CBC:  Recent Labs  06/29/14 1703 11/24/14 1700 02/15/15 1210  02/27/15 0538 02/28/15 0446 03/01/15 0529  WBC 15.1* 11.9* 9.2  --  6.9 14.7* 14.6*  NEUTROABS 12.3* 8.1* 7.1  --   --   --   --   HGB 13.6 11.0* 13.2  < > 10.1* 10.1* 9.3*  HCT 41.7 34.1* 39.9  < > 31.6* 31.6* 28.6*  MCV 100.0 99.4 96.8  --  99.1 98.4 99.3  PLT 436.0* 458* 301  --  365 347 336  < > = values in this interval not displayed. Cardiac Enzymes: No results for input(s): CKTOTAL, CKMB, CKMBINDEX, TROPONINI in the last 8760 hours. BNP: Invalid input(s): POCBNP CBG:  Recent Labs  06/18/14 0737 06/18/14 1159 06/19/14 0739  GLUCAP 108* 72 101*    Radiological Exams: Dg Chest 2 View  02/15/2015   CLINICAL DATA:  Preoperative exam prior total hip arthroplasty  EXAM: CHEST  2 VIEW  COMPARISON:  None in PACs  FINDINGS: The lungs are hyperinflated with hemidiaphragm flattening. There is no focal infiltrate. There is no pleural effusion. The heart and pulmonary vascularity are normal. The mediastinum is normal in width. The bony structures are unremarkable.   IMPRESSION: There is no active cardiopulmonary disease. Hyperinflation may be voluntary or may reflect underlying reactive airway disease.   Electronically Signed   By: David  Martinique M.D.   On: 02/15/2015 15:01    Assessment/Plan  Unsteady gait Post hip surgical repair. Will have her work with physical therapy and occupational therapy team to help with gait training and muscle strengthening exercises.fall precautions. Skin care. Encourage to be out of bed.   Left hip OA S/p left hip arthroplasty. Continue percocet 10-325 mg q4h prn pain with zanaflex 2 mg tid for muscle spasm. Continue aspirin 325 mg bid for dvt prophylaxis. Has f/u with orthopedics. To work with therapy team for gait training. Continue veetid 500 mg bid with recent hip surgery, no stop date for antibiotics, to continue antibiotics until seen by orthopedics as per patient. Has orthopedic appointment on 03/08/15  Blood loss anemia Post surgery, monitor h&h  Leukocytosis No signs of infection, likely reactive leukocytosis. Monitor clinically  Hypertension Elevated bp reading. Currently not on any medication. Start lisinopril 5 mg po daily and monitor bp q shift and reassess.  Headache Concern of migraine, has positive family history, has subsided at present, if recurs again will need further evaluation  Constipation Start miralax 17 g bid with senna s 2 tab daily for now and reassess. No signs of bowel obstruction  at present. Monitor clinically  Tooth extraction Continue amoxicillin 500 mg daily for now until f/u with dentist  Chronic depression Stable, continue lexapro 20 mg daily  Hypokalemia Continue kcl 20 meq daily  GAD Continue valium 10 mg bid prn  gerd start zantac 150 mg bid  Goals of care: short term rehabilitation   Labs/tests ordered: cbc with diff, cmp next lab  Family/ staff Communication: reviewed care plan with patient and nursing supervisor    Blanchie Serve, MD  [facility]  [facility] [phone] (Monday-Friday 8 am - 5 pm) [phone] (afterhours)

## 2015-03-06 ENCOUNTER — Non-Acute Institutional Stay (SKILLED_NURSING_FACILITY): Payer: Medicare Other | Admitting: Adult Health

## 2015-03-06 ENCOUNTER — Encounter: Payer: Self-pay | Admitting: Adult Health

## 2015-03-06 DIAGNOSIS — G4700 Insomnia, unspecified: Secondary | ICD-10-CM

## 2015-03-06 DIAGNOSIS — F322 Major depressive disorder, single episode, severe without psychotic features: Secondary | ICD-10-CM | POA: Diagnosis not present

## 2015-03-06 DIAGNOSIS — I10 Essential (primary) hypertension: Secondary | ICD-10-CM

## 2015-03-06 DIAGNOSIS — D62 Acute posthemorrhagic anemia: Secondary | ICD-10-CM | POA: Diagnosis not present

## 2015-03-06 DIAGNOSIS — Z9889 Other specified postprocedural states: Secondary | ICD-10-CM

## 2015-03-06 DIAGNOSIS — F419 Anxiety disorder, unspecified: Secondary | ICD-10-CM

## 2015-03-06 DIAGNOSIS — Z79890 Hormone replacement therapy (postmenopausal): Secondary | ICD-10-CM

## 2015-03-06 DIAGNOSIS — M1612 Unilateral primary osteoarthritis, left hip: Secondary | ICD-10-CM

## 2015-03-06 DIAGNOSIS — K2100 Gastro-esophageal reflux disease with esophagitis, without bleeding: Secondary | ICD-10-CM

## 2015-03-06 DIAGNOSIS — F329 Major depressive disorder, single episode, unspecified: Secondary | ICD-10-CM

## 2015-03-06 DIAGNOSIS — K5900 Constipation, unspecified: Secondary | ICD-10-CM

## 2015-03-06 NOTE — Progress Notes (Signed)
Patient ID: Brandi [name], female   DOB: 02/19/1946, 69 y.o.   MRN: [id number]    DATE:  03/06/15 MRN:  [id number]  BIRTHDAY: 01-15-1946  Facility:  Nursing Home Location:  [place] Room Number: 907-P  LEVEL OF CARE:  SNF (31)  Contact Information    Name Relation Home Work [place] Spouse [phone]  [phone]      Chief Complaint  Patient presents with  . Discharge Note    Osteoarthritis S/P left total hip arthroplasty, hypertension, constipation, HRT, anxiety, GERD, depression, hypokalemia, insomnia and history of oral surgery    HISTORY OF PRESENT ILLNESS:  This is a 69 year old female who is for discharge home and will have 5 visits from PT. DME:  Standard rolling walker and bedside commode. She has been admitted to [facility] on 03/01/15 from [facility]. She has PMH of chronic back pain, insomnia, depression, anxiety, GERD and peripheral edema. She has osteoarthritis for which she had left total hip arthroplasty on 02/26/15.  Patient was admitted to this facility for short-term rehabilitation after the patient's recent hospitalization.  Patient has completed SNF rehabilitation and therapy has cleared the patient for discharge.   PAST MEDICAL HISTORY:  Past Medical History  Diagnosis Date  . Osteoarthritis   . Chronic back pain   . Constipation   . Insomnia     takes Restoril and Trazodone nightly as needed  . Depression     takes Lexapro daily  . Anxiety     takes Valium daily as needed  . GERD (gastroesophageal reflux disease)     takes Zantac daily  . PONV (postoperative nausea and vomiting)   . Joint pain   . Joint swelling   . Peripheral edema     occasionally but never been on fluid pill  . Chronic back pain   . History of gastric ulcer   . History of GI bleed   . History of blood transfusion     no abnormal reaction noted  . History of colon polyps     benign  . Nocturia      CURRENT  MEDICATIONS: Reviewed  Patient's Medications  New Prescriptions   No medications on file  Previous Medications   ACETAMINOPHEN (TYLENOL) 500 MG TABLET    Take 500 mg by mouth every 6 (six) hours as needed.   AMOXICILLIN (AMOXIL) 500 MG CAPSULE    Take 500 mg by mouth daily.   ASPIRIN EC 325 MG TABLET    Take 1 tablet (325 mg total) by mouth 2 (two) times daily.   DIAZEPAM (VALIUM) 10 MG TABLET    Take 1 tablet (10 mg total) by mouth every 12 (twelve) hours as needed for anxiety.   DICLOFENAC SODIUM (VOLTAREN) 1 % GEL    APPLY 2 G TOPICALLY 4 (FOUR) TIMES DAILY.   ESCITALOPRAM (LEXAPRO) 20 MG TABLET    Take 1 tablet (20 mg total) by mouth daily.   ESTROGEN, CONJUGATED,-MEDROXYPROGESTERONE (PREMPRO) 0.3-1.5 MG PER TABLET    Take 1 tablet by mouth daily.   MULTIPLE VITAMINS-MINERALS (MULTIVITAMIN WITH MINERALS) TABLET    Take 1 tablet by mouth daily.   OXYCODONE-ACETAMINOPHEN (PERCOCET) 10-325 MG PER TABLET    Take 1 tablet by mouth every 4 (four) hours as needed for pain. 0.5 - 1 tab PO Q 4 hours PRN   PENICILLIN V POTASSIUM (VEETID) 500 MG TABLET    Take 500 mg by mouth 2 (two)  times daily.   POTASSIUM CHLORIDE SA (K-DUR,KLOR-CON) 20 MEQ TABLET    Take 1 tablet (20 mEq total) by mouth daily.   RANITIDINE (ZANTAC) 150 MG TABLET    Take 1 tablet (150 mg total) by mouth 2 (two) times daily.   TEMAZEPAM (RESTORIL) 30 MG CAPSULE    1 po qhs prn sleep   TIZANIDINE (ZANAFLEX) 2 MG CAPSULE    Take 1 capsule (2 mg total) by mouth 3 (three) times daily.   TRAZODONE (DESYREL) 50 MG TABLET    1 po qhs prn  Modified Medications   No medications on file  Discontinued Medications   OXYCODONE-ACETAMINOPHEN (PERCOCET) 10-325 MG PER TABLET    Take 1 tablet by mouth every 4 (four) hours as needed for pain.     Allergies  Allergen Reactions  . Aspirin     Stomach ulcer, now inactive, OK with low dose ASA     REVIEW OF SYSTEMS:  GENERAL: no change in appetite, no fatigue, no weight changes, no fever,  chills or weakness EYES: Denies change in vision, dry eyes, eye pain, itching or discharge EARS: Denies change in hearing, ringing in ears, or earache NOSE: Denies nasal congestion or epistaxis MOUTH and THROAT: Denies oral discomfort, gingival pain or bleeding, pain from teeth or hoarseness   RESPIRATORY: no cough, SOB, DOE, wheezing, hemoptysis CARDIAC: no chest pain, edema or palpitations GI: no abdominal pain, diarrhea, heart burn, nausea or vomiting, +constipation GU: Denies dysuria, frequency, hematuria, incontinence, or discharge PSYCHIATRIC: Denies feeling of depression or anxiety. No report of hallucinations, insomnia, paranoia, or agitation    PHYSICAL EXAMINATION  GENERAL APPEARANCE: Well nourished. In no acute distress. Normal body habitus SKIN:  Left hip surgical site has Aquacel dressing, dry and no erythema HEAD: Normal in size and contour. No evidence of trauma EYES: Lids open and close normally. No blepharitis, entropion or ectropion. PERRL. Conjunctivae are clear and sclerae are white. Lenses are without opacity EARS: Pinnae are normal. Patient hears normal voice tunes of the examiner MOUTH and THROAT: Lips are without lesions. Oral mucosa is moist and without lesions. Tongue is normal in shape, size, and color and without lesions, no redness nor bleeding NECK: supple, trachea midline, no neck masses, no thyroid tenderness, no thyromegaly LYMPHATICS: no LAN in the neck, no supraclavicular LAN RESPIRATORY: breathing is even & unlabored, BS CTAB CARDIAC: RRR, no murmur,no extra heart sounds, no edema GI: abdomen soft, normal BS, no masses, no tenderness, no hepatomegaly, no splenomegaly EXTREMITIES:  Able to move 4 extremities PSYCHIATRIC: Alert and oriented X 3. Affect and behavior are appropriate  LABS/RADIOLOGY: Labs reviewed: Basic Metabolic Panel:  Recent Labs  06/16/14 1127  06/29/14 1703 02/15/15 1210 02/26/15 0749 02/27/15 0538  NA  --   < > 138 139  144 132*  K  --   < > 3.2* 2.9* 3.4* 3.8  CL  --   < > 99 105  --  101  CO2  --   < > 30 25  --  26  GLUCOSE  --   < > 98 87 86 119*  BUN  --   < > 10 8  --  5*  CREATININE  --   < > 0.96 1.08*  --  0.88  CALCIUM  --   < > 9.9 9.2  --  8.0*  MG 2.2  --   --   --   --   --   < > = values in this  interval not displayed. Liver Function Tests:  Recent Labs  03/07/14 1405 06/15/14 1342 06/29/14 1703  AST 15 24 17   ALT 13 14 11   ALKPHOS 66 73 73  BILITOT 0.5 0.3 0.5  PROT 8.4* 7.2 8.1  ALBUMIN 4.3 4.0 4.0   CBC:  Recent Labs  06/29/14 1703 11/24/14 1700 02/15/15 1210  02/27/15 0538 02/28/15 0446 03/01/15 0529  WBC 15.1* 11.9* 9.2  --  6.9 14.7* 14.6*  NEUTROABS 12.3* 8.1* 7.1  --   --   --   --   HGB 13.6 11.0* 13.2  < > 10.1* 10.1* 9.3*  HCT 41.7 34.1* 39.9  < > 31.6* 31.6* 28.6*  MCV 100.0 99.4 96.8  --  99.1 98.4 99.3  PLT 436.0* 458* 301  --  365 347 336  < > = values in this interval not displayed.  CBG:  Recent Labs  06/18/14 0737 06/18/14 1159 06/19/14 0739  GLUCAP 108* 72 101*    Dg Chest 2 View  02/15/2015   CLINICAL DATA:  Preoperative exam prior total hip arthroplasty  EXAM: CHEST  2 VIEW  COMPARISON:  None in PACs  FINDINGS: The lungs are hyperinflated with hemidiaphragm flattening. There is no focal infiltrate. There is no pleural effusion. The heart and pulmonary vascularity are normal. The mediastinum is normal in width. The bony structures are unremarkable.  IMPRESSION: There is no active cardiopulmonary disease. Hyperinflation may be voluntary or may reflect underlying reactive airway disease.   Electronically Signed   By: David  Martinique M.D.   On: 02/15/2015 15:01   Dg Hip Operative Unilat With Pelvis Left  02/26/2015   CLINICAL DATA:  Left anterior total hip arthroplasty  EXAM: OPERATIVE LEFT HIP (WITH PELVIS IF PERFORMED) 1 VIEWS  TECHNIQUE: Fluoroscopic spot image(s) were submitted for interpretation post-operatively.  COMPARISON:  None.  FINDINGS:  Single AP intraoperative spot images demonstrate changes of left hip replacement. Normal AP alignment. No visible complicating feature.  IMPRESSION: Left hip replacement.  No visible complicating feature.   Electronically Signed   By: Rolm Baptise M.D.   On: 02/26/2015 12:24    ASSESSMENT/PLAN:  Osteoarthritis S/P left total hip arthroplasty -  Will have 5 visits of Home health PT;  aspirin EC 325 mg 1 tab by mouth twice a day for DVT prophylaxis; Percocet 10/325 mg 1/2 - 1 tab by mouth every 4 hours when necessary for pain; Zanaflex 2 mg 1 capsule by mouth 3 times a day for muscle spasm; and follow-up with Dr. Mayer Camel, orthopedic surgeon, on 9/21  Anxiety - mood is stable; continue diazepam 10 mg 1 tab by mouth every 12 hours when necessary  History of oral surgery - 100 canal prior to hip arthroplasty, continue amoxicillin 500 mg 1 capsule by mouth daily  GERD - continue Zantac 150 mg 1 tab by mouth twice a day  Insomnia - continue Restoril 30 mg 1 capsule by mouth daily at bedtime when necessary and trazodone 50 mg 1 tab by mouth daily at bedtime when necessary  HRT - continue Prempro 0.3-1.5 mg 1 tab by mouth daily  Anemia, acute blood loss - hemoglobin 9.3; awaiting result of CBC  Hypokalemia - K3.8; awaiting result of CMP  Constipation - continue MiraLAX 17 g by mouth twice a day 2 more days then daily, senna S2 tabs by mouth daily and magnesium citrate 296 ML by mouth 1  Depression - mood is stable; continue Lexapro 20 mg 1 tab by mouth daily  I have filled out patient's discharge paperwork and written prescriptions.  Patient will have 5 visits from  PT.  DME provided:  Standard rolling walker and bedside commode  Total discharge time: Greater than 30 minutes  Discharge time involved coordination of the discharge process with social worker, nursing staff and therapy department. Medical justification for DME verified.     [facility], NP [employer] [phone]

## 2015-03-06 NOTE — Progress Notes (Signed)
Patient ID: Brandi [name], female   DOB: 10-10-1945, 69 y.o.   MRN: [id number]    DATE:  03/02/15 MRN:  [id number]  BIRTHDAY: 26-Feb-1946  Facility:  Nursing Home Location:  [place] Room Number: 907-P  LEVEL OF CARE:  SNF (31)  Contact Information    Name Relation Home Work [place] Spouse [phone]  [phone]      Chief Complaint  Patient presents with  . Hospitalization Follow-up    Osteoarthritis S/P left total hip arthroplasty, HRT, anxiety, GERD, insomnia and history of oral surgery    HISTORY OF PRESENT ILLNESS:  This is a 69 year old female who has been admitted to [facility] on 03/01/15 from [facility]. She has PMH of chronic back pain, insomnia, depression, anxiety, GERD and peripheral edema. She has osteoarthritis for which she had left total hip arthroplasty on 02/26/15.  She has been admitted for a short-term rehabilitation.  PAST MEDICAL HISTORY:  Past Medical History  Diagnosis Date  . Osteoarthritis   . Chronic back pain   . Constipation   . Insomnia     takes Restoril and Trazodone nightly as needed  . Depression     takes Lexapro daily  . Anxiety     takes Valium daily as needed  . GERD (gastroesophageal reflux disease)     takes Zantac daily  . PONV (postoperative nausea and vomiting)   . Joint pain   . Joint swelling   . Peripheral edema     occasionally but never been on fluid pill  . Chronic back pain   . History of gastric ulcer   . History of GI bleed   . History of blood transfusion     no abnormal reaction noted  . History of colon polyps     benign  . Nocturia      CURRENT MEDICATIONS: Reviewed  Patient's Medications  New Prescriptions   No medications on file  Previous Medications   AMOXICILLIN (AMOXIL) 500 MG CAPSULE    Take 500 mg by mouth daily.   ASPIRIN EC 325 MG TABLET    Take 1 tablet (325 mg total) by mouth 2 (two) times daily.   DIAZEPAM (VALIUM) 10 MG  TABLET    Take 1 tablet (10 mg total) by mouth every 12 (twelve) hours as needed for anxiety.   DICLOFENAC SODIUM (VOLTAREN) 1 % GEL    APPLY 2 G TOPICALLY 4 (FOUR) TIMES DAILY.   ESCITALOPRAM (LEXAPRO) 20 MG TABLET    Take 1 tablet (20 mg total) by mouth daily.   ESTROGEN, CONJUGATED,-MEDROXYPROGESTERONE (PREMPRO) 0.3-1.5 MG PER TABLET    Take 1 tablet by mouth daily.   MULTIPLE VITAMINS-MINERALS (MULTIVITAMIN WITH MINERALS) TABLET    Take 1 tablet by mouth daily.   OXYCODONE-ACETAMINOPHEN (PERCOCET) 10-325 MG PER TABLET    Take 1 tablet by mouth every 4 (four) hours as needed for pain.   PENICILLIN V POTASSIUM (VEETID) 500 MG TABLET    Take 500 mg by mouth 2 (two) times daily.   POTASSIUM CHLORIDE SA (K-DUR,KLOR-CON) 20 MEQ TABLET    Take 1 tablet (20 mEq total) by mouth daily.   RANITIDINE (ZANTAC) 150 MG TABLET    Take 1 tablet (150 mg total) by mouth 2 (two) times daily.   TEMAZEPAM (RESTORIL) 30 MG CAPSULE    1 po qhs prn sleep   TIZANIDINE (ZANAFLEX) 2 MG CAPSULE    Take 1 capsule (2 mg  total) by mouth 3 (three) times daily.   TRAZODONE (DESYREL) 50 MG TABLET    1 po qhs prn  Modified Medications   No medications on file  Discontinued Medications   No medications on file     Allergies  Allergen Reactions  . Aspirin     Stomach ulcer, now inactive, OK with low dose ASA     REVIEW OF SYSTEMS:  GENERAL: no change in appetite, no fatigue, no weight changes, no fever, chills or weakness EYES: Denies change in vision, dry eyes, eye pain, itching or discharge EARS: Denies change in hearing, ringing in ears, or earache NOSE: Denies nasal congestion or epistaxis MOUTH and THROAT: Denies oral discomfort, gingival pain or bleeding, pain from teeth or hoarseness   RESPIRATORY: no cough, SOB, DOE, wheezing, hemoptysis CARDIAC: no chest pain, edema or palpitations GI: no abdominal pain, diarrhea, constipation, heart burn, nausea or vomiting GU: Denies dysuria, frequency, hematuria,  incontinence, or discharge PSYCHIATRIC: Denies feeling of depression or anxiety. No report of hallucinations, insomnia, paranoia, or agitation    PHYSICAL EXAMINATION  GENERAL APPEARANCE: Well nourished. In no acute distress. Normal body habitus SKIN:  Left hip surgical site has Aquacel dressing, dry and no erythema HEAD: Normal in size and contour. No evidence of trauma EYES: Lids open and close normally. No blepharitis, entropion or ectropion. PERRL. Conjunctivae are clear and sclerae are white. Lenses are without opacity EARS: Pinnae are normal. Patient hears normal voice tunes of the examiner MOUTH and THROAT: Lips are without lesions. Oral mucosa is moist and without lesions. Tongue is normal in shape, size, and color and without lesions, no redness nor bleeding NECK: supple, trachea midline, no neck masses, no thyroid tenderness, no thyromegaly LYMPHATICS: no LAN in the neck, no supraclavicular LAN RESPIRATORY: breathing is even & unlabored, BS CTAB CARDIAC: RRR, no murmur,no extra heart sounds, no edema GI: abdomen soft, normal BS, no masses, no tenderness, no hepatomegaly, no splenomegaly EXTREMITIES:  Able to move 4 extremities PSYCHIATRIC: Alert and oriented X 3. Affect and behavior are appropriate  LABS/RADIOLOGY: Labs reviewed: Basic Metabolic Panel:  Recent Labs  06/16/14 1127  06/29/14 1703 02/15/15 1210 02/26/15 0749 02/27/15 0538  NA  --   < > 138 139 144 132*  K  --   < > 3.2* 2.9* 3.4* 3.8  CL  --   < > 99 105  --  101  CO2  --   < > 30 25  --  26  GLUCOSE  --   < > 98 87 86 119*  BUN  --   < > 10 8  --  5*  CREATININE  --   < > 0.96 1.08*  --  0.88  CALCIUM  --   < > 9.9 9.2  --  8.0*  MG 2.2  --   --   --   --   --   < > = values in this interval not displayed. Liver Function Tests:  Recent Labs  03/07/14 1405 06/15/14 1342 06/29/14 1703  AST 15 24 17   ALT 13 14 11   ALKPHOS 66 73 73  BILITOT 0.5 0.3 0.5  PROT 8.4* 7.2 8.1  ALBUMIN 4.3 4.0 4.0    CBC:  Recent Labs  06/29/14 1703 11/24/14 1700 02/15/15 1210  02/27/15 0538 02/28/15 0446 03/01/15 0529  WBC 15.1* 11.9* 9.2  --  6.9 14.7* 14.6*  NEUTROABS 12.3* 8.1* 7.1  --   --   --   --  HGB 13.6 11.0* 13.2  < > 10.1* 10.1* 9.3*  HCT 41.7 34.1* 39.9  < > 31.6* 31.6* 28.6*  MCV 100.0 99.4 96.8  --  99.1 98.4 99.3  PLT 436.0* 458* 301  --  365 347 336  < > = values in this interval not displayed.  CBG:  Recent Labs  06/18/14 0737 06/18/14 1159 06/19/14 0739  GLUCAP 108* 72 101*    Dg Chest 2 View  02/15/2015   CLINICAL DATA:  Preoperative exam prior total hip arthroplasty  EXAM: CHEST  2 VIEW  COMPARISON:  None in PACs  FINDINGS: The lungs are hyperinflated with hemidiaphragm flattening. There is no focal infiltrate. There is no pleural effusion. The heart and pulmonary vascularity are normal. The mediastinum is normal in width. The bony structures are unremarkable.  IMPRESSION: There is no active cardiopulmonary disease. Hyperinflation may be voluntary or may reflect underlying reactive airway disease.   Electronically Signed   By: David  Martinique M.D.   On: 02/15/2015 15:01   Dg Hip Operative Unilat With Pelvis Left  02/26/2015   CLINICAL DATA:  Left anterior total hip arthroplasty  EXAM: OPERATIVE LEFT HIP (WITH PELVIS IF PERFORMED) 1 VIEWS  TECHNIQUE: Fluoroscopic spot image(s) were submitted for interpretation post-operatively.  COMPARISON:  None.  FINDINGS: Single AP intraoperative spot images demonstrate changes of left hip replacement. Normal AP alignment. No visible complicating feature.  IMPRESSION: Left hip replacement.  No visible complicating feature.   Electronically Signed   By: Rolm Baptise M.D.   On: 02/26/2015 12:24    ASSESSMENT/PLAN:  Osteoarthritis S/P left total hip arthroplasty - for rehabilitation; continue penicillin V potassium 500 mg 1 tab by mouth twice a day; aspirin EC 325 mg 1 tab by mouth twice a day for DVT prophylaxis; Percocet 10/325 mg 1  tab by mouth every 4 hours when necessary for pain; Zanaflex 2 mg 1 capsule by mouth 3 times a day for muscle spasm; and follow-up with Dr. Mayer Camel, orthopedic surgeon, in 2 weeks  Anxiety - mood is stable; continue diazepam 10 mg 1 tab by mouth every 12 hours when necessary  History of oral surgery - 100 canal prior to hip arthroplasty, continue amoxicillin 500 mg 1 capsule by mouth daily  GERD - continue Zantac 150 mg 1 tab by mouth twice a day  Insomnia - continue Restoril 30 mg 1 capsule by mouth daily at bedtime when necessary and trazodone 50 mg 1 tab by mouth daily at bedtime when necessary  HRT - continue Prempro 0.3-1.5 mg 1 tab by mouth daily     Goals of care:  Short-term rehabilitation    [facility], NP [facility] [phone]

## 2015-03-08 DIAGNOSIS — Z471 Aftercare following joint replacement surgery: Secondary | ICD-10-CM | POA: Diagnosis not present

## 2015-03-08 DIAGNOSIS — Z96642 Presence of left artificial hip joint: Secondary | ICD-10-CM | POA: Diagnosis not present

## 2015-04-05 DIAGNOSIS — Z471 Aftercare following joint replacement surgery: Secondary | ICD-10-CM | POA: Diagnosis not present

## 2015-04-05 DIAGNOSIS — M25552 Pain in left hip: Secondary | ICD-10-CM | POA: Diagnosis not present

## 2015-04-10 DIAGNOSIS — M25552 Pain in left hip: Secondary | ICD-10-CM | POA: Diagnosis not present

## 2015-04-10 DIAGNOSIS — R262 Difficulty in walking, not elsewhere classified: Secondary | ICD-10-CM | POA: Diagnosis not present

## 2015-04-10 DIAGNOSIS — M6281 Muscle weakness (generalized): Secondary | ICD-10-CM | POA: Diagnosis not present

## 2015-04-10 DIAGNOSIS — Z96642 Presence of left artificial hip joint: Secondary | ICD-10-CM | POA: Diagnosis not present

## 2015-04-17 ENCOUNTER — Telehealth: Payer: Self-pay | Admitting: Family Medicine

## 2015-04-17 DIAGNOSIS — R262 Difficulty in walking, not elsewhere classified: Secondary | ICD-10-CM | POA: Diagnosis not present

## 2015-04-17 DIAGNOSIS — M6281 Muscle weakness (generalized): Secondary | ICD-10-CM | POA: Diagnosis not present

## 2015-04-17 DIAGNOSIS — M25552 Pain in left hip: Secondary | ICD-10-CM | POA: Diagnosis not present

## 2015-04-17 DIAGNOSIS — Z96642 Presence of left artificial hip joint: Secondary | ICD-10-CM | POA: Diagnosis not present

## 2015-04-17 NOTE — Telephone Encounter (Signed)
Patient has been made aware she needed an OV, she verbalized understanding. Apt scheduled for thursday at 9:30.   KP

## 2015-04-17 NOTE — Telephone Encounter (Signed)
Caller name:Orra Shingledecker Relationship to patient: Can be reached:[phone] Pharmacy:  Reason for call:Had hip replacement 2 weeks ago. Pt is very tired, can't eat. Was told by orth dr to have lab levels checked by PCP. Please advise

## 2015-04-19 ENCOUNTER — Ambulatory Visit (INDEPENDENT_AMBULATORY_CARE_PROVIDER_SITE_OTHER): Payer: Medicare Other | Admitting: Family Medicine

## 2015-04-19 ENCOUNTER — Encounter: Payer: Self-pay | Admitting: Family Medicine

## 2015-04-19 VITALS — BP 132/78 | HR 79 | Temp 97.7°F | Ht 65.0 in | Wt 137.6 lb

## 2015-04-19 DIAGNOSIS — R82998 Other abnormal findings in urine: Secondary | ICD-10-CM

## 2015-04-19 DIAGNOSIS — R5383 Other fatigue: Secondary | ICD-10-CM | POA: Diagnosis not present

## 2015-04-19 DIAGNOSIS — N390 Urinary tract infection, site not specified: Secondary | ICD-10-CM | POA: Diagnosis not present

## 2015-04-19 DIAGNOSIS — R300 Dysuria: Secondary | ICD-10-CM

## 2015-04-19 LAB — POCT URINALYSIS DIPSTICK
BILIRUBIN UA: NEGATIVE
Blood, UA: NEGATIVE
Glucose, UA: NEGATIVE
KETONES UA: NEGATIVE
Nitrite, UA: NEGATIVE
PROTEIN UA: NEGATIVE
Spec Grav, UA: 1.01
Urobilinogen, UA: 2
pH, UA: 6.5

## 2015-04-19 LAB — CBC WITH DIFFERENTIAL/PLATELET
BASOS PCT: 0.5 % (ref 0.0–3.0)
Basophils Absolute: 0 10*3/uL (ref 0.0–0.1)
EOS ABS: 0.3 10*3/uL (ref 0.0–0.7)
Eosinophils Relative: 4.7 % (ref 0.0–5.0)
HEMATOCRIT: 40 % (ref 36.0–46.0)
Hemoglobin: 12.9 g/dL (ref 12.0–15.0)
LYMPHS ABS: 1.5 10*3/uL (ref 0.7–4.0)
LYMPHS PCT: 23.1 % (ref 12.0–46.0)
MCHC: 32.1 g/dL (ref 30.0–36.0)
MCV: 98.2 fl (ref 78.0–100.0)
MONOS PCT: 9.7 % (ref 3.0–12.0)
Monocytes Absolute: 0.6 10*3/uL (ref 0.1–1.0)
NEUTROS ABS: 4.1 10*3/uL (ref 1.4–7.7)
NEUTROS PCT: 62 % (ref 43.0–77.0)
PLATELETS: 494 10*3/uL — AB (ref 150.0–400.0)
RBC: 4.07 Mil/uL (ref 3.87–5.11)
RDW: 15 % (ref 11.5–15.5)
WBC: 6.6 10*3/uL (ref 4.0–10.5)

## 2015-04-19 LAB — BASIC METABOLIC PANEL
BUN: 5 mg/dL — ABNORMAL LOW (ref 6–23)
CHLORIDE: 103 meq/L (ref 96–112)
CO2: 31 meq/L (ref 19–32)
CREATININE: 0.97 mg/dL (ref 0.40–1.20)
Calcium: 9.8 mg/dL (ref 8.4–10.5)
GFR: 73.56 mL/min (ref >60.00)
GLUCOSE: 89 mg/dL (ref 70–99)
Potassium: 3.3 mEq/L — ABNORMAL LOW (ref 3.5–5.1)
Sodium: 143 mEq/L (ref 135–145)

## 2015-04-19 MED ORDER — CIPROFLOXACIN HCL 250 MG PO TABS: 250.0000 mg | ORAL_TABLET | Freq: Two times a day (BID) | ORAL | Status: DC

## 2015-04-19 NOTE — Progress Notes (Signed)
Pre visit review using our clinic review tool, if applicable. No additional management support is needed unless otherwise documented below in the visit note. 

## 2015-04-19 NOTE — Patient Instructions (Addendum)

## 2015-04-20 LAB — URINE CULTURE
Colony Count: NO GROWTH
ORGANISM ID, BACTERIA: NO GROWTH

## 2015-04-22 DIAGNOSIS — R5383 Other fatigue: Secondary | ICD-10-CM | POA: Insufficient documentation

## 2015-04-22 NOTE — Progress Notes (Signed)
Patient ID: Brandi [name], female    DOB: 1945/10/10  Age: 69 y.o. MRN: [id number]    Subjective:  Subjective HPI Brandi [name] presents for c/o fatigue since surgery. She was under anesthesia for 4 hours per pt.   Hip is healing well.   Review of Systems  Constitutional: Negative for diaphoresis, appetite change, fatigue and unexpected weight change.  Eyes: Negative for pain, redness and visual disturbance.  Respiratory: Negative for cough, chest tightness, shortness of breath and wheezing.   Cardiovascular: Negative for chest pain, palpitations and leg swelling.  Endocrine: Negative for cold intolerance, heat intolerance, polydipsia, polyphagia and polyuria.  Genitourinary: Negative for dysuria, frequency and difficulty urinating.  Neurological: Negative for dizziness, light-headedness, numbness and headaches.    History Past Medical History  Diagnosis Date  . Osteoarthritis   . Chronic back pain   . Constipation   . Insomnia     takes Restoril and Trazodone nightly as needed  . Depression     takes Lexapro daily  . Anxiety     takes Valium daily as needed  . GERD (gastroesophageal reflux disease)     takes Zantac daily  . PONV (postoperative nausea and vomiting)   . Joint pain   . Joint swelling   . Peripheral edema     occasionally but never been on fluid pill  . Chronic back pain   . History of gastric ulcer   . History of GI bleed   . History of blood transfusion     no abnormal reaction noted  . History of colon polyps     benign  . Nocturia     She has past surgical history that includes Foot surgery (Right); Lumbar fusion; Shoulder surgery (Left); Esophagogastroduodenoscopy (N/A, 06/16/2014); Colonoscopy; wisdom teeth extracted; and Total hip arthroplasty (Left, 02/26/2015).   Her family history includes Cancer in her father and mother; Cancer (age of onset: 55) in her sister. There is no history of Heart disease.She reports that she has never smoked. She has  never used smokeless tobacco. She reports that she does not drink alcohol or use illicit drugs.  Current Outpatient Prescriptions on File Prior to Visit  Medication Sig Dispense Refill  . acetaminophen (TYLENOL) 500 MG tablet Take 500 mg by mouth every 6 (six) hours as needed.    Marland Kitchen aspirin EC 325 MG tablet Take 1 tablet (325 mg total) by mouth 2 (two) times daily. 30 tablet 0  . diazepam (VALIUM) 10 MG tablet Take 1 tablet (10 mg total) by mouth every 12 (twelve) hours as needed for anxiety. 45 tablet 2  . diclofenac sodium (VOLTAREN) 1 % GEL APPLY 2 G TOPICALLY 4 (FOUR) TIMES DAILY. 100 g 3  . escitalopram (LEXAPRO) 20 MG tablet Take 1 tablet (20 mg total) by mouth daily. 30 tablet 5  . estrogen, conjugated,-medroxyprogesterone (PREMPRO) 0.3-1.5 MG per tablet Take 1 tablet by mouth daily. 30 tablet 5  . Multiple Vitamins-Minerals (MULTIVITAMIN WITH MINERALS) tablet Take 1 tablet by mouth daily.    Marland Kitchen oxyCODONE-acetaminophen (PERCOCET) 10-325 MG per tablet Take 1 tablet by mouth every 4 (four) hours as needed for pain. 0.5 - 1 tab PO Q 4 hours PRN    . potassium chloride SA (K-DUR,KLOR-CON) 20 MEQ tablet Take 1 tablet (20 mEq total) by mouth daily. 30 tablet 3  . ranitidine (ZANTAC) 150 MG tablet Take 1 tablet (150 mg total) by mouth 2 (two) times daily. 180 tablet 3  . temazepam (RESTORIL) 30 MG  capsule 1 po qhs prn sleep (Patient taking differently: Take 30 mg by mouth at bedtime as needed. 1 po qhs prn sleep) 30 capsule 0  . tizanidine (ZANAFLEX) 2 MG capsule Take 1 capsule (2 mg total) by mouth 3 (three) times daily. 60 capsule 0  . traZODone (DESYREL) 50 MG tablet 1 po qhs prn 30 tablet 1  . amoxicillin (AMOXIL) 500 MG capsule Take 500 mg by mouth daily.    . penicillin v potassium (VEETID) 500 MG tablet Take 500 mg by mouth 2 (two) times daily.     No current facility-administered medications on file prior to visit.     Objective:  Objective Physical Exam  Constitutional: She is  oriented to person, place, and time. She appears well-developed and well-nourished.  HENT:  Head: Normocephalic and atraumatic.  Eyes: Conjunctivae and EOM are normal.  Neck: Normal range of motion. Neck supple. No JVD present. Carotid bruit is not present. No thyromegaly present.  Cardiovascular: Normal rate, regular rhythm and normal heart sounds.   No murmur heard. Pulmonary/Chest: Effort normal and breath sounds normal. No respiratory distress. She has no wheezes. She has no rales. She exhibits no tenderness.  Musculoskeletal: She exhibits tenderness. She exhibits no edema.  Neurological: She is alert and oriented to person, place, and time.  Walking with walker since surgery In physical therapy  Psychiatric: She has a normal mood and affect.  Nursing note and vitals reviewed.  BP 132/78 mmHg  Pulse 79  Temp(Src) 97.7 F (36.5 C) (Oral)  Ht 5\' 5"  (1.651 m)  Wt 137 lb 9.6 oz (62.415 kg)  BMI 22.90 kg/m2  SpO2 96% Wt Readings from Last 3 Encounters:  04/19/15 137 lb 9.6 oz (62.415 kg)  03/06/15 152 lb 12.8 oz (69.31 kg)  03/02/15 142 lb (64.411 kg)     Lab Results  Component Value Date   WBC 6.6 04/19/2015   HGB 12.9 04/19/2015   HCT 40.0 04/19/2015   PLT 494.0* 04/19/2015   GLUCOSE 89 04/19/2015   ALT 11 06/29/2014   AST 17 06/29/2014   NA 143 04/19/2015   K 3.3* 04/19/2015   CL 103 04/19/2015   CREATININE 0.97 04/19/2015   BUN 5* 04/19/2015   CO2 31 04/19/2015   TSH 0.625 06/14/2013   INR 1.12 02/15/2015    Dg Chest 2 View  02/15/2015  CLINICAL DATA:  Preoperative exam prior total hip arthroplasty EXAM: CHEST  2 VIEW COMPARISON:  None in PACs FINDINGS: The lungs are hyperinflated with hemidiaphragm flattening. There is no focal infiltrate. There is no pleural effusion. The heart and pulmonary vascularity are normal. The mediastinum is normal in width. The bony structures are unremarkable. IMPRESSION: There is no active cardiopulmonary disease. Hyperinflation may  be voluntary or may reflect underlying reactive airway disease. Electronically Signed   By: David  Martinique M.D.   On: 02/15/2015 15:01     Assessment & Plan:  Plan I am having [name] start on ciprofloxacin. I am also having her maintain her ranitidine, escitalopram, temazepam, diazepam, estrogen (conjugated)-medroxyprogesterone, diclofenac sodium, multivitamin with minerals, traZODone, potassium chloride SA, penicillin v potassium, amoxicillin, tizanidine, aspirin EC, acetaminophen, and oxyCODONE-acetaminophen.  Meds ordered this encounter  Medications  . ciprofloxacin (CIPRO) 250 MG tablet    Sig: Take 1 tablet (250 mg total) by mouth 2 (two) times daily.    Dispense:  6 tablet    Refill:  0    Problem List Items Addressed This Visit    None  Visit Diagnoses    Dysuria    -  Primary    Relevant Medications    ciprofloxacin (CIPRO) 250 MG tablet    Other Relevant Orders    POCT Urinalysis Dipstick (Completed)    Urine Culture (Completed)    Leukocytes in urine        Relevant Orders    Urine Culture (Completed)    Other fatigue        Relevant Orders    Basic metabolic panel (Completed)    CBC with Differential/Platelet (Completed)       Follow-up: Return in about 3 months (around 07/20/2015), or if symptoms worsen or fail to improve.  Garnet Koyanagi, DO

## 2015-04-22 NOTE — Assessment & Plan Note (Signed)
Discussed with pt that it may be from anesthesia and that it can take months to improve We will check labs

## 2015-04-26 DIAGNOSIS — Z96642 Presence of left artificial hip joint: Secondary | ICD-10-CM | POA: Diagnosis not present

## 2015-04-26 DIAGNOSIS — R262 Difficulty in walking, not elsewhere classified: Secondary | ICD-10-CM | POA: Diagnosis not present

## 2015-04-26 DIAGNOSIS — M6281 Muscle weakness (generalized): Secondary | ICD-10-CM | POA: Diagnosis not present

## 2015-04-26 DIAGNOSIS — M25552 Pain in left hip: Secondary | ICD-10-CM | POA: Diagnosis not present

## 2015-04-27 DIAGNOSIS — E876 Hypokalemia: Secondary | ICD-10-CM

## 2015-04-27 MED ORDER — POTASSIUM CHLORIDE CRYS ER 20 MEQ PO TBCR: 40.0000 meq | EXTENDED_RELEASE_TABLET | Freq: Every day | ORAL | Status: DC

## 2015-05-24 DIAGNOSIS — M545 Low back pain: Secondary | ICD-10-CM | POA: Diagnosis not present

## 2015-05-24 DIAGNOSIS — M2012 Hallux valgus (acquired), left foot: Secondary | ICD-10-CM | POA: Diagnosis not present

## 2015-05-24 DIAGNOSIS — R0781 Pleurodynia: Secondary | ICD-10-CM | POA: Diagnosis not present

## 2015-05-24 DIAGNOSIS — M351 Other overlap syndromes: Secondary | ICD-10-CM | POA: Diagnosis not present

## 2015-05-30 DIAGNOSIS — M2012 Hallux valgus (acquired), left foot: Secondary | ICD-10-CM | POA: Diagnosis not present

## 2015-05-30 DIAGNOSIS — M1611 Unilateral primary osteoarthritis, right hip: Secondary | ICD-10-CM | POA: Diagnosis not present

## 2015-06-28 DIAGNOSIS — M2012 Hallux valgus (acquired), left foot: Secondary | ICD-10-CM | POA: Diagnosis not present

## 2015-06-28 DIAGNOSIS — M79671 Pain in right foot: Secondary | ICD-10-CM | POA: Diagnosis not present

## 2015-07-02 ENCOUNTER — Ambulatory Visit: Payer: Medicare Other | Admitting: Family Medicine

## 2015-07-04 ENCOUNTER — Telehealth: Payer: Self-pay

## 2015-07-04 NOTE — Telephone Encounter (Signed)
-----   Message from Quintin Alto sent at 07/02/2015  2:56 PM EST ----- Regarding: Appointment Contact: [phone] [place] call patient about her missed appointment. She wants to reschedule

## 2015-07-04 NOTE — Telephone Encounter (Signed)
Spoke with patient and she understands that she is within her 6 months. She does not need another Surgical clearance but she is needing labs. I made her aware we need to be sure we are ordering the correct labs so she would need to find out what the surgeon is requesting and get back to [place] and we will call and schedule. She verbalized understanding and has agreed to call and will call back to schedule.    KP

## 2015-07-04 NOTE — Telephone Encounter (Signed)
No charge. 

## 2015-07-04 NOTE — Telephone Encounter (Signed)
Charge or no charge for surgical clearance appt 07/02/15 pt missed?

## 2015-07-11 ENCOUNTER — Telehealth: Payer: Self-pay | Admitting: Family Medicine

## 2015-07-11 ENCOUNTER — Other Ambulatory Visit: Payer: Self-pay | Admitting: Adult Health

## 2015-07-13 ENCOUNTER — Other Ambulatory Visit: Payer: Self-pay | Admitting: Orthopedic Surgery

## 2015-07-18 NOTE — Telephone Encounter (Signed)
.  mychart

## 2015-07-24 NOTE — Telephone Encounter (Signed)
Pt states she goes to hospital next week for surgery and she'll get it then or call us after she is home.

## 2015-07-25 ENCOUNTER — Encounter (HOSPITAL_BASED_OUTPATIENT_CLINIC_OR_DEPARTMENT_OTHER): Payer: Self-pay | Admitting: *Deleted

## 2015-07-26 ENCOUNTER — Other Ambulatory Visit: Payer: Self-pay | Admitting: Family Medicine

## 2015-07-26 NOTE — Telephone Encounter (Signed)
Please advise      KP 

## 2015-07-31 NOTE — H&P (Signed)
Brandi [name] is an 70 y.o. female.    Chief Complaint: Left Foot Pain  HPI: Brandi [name] reports that her left total hip continues to do well.  She is having her right total hip done sometime in February or March.  Her main issues today are a left foot bunion deformity is failed conservative treatment with Dr. Eddie [name], as well as a right great toe nail there is had intermittent perineal he and she would like to have the nail removed.  The patient is accustomed to getting 3-4 Percocet a day, but as Brandi [name] retiring.  I've informed her that we will be dropping down to one tablet twice a day and her next refill is not due until 21 December.  Past Medical History  Diagnosis Date  . Osteoarthritis   . Chronic back pain   . Constipation   . Insomnia     takes Restoril and Trazodone nightly as needed  . Depression     takes Lexapro daily  . Anxiety     takes Valium daily as needed  . GERD (gastroesophageal reflux disease)     takes Zantac daily  . PONV (postoperative nausea and vomiting)   . Joint pain   . Joint swelling   . Peripheral edema     occasionally but never been on fluid pill  . Chronic back pain   . History of gastric ulcer   . History of GI bleed   . History of blood transfusion     no abnormal reaction noted  . History of colon polyps     benign  . Nocturia     Past Surgical History  Procedure Laterality Date  . Foot surgery Right   . Lumbar fusion    . Shoulder surgery Left   . Esophagogastroduodenoscopy N/A 06/16/2014    Procedure: ESOPHAGOGASTRODUODENOSCOPY (EGD);  Surgeon: Brandi Sabins, MD;  Location: [facility] ENDOSCOPY;  Service: Endoscopy;  Laterality: N/A;  . Colonoscopy    . Wisdom teeth extracted    . Total hip arthroplasty Left 02/26/2015    Procedure: TOTAL HIP ARTHROPLASTY ANTERIOR APPROACH;  Surgeon: Brandi Pear, MD;  Location: [facility];  Service: Orthopedics;  Laterality: Left;    Family History  Problem Relation Age of Onset  . Cancer Mother     colon cancer   . Cancer Father     colon cancer  . Heart disease Neg Hx   . Cancer Sister 6    died of colon cancer   Social History:  reports that she has never smoked. She has never used smokeless tobacco. She reports that she does not drink alcohol or use illicit drugs.  Allergies:  Allergies  Allergen Reactions  . Aspirin     Stomach ulcer, now inactive, OK with low dose ASA    No prescriptions prior to admission    No results found for this or any previous visit (from the past 48 hour(s)). No results found.  Review of Systems  Constitutional: Negative.   HENT: Negative.   Eyes: Negative.   Respiratory: Negative.   Cardiovascular: Negative.   Gastrointestinal: Negative.   Genitourinary: Negative.   Musculoskeletal: Positive for joint pain.  Skin: Negative.   Neurological: Negative.   Endo/Heme/Allergies: Negative.   Psychiatric/Behavioral: Negative.     Height 5\' 4"  (1.626 m), weight 62.143 kg (137 lb). Physical Exam  Constitutional: She is oriented to person, place, and time. She appears well-developed and well-nourished.  HENT:  Head: Normocephalic and atraumatic.  Eyes: Pupils are equal, round, and reactive to light.  Neck: Normal range of motion. Neck supple.  Cardiovascular: Intact distal pulses.   Respiratory: Effort normal.  Musculoskeletal: She exhibits tenderness.  The patient's left foot has a hallux valgus angle of about 45 of her second metatarsal angle is less than 9.  The bunion deformity is correctable passively.  The skin is intact and she is neurologically intact distally.  X-rays of the left foot are reviewed showing minimal arthritic changes at the MTP joint.  The right foot great toe has a nail is about 50% delaminated from the bed and the nail itself is thickened about 3 times normal thickness, consistent with possible fungal involvement.  There is no erythema or no drainage around the toe itself.  The right hip has significant pain with internal rotation.   Neurological: She is alert and oriented to person, place, and time.  Skin: Skin is warm and dry.  Psychiatric: She has a normal mood and affect. Her behavior is normal. Judgment and thought content normal.     Assessment/Plan Assess:  1.  Left foot bunion deformity, right foot, chronic intermittent delaminated great toe.  Plan: Surgical scheduling sheet filled out for a left foot bunion correction using a silver procedure, we will also remove the right great toenail under the same anesthetic.  After waiting about 6 weeks she would be a candidate for right total hip arthroplasty.  We will use an anterior approach as we used on the contralateral side.    Brandi [name] R, PA-C 07/31/2015, 11:33 AM

## 2015-08-01 ENCOUNTER — Ambulatory Visit (HOSPITAL_BASED_OUTPATIENT_CLINIC_OR_DEPARTMENT_OTHER): Payer: Medicare Other | Admitting: Anesthesiology

## 2015-08-01 ENCOUNTER — Encounter (HOSPITAL_BASED_OUTPATIENT_CLINIC_OR_DEPARTMENT_OTHER)
Admission: RE | Disposition: A | Discharge status: Home / Self Care | Payer: Self-pay | Source: Ambulatory Visit | Attending: Orthopedic Surgery

## 2015-08-01 ENCOUNTER — Encounter (HOSPITAL_BASED_OUTPATIENT_CLINIC_OR_DEPARTMENT_OTHER): Payer: Self-pay | Admitting: *Deleted

## 2015-08-01 ENCOUNTER — Ambulatory Visit (HOSPITAL_BASED_OUTPATIENT_CLINIC_OR_DEPARTMENT_OTHER)
Admission: RE | Admit: 2015-08-01 | Discharge: 2015-08-01 | Disposition: A | Discharge status: Home / Self Care | Payer: Medicare Other | Source: Ambulatory Visit | Attending: Orthopedic Surgery | Admitting: Orthopedic Surgery

## 2015-08-01 DIAGNOSIS — M21612 Bunion of left foot: Secondary | ICD-10-CM | POA: Insufficient documentation

## 2015-08-01 DIAGNOSIS — G8929 Other chronic pain: Secondary | ICD-10-CM | POA: Diagnosis not present

## 2015-08-01 DIAGNOSIS — Z96642 Presence of left artificial hip joint: Secondary | ICD-10-CM | POA: Insufficient documentation

## 2015-08-01 DIAGNOSIS — F419 Anxiety disorder, unspecified: Secondary | ICD-10-CM | POA: Diagnosis not present

## 2015-08-01 DIAGNOSIS — M549 Dorsalgia, unspecified: Secondary | ICD-10-CM | POA: Diagnosis not present

## 2015-08-01 DIAGNOSIS — Z5309 Procedure and treatment not carried out because of other contraindication: Secondary | ICD-10-CM | POA: Diagnosis not present

## 2015-08-01 DIAGNOSIS — K219 Gastro-esophageal reflux disease without esophagitis: Secondary | ICD-10-CM | POA: Insufficient documentation

## 2015-08-01 DIAGNOSIS — F329 Major depressive disorder, single episode, unspecified: Secondary | ICD-10-CM | POA: Diagnosis not present

## 2015-08-01 DIAGNOSIS — Z79899 Other long term (current) drug therapy: Secondary | ICD-10-CM | POA: Diagnosis not present

## 2015-08-01 SURGERY — BUNIONECTOMY
Anesthesia: Choice | Site: Foot | Laterality: Right

## 2015-08-01 MED ORDER — DEXTROSE-NACL 5-0.45 % IV SOLN: INTRAVENOUS | Status: DC

## 2015-08-01 MED ORDER — FENTANYL CITRATE (PF) 100 MCG/2ML IJ SOLN: 50.0000 ug | INTRAMUSCULAR | Status: DC | PRN

## 2015-08-01 MED ORDER — GLYCOPYRROLATE 0.2 MG/ML IJ SOLN: 0.2000 mg | Freq: Once | INTRAMUSCULAR | Status: DC | PRN

## 2015-08-01 MED ORDER — CEFAZOLIN SODIUM-DEXTROSE 2-3 GM-% IV SOLR
INTRAVENOUS | Status: AC
  Filled 2015-08-01: qty 50

## 2015-08-01 MED ORDER — CHLORHEXIDINE GLUCONATE 4 % EX LIQD: 60.0000 mL | Freq: Once | CUTANEOUS | Status: DC

## 2015-08-01 MED ORDER — LACTATED RINGERS IV SOLN
INTRAVENOUS | Status: DC
  Administered 2015-08-01: 11:00:00 via INTRAVENOUS

## 2015-08-01 MED ORDER — CEFAZOLIN SODIUM-DEXTROSE 2-3 GM-% IV SOLR: 2.0000 g | INTRAVENOUS | Status: DC

## 2015-08-01 MED ORDER — SCOPOLAMINE 1 MG/3DAYS TD PT72: 1.0000 | MEDICATED_PATCH | Freq: Once | TRANSDERMAL | Status: DC | PRN

## 2015-08-01 MED ORDER — MIDAZOLAM HCL 2 MG/2ML IJ SOLN: 1.0000 mg | INTRAMUSCULAR | Status: DC | PRN

## 2015-08-01 NOTE — Anesthesia Preprocedure Evaluation (Signed)
Anesthesia Evaluation    History of Anesthesia Complications (+) PONV and history of anesthetic complications  Airway Mallampati: II  TM Distance: >3 FB     Dental  (+) Teeth Intact, Dental Advisory Given   Pulmonary neg pulmonary ROS,    Pulmonary exam normal        Cardiovascular hypertension, Normal cardiovascular exam     Neuro/Psych PSYCHIATRIC DISORDERS Anxiety Depression negative neurological ROS     GI/Hepatic Neg liver ROS, GERD  ,  Endo/Other  negative endocrine ROS  Renal/GU negative Renal ROS     Musculoskeletal   Abdominal   Peds  Hematology   Anesthesia Other Findings   Reproductive/Obstetrics                             Anesthesia Physical Anesthesia Plan  ASA: II  Anesthesia Plan: General   Post-op Pain Management: GA combined w/ Regional for post-op pain   Induction: Intravenous  Airway Management Planned: LMA  Additional Equipment:   Intra-op Plan:   Post-operative Plan: Extubation in OR  Informed Consent: I have reviewed the patients History and Physical, chart, labs and discussed the procedure including the risks, benefits and alternatives for the proposed anesthesia with the patient or authorized representative who has indicated his/her understanding and acceptance.   Dental advisory given  Plan Discussed with: CRNA and Anesthesiologist  Anesthesia Plan Comments:         Anesthesia Quick Evaluation

## 2015-08-01 NOTE — Progress Notes (Signed)
Pre op ISTAT results given to Dr Tobias Alexander. K 2.4 and surgery cancelled. Dr Tobias Alexander spoke to patient, and Dr Mayer Camel notified who will speak to patient on his arrival.

## 2015-08-02 ENCOUNTER — Encounter: Payer: Self-pay | Admitting: Family Medicine

## 2015-08-02 ENCOUNTER — Ambulatory Visit (INDEPENDENT_AMBULATORY_CARE_PROVIDER_SITE_OTHER): Payer: Medicare Other | Admitting: Family Medicine

## 2015-08-02 VITALS — BP 142/84 | HR 99 | Temp 98.4°F | Wt 134.4 lb

## 2015-08-02 DIAGNOSIS — E876 Hypokalemia: Secondary | ICD-10-CM

## 2015-08-02 DIAGNOSIS — G4700 Insomnia, unspecified: Secondary | ICD-10-CM

## 2015-08-02 MED ORDER — TEMAZEPAM 30 MG PO CAPS: 30.0000 mg | ORAL_CAPSULE | Freq: Every evening | ORAL | Status: DC | PRN

## 2015-08-02 NOTE — Progress Notes (Signed)
Patient ID: LAURN DEMBSKI, female    DOB: 1945/07/07  Age: 70 y.o. MRN: [id number]    Subjective:  Subjective HPI MILYN ZYLSTRA presents for f/u potassium.  She went in for presurgical testing and her K was low so surgery was cancelled until K is up.  She needs it rechecked today  Review of Systems  Constitutional: Negative for diaphoresis, appetite change, fatigue and unexpected weight change.  Eyes: Negative for pain, redness and visual disturbance.  Respiratory: Negative for cough, chest tightness, shortness of breath and wheezing.   Cardiovascular: Negative for chest pain, palpitations and leg swelling.  Endocrine: Negative for cold intolerance, heat intolerance, polydipsia, polyphagia and polyuria.  Genitourinary: Negative for dysuria, frequency and difficulty urinating.  Neurological: Negative for dizziness, light-headedness, numbness and headaches.    History Past Medical History  Diagnosis Date  . Osteoarthritis   . Chronic back pain   . Constipation   . Insomnia     takes Restoril and Trazodone nightly as needed  . Depression     takes Lexapro daily  . Anxiety     takes Valium daily as needed  . GERD (gastroesophageal reflux disease)     takes Zantac daily  . PONV (postoperative nausea and vomiting)   . Joint pain   . Joint swelling   . Peripheral edema     occasionally but never been on fluid pill  . Chronic back pain   . History of gastric ulcer   . History of GI bleed   . History of blood transfusion     no abnormal reaction noted  . History of colon polyps     benign  . Nocturia     She has past surgical history that includes Foot surgery (Right); Lumbar fusion; Shoulder surgery (Left); Esophagogastroduodenoscopy (N/A, 06/16/2014); Colonoscopy; wisdom teeth extracted; and Total hip arthroplasty (Left, 02/26/2015).   Her family history includes Cancer in her father and mother; Cancer (age of onset: 16) in her sister. There is no history of Heart disease.She  reports that she has never smoked. She has never used smokeless tobacco. She reports that she does not drink alcohol or use illicit drugs.  Current Outpatient Prescriptions on File Prior to Visit  Medication Sig Dispense Refill  . diclofenac sodium (VOLTAREN) 1 % GEL APPLY 2 G TOPICALLY 4 (FOUR) TIMES DAILY. 100 g 3  . oxyCODONE-acetaminophen (PERCOCET) 10-325 MG per tablet Take 1 tablet by mouth every 4 (four) hours as needed for pain. 0.5 - 1 tab PO Q 4 hours PRN    . potassium chloride SA (K-DUR,KLOR-CON) 20 MEQ tablet Take 2 tablets (40 mEq total) by mouth daily. 30 tablet 2  . PREMPRO 0.3-1.5 MG tablet TAKE 1 TABLET BY MOUTH EVERY DAY 28 tablet 0  . traZODone (DESYREL) 50 MG tablet 1 po qhs prn 30 tablet 1  . ranitidine (ZANTAC) 150 MG tablet Take 1 tablet (150 mg total) by mouth 2 (two) times daily. (Patient not taking: Reported on 08/02/2015) 180 tablet 3   No current facility-administered medications on file prior to visit.     Objective:  Objective Physical Exam  Constitutional: She is oriented to person, place, and time. She appears well-developed and well-nourished.  HENT:  Head: Normocephalic and atraumatic.  Eyes: Conjunctivae and EOM are normal.  Neck: Normal range of motion. Neck supple. No JVD present. Carotid bruit is not present. No thyromegaly present.  Cardiovascular: Normal rate, regular rhythm and normal heart sounds.   No murmur heard.  Pulmonary/Chest: Effort normal and breath sounds normal. No respiratory distress. She has no wheezes. She has no rales. She exhibits no tenderness.  Musculoskeletal: She exhibits no edema.  Neurological: She is alert and oriented to person, place, and time.  Psychiatric: She has a normal mood and affect. Her behavior is normal. Judgment and thought content normal.  Nursing note and vitals reviewed.  BP 142/84 mmHg  Pulse 99  Temp(Src) 98.4 F (36.9 C) (Oral)  Wt 134 lb 6.4 oz (60.963 kg)  SpO2 98% Wt Readings from Last 3  Encounters:  08/02/15 134 lb 6.4 oz (60.963 kg)  08/01/15 131 lb 6 oz (59.591 kg)  04/19/15 137 lb 9.6 oz (62.415 kg)     Lab Results  Component Value Date   WBC 6.6 04/19/2015   HGB 12.9 04/19/2015   HCT 40.0 04/19/2015   PLT 494.0* 04/19/2015   GLUCOSE 93 08/02/2015   ALT 11 06/29/2014   AST 17 06/29/2014   NA 139 08/02/2015   K 2.9* 08/02/2015   CL 94* 08/02/2015   CREATININE 1.13 08/02/2015   BUN 10 08/02/2015   CO2 34* 08/02/2015   TSH 0.625 06/14/2013   INR 1.12 02/15/2015    No results found.   Assessment & Plan:  Plan I have discontinued Ms. Janowiak's escitalopram, temazepam, and tizanidine. I am also having her start on temazepam. Additionally, I am having her maintain her ranitidine, diclofenac sodium, traZODone, oxyCODONE-acetaminophen, potassium chloride SA, PREMPRO, and Eszopiclone.  Meds ordered this encounter  Medications  . Eszopiclone 3 MG TABS    Sig: Take 1 tablet by mouth at bedtime.  . temazepam (RESTORIL) 30 MG capsule    Sig: Take 1 capsule (30 mg total) by mouth at bedtime as needed for sleep.    Dispense:  30 capsule    Refill:  0    Problem List Items Addressed This Visit      Unprioritized   Insomnia   Relevant Medications   temazepam (RESTORIL) 30 MG capsule    Other Visit Diagnoses    Low blood potassium    -  Primary    Relevant Orders    Basic metabolic panel (Completed)       Follow-up: Return if symptoms worsen or fail to improve.  Garnet Koyanagi, DO

## 2015-08-02 NOTE — Progress Notes (Signed)
Pre visit review using our clinic review tool, if applicable. No additional management support is needed unless otherwise documented below in the visit note. 

## 2015-08-02 NOTE — Patient Instructions (Signed)

## 2015-08-03 ENCOUNTER — Ambulatory Visit: Payer: Medicare Other | Admitting: Family Medicine

## 2015-08-03 LAB — BASIC METABOLIC PANEL
BUN: 10 mg/dL (ref 6–23)
CALCIUM: 9.8 mg/dL (ref 8.4–10.5)
CHLORIDE: 94 meq/L — AB (ref 96–112)
CO2: 34 meq/L — AB (ref 19–32)
Creatinine, Ser: 1.13 mg/dL (ref 0.40–1.20)
GFR: 61.62 mL/min (ref >60.00)
Glucose, Bld: 93 mg/dL (ref 70–99)
POTASSIUM: 2.9 meq/L — AB (ref 3.5–5.1)
SODIUM: 139 meq/L (ref 135–145)

## 2015-08-06 ENCOUNTER — Telehealth: Payer: Self-pay

## 2015-08-06 DIAGNOSIS — E876 Hypokalemia: Secondary | ICD-10-CM

## 2015-08-06 NOTE — Telephone Encounter (Signed)
Patient has been made aware and she verbalized understanding, she has agreed to take 2 potassium and will come in on Friday to repeat her labs.      KP

## 2015-08-06 NOTE — Telephone Encounter (Signed)
-----   Message from Margot Ables sent at 08/06/2015  9:06 AM EST ----- Regarding: please call  Contact: [phone] Pt also states her husband called over the weekend. She needs to know about potassium level so she can schedule surgery and he wants to be here but is currently in [place].

## 2015-08-10 ENCOUNTER — Other Ambulatory Visit (INDEPENDENT_AMBULATORY_CARE_PROVIDER_SITE_OTHER): Payer: Medicare Other

## 2015-08-10 DIAGNOSIS — E876 Hypokalemia: Secondary | ICD-10-CM

## 2015-08-10 LAB — BASIC METABOLIC PANEL
BUN: 7 mg/dL (ref 6–23)
CALCIUM: 9.4 mg/dL (ref 8.4–10.5)
CO2: 25 mEq/L (ref 19–32)
Chloride: 107 mEq/L (ref 96–112)
Creatinine, Ser: 1.13 mg/dL (ref 0.40–1.20)
GFR: 61.62 mL/min (ref >60.00)
Glucose, Bld: 77 mg/dL (ref 70–99)
POTASSIUM: 4.6 meq/L (ref 3.5–5.1)
SODIUM: 138 meq/L (ref 135–145)

## 2015-08-11 ENCOUNTER — Other Ambulatory Visit: Payer: Self-pay | Admitting: Family Medicine

## 2015-08-13 ENCOUNTER — Telehealth: Payer: Self-pay | Admitting: *Deleted

## 2015-08-13 LAB — POCT I-STAT, CHEM 8
BUN: 13 mg/dL (ref 6–20)
CHLORIDE: 88 mmol/L — AB (ref 101–111)
Calcium, Ion: 1.1 mmol/L — ABNORMAL LOW (ref 1.13–1.30)
Creatinine, Ser: 1.2 mg/dL — ABNORMAL HIGH (ref 0.44–1.00)
GLUCOSE: 97 mg/dL (ref 65–99)
HCT: 38 % (ref 36.0–46.0)
Hemoglobin: 12.9 g/dL (ref 12.0–15.0)
POTASSIUM: 2.4 mmol/L — AB (ref 3.5–5.1)
SODIUM: 137 mmol/L (ref 135–145)
TCO2: 35 mmol/L (ref 0–100)

## 2015-08-13 NOTE — Telephone Encounter (Signed)
Pt called requesting potassium results and requesting results be faxed to Dr. Damita Dunnings office at [phone]. Pt notified of results and verbalized understanding. Pt informed that she will receive a letter in the mail w/ results. Labs faxed as requested.

## 2015-08-13 NOTE — Telephone Encounter (Signed)
Last seen 08/02/15 and filled 02/14/15 #30 with 1 refill   Please advise   KP

## 2015-08-17 DIAGNOSIS — M21612 Bunion of left foot: Secondary | ICD-10-CM | POA: Diagnosis not present

## 2015-08-17 DIAGNOSIS — M2012 Hallux valgus (acquired), left foot: Secondary | ICD-10-CM | POA: Diagnosis not present

## 2015-08-17 DIAGNOSIS — B351 Tinea unguium: Secondary | ICD-10-CM | POA: Diagnosis not present

## 2015-08-17 DIAGNOSIS — G8918 Other acute postprocedural pain: Secondary | ICD-10-CM | POA: Diagnosis not present

## 2015-08-30 ENCOUNTER — Telehealth: Payer: Self-pay | Admitting: Family Medicine

## 2015-08-30 ENCOUNTER — Other Ambulatory Visit: Payer: Self-pay | Admitting: Family Medicine

## 2015-08-30 DIAGNOSIS — Z9889 Other specified postprocedural states: Secondary | ICD-10-CM | POA: Diagnosis not present

## 2015-08-30 NOTE — Telephone Encounter (Signed)
error:[id number] ° °

## 2015-08-31 MED FILL — diazePAM 10 MG TABS: 10 | 22 days supply | Qty: 45 | Fill #0 | Status: TO

## 2015-08-31 NOTE — Telephone Encounter (Signed)
Last seen 08/02/15 and filled 01/05/15 #45 with 2 refills.   Please advise    KP

## 2015-09-03 ENCOUNTER — Other Ambulatory Visit: Payer: Self-pay | Admitting: Family Medicine

## 2015-09-04 ENCOUNTER — Other Ambulatory Visit: Payer: Self-pay | Admitting: Family Medicine

## 2015-09-04 ENCOUNTER — Encounter: Payer: Self-pay | Admitting: Family Medicine

## 2015-09-04 ENCOUNTER — Ambulatory Visit (INDEPENDENT_AMBULATORY_CARE_PROVIDER_SITE_OTHER): Payer: Medicare Other | Admitting: Family Medicine

## 2015-09-04 VITALS — BP 120/72 | HR 74 | Temp 99.1°F | Wt 140.4 lb

## 2015-09-04 DIAGNOSIS — R8299 Other abnormal findings in urine: Secondary | ICD-10-CM | POA: Diagnosis not present

## 2015-09-04 DIAGNOSIS — R5383 Other fatigue: Secondary | ICD-10-CM

## 2015-09-04 DIAGNOSIS — R82998 Other abnormal findings in urine: Secondary | ICD-10-CM

## 2015-09-04 LAB — POCT URINALYSIS DIPSTICK
BILIRUBIN UA: NEGATIVE
Glucose, UA: NEGATIVE
KETONES UA: NEGATIVE
Nitrite, UA: NEGATIVE
PH UA: 8
RBC UA: NEGATIVE
SPEC GRAV UA: 1.015
Urobilinogen, UA: 0.2

## 2015-09-04 NOTE — Progress Notes (Signed)
Patient ID: Brandi [name], female    DOB: May 06, 1946  Age: 70 y.o. MRN: [id number]    Subjective:  Subjective HPI Brandi [name] presents for f/u low potassium.  Pt is feeling tired and would like her K checked.    Review of Systems  Constitutional: Positive for fatigue. Negative for diaphoresis, appetite change and unexpected weight change.  Eyes: Negative for pain, redness and visual disturbance.  Respiratory: Negative for cough, chest tightness, shortness of breath and wheezing.   Cardiovascular: Negative for chest pain, palpitations and leg swelling.  Endocrine: Negative for cold intolerance, heat intolerance, polydipsia, polyphagia and polyuria.  Genitourinary: Negative for dysuria, frequency and difficulty urinating.  Neurological: Negative for dizziness, light-headedness, numbness and headaches.    History Past Medical History  Diagnosis Date  . Osteoarthritis   . Chronic back pain   . Constipation   . Insomnia     takes Restoril and Trazodone nightly as needed  . Depression     takes Lexapro daily  . Anxiety     takes Valium daily as needed  . GERD (gastroesophageal reflux disease)     takes Zantac daily  . PONV (postoperative nausea and vomiting)   . Joint pain   . Joint swelling   . Peripheral edema     occasionally but never been on fluid pill  . Chronic back pain   . History of gastric ulcer   . History of GI bleed   . History of blood transfusion     no abnormal reaction noted  . History of colon polyps     benign  . Nocturia     She has past surgical history that includes Foot surgery (Right); Lumbar fusion; Shoulder surgery (Left); Esophagogastroduodenoscopy (N/A, 06/16/2014); Colonoscopy; wisdom teeth extracted; and Total hip arthroplasty (Left, 02/26/2015).   Her family history includes Cancer in her father and mother; Cancer (age of onset: 25) in her sister. There is no history of Heart disease.She reports that she has never smoked. She has never used  smokeless tobacco. She reports that she does not drink alcohol or use illicit drugs.  Current Outpatient Prescriptions on File Prior to Visit  Medication Sig Dispense Refill  . diazepam (VALIUM) 10 MG tablet TAKE 1 TABLET BY MOUTH EVERY 12 HOURS AS NEEDED FOR ANXIETY 45 tablet 2  . diclofenac sodium (VOLTAREN) 1 % GEL APPLY 2 G TOPICALLY 4 (FOUR) TIMES DAILY. 100 g 3  . Eszopiclone 3 MG TABS Take 1 tablet by mouth at bedtime.    Marland Kitchen oxyCODONE-acetaminophen (PERCOCET) 10-325 MG per tablet Take 1 tablet by mouth every 4 (four) hours as needed for pain. 0.5 - 1 tab PO Q 4 hours PRN    . potassium chloride SA (K-DUR,KLOR-CON) 20 MEQ tablet Take 2 tablets (40 mEq total) by mouth daily. 30 tablet 2  . ranitidine (ZANTAC) 150 MG tablet Take 1 tablet (150 mg total) by mouth 2 (two) times daily. (Patient not taking: Reported on 08/02/2015) 180 tablet 3  . temazepam (RESTORIL) 30 MG capsule Take 1 capsule (30 mg total) by mouth at bedtime as needed for sleep. 30 capsule 0  . traZODone (DESYREL) 50 MG tablet TAKE 1 TABLET BY MOUTH AT BEDTIME 30 tablet 0   No current facility-administered medications on file prior to visit.     Objective:  Objective Physical Exam  Constitutional: She is oriented to person, place, and time. She appears well-developed and well-nourished.  HENT:  Head: Normocephalic and atraumatic.  Eyes: Conjunctivae  and EOM are normal.  Neck: Normal range of motion. Neck supple. No JVD present. Carotid bruit is not present. No thyromegaly present.  Cardiovascular: Normal rate, regular rhythm and normal heart sounds.   No murmur heard. Pulmonary/Chest: Effort normal and breath sounds normal. No respiratory distress. She has no wheezes. She has no rales. She exhibits no tenderness.  Musculoskeletal: She exhibits no edema.  Neurological: She is alert and oriented to person, place, and time.  Psychiatric: She has a normal mood and affect. Her behavior is normal. Judgment and thought content  normal.   BP 120/72 mmHg  Pulse 74  Temp(Src) 99.1 F (37.3 C) (Oral)  Wt 140 lb 6.4 oz (63.685 kg)  SpO2 97% Wt Readings from Last 3 Encounters:  09/04/15 140 lb 6.4 oz (63.685 kg)  08/02/15 134 lb 6.4 oz (60.963 kg)  08/01/15 131 lb 6 oz (59.591 kg)     Lab Results  Component Value Date   WBC 9.0 09/04/2015   HGB 10.6* 09/04/2015   HCT 32.6* 09/04/2015   PLT 285.0 09/04/2015   GLUCOSE 67* 09/04/2015   ALT 6 09/04/2015   AST 12 09/04/2015   NA 141 09/04/2015   K 3.5 09/04/2015   CL 101 09/04/2015   CREATININE 0.94 09/04/2015   BUN 9 09/04/2015   CO2 31 09/04/2015   TSH 0.58 09/04/2015   INR 1.12 02/15/2015    No results found.   Assessment & Plan:  Plan I am having [name] maintain her ranitidine, diclofenac sodium, oxyCODONE-acetaminophen, potassium chloride SA, Eszopiclone, temazepam, traZODone, and diazepam.  No orders of the defined types were placed in this encounter.    Problem List Items Addressed This Visit      Unprioritized   Fatigue - Primary    Check labs--  Pt with hx of low potassium       Relevant Orders   Basic metabolic panel (Completed)   CBC with Differential/Platelet (Completed)   Hepatic function panel (Completed)   POCT urinalysis dipstick (Completed)   TSH (Completed)    Other Visit Diagnoses    Other abnormal findings in urine        Relevant Orders    Urine culture       Follow-up: Return if symptoms worsen or fail to improve.  Garnet Koyanagi, DO

## 2015-09-04 NOTE — Progress Notes (Signed)
Pre visit review using our clinic review tool, if applicable. No additional management support is needed unless otherwise documented below in the visit note. 

## 2015-09-04 NOTE — Patient Instructions (Signed)
Fatigue  Fatigue is feeling tired all of the time, a lack of energy, or a lack of motivation. Occasional or mild fatigue is often a normal response to activity or life in general. However, long-lasting (chronic) or extreme fatigue may indicate an underlying medical condition.  HOME CARE INSTRUCTIONS   Watch your fatigue for any changes. The following actions may help to lessen any discomfort you are feeling:  · Talk to your health care provider about how much sleep you need each night. Try to get the required amount every night.  · Take medicines only as directed by your health care provider.  · Eat a healthy and nutritious diet. Ask your health care provider if you need help changing your diet.  · Drink enough fluid to keep your urine clear or pale yellow.  · Practice ways of relaxing, such as yoga, meditation, massage therapy, or acupuncture.  · Exercise regularly.    · Change situations that cause you stress. Try to keep your work and personal routine reasonable.  · Do not abuse illegal drugs.  · Limit alcohol intake to no more than 1 drink per day for nonpregnant women and 2 drinks per day for men. One drink equals 12 ounces of beer, 5 ounces of wine, or 1½ ounces of hard liquor.  · Take a multivitamin, if directed by your health care provider.  SEEK MEDICAL CARE IF:   · Your fatigue does not get better.  · You have a fever.    · You have unintentional weight loss or gain.  · You have headaches.    · You have difficulty:      Falling asleep.    Sleeping throughout the night.  · You feel angry, guilty, anxious, or sad.     · You are unable to have a bowel movement (constipation).    · You skin is dry.     · Your legs or another part of your body is swollen.    SEEK IMMEDIATE MEDICAL CARE IF:   · You feel confused.    · Your vision is blurry.  · You feel faint or pass out.    · You have a severe headache.    · You have severe abdominal, pelvic, or back pain.    · You have chest pain, shortness of breath, or an  irregular or fast heartbeat.    · You are unable to urinate or you urinate less than normal.    · You develop abnormal bleeding, such as bleeding from the rectum, vagina, nose, lungs, or nipples.  · You vomit blood.     · You have thoughts about harming yourself or committing suicide.    · You are worried that you might harm someone else.       This information is not intended to replace advice given to you by your health care provider. Make sure you discuss any questions you have with your health care provider.     Document Released: 03/30/2007 Document Revised: 06/23/2014 Document Reviewed: 10/04/2013  Elsevier Interactive Patient Education ©2016 [employer]

## 2015-09-04 NOTE — Telephone Encounter (Signed)
Medication filled to pharmacy as requested.   

## 2015-09-05 ENCOUNTER — Other Ambulatory Visit: Payer: Self-pay

## 2015-09-05 LAB — URINE CULTURE: Colony Count: 25000

## 2015-09-05 LAB — BASIC METABOLIC PANEL
BUN: 9 mg/dL (ref 6–23)
CHLORIDE: 101 meq/L (ref 96–112)
CO2: 31 meq/L (ref 19–32)
CREATININE: 0.94 mg/dL (ref 0.40–1.20)
Calcium: 9.4 mg/dL (ref 8.4–10.5)
GFR: 76.19 mL/min (ref >60.00)
Glucose, Bld: 67 mg/dL — ABNORMAL LOW (ref 70–99)
POTASSIUM: 3.5 meq/L (ref 3.5–5.1)
Sodium: 141 mEq/L (ref 135–145)

## 2015-09-05 LAB — CBC WITH DIFFERENTIAL/PLATELET
BASOS ABS: 0 10*3/uL (ref 0.0–0.1)
BASOS PCT: 0.4 % (ref 0.0–3.0)
EOS PCT: 3.9 % (ref 0.0–5.0)
Eosinophils Absolute: 0.4 10*3/uL (ref 0.0–0.7)
HCT: 32.6 % — ABNORMAL LOW (ref 36.0–46.0)
Hemoglobin: 10.6 g/dL — ABNORMAL LOW (ref 12.0–15.0)
LYMPHS PCT: 25 % (ref 12.0–46.0)
Lymphs Abs: 2.2 10*3/uL (ref 0.7–4.0)
MCHC: 32.7 g/dL (ref 30.0–36.0)
MCV: 99.2 fl (ref 78.0–100.0)
MONOS PCT: 11.5 % (ref 3.0–12.0)
Monocytes Absolute: 1 10*3/uL (ref 0.1–1.0)
NEUTROS ABS: 5.3 10*3/uL (ref 1.4–7.7)
NEUTROS PCT: 59.2 % (ref 43.0–77.0)
PLATELETS: 285 10*3/uL (ref 150.0–400.0)
RBC: 3.29 Mil/uL — ABNORMAL LOW (ref 3.87–5.11)
RDW: 16.4 % — ABNORMAL HIGH (ref 11.5–15.5)
WBC: 9 10*3/uL (ref 4.0–10.5)

## 2015-09-05 LAB — HEPATIC FUNCTION PANEL
ALBUMIN: 3.7 g/dL (ref 3.5–5.2)
ALK PHOS: 64 U/L (ref 39–117)
ALT: 6 U/L (ref 0–35)
AST: 12 U/L (ref 0–37)
Bilirubin, Direct: 0.1 mg/dL (ref 0.0–0.3)
Total Bilirubin: 0.4 mg/dL (ref 0.2–1.2)
Total Protein: 7 g/dL (ref 6.0–8.3)

## 2015-09-05 LAB — TSH: TSH: 0.58 u[IU]/mL (ref 0.35–4.50)

## 2015-09-05 MED ORDER — CONJ ESTROG-MEDROXYPROGEST ACE 0.3-1.5 MG PO TABS: 1.0000 | ORAL_TABLET | Freq: Every day | ORAL | Status: AC

## 2015-09-05 NOTE — Assessment & Plan Note (Signed)
Check labs--  Pt with hx of low potassium

## 2015-09-13 DIAGNOSIS — M79672 Pain in left foot: Secondary | ICD-10-CM | POA: Diagnosis not present

## 2015-09-17 ENCOUNTER — Ambulatory Visit: Payer: Medicare Other | Admitting: Family Medicine

## 2015-09-19 ENCOUNTER — Other Ambulatory Visit: Payer: Self-pay | Admitting: Family Medicine

## 2015-09-20 ENCOUNTER — Ambulatory Visit: Payer: Medicare Other | Admitting: Family Medicine

## 2015-09-27 ENCOUNTER — Observation Stay (HOSPITAL_COMMUNITY)
Admission: EM | Admit: 2015-09-27 | Discharge: 2015-09-28 | Disposition: A | Discharge status: Home / Self Care | Payer: Medicare Other | Attending: Internal Medicine | Admitting: Internal Medicine

## 2015-09-27 ENCOUNTER — Encounter (HOSPITAL_COMMUNITY): Payer: Self-pay

## 2015-09-27 ENCOUNTER — Emergency Department (HOSPITAL_COMMUNITY): Payer: Medicare Other

## 2015-09-27 DIAGNOSIS — M25551 Pain in right hip: Secondary | ICD-10-CM | POA: Insufficient documentation

## 2015-09-27 DIAGNOSIS — K219 Gastro-esophageal reflux disease without esophagitis: Secondary | ICD-10-CM

## 2015-09-27 DIAGNOSIS — G8929 Other chronic pain: Secondary | ICD-10-CM | POA: Diagnosis not present

## 2015-09-27 DIAGNOSIS — Z96642 Presence of left artificial hip joint: Secondary | ICD-10-CM | POA: Diagnosis not present

## 2015-09-27 DIAGNOSIS — W109XXA Fall (on) (from) unspecified stairs and steps, initial encounter: Secondary | ICD-10-CM | POA: Insufficient documentation

## 2015-09-27 DIAGNOSIS — K5909 Other constipation: Secondary | ICD-10-CM | POA: Insufficient documentation

## 2015-09-27 DIAGNOSIS — M79604 Pain in right leg: Secondary | ICD-10-CM | POA: Diagnosis not present

## 2015-09-27 DIAGNOSIS — M79661 Pain in right lower leg: Principal | ICD-10-CM | POA: Insufficient documentation

## 2015-09-27 DIAGNOSIS — K5900 Constipation, unspecified: Secondary | ICD-10-CM | POA: Diagnosis not present

## 2015-09-27 DIAGNOSIS — M25561 Pain in right knee: Secondary | ICD-10-CM | POA: Diagnosis not present

## 2015-09-27 DIAGNOSIS — Z8719 Personal history of other diseases of the digestive system: Secondary | ICD-10-CM | POA: Insufficient documentation

## 2015-09-27 DIAGNOSIS — M79606 Pain in leg, unspecified: Secondary | ICD-10-CM | POA: Diagnosis present

## 2015-09-27 HISTORY — DX: Adverse effect of unspecified anesthetic, initial encounter: T41.45XA

## 2015-09-27 HISTORY — DX: Other complications of anesthesia, initial encounter: T88.59XA

## 2015-09-27 MED ORDER — ENSURE ENLIVE PO LIQD
237.0000 mL | Freq: Two times a day (BID) | ORAL | Status: DC
Start: 2015-09-28 — End: 2015-09-28

## 2015-09-27 MED ORDER — OXYCODONE-ACETAMINOPHEN 5-325 MG PO TABS: 1.0000 | ORAL_TABLET | Freq: Once | ORAL | Status: DC

## 2015-09-27 MED ORDER — ACETAMINOPHEN 500 MG PO TABS: 1000.0000 mg | ORAL_TABLET | Freq: Three times a day (TID) | ORAL | Status: DC | PRN

## 2015-09-27 MED ORDER — ONDANSETRON HCL 4 MG PO TABS: 4.0000 mg | ORAL_TABLET | Freq: Four times a day (QID) | ORAL | Status: DC | PRN

## 2015-09-27 MED ORDER — DIAZEPAM 5 MG PO TABS
10.0000 mg | ORAL_TABLET | Freq: Once | ORAL | Status: AC
  Administered 2015-09-27: 10 mg via ORAL
  Filled 2015-09-27: qty 2

## 2015-09-27 MED ORDER — DIAZEPAM 5 MG PO TABS: 10.0000 mg | ORAL_TABLET | Freq: Two times a day (BID) | ORAL | Status: DC | PRN

## 2015-09-27 MED ORDER — ENOXAPARIN SODIUM 40 MG/0.4ML ~~LOC~~ SOLN
40.0000 mg | SUBCUTANEOUS | Status: DC
  Administered 2015-09-27: 40 mg via SUBCUTANEOUS
  Filled 2015-09-27: qty 0.4

## 2015-09-27 MED ORDER — HYDROMORPHONE HCL 1 MG/ML IJ SOLN
0.5000 mg | INTRAMUSCULAR | Status: DC | PRN
  Administered 2015-09-27 – 2015-09-28 (×3): 0.5 mg via INTRAVENOUS
  Filled 2015-09-27 (×4): qty 1

## 2015-09-27 MED ORDER — ONDANSETRON HCL 4 MG/2ML IJ SOLN: 4.0000 mg | Freq: Four times a day (QID) | INTRAMUSCULAR | Status: DC | PRN

## 2015-09-27 MED ORDER — FAMOTIDINE 20 MG PO TABS
20.0000 mg | ORAL_TABLET | Freq: Every day | ORAL | Status: DC
  Administered 2015-09-27: 20 mg via ORAL
  Filled 2015-09-27: qty 1

## 2015-09-27 MED ORDER — TEMAZEPAM 15 MG PO CAPS: 30.0000 mg | ORAL_CAPSULE | Freq: Every evening | ORAL | Status: DC | PRN

## 2015-09-27 MED ORDER — POLYETHYLENE GLYCOL 3350 17 G PO PACK: 17.0000 g | PACK | Freq: Every day | ORAL | Status: DC | PRN

## 2015-09-27 MED ORDER — ACETAMINOPHEN ER 650 MG PO TBCR: 1300.0000 mg | EXTENDED_RELEASE_TABLET | Freq: Three times a day (TID) | ORAL | Status: DC | PRN

## 2015-09-27 MED ORDER — CYCLOBENZAPRINE HCL 5 MG PO TABS
5.0000 mg | ORAL_TABLET | Freq: Three times a day (TID) | ORAL | Status: DC
  Administered 2015-09-27 – 2015-09-28 (×2): 5 mg via ORAL
  Filled 2015-09-27 (×2): qty 1

## 2015-09-27 MED ORDER — CONJ ESTROG-MEDROXYPROGEST ACE 0.3-1.5 MG PO TABS: 1.0000 | ORAL_TABLET | Freq: Every day | ORAL | Status: DC

## 2015-09-27 MED ORDER — TRAZODONE HCL 50 MG PO TABS
50.0000 mg | ORAL_TABLET | Freq: Every day | ORAL | Status: DC
  Administered 2015-09-27: 50 mg via ORAL
  Filled 2015-09-27: qty 1

## 2015-09-27 MED ORDER — HYDROCODONE-ACETAMINOPHEN 5-325 MG PO TABS
1.0000 | ORAL_TABLET | Freq: Once | ORAL | Status: AC
  Administered 2015-09-27: 1 via ORAL
  Filled 2015-09-27: qty 1

## 2015-09-27 NOTE — ED Provider Notes (Signed)
CSN: [id number]     Arrival date & time 09/27/15  [id number] History   First MD Initiated Contact with Patient 09/27/15 [phone]     Chief Complaint  Patient presents with  . Fall   (Consider location/radiation/quality/duration/timing/severity/associated sxs/prior Treatment) HPI 70 y.o. female with a hx of chronic back pain, left hip replacement, presents to the Emergency Department today complaining of right hip pain after a fall sustained last night. Pt states that she was ambulating down the stairs with her groceries when she had a mechanical fall and slipped. Pt slid down 8 steps with the brunt of impact on her right hip. Notes head trauma, but no LOC. States pain is 9/10. Notes difficulty with ambulation. Uses a walker/cane for assistance. Pt had previous left hip replacement by Dr. Paulo Fruit. She was told that she needed a left hip replacement as well due to degenerative changes, but surgery is pending. No N/V/D. No fevers. No CP/SOB/ABD pain. No headaches. No vision changes. No other symptoms noted.    Past Medical History  Diagnosis Date  . Osteoarthritis   . Chronic back pain   . Constipation   . Insomnia     takes Restoril and Trazodone nightly as needed  . Depression     takes Lexapro daily  . Anxiety     takes Valium daily as needed  . GERD (gastroesophageal reflux disease)     takes Zantac daily  . PONV (postoperative nausea and vomiting)   . Joint pain   . Joint swelling   . Peripheral edema     occasionally but never been on fluid pill  . Chronic back pain   . History of gastric ulcer   . History of GI bleed   . History of blood transfusion     no abnormal reaction noted  . History of colon polyps     benign  . Nocturia    Past Surgical History  Procedure Laterality Date  . Foot surgery Right   . Lumbar fusion    . Shoulder surgery Left   . Esophagogastroduodenoscopy N/A 06/16/2014    Procedure: ESOPHAGOGASTRODUODENOSCOPY (EGD);  Surgeon: Missy Sabins, MD;  Location: [facility]  ENDOSCOPY;  Service: Endoscopy;  Laterality: N/A;  . Colonoscopy    . Wisdom teeth extracted    . Total hip arthroplasty Left 02/26/2015    Procedure: TOTAL HIP ARTHROPLASTY ANTERIOR APPROACH;  Surgeon: Frederik Pear, MD;  Location: [facility];  Service: Orthopedics;  Laterality: Left;   Family History  Problem Relation Age of Onset  . Cancer Mother     colon cancer  . Cancer Father     colon cancer  . Heart disease Neg Hx   . Cancer Sister 51    died of colon cancer   Social History  Substance Use Topics  . Smoking status: Never Smoker   . Smokeless tobacco: Never Used  . Alcohol Use: No   OB History    No data available     Review of Systems ROS reviewed and all are negative for acute change except as noted in the HPI.  Allergies  Aspirin  Home Medications   Prior to Admission medications   Medication Sig Start Date End Date Taking? Authorizing Provider  diazepam (VALIUM) 10 MG tablet TAKE 1 TABLET BY MOUTH EVERY 12 HOURS AS NEEDED FOR ANXIETY 08/31/15   Alferd Apa Lowne Chase, DO  diclofenac sodium (VOLTAREN) 1 % GEL APPLY 2 G TOPICALLY 4 (FOUR) TIMES DAILY. 02/05/15   Kendrick Fries  R Lowne Chase, DO  estrogen, conjugated,-medroxyprogesterone (PREMPRO) 0.3-1.5 MG tablet Take 1 tablet by mouth daily. 09/05/15   Alferd Apa Lowne Chase, DO  Eszopiclone 3 MG TABS Take 1 tablet by mouth at bedtime. 06/04/15   Historical Provider, MD  oxyCODONE-acetaminophen (PERCOCET) 10-325 MG per tablet Take 1 tablet by mouth every 4 (four) hours as needed for pain. 0.5 - 1 tab PO Q 4 hours PRN    Historical Provider, MD  potassium chloride SA (K-DUR,KLOR-CON) 20 MEQ tablet Take 2 tablets (40 mEq total) by mouth daily. 04/27/15   Rosalita Chessman Chase, DO  ranitidine (ZANTAC) 150 MG tablet Take 1 tablet (150 mg total) by mouth 2 (two) times daily. Patient not taking: Reported on 08/02/2015 11/08/13   Rosalita Chessman Chase, DO  temazepam (RESTORIL) 30 MG capsule Take 1 capsule (30 mg total) by mouth at bedtime as  needed for sleep. 08/02/15   Rosalita Chessman Chase, DO  traZODone (DESYREL) 50 MG tablet TAKE 1 TABLET BY MOUTH AT BEDTIME 09/19/15   Yvonne R Lowne Chase, DO   BP 159/108 mmHg  Pulse 70  Temp(Src) 99.4 F (37.4 C) (Oral)  Resp 18  SpO2 97%   Physical Exam  Constitutional: She is oriented to person, place, and time. She appears well-developed and well-nourished.  HENT:  Head: Normocephalic and atraumatic.  Eyes: EOM are normal. Pupils are equal, round, and reactive to light.  Neck: Normal range of motion. Neck supple. No tracheal deviation present.  Cardiovascular: Normal rate, regular rhythm, normal heart sounds and intact distal pulses.   No murmur heard. Pulmonary/Chest: Effort normal and breath sounds normal. No respiratory distress. She has no wheezes. She has no rales. She exhibits no tenderness.  Abdominal: Soft. There is no tenderness.  Musculoskeletal:       Right hip: She exhibits decreased range of motion and tenderness. She exhibits no swelling, no deformity and no laceration.  Limited ROM of right hip. Pain with inversion/eversion. No erythema/ecchymosis/lacerations/swelling noted.  Noted difficulty with ambulation. Attempted to ambulate patient personally and had difficulty with movement.   Neurological: She is alert and oriented to person, place, and time.  Cranial Nerves:  II: Pupils equal, round, reactive to light III,IV, VI: ptosis not present, extra-ocular motions intact bilaterally  V,VII: smile symmetric, facial light touch sensation equal VIII: hearing grossly normal bilaterally  IX,X: midline uvula rise  XI: bilateral shoulder shrug equal and strong XII: midline tongue extension  Skin: Skin is warm and dry.  Psychiatric: She has a normal mood and affect. Her behavior is normal. Thought content normal.  Nursing note and vitals reviewed.  ED Course  Procedures (including critical care time) Labs Review Labs Reviewed - No data to display  Imaging Review Dg  Hip Unilat With Pelvis 2-3 Views Right  09/27/2015  CLINICAL DATA:  Fall down steps few  days ago, right hip pain EXAM: DG HIP (WITH OR WITHOUT PELVIS) 2-3V RIGHT COMPARISON:  CT scan 06/15/2014 FINDINGS: Three views of the right hip submitted. No acute fracture or subluxation. Extensive osteoarthritic changes with significant narrowing of superior joint space. Cystic and sclerotic degenerative changes are noted superior aspect of femoral head and right superior acetabulum. There is lateral spurring of the right superior acetabulum. Significant spurring of femoral head. Partially visualized left hip prosthesis with anatomic alignment. Degenerative changes pubic symphysis. Postsurgical changes are noted lumbar spine L4-L5 disc space level. There is spurring of inferior right acetabulum. IMPRESSION: No acute fracture or subluxation. Extensive  degenerative changes right hip joint as described above. Partially visualized right hip prosthesis with anatomic alignment. Postsurgical changes lower lumbar spine. Degenerative changes pubic symphysis. Electronically Signed   By: Lahoma Crocker M.D.   On: 09/27/2015 09:07   I have personally reviewed and evaluated these images and lab results as part of my medical decision-making.   EKG Interpretation None     MDM  I have reviewed and evaluated the relevant laboratory values. I have reviewed and evaluated the relevant imaging studies. I have reviewed the relevant previous healthcare records. I obtained HPI from historian. Patient discussed with supervising physician  ED Course:  Assessment: Pt is a 4yF with hx Chronic Back Pain, Osteoarthritis, Left Hip replacement who presents with right hip pain s/p fall. On exam, pt in NAD. Nontoxic/nonseptic appearing. VSS. Afebrile. Lungs CTA. Heart RRR. Abdomen nontender soft. Limited ROM of right hip due to pain. Pain with ambulation and limited mobility. Xray right Hip shows no acute fractures or dislocation. Did show  extensive degenerative changes. Due to continued pain despite analgesia, CT right Hip ordered, which showed no fracture. XR right knee ordered due to limited mobility and no fracture seen. Small effusion noted.  After discussion with patient and her inability to ambulate, will admit to Observation. Discussed with attending physician who agrees.  Disposition/Plan:  Admit to Observation  Pt acknowledges and agrees with plan  Supervising Physician Blanchie Dessert, MD   Final diagnoses:  Right hip pain     Shary Decamp, PA-C 09/27/15 [place], MD 09/27/15 [id number]

## 2015-09-27 NOTE — ED Notes (Signed)
Patient returned from CT

## 2015-09-27 NOTE — ED Notes (Signed)
hospitalist at the bedside 

## 2015-09-27 NOTE — ED Notes (Addendum)
Patient here with right hip pain after falling down 8 steps last pm, no loc. Reports pain with any ambulation

## 2015-09-27 NOTE — H&P (Signed)
[facility] History and Physical  Brandi [name] [id number] DOB: 03-Jan-1946 DOA: 09/27/2015   PCP: Ann Held, DO   Chief Complaint: "I'm still hurting."  HPI: Brandi [name] is a 70 y.o. female  Past history arthritis, chronic back pain, constipation, insomnia, depression, anxiety, GERD, joint pain; presents with pain secondary to fall. Patient states that approximately 2 days ago she was walking down the stairs and fell down 8 brick steps on her right side. Patient states that she was not dizzy or lightheaded this time. She states that her left hip which she has had hip replacement on sometimes gives way. Patient states that is what happened that caused her to fall. She has been bedbound for the last few days due to pain. Patient does take medication for pain regularly at home but it has been ineffective. Patient received 2 doses of pain medication in the emergency room and still has limited mobility. Emergency provider consult the hospitalist for admission for pain control.  Patient feels her right knee also was injured in a fall but XR performed in emergency room was negative.   CODE STATUS full code  Review of Systems:  Constitutional:  No weight loss, night sweats, Fevers, chills, fatigue.  HEENT:  No headaches, Difficulty swallowing,Tooth/dental problems,Sore throat, Cardio-vascular:  No chest pain, Orthopnea, PND, swelling in lower extremities, anasarca, dizziness, palpitations  GI:  No heartburn, indigestion, abdominal pain, nausea, vomiting, diarrhea, change in bowel habits, loss of appetite  Resp:   No shortness of breath with exertion or at rest. No excess mucus, no productive cough, No non-productive cough, No coughing up of blood.No change in color of mucus.No wheezing.No chest wall deformity  Skin:  no rash or lesions.  GU:  no dysuria, change in color of urine, no urgency or frequency. No flank pain.  Musculoskeletal:   See HPI Psych:  No change  in mood or affect. No depression or anxiety. No memory loss.  Neuro:  No change in sensation, unilateral strength, or cognitive abilities  All other systems were reviewed and are negative.  Past Medical History  Diagnosis Date  . Osteoarthritis   . Chronic back pain   . Constipation   . Insomnia     takes Restoril and Trazodone nightly as needed  . Depression     takes Lexapro daily  . Anxiety     takes Valium daily as needed  . GERD (gastroesophageal reflux disease)     takes Zantac daily  . PONV (postoperative nausea and vomiting)   . Joint pain   . Joint swelling   . Peripheral edema     occasionally but never been on fluid pill  . Chronic back pain   . History of gastric ulcer   . History of GI bleed   . History of blood transfusion     no abnormal reaction noted  . History of colon polyps     benign  . Nocturia    Past Surgical History  Procedure Laterality Date  . Foot surgery Right   . Lumbar fusion    . Shoulder surgery Left   . Esophagogastroduodenoscopy N/A 06/16/2014    Procedure: ESOPHAGOGASTRODUODENOSCOPY (EGD);  Surgeon: Missy Sabins, MD;  Location: [facility] ENDOSCOPY;  Service: Endoscopy;  Laterality: N/A;  . Colonoscopy    . Wisdom teeth extracted    . 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 She has never used smokeless tobacco. She reports that she does not drink alcohol or use illicit drugs.  Allergies  Allergen Reactions  . Aspirin     Stomach ulcer, now inactive, OK with low dose ASA    Family History  Problem Relation Age of Onset  . Cancer Mother     colon cancer  . Cancer Father     colon cancer  . Heart disease Neg Hx   . Cancer Sister 33    died of colon cancer     Prior to Admission medications   Medication Sig Start Date End Date Taking?  Authorizing Provider  acetaminophen (TYLENOL) 650 MG CR tablet Take 1,300 mg by mouth every 8 (eight) hours as needed for pain.   Yes Historical Provider, MD  estrogen, conjugated,-medroxyprogesterone (PREMPRO) 0.3-1.5 MG tablet Take 1 tablet by mouth daily. 09/05/15  Yes Yvonne R Lowne Chase, DO  oxyCODONE-acetaminophen (PERCOCET) 10-325 MG per tablet Take 0.5-1 tablets by mouth every 4 (four) hours as needed for pain.    Yes Historical Provider, MD  ranitidine (ZANTAC) 150 MG tablet Take 1 tablet (150 mg total) by mouth 2 (two) times daily. Patient taking differently: Take 150 mg by mouth at bedtime.  11/08/13  Yes Yvonne R Lowne Chase, DO  diazepam (VALIUM) 10 MG tablet TAKE 1 TABLET BY MOUTH EVERY 12 HOURS AS NEEDED FOR ANXIETY 08/31/15   Alferd Apa Lowne Chase, DO  diclofenac sodium (VOLTAREN) 1 % GEL APPLY 2 G TOPICALLY 4 (FOUR) TIMES DAILY. 02/05/15   Rosalita Chessman Chase, DO  potassium chloride SA (K-DUR,KLOR-CON) 20 MEQ tablet Take 2 tablets (40 mEq total) by mouth daily. Patient taking differently: Take 20 mEq by mouth daily.  04/27/15   Rosalita Chessman Chase, DO  temazepam (RESTORIL) 30 MG capsule Take 1 capsule (30 mg total) by mouth at bedtime as needed for sleep. 08/02/15   Rosalita Chessman Chase, DO  traZODone (DESYREL) 50 MG tablet TAKE 1 TABLET BY MOUTH AT BEDTIME 09/19/15   Ann Held, DO   Physical Exam: Filed Vitals:   09/27/15 0829 09/27/15 1015 09/27/15 1200  BP: 159/108 168/96 116/98  Pulse: 70 101 99  Temp: 99.4 F (37.4 C)    TempSrc: Oral    Resp: 18    SpO2: 97% 94% 99%    Wt Readings from Last 3 Encounters:  09/04/15 63.685 kg (140 lb 6.4 oz)  08/02/15 60.963 kg (134 lb 6.4 oz)  08/01/15 59.591 kg (131 lb 6 oz)    General:  Appears calm and comfortable Eyes:  PERRL, EOMI, normal lids, iris ENT:  grossly normal hearing, lips & tongue Neck:  no LAD, masses or thyromegaly Cardiovascular:  RRR, no m/r/g. No LE edema.  Respiratory:  CTA bilaterally, no w/r/r.  Normal respiratory effort. Abdomen:  soft, ntnd Skin:  no rash or induration seen on limited exam Musculoskeletal:  grossly normal tone BUE/BLEExcept limited external and internal rotation of the right hip secondary to pain, spasm of lateral muscles of the hip and thigh, normal flexion limited extension, pain over greater low-dose lesser trochanter; R knee pain with lateral joint line tenderness on palpation, no patellar tenderness, normal patellar mobility, no effusion no bruising or swelling noted; right ankle normal Psychiatric:  grossly normal mood and affect, speech fluent and appropriate Neurologic:  CN 2-12 grossly intact, moves all extremities in coordinated fashion.  Labs on Admission:  Basic Metabolic Panel: No results for input(s): NA, K, CL, CO2, GLUCOSE, BUN, CREATININE, CALCIUM, MG, PHOS in the last 168 hours. Liver Function Tests: No results for input(s): AST, ALT, ALKPHOS, BILITOT, PROT, ALBUMIN in the last 168 hours. No results for input(s): LIPASE, AMYLASE in the last 168 hours. No results for input(s): AMMONIA in the last 168 hours. CBC: No results for input(s): WBC, NEUTROABS, HGB, HCT, MCV, PLT in the last 168 hours. Cardiac Enzymes: No results for input(s): CKTOTAL, CKMB, CKMBINDEX, TROPONINI in the last 168 hours.  BNP (last 3 results) No results for input(s): BNP in the last 8760 hours.  ProBNP (last 3 results) No results for input(s): PROBNP in the last 8760 hours.   Creatinine clearance cannot be calculated (Unknown ideal weight.)  CBG: No results for input(s): GLUCAP in the last 168 hours.  Radiological Exams on Admission: Ct Hip Right Wo Contrast  09/27/2015  CLINICAL DATA:  Right hip pain after falling yesterday. History of osteoarthritis and chronic hip pain. History of left total hip arthroplasty. EXAM: CT OF THE RIGHT HIP WITHOUT CONTRAST TECHNIQUE: Multidetector CT imaging of the right hip was performed according to the standard protocol.  Multiplanar CT image reconstructions were also generated. COMPARISON:  Right hip radiographs today.  Pelvic CT 06/15/2014. FINDINGS: The bones are adequately mineralized. There is no evidence of acute fracture or dislocation. There is advanced osteoarthritis of the right hip which has progressed from the prior pelvic CT. There is diffuse joint space loss with bone-on-bone apposition, osteophytes and subchondral cyst formation in the femoral head and acetabulum. There is a small loose body anterior medially in the joint. There is a small right hip joint effusion. No evidence of femoral head avascular necrosis. Mild degenerative changes are present at the right sacroiliac joint and symphysis pubis. Postsurgical changes from previous lower lumbar fusion are partially imaged. No focal soft tissue abnormalities observed. 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 IMPRESSION: 1. No acute osseous findings at the right hip. 2. Severe right hip osteoarthritis, progressive from pelvic CT of 15 months ago. Electronically Signed   By: Richardean Sale M.D.   On: 09/27/2015 11:44   Dg Knee Complete 4 Views Right  09/27/2015  CLINICAL DATA:  Fall with right knee pain.  Initial encounter. EXAM: RIGHT KNEE - COMPLETE 4+ VIEW COMPARISON:  None. FINDINGS: No acute fracture or dislocation is identified. Positioning is suboptimal on the lateral image. No large knee joint effusion is identified, although there may be a small effusion. Femorotibial joint space widths are preserved. A small well corticated ossicle is noted adjacent to the fibular head. Mild lateral and patellofemoral compartment marginal spurring. IMPRESSION: No acute osseous abnormality identified. Possible small knee joint effusion. Electronically Signed   By: Logan Bores M.D.   On: 09/27/2015 12:46   Dg Hip Unilat With Pelvis 2-3 Views Right  09/27/2015  CLINICAL DATA:  Fall down steps few  days ago, right hip pain EXAM: DG HIP (WITH OR WITHOUT PELVIS)  2-3V RIGHT COMPARISON:  CT scan 06/15/2014 FINDINGS: Three views of the right hip submitted. No acute fracture or subluxation. Extensive osteoarthritic changes with significant narrowing of superior joint space. Cystic and sclerotic degenerative changes are noted superior aspect of femoral head and right superior acetabulum. There is lateral spurring of the right superior acetabulum. Significant spurring of femoral head. Partially visualized left hip prosthesis with anatomic alignment. Degenerative changes pubic symphysis. Postsurgical changes are noted lumbar spine L4-L5 disc space  level. There is spurring of inferior right acetabulum. IMPRESSION: No acute fracture or subluxation. Extensive degenerative changes right hip joint as described above. Partially visualized right hip prosthesis with anatomic alignment. Postsurgical changes lower lumbar spine. Degenerative changes pubic symphysis. Electronically Signed   By: Lahoma Crocker M.D.   On: 09/27/2015 09:07    EKG: Not performed this admission  Assessment/Plan Principal Problem:   Leg pain Active Problems:   GERD (gastroesophageal reflux disease)   Chronic constipation   Right hip pain   Leg pain -Imaging negative for fracture but patient still in significant pain We'll try to relax patient's pain away with physical therapy & muscle relaxer combination; no NSAIDs due to history of stomach ulcers  GERD -Well-controlled We'll continue patient's PPI  Chronic constipation -When necessary stool softener  Code Status: full  DVT Prophylaxis: lovenox Family Communication: spoke to husband Disposition Plan: Pending Improvement    Elwin Mocha, MD Family Medicine [facility] [email or web address] Password [email or web address]

## 2015-09-27 NOTE — Progress Notes (Signed)
During admission assessment; pt was noted to have home medications at bedside and when offered to take to pharmacy for lock up; pt stated she was only going to be here for the night and her husband is coming up tonight and he will take them home with him. NT calls RN to the room and pt is noted to have some of her medication bottles with her on the bed. Medication removed and pt re-educated on call RN for pain medication and not taking home pills. RN again offered to take pills to pharmacy but pt refused and pt medications placed in bag and locked in pt room cabinet. Incoming RN informed and charge RN informed. Pt denied taking any of her home medication when asked. Delia Heady RN

## 2015-09-27 NOTE — Progress Notes (Signed)
Pt's sister is visiting pt at bedside. Pt's sister is going to take pt's 4 medication bottles with pills home with her till pt is d/c. Will continue to monitor pt. Ranelle Oyster, RN

## 2015-09-27 NOTE — Discharge Instructions (Signed)
Please read and follow all provided instructions.  Your diagnoses today include:  1. Right hip pain    Tests performed today include:  Vital signs. See below for your results today.   Medications prescribed:   None  Home care instructions:  Follow any educational materials contained in this packet.  Follow-up instructions: Please follow-up with your Orthopedic Surgeon for further evaluation of symptoms and treatment   Return instructions:   Please return to the Emergency Department if you do not get better, if you get worse, or new symptoms OR  - Fever (temperature greater than 101.97F)  - Bleeding that does not stop with holding pressure to the area    -Severe pain (please note that you may be more sore the day after your accident)  - Chest Pain  - Difficulty breathing  - Severe nausea or vomiting  - Inability to tolerate food and liquids  - Passing out  - Skin becoming red around your wounds  - Change in mental status (confusion or lethargy)  - New numbness or weakness     Please return if you have any other emergent concerns.  Additional Information:  Your vital signs today were: BP 159/108 mmHg   Pulse 70   Temp(Src) 99.4 F (37.4 C) (Oral)   Resp 18   SpO2 97% If your blood pressure (BP) was elevated above 135/85 this visit, please have this repeated by your doctor within one month. ---------------

## 2015-09-27 NOTE — Progress Notes (Signed)
ED report received at 1528 and pt arrived to the unit via stretcher at 1630. Pt A&O x4; pt oriented to the unit and room; call light, fall/safety prevention and precaution education completed with pt at bedside; pt voices understanding and agrees to call when assistance needed. Pt voided in bedpan upon arrival; skin clean, dry and intact with no pressure ulcer or wounds noted except for surgical dsg to left foot/toes. Pt stated dsg is to kept on and not removed. Pt VSS; pt in bed with call light within reach and bed alarm on. Will closely monitor. Delia Heady RN

## 2015-09-28 DIAGNOSIS — K5900 Constipation, unspecified: Secondary | ICD-10-CM | POA: Diagnosis not present

## 2015-09-28 DIAGNOSIS — M25551 Pain in right hip: Secondary | ICD-10-CM | POA: Diagnosis not present

## 2015-09-28 DIAGNOSIS — M79661 Pain in right lower leg: Secondary | ICD-10-CM | POA: Diagnosis not present

## 2015-09-28 LAB — BASIC METABOLIC PANEL
Anion gap: 10 (ref 5–15)
BUN: 6 mg/dL (ref 6–20)
CHLORIDE: 105 mmol/L (ref 101–111)
CO2: 26 mmol/L (ref 22–32)
CREATININE: 0.92 mg/dL (ref 0.44–1.00)
Calcium: 8.8 mg/dL — ABNORMAL LOW (ref 8.9–10.3)
GFR calc Af Amer: >60 mL/min (ref >60)
GFR calc non Af Amer: >60 mL/min (ref >60)
GLUCOSE: 106 mg/dL — AB (ref 65–99)
Potassium: 3.2 mmol/L — ABNORMAL LOW (ref 3.5–5.1)
SODIUM: 141 mmol/L (ref 135–145)

## 2015-09-28 LAB — CBC
HCT: 32.7 % — ABNORMAL LOW (ref 36.0–46.0)
Hemoglobin: 10.6 g/dL — ABNORMAL LOW (ref 12.0–15.0)
MCH: 31.5 pg (ref 26.0–34.0)
MCHC: 32.4 g/dL (ref 30.0–36.0)
MCV: 97.3 fL (ref 78.0–100.0)
PLATELETS: 289 10*3/uL (ref 150–400)
RBC: 3.36 MIL/uL — ABNORMAL LOW (ref 3.87–5.11)
RDW: 14 % (ref 11.5–15.5)
WBC: 7.1 10*3/uL (ref 4.0–10.5)

## 2015-09-28 MED ORDER — CYCLOBENZAPRINE HCL 5 MG PO TABS: 5.0000 mg | ORAL_TABLET | Freq: Three times a day (TID) | ORAL | Status: DC

## 2015-09-28 MED ORDER — POTASSIUM CHLORIDE CRYS ER 20 MEQ PO TBCR
40.0000 meq | EXTENDED_RELEASE_TABLET | Freq: Once | ORAL | Status: AC
  Administered 2015-09-28: 40 meq via ORAL
  Filled 2015-09-28: qty 2

## 2015-09-28 MED ORDER — OXYCODONE-ACETAMINOPHEN 10-325 MG PO TABS: 0.5000 | ORAL_TABLET | ORAL | Status: DC | PRN

## 2015-09-28 NOTE — Care Management Note (Signed)
Case Management Note  Patient Details  Name: YUVIKA BASTARDO MRN: [id number] Date of Birth: 16-Nov-1945  Subjective/Objective:                 Spoke with patient and her husband in the room. Patient in obs after what she referred to as a  "freak" fall at home down 8 stairs. She states that she would like to continue to go to outpatient PT, and declined [facility]. She states that she has a walker and two canes, and a BSC at home. She declined any further DME other than a shower seat which would be an out of pocket expense. Patient lives at home with spouse and will follow up to have hip replacement after discharge.    Action/Plan:  Anticipate DC to home today, no CM needs.  Expected Discharge Date:                  Expected Discharge Plan:  Home/Self Care  In-House Referral:     Discharge planning Services  CM Consult  Post Acute Care Choice:  NA Choice offered to:  Patient  DME Arranged:    DME Agency:     HH Arranged:    [place] Agency:     Status of Service:  Completed, signed off  Medicare Important Message Given:    Date Medicare IM Given:    Medicare IM give by:    Date Additional Medicare IM Given:    Additional Medicare Important Message give by:     If discussed at [place] of Stay Meetings, dates discussed:    Additional Comments:  Carles Collet, RN 09/28/2015, 11:14 AM

## 2015-09-28 NOTE — Discharge Summary (Signed)
Physician Discharge Summary  Brandi [name] [id number] DOB: [birth year] DOA: 09/27/2015  PCP: Ann Held, DO  Admit date: 09/27/2015 Discharge date: 09/28/2015  Time spent: 20 minutes  Recommendations for Outpatient Follow-up:  1. Follow up with PCP in 2-3 weeks   Discharge Diagnoses:  Principal Problem:   Leg pain Active Problems:   GERD (gastroesophageal reflux disease)   Chronic constipation   Right hip pain  Discharge Condition: Improved  Diet recommendation: Regular  Filed Weights   09/28/15 0038 09/28/15 0623  Weight: 71 kg (156 lb 8.4 oz) 71 kg (156 lb 8.4 oz)    History of present illness:  Please review dictated H and P from 4/13 for details. Briefly, 70 y.o. female with hx of arthritis, chronic back pain presented after falling down 8 brick steps onto right side. Imaging in ED was without acute fracture. Pt was admitted for further pain control  Hospital Course:  Leg pain -Imaging negative for fracture but [name] still in significant pain on presentation -[name] was continued with muscle relaxants and pain meds -PT evaluated with recs for outpt PT -[name] remained stable this admission  GERD -Well-controlled this admission -We'll continue [name]'s PPI  Chronic constipation -When necessary stool softener was given  Discharge Exam: Filed Vitals:   09/27/15 1637 09/27/15 2117 09/28/15 0038 09/28/15 0623  BP: 168/84 157/64  141/77  Pulse: 82 90  78  Temp: 98.9 F (37.2 C) 98.1 F (36.7 C)  97.6 F (36.4 C)  TempSrc: Oral   Oral  Resp: 18 18  18   Height:   5\' 4"  (1.626 m)   Weight:   71 kg (156 lb 8.4 oz) 71 kg (156 lb 8.4 oz)  SpO2: 100% 100%  98%    General: Awake, in nad Cardiovascular: regular, s1, s2 Respiratory: normal resp effort, no wheezing  Discharge Instructions     Medication List    TAKE these medications        acetaminophen 650 MG CR tablet  Commonly known as:  TYLENOL  Take 1,300 mg by mouth every 8 (eight)  hours as needed for pain.     cyclobenzaprine 5 MG tablet  Commonly known as:  FLEXERIL  Take 1 tablet (5 mg total) by mouth 3 (three) times daily.     diazepam 10 MG tablet  Commonly known as:  VALIUM  TAKE 1 TABLET BY MOUTH EVERY 12 HOURS AS NEEDED FOR ANXIETY     diclofenac sodium 1 % Gel  Commonly known as:  VOLTAREN  APPLY 2 G TOPICALLY 4 (FOUR) TIMES DAILY.     estrogen (conjugated)-medroxyprogesterone 0.3-1.5 MG tablet  Commonly known as:  PREMPRO  Take 1 tablet by mouth daily.     oxyCODONE-acetaminophen 10-325 MG tablet  Commonly known as:  PERCOCET  Take 0.5-1 tablets by mouth every 4 (four) hours as needed for pain.     potassium chloride SA 20 MEQ tablet  Commonly known as:  K-DUR,KLOR-CON  Take 2 tablets (40 mEq total) by mouth daily.     ranitidine 150 MG tablet  Commonly known as:  ZANTAC  Take 1 tablet (150 mg total) by mouth 2 (two) times daily.     temazepam 30 MG capsule  Commonly known as:  RESTORIL  Take 1 capsule (30 mg total) by mouth at bedtime as needed for sleep.     traZODone 50 MG tablet  Commonly known as:  DESYREL  TAKE 1 TABLET BY MOUTH AT BEDTIME  Allergies  Allergen Reactions  . Aspirin     Stomach ulcer, now inactive, OK with low dose ASA       Follow-up Information    Follow up with [facility].   Specialty:  Emergency Medicine   Contact information:   [address] [id number] [place] [phone]      Follow up with Ann Held, DO. Schedule an appointment as soon as possible for a visit in 2 weeks.   Specialty:  Family Medicine   Why:  Hospital follow up   Contact information:   [place] [address] [phone]        The results of significant diagnostics from this hospitalization (including imaging, microbiology, ancillary and laboratory) are listed below for reference.    Significant  Diagnostic Studies: Ct Hip Right Wo Contrast  09/27/2015  CLINICAL DATA:  Right hip pain after falling yesterday. History of osteoarthritis and chronic hip pain. History of left total hip arthroplasty. EXAM: CT OF THE RIGHT HIP WITHOUT CONTRAST TECHNIQUE: Multidetector CT imaging of the right hip was performed according to the standard protocol. Multiplanar CT image reconstructions were also generated. COMPARISON:  Right hip radiographs today.  Pelvic CT 06/15/2014. FINDINGS: The bones are adequately mineralized. There is no evidence of acute fracture or dislocation. There is advanced osteoarthritis of the right hip which has progressed from the prior pelvic CT. There is diffuse joint space loss with bone-on-bone apposition, osteophytes and subchondral cyst formation in the femoral head and acetabulum. There is a small loose body anterior medially in the joint. There is a small right hip joint effusion. No evidence of femoral head avascular necrosis. Mild degenerative changes are present at the right sacroiliac joint and symphysis pubis. Postsurgical changes from previous lower lumbar fusion are partially imaged. No focal soft tissue abnormalities observed. 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 IMPRESSION: 1. No acute osseous findings at the right hip. 2. Severe right hip osteoarthritis, progressive from pelvic CT of 15 months ago. Electronically Signed   By: Richardean Sale M.D.   On: 09/27/2015 11:44   Dg Knee Complete 4 Views Right  09/27/2015  CLINICAL DATA:  Fall with right knee pain.  Initial encounter. EXAM: RIGHT KNEE - COMPLETE 4+ VIEW COMPARISON:  None. FINDINGS: No acute fracture or dislocation is identified. Positioning is suboptimal on the lateral image. No large knee joint effusion is identified, although there may be a small effusion. Femorotibial joint space widths are preserved. A small well corticated ossicle is noted adjacent to the fibular head. Mild lateral and patellofemoral  compartment marginal spurring. IMPRESSION: No acute osseous abnormality identified. Possible small knee joint effusion. Electronically Signed   By: Logan Bores M.D.   On: 09/27/2015 12:46   Dg Hip Unilat With Pelvis 2-3 Views Right  09/27/2015  CLINICAL DATA:  Fall down steps few  days ago, right hip pain EXAM: DG HIP (WITH OR WITHOUT PELVIS) 2-3V RIGHT COMPARISON:  CT scan 06/15/2014 FINDINGS: Three views of the right hip submitted. No acute fracture or subluxation. Extensive osteoarthritic changes with significant narrowing of superior joint space. Cystic and sclerotic degenerative changes are noted superior aspect of femoral head and right superior acetabulum. There is lateral spurring of the right superior acetabulum. Significant spurring of femoral head. Partially visualized left hip prosthesis with anatomic alignment. Degenerative changes pubic symphysis. Postsurgical changes are noted lumbar spine L4-L5 disc space level. There is  spurring of inferior right acetabulum. IMPRESSION: No acute fracture or subluxation. Extensive degenerative changes right hip joint as described above. Partially visualized right hip prosthesis with anatomic alignment. Postsurgical changes lower lumbar spine. Degenerative changes pubic symphysis. Electronically Signed   By: Lahoma Crocker M.D.   On: 09/27/2015 09:07    Microbiology: No results found for this or any previous visit (from the past 240 hour(s)).   Labs: Basic Metabolic Panel:  Recent Labs Lab 09/28/15 0427  NA 141  K 3.2*  CL 105  CO2 26  GLUCOSE 106*  BUN 6  CREATININE 0.92  CALCIUM 8.8*   Liver Function Tests: No results for input(s): AST, ALT, ALKPHOS, BILITOT, PROT, ALBUMIN in the last 168 hours. No results for input(s): LIPASE, AMYLASE in the last 168 hours. No results for input(s): AMMONIA in the last 168 hours. CBC:  Recent Labs Lab 09/28/15 0427  WBC 7.1  HGB 10.6*  HCT 32.7*  MCV 97.3  PLT 289   Cardiac Enzymes: No results  for input(s): CKTOTAL, CKMB, CKMBINDEX, TROPONINI in the last 168 hours. BNP: BNP (last 3 results) No results for input(s): BNP in the last 8760 hours.  ProBNP (last 3 results) No results for input(s): PROBNP in the last 8760 hours.  CBG: No results for input(s): GLUCAP in the last 168 hours.   Signed:  Emali Heyward K  [facility] 09/28/2015, 5:58 PM

## 2015-09-28 NOTE — Care Management Obs Status (Signed)
[address] NOTIFICATION   Patient Details  Name: Brandi [name] MRN: [id number] Date of Birth: [birth year]   Medicare Observation Status Notification Given:  Yes    Carles Collet, RN 09/28/2015, 10:57 AM

## 2015-09-28 NOTE — Progress Notes (Signed)
Initial Nutrition Assessment  DOCUMENTATION CODES:   Not applicable  INTERVENTION:  -Continue to monitor nutritional needs  NUTRITION DIAGNOSIS:   Inadequate oral intake related to acute illness as evidenced by estimated needs.  GOAL:   Patient will meet greater than or equal to 90% of their needs  MONITOR:   PO intake, Labs, Weight trends, Skin, I & O's  REASON FOR ASSESSMENT:   Malnutrition Screening Tool    ASSESSMENT:   [name] with Past history arthritis, chronic back pain, constipation, insomnia, depression, anxiety, GERD, joint pain; presents with pain secondary to fall. Patient states that approximately 2 days ago she was walking down the stairs and fell down 8 brick steps on her right side. Patient states that she was not dizzy or lightheaded this time. She states that her left hip which she has had hip replacement on sometimes gives way. Patient states that is what happened that caused her to fall. She has been bedbound for the last few days due to pain. Patient does take medication for pain regularly at home but it has been ineffective. Patient received 2 doses of pain medication in the emergency room and still has limited mobility. Emergency provider consult the hospitalist for admission for pain control.  Pt seen for MST. Pt BMI categorized as healthy weight. Pt has not experienced any weight loss. Per chart, pt was 131 lbs on 08/01/2015 and is currently 156 lbs. Pt reports losing 20 lbs about 2 years ago but her weight has been stable for at least 6 months. Pt reports eating BID. Pt denies N/V and abdominal pain associated with eating. Pt has a good appetite. Pt does not take supplements at home and does not want them here. Pt breakfast tray was at bedside. Pt ate all of her breakfast. Pt reports having a cold 4 days PTA which she associated with poor appetite and a decreased intake. Pt has not seen any weight loss but pt does have mild muscle and fat depletion in  her upper body. Suspect this is d/t aging process.Will continue to monitor intake and follow up in case further nutritional interventions are warranted.   NFPE: mild muscle and fat depletion, no edema.   Labs reviewed; K 3.2 mmol/L, Ca 8.8 mg/dl, glucose 106 mg/dl.  Meds reviewed.   Diet Order:  Diet regular Room service appropriate?: Yes; Fluid consistency:: Thin  Skin:  Wound (see comment) (incision L toe)  Last BM:  4/12  Height:   Ht Readings from Last 1 Encounters:  09/28/15 5\' 4"  (1.626 m)    Weight:   Wt Readings from Last 1 Encounters:  09/28/15 156 lb 8.4 oz (71 kg)    Ideal Body Weight:  54.5 kg  BMI:  Body mass index is 26.85 kg/(m^2).  Estimated Nutritional Needs:   Kcal:  1450-1650 (20-23 kcals/kg)  Protein:  70-80 g (1 g/kg)  Fluid:  1.4-1.6 L  EDUCATION NEEDS:   No education needs identified at this time  Brandi [name], [place] Dietetic Intern Pager [phone]

## 2015-09-28 NOTE — Evaluation (Signed)
..Physical Therapy Evaluation Patient Details Name: Brandi [name] MRN: [id number] DOB: 08/20/1945 Today's Date: 09/28/2015   History of Present Illness  Pt adm with rt leg pain after fall down stairs. xrays and CT negative. PMH - Lt THA, back pain, depression/anxiety, lumbar fusion  Clinical Impression  Pt admitted with above diagnosis and presents to PT with functional limitations due to deficits listed below (See PT problem list). Pt needs skilled PT to maximize independence and safety to allow discharge to home with husband and OPPT.      Follow Up Recommendations Outpatient PT    Equipment Recommendations  None recommended by PT    Recommendations for Other Services       Precautions / Restrictions Precautions Precautions: Fall Restrictions Weight Bearing Restrictions: No      Mobility  Bed Mobility               General bed mobility comments: Pt sitting EOB   Transfers Overall transfer level: Needs assistance Equipment used: Straight cane Transfers: Sit to/from Stand Sit to Stand: Min assist         General transfer comment: Assist for balance. Pt with initial posterior lean  Ambulation/Gait Ambulation/Gait assistance: Min assist;Min guard;Supervision Ambulation Distance (Feet): 150 Feet Assistive device: Straight cane;Rolling walker (2 wheeled) Gait Pattern/deviations: Step-through pattern;Decreased stride length;Staggering left;Staggering right Gait velocity: decr Gait velocity interpretation: Below normal speed for age/gender General Gait Details: Initially used cane but pt unsteady and required min A. Then used walker with min guard and progressed to supervision.  Stairs            Wheelchair Mobility    Modified Rankin (Stroke Patients Only)       Balance Overall balance assessment: History of Falls;Needs assistance Sitting-balance support: No upper extremity supported;Feet supported Sitting balance-Leahy Scale: Good     Standing  balance support: Bilateral upper extremity supported Standing balance-Leahy Scale: Poor Standing balance comment: walker and supervision with static standing.                             Pertinent Vitals/Pain Pain Assessment: Faces Faces Pain Scale: Hurts little more Pain Location: RLE Pain Descriptors / Indicators: Grimacing;Guarding Pain Intervention(s): Limited activity within patient's tolerance;Monitored during session    [place] expects to be discharged to:: Private residence Living Arrangements: Spouse/significant other Available Help at Discharge: Family Type of Home: House Home Access: Stairs to enter     Home Layout: Two level Home Equipment: Environmental consultant - 2 wheels;Cane - single point      Prior Function Level of Independence: Independent with assistive device(s)         Comments: Uses cane at times     Hand Dominance        Extremity/Trunk Assessment   Upper Extremity Assessment: Overall WFL for tasks assessed           Lower Extremity Assessment: Generalized weakness         Communication   Communication: No difficulties  Cognition Arousal/Alertness: Awake/alert Behavior During Therapy: WFL for tasks assessed/performed Overall Cognitive Status: No family/caregiver present to determine baseline cognitive functioning (husband in hall on telephone) Area of Impairment: Safety/judgement;Memory     Memory: Decreased short-term memory   Safety/Judgement: Decreased awareness of safety     General Comments: Pt appears to have memory problems.     General Comments      Exercises        Assessment/Plan  PT Assessment All further PT needs can be met in the next venue of care  PT Diagnosis Difficulty walking;Generalized weakness   PT Problem List Decreased strength;Decreased balance;Decreased activity tolerance;Decreased mobility;Decreased cognition  PT Treatment Interventions     PT Goals (Current goals can  be found in the Care Plan section) Acute Rehab PT Goals PT Goal Formulation: All assessment and education complete, DC therapy    Frequency     Barriers to discharge        Co-evaluation               End of Session Equipment Utilized During Treatment: Gait belt Activity Tolerance: Patient tolerated treatment well Patient left: in chair;with call bell/phone within reach;with chair alarm set Nurse Communication: Mobility status    Functional Assessment Tool Used: clinical judgement Functional Limitation: Mobility: Walking and moving around Mobility: Walking and Moving Around Current Status (J2419): At least 1 percent but less than 20 percent impaired, limited or restricted Mobility: Walking and Moving Around Goal Status [phone]): 0 percent impaired, limited or restricted Mobility: Walking and Moving Around Discharge Status [phone]): At least 1 percent but less than 20 percent impaired, limited or restricted    Time: 1150-1209 PT Time Calculation (min) (ACUTE ONLY): 19 min   Charges:   PT Evaluation $PT Eval Low Complexity: 1 Procedure     PT G Codes:   PT G-Codes **NOT FOR INPATIENT CLASS** Functional Assessment Tool Used: clinical judgement Functional Limitation: Mobility: Walking and moving around Mobility: Walking and Moving Around Current Status (T0757): At least 1 percent but less than 20 percent impaired, limited or restricted Mobility: Walking and Moving Around Goal Status [phone]): 0 percent impaired, limited or restricted Mobility: Walking and Moving Around Discharge Status [phone]): At least 1 percent but less than 20 percent impaired, limited or restricted    [facility] 09/28/2015, 1:52 PM [facility] PT [phone]

## 2015-09-28 NOTE — Progress Notes (Signed)
Reviewed discharge paperwork with pt.  PIV removed.  Prescriptions given.  Pt taken to discharge location via wheelchair.

## 2015-10-01 ENCOUNTER — Telehealth: Payer: Self-pay | Admitting: Behavioral Health

## 2015-10-01 NOTE — Telephone Encounter (Signed)
Transition Care Management Follow-up Telephone Call  PCP: Ann Held, DO  Admit date: 09/27/2015 Discharge date: 09/28/2015  Recommendations for Outpatient Follow-up:  1. Follow up with PCP in 2-3 weeks  Discharge Diagnoses:  Principal Problem:  Leg pain Active Problems:  GERD (gastroesophageal reflux disease)  Chronic constipation  Right hip pain  Discharge Condition: Improved   How have you been since you were released from the hospital? Patient stated, " I'm just sore from the fall". "My hip gave out on me & I fell down 8 flights of stairs".   Do you understand why you were in the hospital? yes   Do you understand the discharge instructions? yes   Where were you discharged to? Home with husband.   Items Reviewed:  Medications reviewed: yes  Allergies reviewed: yes  Dietary changes reviewed: yes, regular diet  Referrals reviewed: yes, follow-up with Dr. Carollee Herter   Functional Questionnaire:   Activities of Daily Living (ADLs):   She states they are independent in the following: ambulation, bathing and hygiene, feeding, continence, grooming, toileting and dressing States they require assistance with the following: she voiced ambulating with crutches.   Any transportation issues/concerns?: no   Any patient concerns? no   Confirmed importance and date/time of follow-up visits scheduled yes, 10/04/15 at 11:30 AM.  Provider Appointment booked with Dr. Carollee Herter.  Confirmed with patient if condition begins to worsen call PCP or go to the ER.  Patient was given the office number and encouraged to call back with question or concerns.  : yes

## 2015-10-04 ENCOUNTER — Emergency Department (HOSPITAL_COMMUNITY)
Admission: EM | Admit: 2015-10-04 | Discharge: 2015-10-04 | Disposition: A | Discharge status: Home / Self Care | Payer: Medicare Other | Attending: Emergency Medicine | Admitting: Emergency Medicine

## 2015-10-04 ENCOUNTER — Inpatient Hospital Stay: Payer: Medicare Other | Admitting: Family Medicine

## 2015-10-04 DIAGNOSIS — Y9289 Other specified places as the place of occurrence of the external cause: Secondary | ICD-10-CM | POA: Insufficient documentation

## 2015-10-04 DIAGNOSIS — Y9389 Activity, other specified: Secondary | ICD-10-CM | POA: Diagnosis not present

## 2015-10-04 DIAGNOSIS — S0990XA Unspecified injury of head, initial encounter: Secondary | ICD-10-CM | POA: Diagnosis not present

## 2015-10-04 DIAGNOSIS — W1839XA Other fall on same level, initial encounter: Secondary | ICD-10-CM | POA: Insufficient documentation

## 2015-10-04 DIAGNOSIS — M25572 Pain in left ankle and joints of left foot: Secondary | ICD-10-CM | POA: Diagnosis not present

## 2015-10-04 DIAGNOSIS — Y998 Other external cause status: Secondary | ICD-10-CM | POA: Insufficient documentation

## 2015-10-04 DIAGNOSIS — M545 Low back pain: Secondary | ICD-10-CM | POA: Diagnosis not present

## 2015-10-04 NOTE — ED Notes (Signed)
Pt states she was seen here on Saturday d/t a fall. States that she went to her PCP today who sent her here for a CT scan. States that she has been having headaches and lethargic. Pt is alert and oriented at this time.

## 2015-10-04 NOTE — ED Notes (Signed)
Pt states this was a misunderstanding and a CT was all she needed. States they will call her PCP tomorrow to get the order for CT. Pt and husband are leaving. This RN called CT to ensure there was no order for CT. CT tech states they called the dr office and there was no order.

## 2015-10-05 ENCOUNTER — Telehealth: Payer: Self-pay | Admitting: Family Medicine

## 2015-10-05 NOTE — Telephone Encounter (Signed)
Pt was no show 10/04/15 11:30am for hospital f/u - per notes she went to ED stating she thought she was supposed to go there for a CT scan. 2nd no show + 4 cancellations since 02/2015. Charge or no charge?

## 2015-10-05 NOTE — Telephone Encounter (Signed)
i saw she was in ED-- no charge

## 2015-10-06 ENCOUNTER — Encounter (HOSPITAL_COMMUNITY): Payer: Self-pay | Admitting: Family Medicine

## 2015-10-06 ENCOUNTER — Emergency Department (HOSPITAL_COMMUNITY): Payer: Medicare Other

## 2015-10-06 ENCOUNTER — Emergency Department (HOSPITAL_COMMUNITY)
Admission: EM | Admit: 2015-10-06 | Discharge: 2015-10-06 | Disposition: A | Discharge status: Home / Self Care | Payer: Medicare Other | Attending: Emergency Medicine | Admitting: Emergency Medicine

## 2015-10-06 DIAGNOSIS — Z791 Long term (current) use of non-steroidal anti-inflammatories (NSAID): Secondary | ICD-10-CM | POA: Diagnosis not present

## 2015-10-06 DIAGNOSIS — K219 Gastro-esophageal reflux disease without esophagitis: Secondary | ICD-10-CM | POA: Insufficient documentation

## 2015-10-06 DIAGNOSIS — G8929 Other chronic pain: Secondary | ICD-10-CM | POA: Diagnosis not present

## 2015-10-06 DIAGNOSIS — Z043 Encounter for examination and observation following other accident: Secondary | ICD-10-CM | POA: Insufficient documentation

## 2015-10-06 DIAGNOSIS — G4700 Insomnia, unspecified: Secondary | ICD-10-CM | POA: Diagnosis not present

## 2015-10-06 DIAGNOSIS — S0990XA Unspecified injury of head, initial encounter: Secondary | ICD-10-CM | POA: Diagnosis not present

## 2015-10-06 DIAGNOSIS — W19XXXD Unspecified fall, subsequent encounter: Secondary | ICD-10-CM

## 2015-10-06 DIAGNOSIS — M1990 Unspecified osteoarthritis, unspecified site: Secondary | ICD-10-CM | POA: Diagnosis not present

## 2015-10-06 DIAGNOSIS — W01198D Fall on same level from slipping, tripping and stumbling with subsequent striking against other object, subsequent encounter: Secondary | ICD-10-CM | POA: Insufficient documentation

## 2015-10-06 DIAGNOSIS — Z793 Long term (current) use of hormonal contraceptives: Secondary | ICD-10-CM | POA: Diagnosis not present

## 2015-10-06 DIAGNOSIS — Z86010 Personal history of colonic polyps: Secondary | ICD-10-CM | POA: Diagnosis not present

## 2015-10-06 DIAGNOSIS — F329 Major depressive disorder, single episode, unspecified: Secondary | ICD-10-CM | POA: Insufficient documentation

## 2015-10-06 DIAGNOSIS — F419 Anxiety disorder, unspecified: Secondary | ICD-10-CM | POA: Insufficient documentation

## 2015-10-06 DIAGNOSIS — Z042 Encounter for examination and observation following work accident: Secondary | ICD-10-CM | POA: Diagnosis not present

## 2015-10-06 NOTE — ED Notes (Signed)
Pt ambulated down hall. While walking pt stated "i feel a little dizzy but i think it's because i haven't eaten in days. I just haven't had much of an apitite." When in room husband stated "her feeling dizzy may also be because it's also the first time she has gotten up to walk in days." Pt was however steady while walking with the assistance of a cane.

## 2015-10-06 NOTE — ED Notes (Signed)
Pt sent here by PCP for CT scan. sts had a fall about 1 week ago after her left hip gave out on her. sts she hit head on concrete. Pt seems slightly confused.denies blood thinners.

## 2015-10-06 NOTE — ED Provider Notes (Signed)
CSN: [id number]     Arrival date & time 10/06/15  1808 History   First MD Initiated Contact with Patient 10/06/15 2033     Chief Complaint  Patient presents with  . Fall     (Consider location/radiation/quality/duration/timing/severity/associated sxs/prior Treatment) HPI Brandi [name] is a 70 y.o. female with history of arthritis, chronic back pain, comes in for evaluation after a fall. Patient reports on 4/13, she experienced a mechanical fall, was evaluated in the emergency department for this problem with a negative workup. She reports she was sent here by her PCP for a CT scan of her head. She reports she did hit her head when she fell, but did not lose consciousness. No unusual numbness or weakness, vision changes, otorrhea or rhinorrhea. No anticoagulation. No other medical complaints.  Past Medical History  Diagnosis Date  . Osteoarthritis   . Chronic back pain   . Constipation   . Insomnia     takes Restoril and Trazodone nightly as needed  . Depression     takes Lexapro daily  . Anxiety     takes Valium daily as needed  . GERD (gastroesophageal reflux disease)     takes Zantac daily  . Joint pain   . Joint swelling   . Peripheral edema     occasionally but never been on fluid pill  . Chronic back pain   . History of gastric ulcer   . History of GI bleed   . History of blood transfusion     no abnormal reaction noted  . History of colon polyps     benign  . Nocturia   . Complication of anesthesia 1980s    "I stopped breathing on the table; I was gettin my teeth extracted"   Past Surgical History  Procedure Laterality Date  . Toe surgery Right     "big toe was coming off"  . Lumbar fusion    . Shoulder surgery Left   . Esophagogastroduodenoscopy N/A 06/16/2014    Procedure: ESOPHAGOGASTRODUODENOSCOPY (EGD);  Surgeon: Missy Sabins, MD;  Location: [facility] ENDOSCOPY;  Service: Endoscopy;  Laterality: N/A;  . Colonoscopy    . Wisdom tooth extraction  1980s  . 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 Joint replacement    . Back surgery    . Toe surgery Left     "bone spur"   Family History  Problem Relation Age of Onset  . Cancer Mother     colon cancer  . Cancer Father     colon cancer  . Heart disease Neg Hx   . Cancer Sister 38    died of colon cancer   Social History  Substance Use Topics  . Smoking status: Never Smoker   . Smokeless tobacco: Never Used  . Alcohol Use: No   OB History    No data available     Review of Systems A 10 point review of systems was completed and was negative except for pertinent positives and negatives as mentioned in the history of present illness     Allergies  Aspirin  Home Medications   Prior to Admission medications   Medication Sig Start Date End Date Taking? Authorizing Provider  acetaminophen (TYLENOL) 650 MG CR tablet Take 1,300 mg by mouth every 8 (eight) hours as needed for pain.   Yes Historical Provider,  MD  diazepam (VALIUM) 10 MG tablet TAKE 1 TABLET BY MOUTH EVERY 12 HOURS AS NEEDED FOR ANXIETY 08/31/15  Yes Yvonne R Lowne Chase, DO  diclofenac sodium (VOLTAREN) 1 % GEL APPLY 2 G TOPICALLY 4 (FOUR) TIMES DAILY. 02/05/15  Yes Yvonne R Lowne Chase, DO  estrogen, conjugated,-medroxyprogesterone (PREMPRO) 0.3-1.5 MG tablet Take 1 tablet by mouth daily. 09/05/15  Yes Yvonne R Lowne Chase, DO  Eszopiclone 3 MG TABS Take 3 mg by mouth at bedtime. Reported on 10/01/2015 07/31/15  Yes Historical Provider, MD  oxyCODONE-acetaminophen (PERCOCET) 10-325 MG tablet Take 0.5-1 tablets by mouth every 4 (four) hours as needed for pain. 09/28/15  Yes Donne Hazel, MD  potassium chloride SA (K-DUR,KLOR-CON) 20 MEQ tablet Take 2 tablets (40 mEq total) by mouth daily. Patient taking differently: Take 20 mEq by mouth 2 (two) times daily.  04/27/15  Yes Yvonne R Lowne Chase,  DO  ranitidine (ZANTAC) 150 MG tablet Take 1 tablet (150 mg total) by mouth 2 (two) times daily. Patient taking differently: Take 150 mg by mouth every morning.  11/08/13  Yes Yvonne R Lowne Chase, DO  traZODone (DESYREL) 50 MG tablet TAKE 1 TABLET BY MOUTH AT BEDTIME Patient taking differently: TAKE 1 TABLET BY MOUTH AT BEDTIME as needed for sleep 09/19/15  Yes Yvonne R Lowne Chase, DO  cyclobenzaprine (FLEXERIL) 5 MG tablet Take 1 tablet (5 mg total) by mouth 3 (three) times daily. 09/28/15   Donne Hazel, MD   BP 154/107 mmHg  Pulse 89  Temp(Src) 98.2 F (36.8 C) (Oral)  Resp 18  SpO2 98% Physical Exam  Constitutional: She is oriented to person, place, and time. She appears well-developed and well-nourished.  HENT:  Head: Normocephalic and atraumatic.  Mouth/Throat: Oropharynx is clear and moist.  Eyes: Conjunctivae are normal. Pupils are equal, round, and reactive to light. Right eye exhibits no discharge. Left eye exhibits no discharge. No scleral icterus.  Neck: Neck supple.  Cardiovascular: Normal rate, regular rhythm and normal heart sounds.   Pulmonary/Chest: Effort normal and breath sounds normal. No respiratory distress. She has no wheezes. She has no rales.  Abdominal: Soft. There is no tenderness.  Musculoskeletal: She exhibits no tenderness.  Neurological: She is alert and oriented to person, place, and time.  Cranial Nerves II-XII grossly intact. Motor strength and sensation are baseline. Completes finger to nose coordination movements without difficulty. Ambulates at baseline with cane in the hallway  Skin: Skin is warm and dry. No rash noted.  Psychiatric: She has a normal mood and affect.  Nursing note and vitals reviewed.   ED Course  Procedures (including critical care time) Labs Review Labs Reviewed - No data to display  Imaging Review Ct Head Wo Contrast  10/06/2015  CLINICAL DATA:  Fall.  Head trauma. EXAM: CT HEAD WITHOUT CONTRAST TECHNIQUE: Contiguous  axial images were obtained from the base of the skull through the vertex without intravenous contrast. COMPARISON:  None. FINDINGS: Right posterior scalp 0.8 cm focal soft tissue density near the midline (series 2/image 15), nonspecific, possibly a sebaceous cyst. No evidence of parenchymal hemorrhage or extra-axial fluid collection. No mass lesion, mass effect, or midline shift. No CT evidence of acute infarction. Cerebral volume is age appropriate. No ventriculomegaly. The visualized paranasal sinuses are essentially clear. The mastoid air cells are unopacified. No evidence of calvarial fracture. IMPRESSION: Negative head CT. No evidence of acute intracranial abnormality. No evidence of calvarial fracture. Electronically Signed   By: Janina Mayo.D.  On: 10/06/2015 20:24   I have personally reviewed and evaluated these images and lab results as part of my medical decision-making.   EKG Interpretation None     Filed Vitals:   10/06/15 1818  BP: 154/107  Pulse: 89  Temp: 98.2 F (36.8 C)  TempSrc: Oral  Resp: 18  SpO2: 98%    MDM  Brandi [name] is a 70 y.o. female who was sent by PCP for CT scan of head status post fall that occurred 4/13. On arrival, she is hemodynamically stable and afebrile. She has a nonfocal neuro exam. CT head is unremarkable. Discussed she may follow-up with PCP in the next 2-3 days for reevaluation. No evidence of other acute or emergent pathology at this time. Discussed ED course, results with patient and husband at bedside. They verbalized understanding and agreement with plan. Voice no other questions or concerns at this time. Stable for discharge. Prior to patient discharge, I discussed and reviewed this case with Dr.Knott  Final diagnoses:  Fall, subsequent encounter        Comer Locket, PA-C 10/07/15 0119  Leo Grosser, MD 10/07/15 [phone]

## 2015-10-06 NOTE — Discharge Instructions (Signed)
There does not appear to be an emergent cause for your symptoms at this time. Your CT scan was reassuring and showed no active bleeding or other emergent abnormalities. It is important to follow up with your doctor for reevaluation in the next 2-3 days. Return to ED for new or worsening symptoms as we discussed.

## 2015-10-08 ENCOUNTER — Telehealth: Payer: Self-pay | Admitting: Family Medicine

## 2015-10-08 ENCOUNTER — Ambulatory Visit: Payer: Medicare Other | Admitting: Family Medicine

## 2015-10-08 ENCOUNTER — Other Ambulatory Visit: Payer: Self-pay | Admitting: *Deleted

## 2015-10-08 DIAGNOSIS — Z0289 Encounter for other administrative examinations: Secondary | ICD-10-CM

## 2015-10-09 ENCOUNTER — Other Ambulatory Visit: Payer: Self-pay | Admitting: Family Medicine

## 2015-10-09 ENCOUNTER — Other Ambulatory Visit: Payer: Self-pay

## 2015-10-09 ENCOUNTER — Ambulatory Visit: Payer: Medicare Other | Admitting: Family Medicine

## 2015-10-09 DIAGNOSIS — K219 Gastro-esophageal reflux disease without esophagitis: Secondary | ICD-10-CM

## 2015-10-09 MED ORDER — RANITIDINE HCL 150 MG PO TABS: 150.0000 mg | ORAL_TABLET | Freq: Two times a day (BID) | ORAL | Status: DC

## 2015-10-09 NOTE — Telephone Encounter (Signed)
Pt was no show 10/08/15 2:15pm for follow up appt, 3rd no show, multiple cancellations, charge or no charge?

## 2015-10-09 NOTE — Telephone Encounter (Signed)
charge 

## 2015-10-10 ENCOUNTER — Emergency Department (HOSPITAL_COMMUNITY)
Admission: EM | Admit: 2015-10-10 | Discharge: 2015-10-11 | Disposition: A | Discharge status: Home / Self Care | Payer: Medicare Other | Attending: Emergency Medicine | Admitting: Emergency Medicine

## 2015-10-10 ENCOUNTER — Encounter (HOSPITAL_COMMUNITY): Payer: Self-pay | Admitting: Emergency Medicine

## 2015-10-10 ENCOUNTER — Telehealth: Payer: Self-pay

## 2015-10-10 ENCOUNTER — Encounter: Payer: Self-pay | Admitting: Family Medicine

## 2015-10-10 DIAGNOSIS — M79605 Pain in left leg: Secondary | ICD-10-CM | POA: Insufficient documentation

## 2015-10-10 DIAGNOSIS — G8929 Other chronic pain: Secondary | ICD-10-CM | POA: Diagnosis not present

## 2015-10-10 DIAGNOSIS — F419 Anxiety disorder, unspecified: Secondary | ICD-10-CM | POA: Diagnosis not present

## 2015-10-10 DIAGNOSIS — R21 Rash and other nonspecific skin eruption: Secondary | ICD-10-CM | POA: Insufficient documentation

## 2015-10-10 DIAGNOSIS — Z793 Long term (current) use of hormonal contraceptives: Secondary | ICD-10-CM | POA: Diagnosis not present

## 2015-10-10 DIAGNOSIS — M1990 Unspecified osteoarthritis, unspecified site: Secondary | ICD-10-CM | POA: Diagnosis not present

## 2015-10-10 DIAGNOSIS — F329 Major depressive disorder, single episode, unspecified: Secondary | ICD-10-CM | POA: Insufficient documentation

## 2015-10-10 DIAGNOSIS — K219 Gastro-esophageal reflux disease without esophagitis: Secondary | ICD-10-CM | POA: Diagnosis not present

## 2015-10-10 DIAGNOSIS — Z86010 Personal history of colonic polyps: Secondary | ICD-10-CM | POA: Diagnosis not present

## 2015-10-10 DIAGNOSIS — G4700 Insomnia, unspecified: Secondary | ICD-10-CM | POA: Diagnosis not present

## 2015-10-10 DIAGNOSIS — M79604 Pain in right leg: Secondary | ICD-10-CM | POA: Diagnosis not present

## 2015-10-10 LAB — CBC WITH DIFFERENTIAL/PLATELET
Basophils Absolute: 0 10*3/uL (ref 0.0–0.1)
Basophils Relative: 0 %
Eosinophils Absolute: 0.4 10*3/uL (ref 0.0–0.7)
Eosinophils Relative: 5 %
HCT: 35.3 % — ABNORMAL LOW (ref 36.0–46.0)
Hemoglobin: 11.9 g/dL — ABNORMAL LOW (ref 12.0–15.0)
Lymphocytes Relative: 28 %
Lymphs Abs: 2.2 10*3/uL (ref 0.7–4.0)
MCH: 32.3 pg (ref 26.0–34.0)
MCHC: 33.7 g/dL (ref 30.0–36.0)
MCV: 95.9 fL (ref 78.0–100.0)
Monocytes Absolute: 0.6 10*3/uL (ref 0.1–1.0)
Monocytes Relative: 8 %
Neutro Abs: 4.6 10*3/uL (ref 1.7–7.7)
Neutrophils Relative %: 59 %
Platelets: 497 10*3/uL — ABNORMAL HIGH (ref 150–400)
RBC: 3.68 MIL/uL — ABNORMAL LOW (ref 3.87–5.11)
RDW: 13.9 % (ref 11.5–15.5)
WBC: 7.8 10*3/uL (ref 4.0–10.5)

## 2015-10-10 NOTE — Telephone Encounter (Signed)
Notified pt's spouse of recommendation for ER evaluation to r/o clot / cellulitis, etc. . . and he voices understanding. He will take pt to [facility] ER.

## 2015-10-10 NOTE — ED Provider Notes (Signed)
CSN: [id number]     Arrival date & time 10/10/15  1908 History   First MD Initiated Contact with Patient 10/10/15 2205     Chief Complaint  Patient presents with  . Leg Pain    HPI   70 year old female presents today with a rash to her lower legs. Patient reports that she's had "spots" that often,, are itchy, and then resolve within the same day. Patient notes this is localized to her lower extremities, no other skin involvement. She denies any systemic involvement, fever, chills, nausea, vomiting, chest pain or shortness of breath. Patient denies any history of allergies or hives other than seasonal allergies. Patient denies any abnormal bruising or bleeding throughout her body, no history of the same. Patient reports that she recently started Lunesta and Valium as she has significant anxiety. Patient not taking any blood thinners, no history of coagulopathies or bleeding disorders. She denies any specific pain to her legs, swelling or edema.   Past Medical History  Diagnosis Date  . Osteoarthritis   . Chronic back pain   . Constipation   . Insomnia     takes Restoril and Trazodone nightly as needed  . Depression     takes Lexapro daily  . Anxiety     takes Valium daily as needed  . GERD (gastroesophageal reflux disease)     takes Zantac daily  . Joint pain   . Joint swelling   . Peripheral edema     occasionally but never been on fluid pill  . Chronic back pain   . History of gastric ulcer   . History of GI bleed   . History of blood transfusion     no abnormal reaction noted  . History of colon polyps     benign  . Nocturia   . Complication of anesthesia 1980s    "I stopped breathing on the table; I was gettin my teeth extracted"   Past Surgical History  Procedure Laterality Date  . Toe surgery Right     "big toe was coming off"  . Lumbar fusion    . Shoulder surgery Left   . Esophagogastroduodenoscopy N/A 06/16/2014    Procedure: ESOPHAGOGASTRODUODENOSCOPY (EGD);   Surgeon: Missy Sabins, MD;  Location: [facility] ENDOSCOPY;  Service: Endoscopy;  Laterality: N/A;  . Colonoscopy    . Wisdom tooth extraction  1980s  . 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 Joint replacement    . Back surgery    . Toe surgery Left     "bone spur"   Family History  Problem Relation Age of Onset  . Cancer Mother     colon cancer  . Cancer Father     colon cancer  . Heart disease Neg Hx   . Cancer Sister 57    died of colon cancer   Social History  Substance Use Topics  . Smoking status: Never Smoker   . Smokeless tobacco: Never Used  . Alcohol Use: No   OB History    No data available     Review of Systems  All other systems reviewed and are negative.   Allergies  Aspirin  Home Medications   Prior to Admission medications   Medication Sig Start Date End Date Taking? Authorizing Provider  diclofenac sodium (VOLTAREN) 1 % GEL APPLY 2 G TOPICALLY 4 (FOUR) TIMES DAILY.  Patient taking differently: Apply 2 g topically 4 (four) times daily as needed (PAIN).  02/05/15  Yes Yvonne R Lowne Chase, DO  estrogen, conjugated,-medroxyprogesterone (PREMPRO) 0.3-1.5 MG tablet Take 1 tablet by mouth daily. 09/05/15  Yes Yvonne R Lowne Chase, DO  Eszopiclone 3 MG TABS Take 3 mg by mouth at bedtime. Reported on 10/01/2015 07/31/15  Yes Historical Provider, MD  ranitidine (ZANTAC) 150 MG tablet Take 1 tablet (150 mg total) by mouth 2 (two) times daily. Patient taking differently: Take 150 mg by mouth daily as needed for heartburn.  10/09/15  Yes Yvonne R Lowne Chase, DO  traZODone (DESYREL) 50 MG tablet TAKE 1 TABLET BY MOUTH AT BEDTIME Patient taking differently: TAKE 50 MG BY MOUTH AT BEDTIME AS NEEDED FOR SLEEP 09/19/15  Yes Yvonne R Lowne Chase, DO  cyclobenzaprine (FLEXERIL) 5 MG tablet Take 1 tablet (5 mg total) by mouth 3 (three) times  daily. Patient not taking: Reported on 10/10/2015 09/28/15   Donne Hazel, MD  diazepam (VALIUM) 10 MG tablet TAKE 1 TABLET BY MOUTH EVERY 12 HOURS AS NEEDED FOR ANXIETY Patient not taking: Reported on 10/10/2015 08/31/15   Rosalita Chessman Chase, DO  hydrocortisone cream 1 % Apply to affected area 2 times daily 10/11/15   Okey Regal, PA-C  oxyCODONE-acetaminophen (PERCOCET) 10-325 MG tablet Take 0.5-1 tablets by mouth every 4 (four) hours as needed for pain. Patient not taking: Reported on 10/10/2015 09/28/15   Donne Hazel, MD  potassium chloride SA (K-DUR,KLOR-CON) 20 MEQ tablet Take 2 tablets (40 mEq total) by mouth daily. Patient not taking: Reported on 10/10/2015 04/27/15   Alferd Apa Lowne Chase, DO   BP 149/85 mmHg  Pulse 84  Temp(Src) 98.5 F (36.9 C) (Oral)  Resp 18  SpO2 100%   Physical Exam  Constitutional: She is oriented to person, place, and time. She appears well-developed and well-nourished.  HENT:  Head: Normocephalic and atraumatic.  No lesions noted to the mouth  Eyes: Conjunctivae are normal. Pupils are equal, round, and reactive to light. Right eye exhibits no discharge. Left eye exhibits no discharge. No scleral icterus.  Neck: Normal range of motion. No JVD present. No tracheal deviation present.  Pulmonary/Chest: Effort normal. No stridor.  Musculoskeletal:  No swelling or edema of the lower extremity   Neurological: She is alert and oriented to person, place, and time. Coordination normal.  Skin:  Several areas of hyperpigmentation to lower extremity, no pain ,swelling, bleeding, bruising. Non palpable. No upper extremity, chest, back, or neck.   Psychiatric: She has a normal mood and affect. Her behavior is normal. Judgment and thought content normal.  Nursing note and vitals reviewed.  ED Course  Procedures (including critical care time) Labs Review Labs Reviewed  CBC WITH DIFFERENTIAL/PLATELET - Abnormal; Notable for the following:    RBC 3.68 (*)     Hemoglobin 11.9 (*)    HCT 35.3 (*)    Platelets 497 (*)    All other components within normal limits    Imaging Review No results found. I have personally reviewed and evaluated these images and lab results as part of my medical decision-making.   EKG Interpretation None      MDM   Final diagnoses:  Rash    Labs: cbc  Imaging:  Consults:  Therapeutics:  Discharge Meds:   Assessment/Plan: 70 year old female presents today with complaints of rash. Patient has several areas of what appears to be hyperpigmentation. These are very small areas, I am  uncertain if this is acute or chronic. Patient has no swelling or edema lower extremity, no bruising or bleeding that would indicate bleeding disorder. Patient CBC had no signs of low platelets. Patient reports these are itchy when they come up, but then go away within the same day. Some question if these are small hives, she will be instructed to use Claritin, hydrocortisone cream, follow-up with primary care. Patient verbalized her understanding and agreement today's plan had no further questions or concerns at time of discharge.        Okey Regal, PA-C 10/11/15 0124

## 2015-10-10 NOTE — ED Notes (Signed)
Patient was told to come her by physician due to her missing her appointment for the pain in her legs. Patient is stating the pain is a 7.

## 2015-10-10 NOTE — Telephone Encounter (Signed)
Per Marge, Dr. Damita Dunnings office called and asked for Brandi [name] to call and follow up with patient.  Per office, pt didn't seem like herself.  She seemed confused and talking about things that were irrelevant.  Called patient.  She was alert and oriented x 3.  It was difficult to hear her and speech was slow.  She says that she's doing okay, but is having trouble with her legs.  Notes black discoloration on bilateral legs.  Legs swollen, very painful to touch, and pt says they feel like they're burning.  She has been elevating her legs to help with swelling and says she has an appt with Dr. Mayer Camel tomorrow (10/11/15) morning.   Spoke with husband, he confirmed swelling and discoloration in legs.  Says swelling can be noted in patient's toes and up to her ankles.  Some swelling noted in legs, but very mild. He also says that his wife doesn't seem the same.  States he's noticed the change in his wife for awhile now, long before her recent fall (10/06/15--hit her head, CT Scan-negative).   Her memory is not as sharp, she doesn't eat much, and she rambles a lot.  He's concerned that this may be due to her abusing pain medication, but is uncertain.  He is very concerned about his wife and wants to know what's going on.  Per husband, pt had hip surgery 6 weeks ago, per chart: Total Hip Placement on 02/26/15  Called Dr. Damita Dunnings office to verify appt scheduled.  Appt was confirmed.  She has an appt with Dr. Mayer Camel tomorrow at 10 am.  Office has agreed to call Dr. Etter Sjogren with assessment findings after visit.    Spoke to [place] about the situation in Dr. Nonda Lou absence.  She advised that pt be seen in the ER today for an evaluation.  Tried calling x 3.  No answer.

## 2015-10-10 NOTE — Telephone Encounter (Signed)
Marked to charge and mailing no show letter °

## 2015-10-11 MED ORDER — HYDROCORTISONE 1 % EX CREA: TOPICAL_CREAM | CUTANEOUS | Status: DC

## 2015-10-11 NOTE — Discharge Instructions (Signed)
Please use medications as directed. Follow up with your primary care provider for further evaluation and management.

## 2015-10-11 NOTE — Telephone Encounter (Signed)
Noted.  Pt's next appt 10/15/15.    Messages routed to Dr. Etter Sjogren.

## 2015-10-11 NOTE — Telephone Encounter (Signed)
Joanell Rising called from [facility] and said the patient was seen in their office today, patient see normal to them.

## 2015-10-14 NOTE — Telephone Encounter (Signed)
noted 

## 2015-10-15 ENCOUNTER — Other Ambulatory Visit: Payer: Self-pay | Admitting: Family Medicine

## 2015-10-15 ENCOUNTER — Ambulatory Visit (INDEPENDENT_AMBULATORY_CARE_PROVIDER_SITE_OTHER): Payer: Medicare Other | Admitting: Family Medicine

## 2015-10-15 ENCOUNTER — Encounter: Payer: Self-pay | Admitting: Family Medicine

## 2015-10-15 VITALS — BP 138/86 | HR 95 | Temp 97.8°F | Ht 64.0 in | Wt 138.6 lb

## 2015-10-15 DIAGNOSIS — G4700 Insomnia, unspecified: Secondary | ICD-10-CM

## 2015-10-15 DIAGNOSIS — Y92009 Unspecified place in unspecified non-institutional (private) residence as the place of occurrence of the external cause: Secondary | ICD-10-CM

## 2015-10-15 DIAGNOSIS — W19XXXA Unspecified fall, initial encounter: Secondary | ICD-10-CM | POA: Diagnosis not present

## 2015-10-15 DIAGNOSIS — Y92099 Unspecified place in other non-institutional residence as the place of occurrence of the external cause: Secondary | ICD-10-CM

## 2015-10-15 MED ORDER — TEMAZEPAM 15 MG PO CAPS: ORAL_CAPSULE | ORAL | Status: DC

## 2015-10-15 MED FILL — OXYCODONE-APAP 10-325 TAB: 10-325 | 4 days supply | Qty: 20 | Fill #0

## 2015-10-15 NOTE — Progress Notes (Signed)
Patient ID: NYSA BROSZ, female    DOB: [birth year]  Age: 70 y.o. MRN: [id number]    Subjective:  Subjective HPI Brandi [name] presents for f/u from hospital for s/p fall and L surgery and b/l foot surgery.  Surgeon is giving her pain meds.  Pt was also still taking valium, temazapam and trazadone.  Er pa sent message that husband was concerned about all her pain meds.  We are not writing pain meds but can cut down on other controlled substances .  We have discussed this with the patient before and she is willing to cut down.   Pt states when she fell she had not taken any meds.  She has discussed issue with her husband.    Review of Systems  Constitutional: Negative for diaphoresis, appetite change, fatigue and unexpected weight change.  Eyes: Negative for pain, redness and visual disturbance.  Respiratory: Negative for cough, chest tightness, shortness of breath and wheezing.   Cardiovascular: Negative for chest pain, palpitations and leg swelling.  Endocrine: Negative for cold intolerance, heat intolerance, polydipsia, polyphagia and polyuria.  Genitourinary: Negative for dysuria, frequency and difficulty urinating.  Neurological: Negative for dizziness, light-headedness, numbness and headaches.    History Past Medical History  Diagnosis Date  . Osteoarthritis   . Chronic back pain   . Constipation   . Insomnia     takes Restoril and Trazodone nightly as needed  . Depression     takes Lexapro daily  . Anxiety     takes Valium daily as needed  . GERD (gastroesophageal reflux disease)     takes Zantac daily  . Joint pain   . Joint swelling   . Peripheral edema     occasionally but never been on fluid pill  . Chronic back pain   . History of gastric ulcer   . History of GI bleed   . History of blood transfusion     no abnormal reaction noted  . History of colon polyps     benign  . Nocturia   . Complication of anesthesia 1980s    "I stopped breathing on the table; I was  gettin my teeth extracted"    She has past surgical history that includes Toe Surgery (Right); Lumbar fusion; Shoulder surgery (Left); Esophagogastroduodenoscopy (N/A, 06/16/2014); Colonoscopy; Wisdom tooth extraction (1980s); Total hip arthroplasty (Left, 02/26/2015); Joint replacement; Back surgery; and Toe Surgery (Left).   Her family history includes Cancer in her father and mother; Cancer (age of onset: 49) in her sister. There is no history of Heart disease.She reports that she has never smoked. She has never used smokeless tobacco. She reports that she does not drink alcohol or use illicit drugs.  Current Outpatient Prescriptions on File Prior to Visit  Medication Sig Dispense Refill  . cyclobenzaprine (FLEXERIL) 5 MG tablet Take 1 tablet (5 mg total) by mouth 3 (three) times daily. 30 tablet 0  . diclofenac sodium (VOLTAREN) 1 % GEL APPLY 2 G TOPICALLY 4 (FOUR) TIMES DAILY. (Patient taking differently: Apply 2 g topically 4 (four) times daily as needed (PAIN). ) 100 g 3  . estrogen, conjugated,-medroxyprogesterone (PREMPRO) 0.3-1.5 MG tablet Take 1 tablet by mouth daily. 28 tablet 3  . Eszopiclone 3 MG TABS Take 3 mg by mouth at bedtime. Reported on 10/01/2015  3  . hydrocortisone cream 1 % Apply to affected area 2 times daily 15 g 0  . oxyCODONE-acetaminophen (PERCOCET) 10-325 MG tablet Take 0.5-1 tablets by mouth every 4 (  four) hours as needed for pain. 20 tablet 0  . potassium chloride SA (K-DUR,KLOR-CON) 20 MEQ tablet Take 2 tablets (40 mEq total) by mouth daily. 30 tablet 2  . ranitidine (ZANTAC) 150 MG tablet Take 1 tablet (150 mg total) by mouth 2 (two) times daily. (Patient taking differently: Take 150 mg by mouth daily as needed for heartburn. ) 180 tablet 3   No current facility-administered medications on file prior to visit.     Objective:  Objective Physical Exam  Constitutional: She is oriented to person, place, and time. She appears well-developed and well-nourished.    HENT:  Head: Normocephalic and atraumatic.  Eyes: Conjunctivae and EOM are normal.  Neck: Normal range of motion. Neck supple. No JVD present. Carotid bruit is not present. No thyromegaly present.  Cardiovascular: Normal rate, regular rhythm and normal heart sounds.   No murmur heard. Pulmonary/Chest: Effort normal and breath sounds normal. No respiratory distress. She has no wheezes. She has no rales. She exhibits no tenderness.  Musculoskeletal: She exhibits no edema.  Neurological: She is alert and oriented to person, place, and time.  Psychiatric: She has a normal mood and affect. Her behavior is normal. Thought content normal.  Nursing note and vitals reviewed.  BP 138/86 mmHg  Pulse 95  Temp(Src) 97.8 F (36.6 C) (Oral)  Ht 5\' 4"  (1.626 m)  Wt 138 lb 9.6 oz (62.869 kg)  BMI 23.78 kg/m2  SpO2 98% Wt Readings from Last 3 Encounters:  10/15/15 138 lb 9.6 oz (62.869 kg)  09/28/15 156 lb 8.4 oz (71 kg)  09/04/15 140 lb 6.4 oz (63.685 kg)     Lab Results  Component Value Date   WBC 7.8 10/10/2015   HGB 11.9* 10/10/2015   HCT 35.3* 10/10/2015   PLT 497* 10/10/2015   GLUCOSE 106* 09/28/2015   ALT 6 09/04/2015   AST 12 09/04/2015   NA 141 09/28/2015   K 3.2* 09/28/2015   CL 105 09/28/2015   CREATININE 0.92 09/28/2015   BUN 6 09/28/2015   CO2 26 09/28/2015   TSH 0.58 09/04/2015   INR 1.12 02/15/2015    No results found.   Assessment & Plan:  Plan I have discontinued Ms. Dignan's diazepam and traZODone. I am also having her start on temazepam. Additionally, I am having her maintain her diclofenac sodium, potassium chloride SA, estrogen (conjugated)-medroxyprogesterone, cyclobenzaprine, oxyCODONE-acetaminophen, Eszopiclone, ranitidine, and hydrocortisone cream.  Meds ordered this encounter  Medications  . temazepam (RESTORIL) 15 MG capsule    Sig: 1-2 po qhs prn    Dispense:  60 capsule    Refill:  0    Problem List Items Addressed This Visit       Unprioritized   Insomnia - Primary    D/c valium and trazadone Will con't restoril 15 mg   1-2 po qhs prn       Relevant Medications   temazepam (RESTORIL) 15 MG capsule   Fall at home    Discussed meds with pt---  Pt still on pain meds per ortho We are not giving her any pain meds F/u ortho         Follow-up: Return in about 3 months (around 01/15/2016) for lab-- recheck potassium and cbc.  Ann Held, DO

## 2015-10-15 NOTE — Telephone Encounter (Signed)
Last seen 09/04/15 and filled 09/19/15 #30  Please advise   KP

## 2015-10-15 NOTE — Progress Notes (Signed)
Pre visit review using our clinic review tool, if applicable. No additional management support is needed unless otherwise documented below in the visit note. 

## 2015-10-15 NOTE — Assessment & Plan Note (Signed)
D/c valium and trazadone Will con't restoril 15 mg   1-2 po qhs prn

## 2015-10-15 NOTE — Patient Instructions (Signed)
Insomnia Insomnia is a sleep disorder that makes it difficult to fall asleep or to stay asleep. Insomnia can cause tiredness (fatigue), low energy, difficulty concentrating, mood swings, and poor performance at work or school.  There are three different ways to classify insomnia:  Difficulty falling asleep.  Difficulty staying asleep.  Waking up too early in the morning. Any type of insomnia can be long-term (chronic) or short-term (acute). Both are common. Short-term insomnia usually lasts for three months or less. Chronic insomnia occurs at least three times a week for longer than three months. CAUSES  Insomnia may be caused by another condition, situation, or substance, such as:  Anxiety.  Certain medicines.  Gastroesophageal reflux disease (GERD) or other gastrointestinal conditions.  Asthma or other breathing conditions.  Restless legs syndrome, sleep apnea, or other sleep disorders.  Chronic pain.  Menopause. This may include hot flashes.  Stroke.  Abuse of alcohol, tobacco, or illegal drugs.  Depression.  Caffeine.   Neurological disorders, such as Alzheimer disease.  An overactive thyroid (hyperthyroidism). The cause of insomnia may not be known. RISK FACTORS Risk factors for insomnia include:  Gender. Women are more commonly affected than men.  Age. Insomnia is more common as you get older.  Stress. This may involve your professional or personal life.  Income. Insomnia is more common in people with lower income.  Lack of exercise.   Irregular work schedule or night shifts.  Traveling between different time zones. SIGNS AND SYMPTOMS If you have insomnia, trouble falling asleep or trouble staying asleep is the main symptom. This may lead to other symptoms, such as:  Feeling fatigued.  Feeling nervous about going to sleep.  Not feeling rested in the morning.  Having trouble concentrating.  Feeling irritable, anxious, or depressed. TREATMENT   Treatment for insomnia depends on the cause. If your insomnia is caused by an underlying condition, treatment will focus on addressing the condition. Treatment may also include:   Medicines to help you sleep.  Counseling or therapy.  Lifestyle adjustments. HOME CARE INSTRUCTIONS   Take medicines only as directed by your health care provider.  Keep regular sleeping and waking hours. Avoid naps.  Keep a sleep diary to help you and your health care provider figure out what could be causing your insomnia. Include:   When you sleep.  When you wake up during the night.  How well you sleep.   How rested you feel the next day.  Any side effects of medicines you are taking.  What you eat and drink.   Make your bedroom a comfortable place where it is easy to fall asleep:  Put up shades or special blackout curtains to block light from outside.  Use a white noise machine to block noise.  Keep the temperature cool.   Exercise regularly as directed by your health care provider. Avoid exercising right before bedtime.  Use relaxation techniques to manage stress. Ask your health care provider to suggest some techniques that may work well for you. These may include:  Breathing exercises.  Routines to release muscle tension.  Visualizing peaceful scenes.  Cut back on alcohol, caffeinated beverages, and cigarettes, especially close to bedtime. These can disrupt your sleep.  Do not overeat or eat spicy foods right before bedtime. This can lead to digestive discomfort that can make it hard for you to sleep.  Limit screen use before bedtime. This includes:  Watching TV.  Using your smartphone, tablet, and computer.  Stick to a routine. This   can help you fall asleep faster. Try to do a quiet activity, brush your teeth, and go to bed at the same time each night.  Get out of bed if you are still awake after 15 minutes of trying to sleep. Keep the lights down, but try reading or  doing a quiet activity. When you feel sleepy, go back to bed.  Make sure that you drive carefully. Avoid driving if you feel very sleepy.  Keep all follow-up appointments as directed by your health care provider. This is important. SEEK MEDICAL CARE IF:   You are tired throughout the day or have trouble in your daily routine due to sleepiness.  You continue to have sleep problems or your sleep problems get worse. SEEK IMMEDIATE MEDICAL CARE IF:   You have serious thoughts about hurting yourself or someone else.   This information is not intended to replace advice given to you by your health care provider. Make sure you discuss any questions you have with your health care provider.   Document Released: 05/30/2000 Document Revised: 02/21/2015 Document Reviewed: 03/03/2014 Elsevier Interactive Patient Education 2016 [employer]vier Inc.  

## 2015-10-21 DIAGNOSIS — W19XXXA Unspecified fall, initial encounter: Secondary | ICD-10-CM | POA: Insufficient documentation

## 2015-10-21 DIAGNOSIS — Y92009 Unspecified place in unspecified non-institutional (private) residence as the place of occurrence of the external cause: Secondary | ICD-10-CM | POA: Insufficient documentation

## 2015-10-21 NOTE — Assessment & Plan Note (Signed)
Discussed meds with pt---  Pt still on pain meds per ortho We are not giving her any pain meds F/u ortho

## 2015-11-05 ENCOUNTER — Other Ambulatory Visit: Payer: Self-pay | Admitting: Family Medicine

## 2015-11-06 ENCOUNTER — Encounter: Payer: Self-pay | Admitting: Family Medicine

## 2015-11-06 ENCOUNTER — Telehealth: Payer: Self-pay | Admitting: Family Medicine

## 2015-11-06 ENCOUNTER — Ambulatory Visit (INDEPENDENT_AMBULATORY_CARE_PROVIDER_SITE_OTHER): Payer: Medicare Other | Admitting: Family Medicine

## 2015-11-06 VITALS — BP 158/96 | HR 94 | Temp 97.8°F | Ht 64.0 in | Wt 136.8 lb

## 2015-11-06 DIAGNOSIS — K921 Melena: Secondary | ICD-10-CM | POA: Diagnosis not present

## 2015-11-06 DIAGNOSIS — R300 Dysuria: Secondary | ICD-10-CM

## 2015-11-06 DIAGNOSIS — F411 Generalized anxiety disorder: Secondary | ICD-10-CM | POA: Diagnosis not present

## 2015-11-06 LAB — POCT URINALYSIS DIPSTICK
Bilirubin, UA: NEGATIVE
Blood, UA: NEGATIVE
GLUCOSE UA: NEGATIVE
KETONES UA: 3.5
LEUKOCYTES UA: NEGATIVE
Nitrite, UA: NEGATIVE
Protein, UA: NEGATIVE
Spec Grav, UA: 1.015
UROBILINOGEN UA: NEGATIVE
pH, UA: 7.5

## 2015-11-06 MED ORDER — DIAZEPAM 10 MG PO TABS: ORAL_TABLET | ORAL | Status: DC

## 2015-11-06 NOTE — Telephone Encounter (Signed)
Error

## 2015-11-06 NOTE — Patient Instructions (Signed)
Generalized Anxiety Disorder Generalized anxiety disorder (GAD) is a mental disorder. It interferes with life functions, including relationships, work, and school. GAD is different from normal anxiety, which everyone experiences at some point in their lives in response to specific life events and activities. Normal anxiety actually helps us prepare for and get through these life events and activities. Normal anxiety goes away after the event or activity is over.  GAD causes anxiety that is not necessarily related to specific events or activities. It also causes excess anxiety in proportion to specific events or activities. The anxiety associated with GAD is also difficult to control. GAD can vary from mild to severe. People with severe GAD can have intense waves of anxiety with physical symptoms (panic attacks).  SYMPTOMS The anxiety and worry associated with GAD are difficult to control. This anxiety and worry are related to many life events and activities and also occur more days than not for 6 months or longer. People with GAD also have three or more of the following symptoms (one or more in children):  Restlessness.   Fatigue.  Difficulty concentrating.   Irritability.  Muscle tension.  Difficulty sleeping or unsatisfying sleep. DIAGNOSIS GAD is diagnosed through an assessment by your health care provider. Your health care provider will ask you questions aboutyour mood,physical symptoms, and events in your life. Your health care provider may ask you about your medical history and use of alcohol or drugs, including prescription medicines. Your health care provider may also do a physical exam and blood tests. Certain medical conditions and the use of certain substances can cause symptoms similar to those associated with GAD. Your health care provider may refer you to a mental health specialist for further evaluation. TREATMENT The following therapies are usually used to treat GAD:    Medication. Antidepressant medication usually is prescribed for long-term daily control. Antianxiety medicines may be added in severe cases, especially when panic attacks occur.   Talk therapy (psychotherapy). Certain types of talk therapy can be helpful in treating GAD by providing support, education, and guidance. A form of talk therapy called cognitive behavioral therapy can teach you healthy ways to think about and react to daily life events and activities.  Stress managementtechniques. These include yoga, meditation, and exercise and can be very helpful when they are practiced regularly. A mental health specialist can help determine which treatment is best for you. Some people see improvement with one therapy. However, other people require a combination of therapies.   This information is not intended to replace advice given to you by your health care provider. Make sure you discuss any questions you have with your health care provider.   Document Released: 09/27/2012 Document Revised: 06/23/2014 Document Reviewed: 09/27/2012 Elsevier Interactive Patient Education 2016 Elsevier Inc.  

## 2015-11-06 NOTE — Progress Notes (Signed)
Patient ID: Brandi [name], female    DOB: [birth year]  Age: 70 y.o. MRN: [id number]    Subjective:  Subjective HPI Brandi [name] presents for c/o dysuria-- she never went to urology and needs a new referral .      Review of Systems  Constitutional: Negative for diaphoresis, appetite change, fatigue and unexpected weight change.  Eyes: Negative for pain, redness and visual disturbance.  Respiratory: Negative for cough, chest tightness, shortness of breath and wheezing.   Cardiovascular: Negative for chest pain, palpitations and leg swelling.  Endocrine: Negative for cold intolerance, heat intolerance, polydipsia, polyphagia and polyuria.  Genitourinary: Negative for dysuria, frequency and difficulty urinating.  Neurological: Negative for dizziness, light-headedness, numbness and headaches.    History Past Medical History  Diagnosis Date  . Osteoarthritis   . Chronic back pain   . Constipation   . Insomnia     takes Restoril and Trazodone nightly as needed  . Depression     takes Lexapro daily  . Anxiety     takes Valium daily as needed  . GERD (gastroesophageal reflux disease)     takes Zantac daily  . Joint pain   . Joint swelling   . Peripheral edema     occasionally but never been on fluid pill  . Chronic back pain   . History of gastric ulcer   . History of GI bleed   . History of blood transfusion     no abnormal reaction noted  . History of colon polyps     benign  . Nocturia   . Complication of anesthesia 1980s    "I stopped breathing on the table; I was gettin my teeth extracted"    She has past surgical history that includes Toe Surgery (Right); Lumbar fusion; Shoulder surgery (Left); Esophagogastroduodenoscopy (N/A, 06/16/2014); Colonoscopy; Wisdom tooth extraction (1980s); Total hip arthroplasty (Left, 02/26/2015); Joint replacement; Back surgery; and Toe Surgery (Left).   Her family history includes Cancer in her father and mother; Cancer (age of onset: 80) in  her sister. There is no history of Heart disease.She reports that she has never smoked. She has never used smokeless tobacco. She reports that she does not drink alcohol or use illicit drugs.  Current Outpatient Prescriptions on File Prior to Visit  Medication Sig Dispense Refill  . cyclobenzaprine (FLEXERIL) 5 MG tablet Take 1 tablet (5 mg total) by mouth 3 (three) times daily. 30 tablet 0  . diclofenac sodium (VOLTAREN) 1 % GEL APPLY 2 G TOPICALLY 4 (FOUR) TIMES DAILY. (Patient taking differently: Apply 2 g topically 4 (four) times daily as needed (PAIN). ) 100 g 3  . estrogen, conjugated,-medroxyprogesterone (PREMPRO) 0.3-1.5 MG tablet Take 1 tablet by mouth daily. 28 tablet 3  . Eszopiclone 3 MG TABS Take 3 mg by mouth at bedtime. Reported on 10/01/2015  3  . hydrocortisone cream 1 % Apply to affected area 2 times daily 15 g 0  . oxyCODONE-acetaminophen (PERCOCET) 10-325 MG tablet Take 0.5-1 tablets by mouth every 4 (four) hours as needed for pain. 20 tablet 0  . potassium chloride SA (K-DUR,KLOR-CON) 20 MEQ tablet Take 2 tablets (40 mEq total) by mouth daily. 30 tablet 2  . ranitidine (ZANTAC) 150 MG tablet Take 1 tablet (150 mg total) by mouth 2 (two) times daily. (Patient taking differently: Take 150 mg by mouth daily as needed for heartburn. ) 180 tablet 3  . temazepam (RESTORIL) 15 MG capsule 1-2 po qhs prn 60 capsule 0  .  traZODone (DESYREL) 50 MG tablet TAKE 1 TABLET BY MOUTH AT BEDTIME 30 tablet 0   No current facility-administered medications on file prior to visit.     Objective:  Objective Physical Exam  Constitutional: She is oriented to person, place, and time. She appears well-developed and well-nourished.  HENT:  Head: Normocephalic and atraumatic.  Eyes: Conjunctivae and EOM are normal.  Neck: Normal range of motion. Neck supple. No JVD present. Carotid bruit is not present. No thyromegaly present.  Cardiovascular: Normal rate, regular rhythm and normal heart sounds.     No murmur heard. Pulmonary/Chest: Effort normal and breath sounds normal. No respiratory distress. She has no wheezes. She has no rales. She exhibits no tenderness.  Musculoskeletal: She exhibits no edema.  Neurological: She is alert and oriented to person, place, and time.  Psychiatric: She has a normal mood and affect. Her behavior is normal. Judgment and thought content normal.  Nursing note and vitals reviewed.  BP 158/96 mmHg  Pulse 94  Temp(Src) 97.8 F (36.6 C) (Oral)  Ht 5\' 4"  (1.626 m)  Wt 136 lb 12.8 oz (62.052 kg)  BMI 23.47 kg/m2  SpO2 98% Wt Readings from Last 3 Encounters:  11/06/15 136 lb 12.8 oz (62.052 kg)  10/15/15 138 lb 9.6 oz (62.869 kg)  09/28/15 156 lb 8.4 oz (71 kg)     Lab Results  Component Value Date   WBC 7.8 10/10/2015   HGB 11.9* 10/10/2015   HCT 35.3* 10/10/2015   PLT 497* 10/10/2015   GLUCOSE 106* 09/28/2015   ALT 6 09/04/2015   AST 12 09/04/2015   NA 141 09/28/2015   K 3.2* 09/28/2015   CL 105 09/28/2015   CREATININE 0.92 09/28/2015   BUN 6 09/28/2015   CO2 26 09/28/2015   TSH 0.58 09/04/2015   INR 1.12 02/15/2015    No results found.   Assessment & Plan:  Plan I am having Ms. Hughett maintain her diclofenac sodium, potassium chloride SA, estrogen (conjugated)-medroxyprogesterone, cyclobenzaprine, oxyCODONE-acetaminophen, Eszopiclone, ranitidine, hydrocortisone cream, traZODone, temazepam, and diazepam.  Meds ordered this encounter  Medications  . DISCONTD: diazepam (VALIUM) 10 MG tablet    Sig: **FILL 4/6**TAKE 1 TABLET BY MOUTH EVERY 12 HOURS AS NEEDED FOR ANXIETY    Refill:  1  . diazepam (VALIUM) 10 MG tablet    Sig: **FILL 4/6**TAKE 1 TABLET BY MOUTH EVERY 12 HOURS AS NEEDED FOR ANXIETY    Dispense:  30 tablet    Refill:  1    Problem List Items Addressed This Visit    None    Visit Diagnoses    Generalized anxiety disorder    -  Primary    Relevant Medications    diazepam (VALIUM) 10 MG tablet    Dysuria         Relevant Orders    Ambulatory referral to Urology    POCT Urinalysis Dipstick (Completed)    Black stool        Relevant Orders    Fecal occult blood, imunochemical       Follow-up: Return if symptoms worsen or fail to improve.  Ann Held, DO

## 2015-11-07 ENCOUNTER — Telehealth: Payer: Self-pay | Admitting: Family Medicine

## 2015-11-07 NOTE — Telephone Encounter (Signed)
Caller name:Holz, Langley Gauss H Relation to pt: self Call back number:[phone]   Reason for call:  Patient wanted to inform PCP that Dr. Farrel Gordon is pleased with the care provided from you. In addition wanted to inform you how pleased she is with the office staff and [employer] don't work to hard.

## 2015-11-20 DIAGNOSIS — M1611 Unilateral primary osteoarthritis, right hip: Secondary | ICD-10-CM | POA: Diagnosis not present

## 2015-11-20 DIAGNOSIS — M25572 Pain in left ankle and joints of left foot: Secondary | ICD-10-CM | POA: Diagnosis not present

## 2015-11-23 ENCOUNTER — Ambulatory Visit (HOSPITAL_BASED_OUTPATIENT_CLINIC_OR_DEPARTMENT_OTHER)
Admission: RE | Admit: 2015-11-23 | Discharge: 2015-11-23 | Disposition: A | Discharge status: Home / Self Care | Payer: Medicare Other | Source: Ambulatory Visit | Attending: Family Medicine | Admitting: Family Medicine

## 2015-11-23 ENCOUNTER — Encounter: Payer: Self-pay | Admitting: Family Medicine

## 2015-11-23 ENCOUNTER — Ambulatory Visit (INDEPENDENT_AMBULATORY_CARE_PROVIDER_SITE_OTHER): Payer: Medicare Other | Admitting: Family Medicine

## 2015-11-23 VITALS — BP 170/108 | HR 88 | Temp 98.3°F | Ht 64.0 in | Wt 146.2 lb

## 2015-11-23 DIAGNOSIS — R601 Generalized edema: Secondary | ICD-10-CM

## 2015-11-23 DIAGNOSIS — R197 Diarrhea, unspecified: Secondary | ICD-10-CM

## 2015-11-23 DIAGNOSIS — R918 Other nonspecific abnormal finding of lung field: Secondary | ICD-10-CM | POA: Diagnosis not present

## 2015-11-23 DIAGNOSIS — I517 Cardiomegaly: Secondary | ICD-10-CM | POA: Diagnosis not present

## 2015-11-23 LAB — BASIC METABOLIC PANEL
BUN: 7 mg/dL (ref 6–23)
CHLORIDE: 106 meq/L (ref 96–112)
CO2: 28 mEq/L (ref 19–32)
Calcium: 9.4 mg/dL (ref 8.4–10.5)
Creatinine, Ser: 1.08 mg/dL (ref 0.40–1.20)
GFR: 64.87 mL/min (ref >60.00)
Glucose, Bld: 92 mg/dL (ref 70–99)
POTASSIUM: 3.4 meq/L — AB (ref 3.5–5.1)
SODIUM: 140 meq/L (ref 135–145)

## 2015-11-23 MED ORDER — HYDROCHLOROTHIAZIDE 25 MG PO TABS: 25.0000 mg | ORAL_TABLET | Freq: Every day | ORAL | Status: DC

## 2015-11-23 MED ORDER — HYOSCYAMINE SULFATE 0.125 MG SL SUBL: 0.1250 mg | SUBLINGUAL_TABLET | SUBLINGUAL | Status: DC | PRN

## 2015-11-23 NOTE — Progress Notes (Signed)
Pre visit review using our clinic review tool, if applicable. No additional management support is needed unless otherwise documented below in the visit note. 

## 2015-11-23 NOTE — Patient Instructions (Signed)
Edema °Edema is an abnormal buildup of fluids in your body tissues. Edema is somewhat dependent on gravity to pull the fluid to the lowest place in your body. That makes the condition more common in the legs and thighs (lower extremities). Painless swelling of the feet and ankles is common and becomes more likely as you get older. It is also common in looser tissues, like around your eyes.  °When the affected area is squeezed, the fluid may move out of that spot and leave a dent for a few moments. This dent is called pitting.  °CAUSES  °There are many possible causes of edema. Eating too much salt and being on your feet or sitting for a long time can cause edema in your legs and ankles. Hot weather may make edema worse. Common medical causes of edema include: °· Heart failure. °· Liver disease. °· Kidney disease. °· Weak blood vessels in your legs. °· Cancer. °· An injury. °· Pregnancy. °· Some medications. °· Obesity.  °SYMPTOMS  °Edema is usually painless. Your skin may look swollen or shiny.  °DIAGNOSIS  °Your health care provider may be able to diagnose edema by asking about your medical history and doing a physical exam. You may need to have tests such as X-rays, an electrocardiogram, or blood tests to check for medical conditions that may cause edema.  °TREATMENT  °Edema treatment depends on the cause. If you have heart, liver, or kidney disease, you need the treatment appropriate for these conditions. General treatment may include: °· Elevation of the affected body part above the level of your heart. °· Compression of the affected body part. Pressure from elastic bandages or support stockings squeezes the tissues and forces fluid back into the blood vessels. This keeps fluid from entering the tissues. °· Restriction of fluid and salt intake. °· Use of a water pill (diuretic). These medications are appropriate only for some types of edema. They pull fluid out of your body and make you urinate more often. This  gets rid of fluid and reduces swelling, but diuretics can have side effects. Only use diuretics as directed by your health care provider. °HOME CARE INSTRUCTIONS  °· Keep the affected body part above the level of your heart when you are lying down.   °· Do not sit still or stand for prolonged periods.   °· Do not put anything directly under your knees when lying down. °· Do not wear constricting clothing or garters on your upper legs.   °· Exercise your legs to work the fluid back into your blood vessels. This may help the swelling go down.   °· Wear elastic bandages or support stockings to reduce ankle swelling as directed by your health care provider.   °· Eat a low-salt diet to reduce fluid if your health care provider recommends it.   °· Only take medicines as directed by your health care provider.  °SEEK MEDICAL CARE IF:  °· Your edema is not responding to treatment. °· You have heart, liver, or kidney disease and notice symptoms of edema. °· You have edema in your legs that does not improve after elevating them.   °· You have sudden and unexplained weight gain. °SEEK IMMEDIATE MEDICAL CARE IF:  °· You develop shortness of breath or chest pain.   °· You cannot breathe when you lie down. °· You develop pain, redness, or warmth in the swollen areas.   °· You have heart, liver, or kidney disease and suddenly get edema. °· You have a fever and your symptoms suddenly get worse. °MAKE SURE YOU:  °·   Understand these instructions. °· Will watch your condition. °· Will get help right away if you are not doing well or get worse. °  °This information is not intended to replace advice given to you by your health care provider. Make sure you discuss any questions you have with your health care provider. °  °Document Released: 06/02/2005 Document Revised: 06/23/2014 Document Reviewed: 03/25/2013 °Elsevier Interactive Patient Education ©2016 [employer] ° °

## 2015-11-23 NOTE — Progress Notes (Signed)
Patient ID: SHANETHA MENDELL, female    DOB: Aug 30, 1945  Age: 70 y.o. MRN: [id number]    Subjective:  Subjective HPI NOY LILLA presents for edema in low ext -- worsening over last few weeks.  No sob, no cp.    Review of Systems  Constitutional: Negative for diaphoresis, appetite change, fatigue and unexpected weight change.  Eyes: Negative for pain, redness and visual disturbance.  Respiratory: Negative for cough, chest tightness, shortness of breath and wheezing.   Cardiovascular: Positive for leg swelling. Negative for chest pain and palpitations.  Endocrine: Negative for cold intolerance, heat intolerance, polydipsia, polyphagia and polyuria.  Genitourinary: Negative for dysuria, frequency and difficulty urinating.  Neurological: Negative for dizziness, light-headedness, numbness and headaches.    History Past Medical History  Diagnosis Date  . Osteoarthritis   . Chronic back pain   . Constipation   . Insomnia     takes Restoril and Trazodone nightly as needed  . Depression     takes Lexapro daily  . Anxiety     takes Valium daily as needed  . GERD (gastroesophageal reflux disease)     takes Zantac daily  . Joint pain   . Joint swelling   . Peripheral edema     occasionally but never been on fluid pill  . Chronic back pain   . History of gastric ulcer   . History of GI bleed   . History of blood transfusion     no abnormal reaction noted  . History of colon polyps     benign  . Nocturia   . Complication of anesthesia 1980s    "I stopped breathing on the table; I was gettin my teeth extracted"    She has past surgical history that includes Toe Surgery (Right); Lumbar fusion; Shoulder surgery (Left); Esophagogastroduodenoscopy (N/A, 06/16/2014); Colonoscopy; Wisdom tooth extraction (1980s); Total hip arthroplasty (Left, 02/26/2015); Joint replacement; Back surgery; and Toe Surgery (Left).   Her family history includes Cancer in her father and mother; Cancer (age of  onset: 72) in her sister. There is no history of Heart disease.She reports that she has never smoked. She has never used smokeless tobacco. She reports that she does not drink alcohol or use illicit drugs.  Current Outpatient Prescriptions on File Prior to Visit  Medication Sig Dispense Refill  . cyclobenzaprine (FLEXERIL) 5 MG tablet Take 1 tablet (5 mg total) by mouth 3 (three) times daily. 30 tablet 0  . diazepam (VALIUM) 10 MG tablet **FILL 4/6**TAKE 1 TABLET BY MOUTH EVERY 12 HOURS AS NEEDED FOR ANXIETY 30 tablet 1  . diclofenac sodium (VOLTAREN) 1 % GEL APPLY 2 G TOPICALLY 4 (FOUR) TIMES DAILY. (Patient taking differently: Apply 2 g topically 4 (four) times daily as needed (PAIN). ) 100 g 3  . estrogen, conjugated,-medroxyprogesterone (PREMPRO) 0.3-1.5 MG tablet Take 1 tablet by mouth daily. 28 tablet 3  . Eszopiclone 3 MG TABS Take 3 mg by mouth at bedtime. Reported on 10/01/2015  3  . hydrocortisone cream 1 % Apply to affected area 2 times daily 15 g 0  . oxyCODONE-acetaminophen (PERCOCET) 10-325 MG tablet Take 0.5-1 tablets by mouth every 4 (four) hours as needed for pain. 20 tablet 0  . potassium chloride SA (K-DUR,KLOR-CON) 20 MEQ tablet Take 2 tablets (40 mEq total) by mouth daily. 30 tablet 2  . ranitidine (ZANTAC) 150 MG tablet Take 1 tablet (150 mg total) by mouth 2 (two) times daily. (Patient taking differently: Take 150 mg by mouth  daily as needed for heartburn. ) 180 tablet 3  . temazepam (RESTORIL) 15 MG capsule 1-2 po qhs prn 60 capsule 0  . traZODone (DESYREL) 50 MG tablet TAKE 1 TABLET BY MOUTH AT BEDTIME 30 tablet 0   No current facility-administered medications on file prior to visit.     Objective:  Objective Physical Exam  Constitutional: She is oriented to person, place, and time. She appears well-developed and well-nourished.  HENT:  Head: Normocephalic and atraumatic.  Eyes: Conjunctivae and EOM are normal.  Neck: Normal range of motion. Neck supple. No JVD  present. Carotid bruit is not present. No thyromegaly present.  Cardiovascular: Normal rate, regular rhythm and normal heart sounds.   No murmur heard. Pulmonary/Chest: Effort normal and breath sounds normal. No respiratory distress. She has no wheezes. She has no rales. She exhibits no tenderness.  Musculoskeletal: She exhibits edema. She exhibits no tenderness.  Neurological: She is alert and oriented to person, place, and time.  Psychiatric: She has a normal mood and affect. Her behavior is normal.  Nursing note and vitals reviewed.  BP 170/108 mmHg  Pulse 88  Temp(Src) 98.3 F (36.8 C) (Oral)  Ht 5\' 4"  (1.626 m)  Wt 146 lb 3.2 oz (66.316 kg)  BMI 25.08 kg/m2  SpO2 99% Wt Readings from Last 3 Encounters:  11/23/15 146 lb 3.2 oz (66.316 kg)  11/06/15 136 lb 12.8 oz (62.052 kg)  10/15/15 138 lb 9.6 oz (62.869 kg)     Lab Results  Component Value Date   WBC 7.8 10/10/2015   HGB 11.9* 10/10/2015   HCT 35.3* 10/10/2015   PLT 497* 10/10/2015   GLUCOSE 92 11/23/2015   ALT 6 09/04/2015   AST 12 09/04/2015   NA 140 11/23/2015   K 3.4* 11/23/2015   CL 106 11/23/2015   CREATININE 1.08 11/23/2015   BUN 7 11/23/2015   CO2 28 11/23/2015   TSH 0.58 09/04/2015   INR 1.12 02/15/2015    No results found.   Assessment & Plan:  Plan I am having Ms. Martelli start on hydrochlorothiazide and hyoscyamine. I am also having her maintain her diclofenac sodium, potassium chloride SA, estrogen (conjugated)-medroxyprogesterone, cyclobenzaprine, oxyCODONE-acetaminophen, Eszopiclone, ranitidine, hydrocortisone cream, traZODone, temazepam, and diazepam.  Meds ordered this encounter  Medications  . hydrochlorothiazide (HYDRODIURIL) 25 MG tablet    Sig: Take 1 tablet (25 mg total) by mouth daily.    Dispense:  30 tablet    Refill:  11  . hyoscyamine (LEVSIN/SL) 0.125 MG SL tablet    Sig: Place 1 tablet (0.125 mg total) under the tongue every 4 (four) hours as needed.    Dispense:  30 tablet     Refill:  0    Problem List Items Addressed This Visit    None    Visit Diagnoses    Generalized edema    -  Primary    Relevant Medications    hydrochlorothiazide (HYDRODIURIL) 25 MG tablet    Other Relevant Orders    DG Chest 2 View (Completed)    Basic metabolic panel (Completed)    ECHOCARDIOGRAM COMPLETE    Diarrhea, unspecified type        Relevant Medications    hyoscyamine (LEVSIN/SL) 0.125 MG SL tablet    Other Relevant Orders    Stool culture    Clostridium difficile EIA     elevate legs rto 2 weeks or sooner if any sob   Follow-up: Return in about 2 weeks (around 12/07/2015), or if symptoms  worsen or fail to improve, for edema.  Ann Held, DO

## 2015-11-29 ENCOUNTER — Telehealth: Payer: Self-pay | Admitting: Family Medicine

## 2015-11-29 DIAGNOSIS — M7989 Other specified soft tissue disorders: Secondary | ICD-10-CM

## 2015-11-29 NOTE — Telephone Encounter (Signed)
Brandi [name]-- can you ask pt why she is needing podiatry?

## 2015-11-29 NOTE — Telephone Encounter (Signed)
°  Relationship to patient: Self Can be reached: [phone]     Reason for call: Request referral for Podiatry

## 2015-11-30 NOTE — Telephone Encounter (Signed)
Ref placed.      KP 

## 2015-11-30 NOTE — Telephone Encounter (Signed)
Please advise if patient can have referral to Podiatry based on note below.

## 2015-11-30 NOTE — Addendum Note (Signed)
Addended by: Ewing Schlein on: 11/30/2015 05:29 PM   Modules accepted: Orders

## 2015-11-30 NOTE — Telephone Encounter (Signed)
Ok to refer.

## 2015-11-30 NOTE — Telephone Encounter (Signed)
Called patient to find out why she needed a referral to Podiatry. Patient states that she was told by the surgeon that she may need to see Podiatry because the swelling is still in her feet from surgery. States that her surgeon will not refer her but he can not determine why she is still having swelling.

## 2015-12-05 ENCOUNTER — Ambulatory Visit (HOSPITAL_BASED_OUTPATIENT_CLINIC_OR_DEPARTMENT_OTHER): Payer: Medicare Other

## 2015-12-20 DIAGNOSIS — Z96642 Presence of left artificial hip joint: Secondary | ICD-10-CM | POA: Diagnosis not present

## 2015-12-20 DIAGNOSIS — Z09 Encounter for follow-up examination after completed treatment for conditions other than malignant neoplasm: Secondary | ICD-10-CM | POA: Diagnosis not present

## 2015-12-23 ENCOUNTER — Other Ambulatory Visit: Payer: Self-pay | Admitting: Family Medicine

## 2015-12-24 NOTE — Telephone Encounter (Signed)
Last seen 11/23/15 and filled 10/15/15 #30  Please advise    KP

## 2015-12-25 ENCOUNTER — Telehealth: Payer: Self-pay | Admitting: Family Medicine

## 2015-12-25 DIAGNOSIS — G4700 Insomnia, unspecified: Secondary | ICD-10-CM

## 2015-12-25 NOTE — Telephone Encounter (Signed)
°  Relationship to patient: Self  Can be reached: [phone]  Pharmacy:  [id number] - [place], [place] [phone] (Phone) [phone] (Fax)       Reason for call: Request refill on temazepam (RESTORIL) 15 MG capsule

## 2015-12-25 NOTE — Telephone Encounter (Signed)
Last seen 11/23/15 and filled 10/15/15 #60   Please advise    KP

## 2015-12-26 NOTE — ED Provider Notes (Signed)
Medical screening examination/treatment/procedure(s) were performed by non-physician practitioner and as supervising physician I was immediately available for consultation/collaboration.   EKG Interpretation None       Isla Pence, MD 12/26/15 [phone]

## 2015-12-27 NOTE — Telephone Encounter (Signed)
Refill x1 

## 2015-12-28 ENCOUNTER — Ambulatory Visit: Payer: Medicare Other | Admitting: Podiatry

## 2015-12-28 ENCOUNTER — Other Ambulatory Visit: Payer: Self-pay | Admitting: Family Medicine

## 2015-12-28 MED ORDER — TEMAZEPAM 15 MG PO CAPS: ORAL_CAPSULE | ORAL | Status: DC

## 2015-12-28 NOTE — Telephone Encounter (Signed)
Pt called in to request refill. Pt forgot that she had already request medication. Informed her of the below.

## 2015-12-28 NOTE — Telephone Encounter (Signed)
Rx faxed.    KP 

## 2015-12-31 ENCOUNTER — Encounter: Payer: Medicare Other | Admitting: Podiatry

## 2016-01-03 ENCOUNTER — Telehealth (HOSPITAL_COMMUNITY): Payer: Self-pay | Admitting: Family Medicine

## 2016-01-03 NOTE — Telephone Encounter (Signed)
Called pt and spoke with her to see if she wanted to reschedule her echo that was cancelled on 6/21. She voiced that she had another bad fall and was bed- bound. Patient voiced that she would not be having surgery until September and would not be able reschedule the appt until then. I informed her that I would call the doctors office and notify them of this information.

## 2016-01-03 NOTE — Telephone Encounter (Signed)
Called and spoke with Danise Mina at [facility] and informed her of what the patient told me . Danise Mina said it would be ok to remove this from the workqueue and put " patient refusal" or something close to that as far as removal reason.

## 2016-01-07 DIAGNOSIS — R300 Dysuria: Secondary | ICD-10-CM | POA: Diagnosis not present

## 2016-01-14 ENCOUNTER — Telehealth: Payer: Self-pay | Admitting: Family Medicine

## 2016-01-14 DIAGNOSIS — G4700 Insomnia, unspecified: Secondary | ICD-10-CM

## 2016-01-14 NOTE — Telephone Encounter (Signed)
Ok to post date x1 each with no refills

## 2016-01-14 NOTE — Telephone Encounter (Signed)
Pt says that she will be out of town for a few weeks and would like to know if PCP could post date 2 Rx.   traZODone  temazepam      CB: [phone]

## 2016-01-14 NOTE — Telephone Encounter (Signed)
The patient was last seen on 11/23/15 and med's filled  Trazodone 12/24/15 #30  Temazepam  12/28/15 #60  Please advise     KP

## 2016-01-15 ENCOUNTER — Other Ambulatory Visit (INDEPENDENT_AMBULATORY_CARE_PROVIDER_SITE_OTHER): Payer: Medicare Other

## 2016-01-15 DIAGNOSIS — D72829 Elevated white blood cell count, unspecified: Secondary | ICD-10-CM

## 2016-01-15 LAB — CBC WITH DIFFERENTIAL/PLATELET
BASOS ABS: 0 10*3/uL (ref 0.0–0.1)
Basophils Relative: 0.4 % (ref 0.0–3.0)
Eosinophils Absolute: 0.3 10*3/uL (ref 0.0–0.7)
Eosinophils Relative: 3.8 % (ref 0.0–5.0)
HCT: 37 % (ref 36.0–46.0)
HEMOGLOBIN: 12.3 g/dL (ref 12.0–15.0)
LYMPHS ABS: 2 10*3/uL (ref 0.7–4.0)
LYMPHS PCT: 25.6 % (ref 12.0–46.0)
MCHC: 33.1 g/dL (ref 30.0–36.0)
MCV: 93.1 fl (ref 78.0–100.0)
MONOS PCT: 6.5 % (ref 3.0–12.0)
Monocytes Absolute: 0.5 10*3/uL (ref 0.1–1.0)
NEUTROS PCT: 63.7 % (ref 43.0–77.0)
Neutro Abs: 4.9 10*3/uL (ref 1.4–7.7)
Platelets: 403 10*3/uL — ABNORMAL HIGH (ref 150.0–400.0)
RBC: 3.98 Mil/uL (ref 3.87–5.11)
RDW: 16.2 % — ABNORMAL HIGH (ref 11.5–15.5)
WBC: 7.7 10*3/uL (ref 4.0–10.5)

## 2016-01-15 MED ORDER — TEMAZEPAM 15 MG PO CAPS: ORAL_CAPSULE | ORAL | 0 refills | Status: DC

## 2016-01-15 MED ORDER — TRAZODONE HCL 50 MG PO TABS: 50.0000 mg | ORAL_TABLET | Freq: Every day | ORAL | 0 refills | Status: DC

## 2016-01-15 NOTE — Telephone Encounter (Signed)
Pt called in to follow up on medication refill request. Informed pt of the below. Pt says that she will be in to her lab appt this morning at 10:00.

## 2016-01-15 NOTE — Telephone Encounter (Signed)
Med's printed, she will need to come by the office to pick up.   KP

## 2016-01-15 NOTE — Addendum Note (Signed)
Addended by: Ewing Schlein on: 01/15/2016 09:56 AM   Modules accepted: Orders

## 2016-01-17 ENCOUNTER — Encounter: Payer: Self-pay | Admitting: Podiatry

## 2016-01-17 ENCOUNTER — Ambulatory Visit (INDEPENDENT_AMBULATORY_CARE_PROVIDER_SITE_OTHER): Payer: Medicare Other

## 2016-01-17 ENCOUNTER — Encounter: Payer: Medicare Other | Admitting: Podiatry

## 2016-01-17 ENCOUNTER — Ambulatory Visit (INDEPENDENT_AMBULATORY_CARE_PROVIDER_SITE_OTHER): Payer: Medicare Other | Admitting: Podiatry

## 2016-01-17 VITALS — BP 148/94 | HR 74

## 2016-01-17 DIAGNOSIS — M21619 Bunion of unspecified foot: Secondary | ICD-10-CM

## 2016-01-17 DIAGNOSIS — M7989 Other specified soft tissue disorders: Secondary | ICD-10-CM | POA: Diagnosis not present

## 2016-01-17 DIAGNOSIS — M79671 Pain in right foot: Secondary | ICD-10-CM | POA: Diagnosis not present

## 2016-01-17 DIAGNOSIS — L600 Ingrowing nail: Secondary | ICD-10-CM | POA: Diagnosis not present

## 2016-01-17 DIAGNOSIS — M79672 Pain in left foot: Secondary | ICD-10-CM | POA: Diagnosis not present

## 2016-01-17 NOTE — Patient Instructions (Signed)

## 2016-01-17 NOTE — Progress Notes (Signed)
   Subjective:    Patient ID: Brandi [name], female    DOB: 07/19/1945, 70 y.o.   MRN: [id number]  HPI    Review of Systems  Constitutional: Positive for diaphoresis, fatigue and unexpected weight change.  Respiratory: Positive for cough and shortness of breath.   Cardiovascular: Positive for leg swelling.  All other systems reviewed and are negative.      Objective:   Physical Exam        Assessment & Plan:

## 2016-01-17 NOTE — Progress Notes (Signed)
Subjective:     Patient ID: Brandi [name], female   DOB: 1945/11/04, 70 y.o.   MRN: [id number]  HPI patient presents stating that she has had a bunion done in March and she is not happy with the results and also she has a severely thickened third nail right that is tender and she cannot cut and it's painful in shoes   Review of Systems  All other systems reviewed and are negative.      Objective:   Physical Exam  Constitutional: She is oriented to person, place, and time.  Cardiovascular: Intact distal pulses.   Musculoskeletal: Normal range of motion.  Neurological: She is oriented to person, place, and time.  Skin: Skin is warm.  Nursing note and vitals reviewed.  neurovascular status intact muscle strength adequate range of motion within normal limits with patient found to have healed surgical site left but there is some deviation of the big toe and some mild roundness around the first metatarsal head. On the right the third nail severely thickened and dystrophic and it's moderately painful when I pressed from a dorsal direction. Patient rated with the left foot but it still early and I did note good digital perfusion and well oriented 3     Assessment:     4 months after bunion surgery left with swelling still noted and damaged thickened third nail right that's painful    Plan:     H&P and conditions reviewed with patient. Due to the thickness and deformity of the nail and recommended removal and I explained procedure to patient. She wants this done understanding it we'll be permanent and understands risk and today I infiltrated the toe 60 mg Xylocaine Marcaine mixture removed the nail exposed matrix and applied phenol 3 applications 30 seconds followed by alcohol lavaged. For the left I did discuss we can do an osteotomy but I would hold off currently and wait for all swelling and and decide whether or not this would be necessary. Reappoint in 4 months  X-ray evaluation indicates  that there is some increased the IM angle on the left first metatarsal and the right one has been corrected with full reduction of the  IM angle

## 2016-01-21 ENCOUNTER — Telehealth: Payer: Self-pay | Admitting: Family Medicine

## 2016-01-21 DIAGNOSIS — R239 Unspecified skin changes: Secondary | ICD-10-CM

## 2016-01-21 NOTE — Telephone Encounter (Signed)
Ok to refer.

## 2016-01-21 NOTE — Telephone Encounter (Signed)
Relationship to patient: self Can be reached: [phone]  Reason for call: Pt would like a referral to dermatology. She said that she has black and blue blotches/marks on her legs. She states they have been there before. Please refer to [facility].

## 2016-01-21 NOTE — Telephone Encounter (Signed)
Ref placed.      KP 

## 2016-01-21 NOTE — Telephone Encounter (Signed)
Please advise      KP 

## 2016-01-22 ENCOUNTER — Encounter: Payer: Self-pay | Admitting: Family Medicine

## 2016-01-31 ENCOUNTER — Telehealth: Payer: Self-pay | Admitting: *Deleted

## 2016-01-31 NOTE — Telephone Encounter (Signed)
Called patient at [phone] (home #) to check to see how they were doing from when their nail was removed on Thursday, January 17, 2016. Pt stated, "Not as sore as it was and not in any pain". Pt will come back in on May 15, 2016 at 11:15 am for a follow-up with Dr. Paulla Dolly.

## 2016-02-04 DIAGNOSIS — K921 Melena: Secondary | ICD-10-CM | POA: Diagnosis not present

## 2016-02-04 DIAGNOSIS — K625 Hemorrhage of anus and rectum: Secondary | ICD-10-CM | POA: Diagnosis not present

## 2016-02-11 DIAGNOSIS — D2272 Melanocytic nevi of left lower limb, including hip: Secondary | ICD-10-CM | POA: Diagnosis not present

## 2016-02-11 DIAGNOSIS — I872 Venous insufficiency (chronic) (peripheral): Secondary | ICD-10-CM | POA: Diagnosis not present

## 2016-02-11 DIAGNOSIS — D2271 Melanocytic nevi of right lower limb, including hip: Secondary | ICD-10-CM | POA: Diagnosis not present

## 2016-02-14 NOTE — Progress Notes (Signed)
This encounter was created in error - please disregard.

## 2016-02-15 ENCOUNTER — Telehealth: Payer: Self-pay | Admitting: Family Medicine

## 2016-02-15 DIAGNOSIS — G4700 Insomnia, unspecified: Secondary | ICD-10-CM

## 2016-02-15 NOTE — Telephone Encounter (Signed)
scheduled 9/12/147 at 6 pm.   KP

## 2016-02-15 NOTE — Telephone Encounter (Signed)
I reviewed her chart again, and it is too soon to fill the medication. I made the patient aware she is more than a week too soon and she will have to wait until next week to get her med's. She verbalized understanding and has agreed to call back.  KP

## 2016-02-15 NOTE — Telephone Encounter (Signed)
°  Relationship to patient: Self  Can be reached: [phone]  Pharmacy:  Reason for call: Request refill on temazepam (RESTORIL) 15 MG capsule [id number]

## 2016-02-15 NOTE — Telephone Encounter (Signed)
Last seen 11/23/15 and both med's filled on 01/15/16   Temazepam 01/15/16 #60 Trazodone 01/15/16 #30  No UDS  Please advise     KP

## 2016-02-15 NOTE — Telephone Encounter (Signed)
Patient states she needs to come in for a Surgical Clearance the first week of September. No available slots. Plse adv

## 2016-02-15 NOTE — Telephone Encounter (Signed)
Medications routinely refill by PCP thus  okay   No. 30, no refills on each

## 2016-02-19 ENCOUNTER — Other Ambulatory Visit: Payer: Self-pay | Admitting: Family Medicine

## 2016-02-19 ENCOUNTER — Other Ambulatory Visit: Payer: Self-pay | Admitting: Orthopedic Surgery

## 2016-02-21 ENCOUNTER — Other Ambulatory Visit: Payer: Self-pay | Admitting: Family Medicine

## 2016-02-21 DIAGNOSIS — M2012 Hallux valgus (acquired), left foot: Secondary | ICD-10-CM | POA: Diagnosis not present

## 2016-02-21 DIAGNOSIS — F411 Generalized anxiety disorder: Secondary | ICD-10-CM

## 2016-02-21 DIAGNOSIS — M1611 Unilateral primary osteoarthritis, right hip: Secondary | ICD-10-CM | POA: Diagnosis not present

## 2016-02-21 DIAGNOSIS — G4700 Insomnia, unspecified: Secondary | ICD-10-CM

## 2016-02-21 NOTE — Telephone Encounter (Signed)
Relation to [id number] Call back number:[phone]   Reason for call:  Patient requesting an increase diazepam (VALIUM) 10 MG tablet and temazepam (RESTORIL) 15 MG capsule, states she had death in the family and taking it hard patient would like to pick up prescription at the time her 02/26/2016 appointment . Please advise

## 2016-02-22 MED ORDER — DIAZEPAM 10 MG PO TABS: ORAL_TABLET | ORAL | 1 refills | Status: DC

## 2016-02-22 MED ORDER — TEMAZEPAM 15 MG PO CAPS: ORAL_CAPSULE | ORAL | 0 refills | Status: DC

## 2016-02-22 NOTE — Telephone Encounter (Signed)
Last seen 11/23/15 and filled   Diazepam 11/06/15 #30 with 1 Temazepam 01/28/16  #60 with 0 rf   Please advise     KP

## 2016-02-26 ENCOUNTER — Ambulatory Visit: Payer: Medicare Other | Admitting: Family Medicine

## 2016-02-26 DIAGNOSIS — Z0289 Encounter for other administrative examinations: Secondary | ICD-10-CM

## 2016-02-27 ENCOUNTER — Encounter: Payer: Self-pay | Admitting: Family Medicine

## 2016-03-03 ENCOUNTER — Telehealth: Payer: Self-pay | Admitting: Family Medicine

## 2016-03-07 ENCOUNTER — Other Ambulatory Visit: Payer: Self-pay | Admitting: Family Medicine

## 2016-03-07 NOTE — Telephone Encounter (Signed)
Last seen 12/03/15 and filled 09/05/15  Please advise     KP

## 2016-03-10 ENCOUNTER — Encounter (HOSPITAL_COMMUNITY): Payer: Self-pay

## 2016-03-11 ENCOUNTER — Ambulatory Visit (INDEPENDENT_AMBULATORY_CARE_PROVIDER_SITE_OTHER): Payer: Medicare Other | Admitting: Family Medicine

## 2016-03-11 ENCOUNTER — Inpatient Hospital Stay (HOSPITAL_COMMUNITY)
Admission: RE | Admit: 2016-03-11 | Discharge: 2016-03-11 | Disposition: A | Discharge status: Home / Self Care | Payer: Medicare Other | Source: Ambulatory Visit

## 2016-03-11 VITALS — BP 173/98 | HR 86 | Temp 98.6°F | Wt 136.0 lb

## 2016-03-11 DIAGNOSIS — G4700 Insomnia, unspecified: Secondary | ICD-10-CM | POA: Diagnosis not present

## 2016-03-11 DIAGNOSIS — H6121 Impacted cerumen, right ear: Secondary | ICD-10-CM | POA: Diagnosis not present

## 2016-03-11 DIAGNOSIS — F411 Generalized anxiety disorder: Secondary | ICD-10-CM

## 2016-03-11 MED FILL — IBUPROFEN 600 MG TABLET: 600 | 7 days supply | Qty: 25 | Fill #0

## 2016-03-11 MED FILL — OXYCODONE/APAP 5/325MG: 5-325 | 4 days supply | Qty: 16 | Fill #0

## 2016-03-11 MED FILL — AMOXICILLIN 500 MG CAPSULE: 500 | 7 days supply | Qty: 21 | Fill #0

## 2016-03-11 NOTE — Pre-Procedure Instructions (Signed)
Brandi [name]  03/11/2016      [id number] [address], [place] [address] Phone: [phone] Fax: [phone]    Your procedure is scheduled on October 6  Report to [facility] at [place].M.  Call this number if you have problems the morning of surgery:  [phone]   Remember:  Do not eat food or drink liquids after midnight.   Take these medicines the morning of surgery with A SIP OF WATER diazepam (valium),oxyCODONE-acetaminophen (PERCOCET),  ranitidine (ZANTAC,   7 days prior to surgery STOP taking any Aspirin, Aleve, Naproxen, Ibuprofen, Motrin, Advil, Goody's, BC's, all herbal medications, fish oil, and all vitamins    Do not wear jewelry, make-up or nail polish.  Do not wear lotions, powders, or perfumes, or deoderant.  Do not shave 48 hours prior to surgery.    Do not bring valuables to the hospital.  [facility] is not responsible for any belongings or valuables.  Contacts, dentures or bridgework may not be worn into surgery.  Leave your suitcase in the car.  After surgery it may be brought to your room.  For patients admitted to the hospital, discharge time will be determined by your treatment team.  Patients discharged the day of surgery will not be allowed to drive home.    Special instructions:   [facility]- Preparing For Surgery  Before surgery, you can play an important role. Because skin is not sterile, your skin needs to be as free of germs as possible. You can reduce the number of germs on your skin by washing with CHG (chlorahexidine gluconate) Soap before surgery.  CHG is an antiseptic cleaner which kills germs and bonds with the skin to continue killing germs even after washing.  Please do not use if you have an allergy to CHG or antibacterial soaps. If your skin becomes reddened/irritated stop using the CHG.  Do not shave (including legs and underarms) for at  least 48 hours prior to first CHG shower. It is OK to shave your face.  Please follow these instructions carefully.   1. Shower the NIGHT BEFORE SURGERY and the MORNING OF SURGERY with CHG.   2. If you chose to wash your hair, wash your hair first as usual with your normal shampoo.  3. After you shampoo, rinse your hair and body thoroughly to remove the shampoo.  4. Use CHG as you would any other liquid soap. You can apply CHG directly to the skin and wash gently with a scrungie or a clean washcloth.   5. Apply the CHG Soap to your body ONLY FROM THE NECK DOWN.  Do not use on open wounds or open sores. Avoid contact with your eyes, ears, mouth and genitals (private parts). Wash genitals (private parts) with your normal soap.  6. Wash thoroughly, paying special attention to the area where your surgery will be performed.  7. Thoroughly rinse your body with warm water from the neck down.  8. DO NOT shower/wash with your normal soap after using and rinsing off the CHG Soap.  9. Pat yourself dry with a CLEAN TOWEL.   10. Wear CLEAN PAJAMAS   11. Place CLEAN SHEETS on your bed the night of your first shower and DO NOT SLEEP WITH PETS.    Day of Surgery: Do not apply any deodorants/lotions. Please wear clean clothes to the hospital/surgery center.      Please read  over the following fact sheets that you were given. Pain Booklet, Coughing and Deep Breathing, MRSA Information and Surgical Site Infection Prevention

## 2016-03-11 NOTE — Patient Instructions (Signed)
Cerumen Impaction The structures of the external ear canal secrete a waxy substance known as cerumen. Excess cerumen can build up in the ear canal, causing a condition known as cerumen impaction. Cerumen impaction can cause ear pain and disrupt the function of the ear. The rate of cerumen production differs for each individual. In certain individuals, the configuration of the ear canal may decrease his or her ability to naturally remove cerumen. CAUSES Cerumen impaction is caused by excessive cerumen production or buildup. RISK FACTORS  Frequent use of swabs to clean ears.  Having narrow ear canals.  Having eczema.  Being dehydrated. SIGNS AND SYMPTOMS  Diminished hearing.  Ear drainage.  Ear pain.  Ear itch. TREATMENT Treatment may involve:  Over-the-counter or prescription ear drops to soften the cerumen.  Removal of cerumen by a health care provider. This may be done with:  Irrigation with warm water. This is the most common method of removal.  Ear curettes and other instruments.  Surgery. This may be done in severe cases. HOME CARE INSTRUCTIONS  Take medicines only as directed by your health care provider.  Do not insert objects into the ear with the intent of cleaning the ear. PREVENTION  Do not insert objects into the ear, even with the intent of cleaning the ear. Removing cerumen as a part of normal hygiene is not necessary, and the use of swabs in the ear canal is not recommended.  Drink enough water to keep your urine clear or pale yellow.  Control your eczema if you have it. SEEK MEDICAL CARE IF:  You develop ear pain.  You develop bleeding from the ear.  The cerumen does not clear after you use ear drops as directed.   This information is not intended to replace advice given to you by your health care provider. Make sure you discuss any questions you have with your health care provider.   Document Released: 07/10/2004 Document Revised: 06/23/2014  Document Reviewed: 01/17/2015 Elsevier Interactive Patient Education 2016 [employer] Inc[employer]  

## 2016-03-11 NOTE — Progress Notes (Signed)
Pre visit review using our clinic review tool, if applicable. No additional management support is needed unless otherwise documented below in the visit note. 

## 2016-03-11 NOTE — Progress Notes (Signed)
Patient ID: SEERIT VILE, female    DOB: 12-14-1945  Age: 70 y.o. MRN: [id number]    Subjective:  Subjective  HPI CREEDENCE RAABE presents for ears feeling clogged  She saw the dentist earlier today about her dry socket.  No other complaints.   Pt is in pain with her tooth but it is being treated.     Review of Systems  Constitutional: Negative for appetite change, diaphoresis, fatigue and unexpected weight change.  Eyes: Negative for pain, redness and visual disturbance.  Respiratory: Negative for cough, chest tightness, shortness of breath and wheezing.   Cardiovascular: Negative for chest pain, palpitations and leg swelling.  Endocrine: Negative for cold intolerance, heat intolerance, polydipsia, polyphagia and polyuria.  Genitourinary: Negative for difficulty urinating, dysuria and frequency.  Neurological: Negative for dizziness, light-headedness, numbness and headaches.    History Past Medical History:  Diagnosis Date  . Anxiety    takes Valium daily as needed  . Chronic back pain   . Chronic back pain   . Complication of anesthesia 1980s   "I stopped breathing on the table; I was gettin my teeth extracted"  . Constipation   . Depression    takes Lexapro daily  . GERD (gastroesophageal reflux disease)    takes Zantac daily  . History of blood transfusion    no abnormal reaction noted  . History of colon polyps    benign  . History of gastric ulcer   . History of GI bleed   . Insomnia    takes Restoril and Trazodone nightly as needed  . Joint pain   . Joint swelling   . Nocturia   . Osteoarthritis   . Peripheral edema    occasionally but never been on fluid pill    She has a past surgical history that includes Toe Surgery (Right); Lumbar fusion; Shoulder surgery (Left); Esophagogastroduodenoscopy (N/A, 06/16/2014); Colonoscopy; Wisdom tooth extraction (1980s); Total hip arthroplasty (Left, 02/26/2015); Joint replacement; Back surgery; and Toe Surgery (Left).    Her family history includes Cancer in her father and mother; Cancer (age of onset: 81) in her sister.She reports that she has never smoked. She has never used smokeless tobacco. She reports that she does not drink alcohol or use drugs.  Current Outpatient Prescriptions on File Prior to Visit  Medication Sig Dispense Refill  . amoxicillin (AMOXIL) 500 MG tablet Take 2,000 mg by mouth See admin instructions. Take 4 tablets (2000 mg) by mouth one before dental procedures - last procedure 03/06/16  0  . cyclobenzaprine (FLEXERIL) 5 MG tablet Take 1 tablet (5 mg total) by mouth 3 (three) times daily. (Patient taking differently: Take 5 mg by mouth 3 (three) times daily as needed for muscle spasms. ) 30 tablet 0  . diazepam (VALIUM) 10 MG tablet TAKE 1 TABLET BY MOUTH EVERY 12 HOURS AS NEEDED FOR ANXIETY (Patient taking differently: Take 10 mg by mouth at bedtime as needed for sleep. ) 30 tablet 1  . diclofenac sodium (VOLTAREN) 1 % GEL APPLY 2 G TOPICALLY 4 (FOUR) TIMES DAILY. (Patient taking differently: Apply 2 g topically 2 (two) times daily as needed (pain). ) 100 g 3  . estrogen, conjugated,-medroxyprogesterone (PREMPRO) 0.3-1.5 MG tablet Take 1 tablet by mouth daily. (Patient taking differently: Take 1 tablet by mouth at bedtime. ) 28 tablet 3  . Eszopiclone 3 MG TABS Take 3 mg by mouth at bedtime as needed (sleep). Lunesta  3  . hydrochlorothiazide (HYDRODIURIL) 25 MG tablet Take 1  tablet (25 mg total) by mouth daily. 30 tablet 11  . hydrocortisone cream 1 % Apply to affected area 2 times daily 15 g 0  . oxyCODONE-acetaminophen (PERCOCET) 10-325 MG tablet Take 0.5-1 tablets by mouth every 4 (four) hours as needed for pain. (Patient taking differently: Take 1 tablet by mouth every 4 (four) hours as needed for pain. ) 20 tablet 0  . potassium chloride SA (KLOR-CON M20) 20 MEQ tablet Take 2 tablets (40 mEq total) by mouth daily. (Patient taking differently: Take 20 mEq by mouth daily. ) 60 tablet 2   . PREMPRO 0.3-1.5 MG tablet TAKE 1 TABLET BY MOUTH EVERY DAY 28 tablet 3  . ranitidine (ZANTAC) 150 MG tablet Take 1 tablet (150 mg total) by mouth 2 (two) times daily. 180 tablet 3  . temazepam (RESTORIL) 15 MG capsule 1-2 po qhs prn 60 capsule 0  . traZODone (DESYREL) 50 MG tablet TAKE 1 TABLET BY MOUTH AT BEDTIME (Patient taking differently: TAKE 1 TABLET BY MOUTH AT BEDTIME AS NEEDED FOR SLEEP) 30 tablet 0   No current facility-administered medications on file prior to visit.      Objective:  Objective  Physical Exam  Constitutional: She is oriented to person, place, and time. She appears well-developed and well-nourished.  HENT:  Head: Normocephalic and atraumatic.  Right Ear: Tympanic membrane, external ear and ear canal normal.  Left Ear: Hearing, tympanic membrane, external ear and ear canal normal.  Ears:  Eyes: Conjunctivae and EOM are normal.  Neck: Normal range of motion. Neck supple. No JVD present. Carotid bruit is not present. No thyromegaly present.  Cardiovascular: Normal rate, regular rhythm and normal heart sounds.   No murmur heard. Pulmonary/Chest: Effort normal and breath sounds normal. No respiratory distress. She has no wheezes. She has no rales. She exhibits no tenderness.  Musculoskeletal: She exhibits no edema.  Neurological: She is alert and oriented to person, place, and time.  Psychiatric: She has a normal mood and affect.  Nursing note and vitals reviewed.  BP (!) 173/98 (BP Location: Left Arm, Patient Position: Sitting, Cuff Size: Normal)   Pulse 86   Temp 98.6 F (37 C) (Oral)   Wt 136 lb (61.7 kg)   SpO2 99%   BMI 23.34 kg/m  Wt Readings from Last 3 Encounters:  03/11/16 136 lb (61.7 kg)  11/23/15 146 lb 3.2 oz (66.3 kg)  11/06/15 136 lb 12.8 oz (62.1 kg)     Lab Results  Component Value Date   WBC 7.7 01/15/2016   HGB 12.3 01/15/2016   HCT 37.0 01/15/2016   PLT 403.0 (H) 01/15/2016   GLUCOSE 92 11/23/2015   ALT 6 09/04/2015   AST  12 09/04/2015   NA 140 11/23/2015   K 3.4 (L) 11/23/2015   CL 106 11/23/2015   CREATININE 1.08 11/23/2015   BUN 7 11/23/2015   CO2 28 11/23/2015   TSH 0.58 09/04/2015   INR 1.12 02/15/2015    Dg Chest 2 View  Result Date: 11/23/2015 CLINICAL DATA:  Lower extremity edema, history of hypertension and tachycardia EXAM: CHEST  2 VIEW COMPARISON:  PA and lateral chest x-ray of February 15, 2015 FINDINGS: The lungs remain mildly hyperinflated with hemidiaphragm flattening. There is no focal infiltrate. There is no pleural effusion. The cardiac silhouette is top-normal in size and is accentuated by the pectus excavatum type chest contour. There is tortuosity of the descending thoracic aorta. The pulmonary vascularity is normal. The bony thorax exhibits no acute abnormality.  IMPRESSION: Chronic hyperinflation consistent with reactive airway disease or bronchitis. Mild cardiomegaly without pulmonary vascular congestion. Electronically Signed   By: David  Martinique M.D.   On: 11/23/2015 13:09     Assessment & Plan:  Plan  I have discontinued Ms. Shearman's hyoscyamine. I am also having her maintain her diclofenac sodium, estrogen (conjugated)-medroxyprogesterone, cyclobenzaprine, oxyCODONE-acetaminophen, Eszopiclone, ranitidine, hydrocortisone cream, hydrochlorothiazide, potassium chloride SA, traZODone, temazepam, diazepam, PREMPRO, and amoxicillin.  No orders of the defined types were placed in this encounter.   Problem List Items Addressed This Visit      Unprioritized   Insomnia   Right ear impacted cerumen    Irrigated successfully rto prn       Other Visit Diagnoses    Cerumen impaction, right    -  Primary   Generalized anxiety disorder          Follow-up: No Follow-up on file.  Ann Held, DO

## 2016-03-12 ENCOUNTER — Other Ambulatory Visit (HOSPITAL_COMMUNITY): Payer: Self-pay | Admitting: *Deleted

## 2016-03-12 ENCOUNTER — Encounter: Payer: Self-pay | Admitting: Family Medicine

## 2016-03-12 DIAGNOSIS — H6121 Impacted cerumen, right ear: Secondary | ICD-10-CM | POA: Insufficient documentation

## 2016-03-12 NOTE — Assessment & Plan Note (Signed)
Irrigated successfully  rto prn 

## 2016-03-13 ENCOUNTER — Encounter (HOSPITAL_COMMUNITY)
Admission: RE | Admit: 2016-03-13 | Discharge: 2016-03-13 | Disposition: A | Discharge status: Home / Self Care | Payer: Medicare Other | Source: Ambulatory Visit | Attending: Orthopedic Surgery | Admitting: Orthopedic Surgery

## 2016-03-13 ENCOUNTER — Encounter (HOSPITAL_COMMUNITY): Payer: Self-pay

## 2016-03-13 DIAGNOSIS — Z01818 Encounter for other preprocedural examination: Secondary | ICD-10-CM | POA: Diagnosis not present

## 2016-03-13 HISTORY — DX: Cardiac arrhythmia, unspecified: I49.9

## 2016-03-13 HISTORY — DX: Personal history of other diseases of the digestive system: Z87.19

## 2016-03-13 HISTORY — DX: Personal history of other medical treatment: Z92.89

## 2016-03-13 LAB — BASIC METABOLIC PANEL
ANION GAP: 12 (ref 5–15)
BUN: 10 mg/dL (ref 6–20)
CO2: 35 mmol/L — ABNORMAL HIGH (ref 22–32)
CREATININE: 1.55 mg/dL — AB (ref 0.44–1.00)
Calcium: 8.8 mg/dL — ABNORMAL LOW (ref 8.9–10.3)
Chloride: 92 mmol/L — ABNORMAL LOW (ref 101–111)
GFR calc non Af Amer: 33 mL/min — ABNORMAL LOW (ref >60)
GFR, EST AFRICAN AMERICAN: 39 mL/min — AB (ref >60)
Glucose, Bld: 106 mg/dL — ABNORMAL HIGH (ref 65–99)
Potassium: 2.8 mmol/L — ABNORMAL LOW (ref 3.5–5.1)
SODIUM: 139 mmol/L (ref 135–145)

## 2016-03-13 LAB — SURGICAL PCR SCREEN
MRSA, PCR: NEGATIVE
STAPHYLOCOCCUS AUREUS: NEGATIVE

## 2016-03-13 LAB — URINALYSIS, ROUTINE W REFLEX MICROSCOPIC
Glucose, UA: NEGATIVE mg/dL
Hgb urine dipstick: NEGATIVE
KETONES UR: 15 mg/dL — AB
NITRITE: NEGATIVE
PH: 6 (ref 5.0–8.0)
Protein, ur: 30 mg/dL — AB
SPECIFIC GRAVITY, URINE: 1.039 — AB (ref 1.005–1.030)

## 2016-03-13 LAB — CBC WITH DIFFERENTIAL/PLATELET
Basophils Absolute: 0 10*3/uL (ref 0.0–0.1)
Basophils Relative: 0 %
EOS ABS: 0.6 10*3/uL (ref 0.0–0.7)
Eosinophils Relative: 6 %
HCT: 36.4 % (ref 36.0–46.0)
Hemoglobin: 11.5 g/dL — ABNORMAL LOW (ref 12.0–15.0)
LYMPHS ABS: 2.4 10*3/uL (ref 0.7–4.0)
Lymphocytes Relative: 24 %
MCH: 30 pg (ref 26.0–34.0)
MCHC: 31.6 g/dL (ref 30.0–36.0)
MCV: 95 fL (ref 78.0–100.0)
MONO ABS: 0.7 10*3/uL (ref 0.1–1.0)
MONOS PCT: 7 %
Neutro Abs: 6.4 10*3/uL (ref 1.7–7.7)
Neutrophils Relative %: 63 %
PLATELETS: 547 10*3/uL — AB (ref 150–400)
RBC: 3.83 MIL/uL — ABNORMAL LOW (ref 3.87–5.11)
RDW: 14.4 % (ref 11.5–15.5)
WBC: 10.2 10*3/uL (ref 4.0–10.5)

## 2016-03-13 LAB — URINE MICROSCOPIC-ADD ON

## 2016-03-13 LAB — PROTIME-INR
INR: 0.95
PROTHROMBIN TIME: 12.6 s (ref 11.4–15.2)

## 2016-03-13 LAB — APTT: APTT: 31 s (ref 24–36)

## 2016-03-13 NOTE — Progress Notes (Signed)
Pt. Followed by Dr. Carollee Herter, she remarks that she had a stress test in [place] in approx. 1990, states that it was normal & that they told her that it was stress that was causing her report of chest pain. Pt. Currently taking amoxicil for treatment of recent oral surgery.

## 2016-03-14 NOTE — Progress Notes (Addendum)
Anesthesia Chart Review: Patient is a 70 year old female scheduled for right anterior approach THA on 03/21/16 by Dr. Mayer Camel.  History includes non-smoker, HTN, dysrhythmia (occasional "skipped" beats), hiatal hernia, GERD, anxiety, depression, peripheral edema (occasionally), gastric ulcer, arthritis, left THA 02/26/15, lumbar fusion. She was scheduled for left bunionectomy on 08/01/15, but this was canceled due to hypokalemia. She also required treatment for hypokalemia pror to left THA in 2016. Admitted overnight 09/27/15 for right leg pain following a fall.  PCP is Dr. Roma Schanz.  Meds include Flexeril, Percocet, KCL 20 mEq 1 tablet daily (although prescribed as 2 tabs daily), HCTZ, Zantac, Prempro. She takes either Lunesta, diazepam, temazepam, or trazodone on any given night for insomnia (but does not combine them). She was started on HCTZ on 11/23/15.   BP (!) 155/76   Pulse 82   Temp 36.8 C   Resp 18   Ht 5\' 4"  (1.626 m)   Wt 135 lb 4.8 oz (61.4 kg)   SpO2 98%   BMI 23.22 kg/m   03/13/16 EKG: SR with PACs, possible septal infarct (age undetermined), T wave abnormality, consider anterior ischemia, prolonged QT (QT 468/ATc 512 ms). T wave inversion is new when compared to 06/15/14 tracing. T wave abnormality is also new when compared to 02/22/15 tracing, but PVCs resolved (this EKG done during evaluation for hypokalemia).  11/23/15 CXR: IMPRESSION: Chronic hyperinflation consistent with reactive airway disease or bronchitis. Mild cardiomegaly without pulmonary vascular congestion.  Preoperative labs noted. Na 139, K 2.8, Cl 92, CO2 35, Cr 1.55 (up from 1.08 on 11/23/15; range of 0.88-1.20 since 02/15/15). H/H 11.5/36.4. PT/PTT WNL. Glucose 106. T&S done. UA showed small leukocytes, negative leukocytes, cloudy, 6-30 WBC.  Patient with recurrent hypokalemia, newly elevated Cr, low chloride, and EKG with new T wave abnormality. Discussed with anesthesiologist Dr. Therisa Doyne who agrees  with need for medical clearance by her PCP prior to surgery. If BMET has not been repeated, then that will need to be done prior to surgery as well. I called Juliann Pulse at Dr. Damita Dunnings office, she has already reached out to Dr. Reece Leader office and is attempting to get in touch with patient. Will leave chart for follow-up regarding further management recommendations and clearance status.   [facility] Stay Center/Anesthesiology Phone [phone] 03/14/2016 4:34 PM  Addendum: I have been in communication with Dr. [name]. She had her partner Dr. Nani Ravens see patient this afternoon for preoperative clearance. Patient has been on a potassium supplement (apparently had not been compliant in the past due to difficultly swallowing large pills and was changed to 10 mEq tablets).  Dr. Nani Ravens wrote, "Pending the above lab, there are no new precluding factors to the proposed ortho procedure." A follow-up BMET ordered that resulted after 4:30 PM. Cr back to normal at 0.90. K still low at 2.9. (I called result to Dr. Damita Dunnings PA Randall Hiss in hopes he or Dr. Nani Ravens with give patient further instructions regarding KCl supplement. If case remains as scheduled, she will need an ISTAT on arrival to see if K is up to 3.0 to proceed.    [facility] Short Stay Center/Anesthesiology Phone [phone] 03/20/2016 5:01 PM

## 2016-03-17 ENCOUNTER — Other Ambulatory Visit: Payer: Self-pay | Admitting: Family Medicine

## 2016-03-17 ENCOUNTER — Telehealth: Payer: Self-pay | Admitting: Family Medicine

## 2016-03-17 NOTE — Telephone Encounter (Signed)
Patient would like to have her dosage upped to 30MG  of  cyclobenzaprine (FLEXERIL) 5 MG tablet Pleas advise.  Patient is going into the hospital for a hip replacement, but would just like to let you know about this issue. She does not want a call back now, but will talk to you about it at her next appointment.    Patient Relation: Self  Patient Phone:[phone]

## 2016-03-17 NOTE — Telephone Encounter (Signed)
To MD to review.     KP 

## 2016-03-17 NOTE — Telephone Encounter (Signed)
Last seen 03/11/16 and filled 02/19/2016 #30   Please advise     KP

## 2016-03-17 NOTE — Telephone Encounter (Signed)
I will not do that-- she will have to talk to ortho about that

## 2016-03-18 ENCOUNTER — Telehealth: Payer: Self-pay | Admitting: Family Medicine

## 2016-03-18 DIAGNOSIS — E876 Hypokalemia: Secondary | ICD-10-CM

## 2016-03-18 MED ORDER — POTASSIUM CHLORIDE ER 10 MEQ PO CPCR: 20.0000 meq | ORAL_CAPSULE | Freq: Every day | ORAL | 0 refills | Status: DC

## 2016-03-18 NOTE — Telephone Encounter (Signed)
Pt called back in. She is requesting a call back. She would like to know if she should still have her surgery due to her K level?

## 2016-03-18 NOTE — Telephone Encounter (Signed)
Patient has questions regarding her potassium levels and surgery. She states the surgeon would like to know what Dr. Carollee Herter suggests on either increasing her potassium or getting another blood test to check her levels because she was told they are too low. Please advise.   Patient Relation: self Patient phone:[phone]

## 2016-03-18 NOTE — Telephone Encounter (Signed)
Patient called back to follow up on what provider wants her to do about potassium. Please adv.

## 2016-03-18 NOTE — Telephone Encounter (Signed)
K was 2.8 on 9/28. She said she is not taking the potassium daily. She said the pills are too large and she has not taken them in 2 weeks.    KP

## 2016-03-18 NOTE — Telephone Encounter (Signed)
Change to 10 meq -- take 2 daily and recheck K in 2 weeks

## 2016-03-18 NOTE — Telephone Encounter (Signed)
No ans and VM has not been activated.   KP

## 2016-03-18 NOTE — Telephone Encounter (Signed)
Patient is aware and verbalized understanding, she said she will call Dr.Rowan for the flexeril.    KP

## 2016-03-18 NOTE — Telephone Encounter (Signed)
Patient has been made aware and she verbalized understanding, Rx has been faxed and she has been made aware to discuss with the Surgeon whether he wants to proceed, she verbalized understanding.    KP

## 2016-03-19 ENCOUNTER — Telehealth: Payer: Self-pay

## 2016-03-19 NOTE — H&P (Signed)
TOTAL HIP ADMISSION H&P  Patient is admitted for right total hip arthroplasty.  Subjective:  Chief Complaint: right hip pain  HPI: Brandi [name], 70 y.o. female, has a history of pain and functional disability in the right hip(s) due to arthritis and patient has failed non-surgical conservative treatments for greater than 12 weeks to include NSAID's and/or analgesics, flexibility and strengthening excercises, use of assistive devices, weight reduction as appropriate and activity modification.  Onset of symptoms was gradual starting several years ago with gradually worsening course since that time.The patient noted no past surgery on the right hip(s).  Patient currently rates pain in the right hip at 10 out of 10 with activity. Patient has night pain, worsening of pain with activity and weight bearing, pain that interfers with activities of daily living and pain with passive range of motion. Patient has evidence of joint space narrowing by imaging studies. This condition presents safety issues increasing the risk of falls.   There is no current active infection.  Patient Active Problem List   Diagnosis Date Noted  . Right ear impacted cerumen 03/12/2016  . Fall at home 10/21/2015  . Right hip pain 09/27/2015  . Leg pain 09/27/2015  . Fatigue 04/22/2015  . Arthritis, hip 02/26/2015  . Primary osteoarthritis of left hip 02/25/2015  . Pre-op evaluation 02/22/2015  . Sinusitis, chronic 01/30/2015  . Insomnia 01/30/2015  . Hip pain, chronic 12/26/2014  . Lymphadenopathy 11/24/2014  . Hypokalemia 06/15/2014  . Rectal bleeding 06/15/2014  . AKI (acute kidney injury) ([place]) 06/15/2014  . Essential hypertension, benign 04/10/2014  . Tachycardia 04/10/2014  . GERD (gastroesophageal reflux disease) 11/08/2013  . Chronic constipation 11/08/2013   Past Medical History:  Diagnosis Date  . Anxiety    takes Valium daily as needed  . Chronic back pain   . Chronic back pain   . Complication of  anesthesia 1980s   "I stopped breathing on the table; I was gettin my wisdom teeth extracted"  . Constipation   . Depression    takes Lexapro daily  . Dysrhythmia    pt. reports that her heart skips sometimes   . GERD (gastroesophageal reflux disease)    takes Zantac daily  . History of blood transfusion    no abnormal reaction noted  . History of colon polyps    benign  . History of gastric ulcer   . History of GI bleed   . History of hiatal hernia   . History of stress test    done in [place]- done in  1992, told that it was normal.   . Hypertension   . Insomnia    takes Restoril and Trazodone nightly as needed  . Joint pain   . Joint swelling   . Nocturia   . Osteoarthritis   . Peripheral edema    occasionally but never been on fluid pill    Past Surgical History:  Procedure Laterality Date  . BACK SURGERY    . COLONOSCOPY    . ESOPHAGOGASTRODUODENOSCOPY N/A 06/16/2014   Procedure: ESOPHAGOGASTRODUODENOSCOPY (EGD);  Surgeon: Missy Sabins, MD;  Location: [facility] ENDOSCOPY;  Service: Endoscopy;  Laterality: N/A;  . JOINT REPLACEMENT    . LUMBAR FUSION    . SHOULDER SURGERY Left   . TOE SURGERY Right    "big toe was coming off"  . TOE SURGERY Left    "bone spur"  . TOTAL HIP ARTHROPLASTY Left 02/26/2015   Procedure: TOTAL HIP ARTHROPLASTY ANTERIOR APPROACH;  Surgeon: Pilar Plate  Mayer Camel, MD;  Location: [place];  Service: Orthopedics;  Laterality: Left;  . WISDOM TOOTH EXTRACTION  1980s    No prescriptions prior to admission.   Allergies  Allergen Reactions  . Aspirin Other (See Comments)    Stomach ulcer, now inactive, OK with low dose ASA    Social History  Substance Use Topics  . Smoking status: Never Smoker  . Smokeless tobacco: Never Used  . Alcohol use No    Family History  Problem Relation Age of Onset  . Cancer Mother     colon cancer  . Cancer Father     colon cancer  . Cancer Sister 14    died of colon cancer  . Heart disease Neg Hx      Review of Systems   Constitutional: Positive for malaise/fatigue.  HENT: Negative.   Eyes: Negative.   Respiratory: Negative.   Cardiovascular: Negative.   Gastrointestinal: Positive for abdominal pain, constipation, diarrhea, heartburn, nausea and vomiting.  Genitourinary: Positive for dysuria and urgency.       Poor bladder control  Musculoskeletal: Positive for joint pain and myalgias.  Skin: Negative.   Neurological: Negative.   Endo/Heme/Allergies: Negative.   Psychiatric/Behavioral: The patient is nervous/anxious and has insomnia.     Objective:  Physical Exam  Constitutional: She is oriented to person, place, and time. She appears well-developed and well-nourished.  HENT:  Head: Normocephalic and atraumatic.  Eyes: Pupils are equal, round, and reactive to light.  Neck: Normal range of motion. Neck supple.  Cardiovascular: Intact distal pulses.   Respiratory: Effort normal.  Musculoskeletal:  Significant discomfort with any attempts at internal rotation of the right hip, foot tap is negative she walks with a right sided antalgic gait.    Neurological: She is alert and oriented to person, place, and time.  Skin: Skin is warm and dry.  Psychiatric: She has a normal mood and affect. Her behavior is normal. Judgment and thought content normal.    Vital signs in last 24 hours:    Labs:   Estimated body mass index is 23.22 kg/m as calculated from the following:   Height as of 03/13/16: 5\' 4"  (1.626 m).   Weight as of 03/13/16: 61.4 kg (135 lb 4.8 oz).   Imaging Review Plain radiographs demonstrate bone-on-bone arthritic changes of the right hip.   Assessment/Plan:  End stage arthritis, right hip(s)  The patient history, physical examination, clinical judgement of the provider and imaging studies are consistent with end stage degenerative joint disease of the right hip(s) and total hip arthroplasty is deemed medically necessary. The treatment options including medical management,  injection therapy, arthroscopy and arthroplasty were discussed at length. The risks and benefits of total hip arthroplasty were presented and reviewed. The risks due to aseptic loosening, infection, stiffness, dislocation/subluxation,  thromboembolic complications and other imponderables were discussed.  The patient acknowledged the explanation, agreed to proceed with the plan and consent was signed. Patient is being admitted for inpatient treatment for surgery, pain control, PT, OT, prophylactic antibiotics, VTE prophylaxis, progressive ambulation and ADL's and discharge planning.The patient is planning to be discharged to skilled nursing facility

## 2016-03-19 NOTE — Telephone Encounter (Signed)
Called and informed patient she has an appointment with Dr. Audelia Acton 03/20/16 @ 2:00 pm. Patient states she understand appointment and had no questions or concerns.

## 2016-03-19 NOTE — Telephone Encounter (Signed)
Called patient to inform patient she has an appointment with Dr. Audelia Acton. Patient states she understand appointment. She had no further questions, or concerns.

## 2016-03-20 ENCOUNTER — Ambulatory Visit (INDEPENDENT_AMBULATORY_CARE_PROVIDER_SITE_OTHER): Payer: Medicare Other | Admitting: Family Medicine

## 2016-03-20 ENCOUNTER — Encounter: Payer: Self-pay | Admitting: Family Medicine

## 2016-03-20 VITALS — BP 130/60 | HR 95 | Temp 98.3°F | Ht 64.0 in | Wt 139.4 lb

## 2016-03-20 DIAGNOSIS — E876 Hypokalemia: Secondary | ICD-10-CM

## 2016-03-20 DIAGNOSIS — M1611 Unilateral primary osteoarthritis, right hip: Secondary | ICD-10-CM | POA: Diagnosis present

## 2016-03-20 DIAGNOSIS — N179 Acute kidney failure, unspecified: Secondary | ICD-10-CM | POA: Diagnosis not present

## 2016-03-20 LAB — BASIC METABOLIC PANEL
BUN: 6 mg/dL (ref 6–23)
CHLORIDE: 102 meq/L (ref 96–112)
CO2: 32 meq/L (ref 19–32)
CREATININE: 0.9 mg/dL (ref 0.40–1.20)
Calcium: 9.1 mg/dL (ref 8.4–10.5)
GFR: 79.98 mL/min (ref >60.00)
GLUCOSE: 104 mg/dL — AB (ref 70–99)
Potassium: 2.9 mEq/L — ABNORMAL LOW (ref 3.5–5.1)
SODIUM: 142 meq/L (ref 135–145)

## 2016-03-20 MED FILL — OXYCODONE-APAP 10-325: 10-325 | 30 days supply | Qty: 60 | Fill #0

## 2016-03-20 NOTE — Anesthesia Preprocedure Evaluation (Signed)
Anesthesia Evaluation    History of Anesthesia Complications (+) PONV and history of anesthetic complications  Airway Mallampati: II  TM Distance: >3 FB Neck ROM: Full    Dental  (+) Teeth Intact, Dental Advisory Given   Pulmonary neg pulmonary ROS,    Pulmonary exam normal        Cardiovascular hypertension, Normal cardiovascular exam     Neuro/Psych PSYCHIATRIC DISORDERS Anxiety Depression negative neurological ROS     GI/Hepatic Neg liver ROS, hiatal hernia, GERD  ,  Endo/Other  negative endocrine ROS  Renal/GU negative Renal ROS     Musculoskeletal   Abdominal   Peds  Hematology   Anesthesia Other Findings   Reproductive/Obstetrics                             Anesthesia Physical  Anesthesia Plan  ASA: II  Anesthesia Plan: Spinal and MAC   Post-op Pain Management: GA combined w/ Regional for post-op pain   Induction: Intravenous  Airway Management Planned: Simple Face Mask  Additional Equipment:   Intra-op Plan:   Post-operative Plan:   Informed Consent: I have reviewed the patients History and Physical, chart, labs and discussed the procedure including the risks, benefits and alternatives for the proposed anesthesia with the patient or authorized representative who has indicated his/her understanding and acceptance.   Dental advisory given  Plan Discussed with: CRNA and Anesthesiologist  Anesthesia Plan Comments:         Anesthesia Quick Evaluation

## 2016-03-20 NOTE — Progress Notes (Signed)
Chief Complaint  Patient presents with  . Surgical clearance    Subjective: Patient is a 70 y.o. female here for surgical clearance.  Had pre-op physical done in Sept for ortho procedure. Cancelled due to AKI and hypokalemia. Here for recheck today. Found that she had not been taking supplemental K, but had restarted it. She reportedly had an abnormal EKG 2/2 K abn. She has been taking her K supplement recently. No chest pain, decreased ROM in the neck, SOB, or palpitations.   ROS: Heart: Denies chest pain or palpitations Lungs: Denies SOB   Family History  Problem Relation Age of Onset  . Cancer Mother     colon cancer  . Cancer Father     colon cancer  . Cancer Sister 29    died of colon cancer  . Heart disease Neg Hx    Past Medical History:  Diagnosis Date  . Anxiety    takes Valium daily as needed  . Chronic back pain   . Chronic back pain   . Complication of anesthesia 1980s   "I stopped breathing on the table; I was gettin my wisdom teeth extracted"  . Constipation   . Depression    takes Lexapro daily  . Dysrhythmia    pt. reports that her heart skips sometimes   . GERD (gastroesophageal reflux disease)    takes Zantac daily  . History of blood transfusion    no abnormal reaction noted  . History of colon polyps    benign  . History of gastric ulcer   . History of GI bleed   . History of hiatal hernia   . History of stress test    done in [place]- done in  1992, told that it was normal.   . Hypertension   . Insomnia    takes Restoril and Trazodone nightly as needed  . Joint pain   . Joint swelling   . Nocturia   . Osteoarthritis   . Peripheral edema    occasionally but never been on fluid pill   Allergies  Allergen Reactions  . Aspirin Other (See Comments)    Stomach ulcer, now inactive, OK with low dose ASA    Current Outpatient Prescriptions:  .  amoxicillin (AMOXIL) 500 MG tablet, Take 2,000 mg by mouth See admin instructions. Take 4 tablets  (2000 mg) by mouth one before dental procedures - last procedure 03/06/16, Disp: , Rfl: 0 .  cyclobenzaprine (FLEXERIL) 5 MG tablet, Take 1 tablet (5 mg total) by mouth 3 (three) times daily., Disp: 30 tablet, Rfl: 0 .  diazepam (VALIUM) 10 MG tablet, TAKE 1 TABLET BY MOUTH EVERY 12 HOURS AS NEEDED FOR ANXIETY (Patient taking differently: Take 10 mg by mouth at bedtime as needed for sleep. ), Disp: 30 tablet, Rfl: 1 .  diclofenac sodium (VOLTAREN) 1 % GEL, APPLY 2 G TOPICALLY 4 (FOUR) TIMES DAILY. (Patient taking differently: Apply 2 g topically 2 (two) times daily as needed (pain). ), Disp: 100 g, Rfl: 3 .  estrogen, conjugated,-medroxyprogesterone (PREMPRO) 0.3-1.5 MG tablet, Take 1 tablet by mouth daily. (Patient taking differently: Take 1 tablet by mouth at bedtime. ), Disp: 28 tablet, Rfl: 3 .  Eszopiclone 3 MG TABS, Take 3 mg by mouth at bedtime as needed (sleep). Lunesta, Disp: , Rfl: 3 .  hydrocortisone cream 1 %, Apply to affected area 2 times daily, Disp: 15 g, Rfl: 0 .  ibuprofen (ADVIL,MOTRIN) 600 MG tablet, Take 600 mg by mouth every 6 (  six) hours as needed., Disp: , Rfl:  .  oxyCODONE-acetaminophen (PERCOCET) 10-325 MG tablet, Take 0.5-1 tablets by mouth every 4 (four) hours as needed for pain. (Patient taking differently: Take 1 tablet by mouth every 4 (four) hours as needed for pain. Pt. Reports that she sometimes takes 2 tabs.), Disp: 20 tablet, Rfl: 0 .  potassium chloride (MICRO-K) 10 MEQ CR capsule, Take 2 capsules (20 mEq total) by mouth daily., Disp: 60 capsule, Rfl: 0 .  ranitidine (ZANTAC) 150 MG tablet, Take 1 tablet (150 mg total) by mouth 2 (two) times daily., Disp: 180 tablet, Rfl: 3 .  temazepam (RESTORIL) 15 MG capsule, 1-2 po qhs prn, Disp: 60 capsule, Rfl: 0 .  traZODone (DESYREL) 50 MG tablet, TAKE 1 TABLET BY MOUTH AT BEDTIME, Disp: 30 tablet, Rfl: 0 .  hydrochlorothiazide (HYDRODIURIL) 25 MG tablet, Take 1 tablet (25 mg total) by mouth daily. (Patient not taking:  Reported on 03/20/2016), Disp: 30 tablet, Rfl: 11  Objective: BP 130/60 (BP Location: Left Arm, Patient Position: Sitting, Cuff Size: Normal)   Pulse 95   Temp 98.3 F (36.8 C) (Oral)   Ht 5\' 4"  (1.626 m)   Wt 139 lb 6.4 oz (63.2 kg)   SpO2 98%   BMI 23.93 kg/m  General: Awake, appears stated age HEENT: MMM, EOMi, Mallampati 1 Heart: RRR, no murmurs, no LE edema Lungs: CTAB, no rales, wheezes or rhonchi. Normal effort MSK: Nml ROM of neck Psych: Age appropriate judgment and insight, normal affect and mood  Assessment and Plan: Hypokalemia - Plan: Basic Metabolic Panel (BMET)  AKI (acute kidney injury) ([place]) - Plan: Basic Metabolic Panel (BMET)  Orders as above. Pending the above lab, there are no new precluding factors to the proposed ortho procedure. F/u with Dr. Carollee Herter prn. The patient voiced understanding and agreement to the plan.  [place], DO 03/20/16  2:50 PM

## 2016-03-20 NOTE — Progress Notes (Signed)
Pre visit review using our clinic review tool, if applicable. No additional management support is needed unless otherwise documented below in the visit note. 

## 2016-03-20 NOTE — Addendum Note (Signed)
Addended by: Caffie Pinto on: 03/20/2016 03:26 PM   Modules accepted: Orders

## 2016-03-21 ENCOUNTER — Inpatient Hospital Stay (HOSPITAL_COMMUNITY): Payer: Medicare Other | Admitting: Vascular Surgery

## 2016-03-21 ENCOUNTER — Inpatient Hospital Stay (HOSPITAL_COMMUNITY)
Admission: RE | Admit: 2016-03-21 | Discharge: 2016-03-24 | DRG: 470 | Disposition: A | Discharge status: Skilled Nursing Facility | Payer: Medicare Other | Source: Ambulatory Visit | Attending: Orthopedic Surgery | Admitting: Orthopedic Surgery

## 2016-03-21 ENCOUNTER — Other Ambulatory Visit: Payer: Self-pay | Admitting: Family Medicine

## 2016-03-21 ENCOUNTER — Inpatient Hospital Stay (HOSPITAL_COMMUNITY): Payer: Medicare Other

## 2016-03-21 ENCOUNTER — Encounter (HOSPITAL_COMMUNITY): Payer: Self-pay | Admitting: Certified Registered Nurse Anesthetist

## 2016-03-21 ENCOUNTER — Encounter (HOSPITAL_COMMUNITY)
Admission: RE | Disposition: A | Discharge status: Skilled Nursing Facility | Payer: Self-pay | Source: Ambulatory Visit | Attending: Orthopedic Surgery

## 2016-03-21 DIAGNOSIS — D62 Acute posthemorrhagic anemia: Secondary | ICD-10-CM | POA: Diagnosis not present

## 2016-03-21 DIAGNOSIS — G4700 Insomnia, unspecified: Secondary | ICD-10-CM | POA: Diagnosis present

## 2016-03-21 DIAGNOSIS — Z96642 Presence of left artificial hip joint: Secondary | ICD-10-CM | POA: Diagnosis present

## 2016-03-21 DIAGNOSIS — Z419 Encounter for procedure for purposes other than remedying health state, unspecified: Secondary | ICD-10-CM

## 2016-03-21 DIAGNOSIS — R2681 Unsteadiness on feet: Secondary | ICD-10-CM | POA: Diagnosis not present

## 2016-03-21 DIAGNOSIS — R5383 Other fatigue: Secondary | ICD-10-CM | POA: Diagnosis present

## 2016-03-21 DIAGNOSIS — I10 Essential (primary) hypertension: Secondary | ICD-10-CM | POA: Diagnosis present

## 2016-03-21 DIAGNOSIS — Z7982 Long term (current) use of aspirin: Secondary | ICD-10-CM | POA: Diagnosis not present

## 2016-03-21 DIAGNOSIS — K5909 Other constipation: Secondary | ICD-10-CM | POA: Diagnosis present

## 2016-03-21 DIAGNOSIS — Z800 Family history of malignant neoplasm of digestive organs: Secondary | ICD-10-CM

## 2016-03-21 DIAGNOSIS — M549 Dorsalgia, unspecified: Secondary | ICD-10-CM | POA: Diagnosis present

## 2016-03-21 DIAGNOSIS — Z8711 Personal history of peptic ulcer disease: Secondary | ICD-10-CM | POA: Diagnosis not present

## 2016-03-21 DIAGNOSIS — Z79890 Hormone replacement therapy (postmenopausal): Secondary | ICD-10-CM | POA: Diagnosis not present

## 2016-03-21 DIAGNOSIS — Z9181 History of falling: Secondary | ICD-10-CM

## 2016-03-21 DIAGNOSIS — M62838 Other muscle spasm: Secondary | ICD-10-CM | POA: Diagnosis not present

## 2016-03-21 DIAGNOSIS — G8929 Other chronic pain: Secondary | ICD-10-CM | POA: Diagnosis present

## 2016-03-21 DIAGNOSIS — K449 Diaphragmatic hernia without obstruction or gangrene: Secondary | ICD-10-CM | POA: Diagnosis present

## 2016-03-21 DIAGNOSIS — Z96641 Presence of right artificial hip joint: Secondary | ICD-10-CM | POA: Diagnosis not present

## 2016-03-21 DIAGNOSIS — F329 Major depressive disorder, single episode, unspecified: Secondary | ICD-10-CM | POA: Diagnosis present

## 2016-03-21 DIAGNOSIS — Z981 Arthrodesis status: Secondary | ICD-10-CM | POA: Diagnosis not present

## 2016-03-21 DIAGNOSIS — R351 Nocturia: Secondary | ICD-10-CM | POA: Diagnosis present

## 2016-03-21 DIAGNOSIS — Z471 Aftercare following joint replacement surgery: Secondary | ICD-10-CM | POA: Diagnosis not present

## 2016-03-21 DIAGNOSIS — F419 Anxiety disorder, unspecified: Secondary | ICD-10-CM | POA: Diagnosis present

## 2016-03-21 DIAGNOSIS — M6281 Muscle weakness (generalized): Secondary | ICD-10-CM | POA: Diagnosis not present

## 2016-03-21 DIAGNOSIS — M25551 Pain in right hip: Secondary | ICD-10-CM | POA: Diagnosis not present

## 2016-03-21 DIAGNOSIS — E876 Hypokalemia: Secondary | ICD-10-CM

## 2016-03-21 DIAGNOSIS — Z86010 Personal history of colonic polyps: Secondary | ICD-10-CM

## 2016-03-21 DIAGNOSIS — K219 Gastro-esophageal reflux disease without esophagitis: Secondary | ICD-10-CM | POA: Diagnosis present

## 2016-03-21 DIAGNOSIS — M1611 Unilateral primary osteoarthritis, right hip: Secondary | ICD-10-CM | POA: Diagnosis present

## 2016-03-21 DIAGNOSIS — M169 Osteoarthritis of hip, unspecified: Secondary | ICD-10-CM | POA: Diagnosis not present

## 2016-03-21 HISTORY — PX: TOTAL HIP ARTHROPLASTY: SHX124

## 2016-03-21 LAB — POCT I-STAT 4, (NA,K, GLUC, HGB,HCT)
Glucose, Bld: 103 mg/dL — ABNORMAL HIGH (ref 65–99)
HCT: 27 % — ABNORMAL LOW (ref 36.0–46.0)
HEMOGLOBIN: 9.2 g/dL — AB (ref 12.0–15.0)
Potassium: 2.8 mmol/L — ABNORMAL LOW (ref 3.5–5.1)
Sodium: 141 mmol/L (ref 135–145)

## 2016-03-21 LAB — PREPARE RBC (CROSSMATCH)

## 2016-03-21 SURGERY — ARTHROPLASTY, HIP, TOTAL, ANTERIOR APPROACH
Anesthesia: Monitor Anesthesia Care | Site: Hip | Laterality: Right

## 2016-03-21 MED ORDER — FENTANYL CITRATE (PF) 100 MCG/2ML IJ SOLN
INTRAMUSCULAR | Status: DC | PRN
  Administered 2016-03-21: 25 ug via INTRAVENOUS

## 2016-03-21 MED ORDER — OXYCODONE-ACETAMINOPHEN 10-325 MG PO TABS: 1.0000 | ORAL_TABLET | ORAL | 0 refills | Status: DC | PRN

## 2016-03-21 MED ORDER — MIDAZOLAM HCL 2 MG/2ML IJ SOLN
INTRAMUSCULAR | Status: AC
  Filled 2016-03-21: qty 2

## 2016-03-21 MED ORDER — ONDANSETRON HCL 4 MG/2ML IJ SOLN
INTRAMUSCULAR | Status: AC
  Filled 2016-03-21: qty 2

## 2016-03-21 MED ORDER — FLEET ENEMA 7-19 GM/118ML RE ENEM: 1.0000 | ENEMA | Freq: Once | RECTAL | Status: DC | PRN

## 2016-03-21 MED ORDER — CEFAZOLIN SODIUM-DEXTROSE 2-4 GM/100ML-% IV SOLN
2.0000 g | INTRAVENOUS | Status: AC
  Administered 2016-03-21: 2 g via INTRAVENOUS

## 2016-03-21 MED ORDER — OXYCODONE-ACETAMINOPHEN 10-325 MG PO TABS: 0.5000 | ORAL_TABLET | ORAL | Status: DC | PRN

## 2016-03-21 MED ORDER — ASPIRIN EC 325 MG PO TBEC: 325.0000 mg | DELAYED_RELEASE_TABLET | Freq: Two times a day (BID) | ORAL | 0 refills | Status: DC

## 2016-03-21 MED ORDER — BUPIVACAINE IN DEXTROSE 0.75-8.25 % IT SOLN
INTRATHECAL | Status: DC | PRN
  Administered 2016-03-21: 1.8 mL via INTRATHECAL

## 2016-03-21 MED ORDER — CYCLOBENZAPRINE HCL 5 MG PO TABS: 5.0000 mg | ORAL_TABLET | Freq: Three times a day (TID) | ORAL | 0 refills | Status: DC

## 2016-03-21 MED ORDER — POTASSIUM CHLORIDE ER 10 MEQ PO TBCR: 30.0000 meq | EXTENDED_RELEASE_TABLET | Freq: Three times a day (TID) | ORAL | 0 refills | Status: DC

## 2016-03-21 MED ORDER — HYDROMORPHONE HCL 1 MG/ML IJ SOLN: 1.0000 mg | INTRAMUSCULAR | Status: DC | PRN

## 2016-03-21 MED ORDER — ONDANSETRON HCL 4 MG/2ML IJ SOLN: 4.0000 mg | Freq: Four times a day (QID) | INTRAMUSCULAR | Status: DC | PRN

## 2016-03-21 MED ORDER — 0.9 % SODIUM CHLORIDE (POUR BTL) OPTIME
TOPICAL | Status: DC | PRN
  Administered 2016-03-21: 1000 mL

## 2016-03-21 MED ORDER — CHLORHEXIDINE GLUCONATE 4 % EX LIQD: 60.0000 mL | Freq: Once | CUTANEOUS | Status: DC

## 2016-03-21 MED ORDER — PROMETHAZINE HCL 25 MG/ML IJ SOLN: 6.2500 mg | INTRAMUSCULAR | Status: DC | PRN

## 2016-03-21 MED ORDER — PROPOFOL 10 MG/ML IV BOLUS
INTRAVENOUS | Status: AC
  Filled 2016-03-21: qty 20

## 2016-03-21 MED ORDER — MENTHOL 3 MG MT LOZG: 1.0000 | LOZENGE | OROMUCOSAL | Status: DC | PRN

## 2016-03-21 MED ORDER — LACTATED RINGERS IV SOLN
INTRAVENOUS | Status: DC | PRN
  Administered 2016-03-21 (×2): via INTRAVENOUS

## 2016-03-21 MED ORDER — METHOCARBAMOL 1000 MG/10ML IJ SOLN
500.0000 mg | Freq: Four times a day (QID) | INTRAMUSCULAR | Status: DC | PRN
  Filled 2016-03-21: qty 5

## 2016-03-21 MED ORDER — ACETAMINOPHEN 650 MG RE SUPP: 650.0000 mg | Freq: Four times a day (QID) | RECTAL | Status: DC | PRN

## 2016-03-21 MED ORDER — OXYCODONE HCL 5 MG/5ML PO SOLN: 5.0000 mg | Freq: Once | ORAL | Status: DC | PRN

## 2016-03-21 MED ORDER — TEMAZEPAM 15 MG PO CAPS: 30.0000 mg | ORAL_CAPSULE | Freq: Every evening | ORAL | Status: DC | PRN

## 2016-03-21 MED ORDER — DEXTROSE-NACL 5-0.45 % IV SOLN: INTRAVENOUS | Status: DC

## 2016-03-21 MED ORDER — METHOCARBAMOL 500 MG PO TABS
500.0000 mg | ORAL_TABLET | Freq: Four times a day (QID) | ORAL | Status: DC | PRN
  Administered 2016-03-22 – 2016-03-24 (×4): 500 mg via ORAL
  Filled 2016-03-21 (×4): qty 1

## 2016-03-21 MED ORDER — BUPIVACAINE-EPINEPHRINE 0.25% -1:200000 IJ SOLN
INTRAMUSCULAR | Status: DC | PRN
  Administered 2016-03-21: 30 mL

## 2016-03-21 MED ORDER — MIDAZOLAM HCL 5 MG/5ML IJ SOLN
INTRAMUSCULAR | Status: DC | PRN
  Administered 2016-03-21: 2 mg via INTRAVENOUS

## 2016-03-21 MED ORDER — KCL IN DEXTROSE-NACL 20-5-0.45 MEQ/L-%-% IV SOLN
INTRAVENOUS | Status: DC
  Administered 2016-03-21 – 2016-03-24 (×3): via INTRAVENOUS
  Filled 2016-03-21 (×3): qty 1000

## 2016-03-21 MED ORDER — OXYCODONE HCL 5 MG PO TABS: 5.0000 mg | ORAL_TABLET | Freq: Once | ORAL | Status: DC | PRN

## 2016-03-21 MED ORDER — DEXAMETHASONE SODIUM PHOSPHATE 10 MG/ML IJ SOLN
10.0000 mg | Freq: Once | INTRAMUSCULAR | Status: AC
  Administered 2016-03-22: 10 mg via INTRAVENOUS
  Filled 2016-03-21: qty 1

## 2016-03-21 MED ORDER — PROPOFOL 500 MG/50ML IV EMUL
INTRAVENOUS | Status: DC | PRN
  Administered 2016-03-21: 30 ug/kg/min via INTRAVENOUS

## 2016-03-21 MED ORDER — FENTANYL CITRATE (PF) 100 MCG/2ML IJ SOLN: 25.0000 ug | INTRAMUSCULAR | Status: DC | PRN

## 2016-03-21 MED ORDER — DIAZEPAM 5 MG PO TABS: 10.0000 mg | ORAL_TABLET | Freq: Every evening | ORAL | Status: DC | PRN

## 2016-03-21 MED ORDER — FAMOTIDINE 20 MG PO TABS
20.0000 mg | ORAL_TABLET | Freq: Two times a day (BID) | ORAL | Status: DC
  Administered 2016-03-21 – 2016-03-24 (×7): 20 mg via ORAL
  Filled 2016-03-21 (×7): qty 1

## 2016-03-21 MED ORDER — DIPHENHYDRAMINE HCL 12.5 MG/5ML PO ELIX: 12.5000 mg | ORAL_SOLUTION | ORAL | Status: DC | PRN

## 2016-03-21 MED ORDER — ACETAMINOPHEN 325 MG PO TABS
650.0000 mg | ORAL_TABLET | Freq: Four times a day (QID) | ORAL | Status: DC | PRN
  Administered 2016-03-21 – 2016-03-23 (×3): 650 mg via ORAL
  Filled 2016-03-21 (×3): qty 2

## 2016-03-21 MED ORDER — SENNOSIDES-DOCUSATE SODIUM 8.6-50 MG PO TABS: 1.0000 | ORAL_TABLET | Freq: Every evening | ORAL | Status: DC | PRN

## 2016-03-21 MED ORDER — SODIUM CHLORIDE 0.9 % IV SOLN: Freq: Once | INTRAVENOUS | Status: AC

## 2016-03-21 MED ORDER — SODIUM CHLORIDE 0.9 % IV SOLN
1000.0000 mg | INTRAVENOUS | Status: AC
  Administered 2016-03-21: 1000 mg via INTRAVENOUS
  Filled 2016-03-21: qty 10

## 2016-03-21 MED ORDER — BUPIVACAINE LIPOSOME 1.3 % IJ SUSP
20.0000 mL | Freq: Once | INTRAMUSCULAR | Status: AC
Start: 2016-03-21 — End: 2016-03-21
  Administered 2016-03-21: 20 mL
  Filled 2016-03-21: qty 20

## 2016-03-21 MED ORDER — ALUM & MAG HYDROXIDE-SIMETH 200-200-20 MG/5ML PO SUSP: 30.0000 mL | ORAL | Status: DC | PRN

## 2016-03-21 MED ORDER — DOCUSATE SODIUM 100 MG PO CAPS
100.0000 mg | ORAL_CAPSULE | Freq: Two times a day (BID) | ORAL | Status: DC
  Administered 2016-03-21 – 2016-03-24 (×7): 100 mg via ORAL
  Filled 2016-03-21 (×7): qty 1

## 2016-03-21 MED ORDER — METOCLOPRAMIDE HCL 5 MG/ML IJ SOLN: 5.0000 mg | Freq: Three times a day (TID) | INTRAMUSCULAR | Status: DC | PRN

## 2016-03-21 MED ORDER — TRAZODONE HCL 50 MG PO TABS
50.0000 mg | ORAL_TABLET | Freq: Every day | ORAL | Status: DC
  Administered 2016-03-21 – 2016-03-23 (×3): 50 mg via ORAL
  Filled 2016-03-21 (×3): qty 1

## 2016-03-21 MED ORDER — ONDANSETRON HCL 4 MG/2ML IJ SOLN
INTRAMUSCULAR | Status: DC | PRN
  Administered 2016-03-21: 4 mg via INTRAVENOUS

## 2016-03-21 MED ORDER — BUPIVACAINE-EPINEPHRINE 0.25% -1:200000 IJ SOLN
INTRAMUSCULAR | Status: AC
  Filled 2016-03-21: qty 1

## 2016-03-21 MED ORDER — CONJ ESTROG-MEDROXYPROGEST ACE 0.3-1.5 MG PO TABS
1.0000 | ORAL_TABLET | Freq: Every day | ORAL | Status: DC
  Administered 2016-03-22 – 2016-03-23 (×3): 1 via ORAL
  Filled 2016-03-21 (×3): qty 1

## 2016-03-21 MED ORDER — ONDANSETRON HCL 4 MG PO TABS: 4.0000 mg | ORAL_TABLET | Freq: Four times a day (QID) | ORAL | Status: DC | PRN

## 2016-03-21 MED ORDER — FENTANYL CITRATE (PF) 100 MCG/2ML IJ SOLN
INTRAMUSCULAR | Status: AC
  Filled 2016-03-21: qty 2

## 2016-03-21 MED ORDER — BISACODYL 5 MG PO TBEC: 5.0000 mg | DELAYED_RELEASE_TABLET | Freq: Every day | ORAL | Status: DC | PRN

## 2016-03-21 MED ORDER — METOCLOPRAMIDE HCL 5 MG PO TABS: 5.0000 mg | ORAL_TABLET | Freq: Three times a day (TID) | ORAL | Status: DC | PRN

## 2016-03-21 MED ORDER — OXYCODONE HCL 5 MG PO TABS
5.0000 mg | ORAL_TABLET | ORAL | Status: DC | PRN
  Administered 2016-03-21 – 2016-03-23 (×6): 10 mg via ORAL
  Administered 2016-03-23 – 2016-03-24 (×3): 5 mg via ORAL
  Filled 2016-03-21 (×2): qty 2
  Filled 2016-03-21 (×2): qty 1
  Filled 2016-03-21 (×5): qty 2

## 2016-03-21 MED ORDER — TRANEXAMIC ACID 1000 MG/10ML IV SOLN
2000.0000 mg | Freq: Once | INTRAVENOUS | Status: AC
  Administered 2016-03-21: 2000 mg via TOPICAL
  Filled 2016-03-21: qty 20

## 2016-03-21 MED ORDER — ASPIRIN EC 325 MG PO TBEC
325.0000 mg | DELAYED_RELEASE_TABLET | Freq: Every day | ORAL | Status: DC
  Administered 2016-03-22 – 2016-03-24 (×3): 325 mg via ORAL
  Filled 2016-03-21 (×3): qty 1

## 2016-03-21 MED ORDER — ZOLPIDEM TARTRATE 5 MG PO TABS: 5.0000 mg | ORAL_TABLET | Freq: Every evening | ORAL | Status: DC | PRN

## 2016-03-21 MED ORDER — POTASSIUM CHLORIDE CRYS ER 10 MEQ PO TBCR
30.0000 meq | EXTENDED_RELEASE_TABLET | Freq: Three times a day (TID) | ORAL | Status: DC
  Administered 2016-03-21 – 2016-03-24 (×9): 30 meq via ORAL
  Filled 2016-03-21 (×19): qty 3

## 2016-03-21 MED ORDER — PHENOL 1.4 % MT LIQD: 1.0000 | OROMUCOSAL | Status: DC | PRN

## 2016-03-21 SURGICAL SUPPLY — 48 items
BLADE SURG ROTATE 9660 (MISCELLANEOUS) IMPLANT
CAPT HIP TOTAL 2 ×2 IMPLANT
COVER PERINEAL POST (MISCELLANEOUS) ×3 IMPLANT
COVER SURGICAL LIGHT HANDLE (MISCELLANEOUS) ×3 IMPLANT
DRAPE C-ARM 42X72 X-RAY (DRAPES) ×3 IMPLANT
DRAPE STERI IOBAN 125X83 (DRAPES) ×3 IMPLANT
DRAPE U-SHAPE 47X51 STRL (DRAPES) ×6 IMPLANT
DRSG AQUACEL AG ADV 3.5X10 (GAUZE/BANDAGES/DRESSINGS) ×3 IMPLANT
DURAPREP 26ML APPLICATOR (WOUND CARE) ×3 IMPLANT
ELECT BLADE 4.0 EZ CLEAN MEGAD (MISCELLANEOUS) ×3
ELECT REM PT RETURN 9FT ADLT (ELECTROSURGICAL) ×3
ELECTRODE BLDE 4.0 EZ CLN MEGD (MISCELLANEOUS) ×1 IMPLANT
ELECTRODE REM PT RTRN 9FT ADLT (ELECTROSURGICAL) ×1 IMPLANT
FACESHIELD WRAPAROUND (MASK) ×6 IMPLANT
FACESHIELD WRAPAROUND OR TEAM (MASK) ×2 IMPLANT
GLOVE BIO SURGEON STRL SZ7.5 (GLOVE) ×3 IMPLANT
GLOVE BIO SURGEON STRL SZ8.5 (GLOVE) ×3 IMPLANT
GLOVE BIOGEL PI IND STRL 8 (GLOVE) ×1 IMPLANT
GLOVE BIOGEL PI IND STRL 9 (GLOVE) ×1 IMPLANT
GLOVE BIOGEL PI INDICATOR 8 (GLOVE) ×2
GLOVE BIOGEL PI INDICATOR 9 (GLOVE) ×2
GOWN STRL REUS W/ TWL LRG LVL3 (GOWN DISPOSABLE) ×1 IMPLANT
GOWN STRL REUS W/ TWL XL LVL3 (GOWN DISPOSABLE) ×2 IMPLANT
GOWN STRL REUS W/TWL LRG LVL3 (GOWN DISPOSABLE) ×3
GOWN STRL REUS W/TWL XL LVL3 (GOWN DISPOSABLE) ×6
KIT BASIN OR (CUSTOM PROCEDURE TRAY) ×3 IMPLANT
KIT ROOM TURNOVER OR (KITS) ×3 IMPLANT
MANIFOLD NEPTUNE II (INSTRUMENTS) ×3 IMPLANT
NDL 22X1.5 STRL (OR ONLY) (MISCELLANEOUS) ×2 IMPLANT
NEEDLE 22X1 1/2 OR ONLY (MISCELLANEOUS) ×4
NEEDLE 22X1.5 STRL (OR ONLY) (MISCELLANEOUS) ×2 IMPLANT
NS IRRIG 1000ML POUR BTL (IV SOLUTION) ×3 IMPLANT
PACK TOTAL JOINT (CUSTOM PROCEDURE TRAY) ×3 IMPLANT
PAD ARMBOARD 7.5X6 YLW CONV (MISCELLANEOUS) ×6 IMPLANT
SAW OSC TIP CART 19.5X105X1.3 (SAW) ×3 IMPLANT
SUT VIC AB 1 CT1 27 (SUTURE) ×3
SUT VIC AB 1 CT1 27XBRD ANBCTR (SUTURE) IMPLANT
SUT VIC AB 1 CTX 36 (SUTURE) ×3
SUT VIC AB 1 CTX36XBRD ANBCTR (SUTURE) ×1 IMPLANT
SUT VIC AB 2-0 CT1 27 (SUTURE) ×6
SUT VIC AB 2-0 CT1 TAPERPNT 27 (SUTURE) ×2 IMPLANT
SUT VIC AB 3-0 PS2 18 (SUTURE) ×3
SUT VIC AB 3-0 PS2 18XBRD (SUTURE) ×1 IMPLANT
SYR CONTROL 10ML LL (SYRINGE) ×6 IMPLANT
TOWEL OR 17X24 6PK STRL BLUE (TOWEL DISPOSABLE) ×3 IMPLANT
TOWEL OR 17X26 10 PK STRL BLUE (TOWEL DISPOSABLE) ×3 IMPLANT
TRAY FOLEY CATH 14FR (SET/KITS/TRAYS/PACK) IMPLANT
WATER STERILE IRR 1000ML POUR (IV SOLUTION) ×1 IMPLANT

## 2016-03-21 NOTE — Anesthesia Postprocedure Evaluation (Signed)
Anesthesia Post Note  Patient: KRYSTIANA WOLLER  Procedure(s) Performed: Procedure(s) (LRB): TOTAL HIP ARTHROPLASTY ANTERIOR APPROACH (Right)  Patient location during evaluation: PACU Anesthesia Type: Spinal Level of consciousness: oriented and awake and alert Pain management: pain level controlled Vital Signs Assessment: post-procedure vital signs reviewed and stable Respiratory status: spontaneous breathing, respiratory function stable and patient connected to nasal cannula oxygen Cardiovascular status: blood pressure returned to baseline and stable Postop Assessment: no headache and no backache Anesthetic complications: no    Last Vitals:  Vitals:   03/21/16 1015 03/21/16 1030  BP: 117/88 108/71  Pulse:    Resp:    Temp:      Last Pain:  Vitals:   03/21/16 0945  TempSrc:   PainSc: 0-No pain    LLE Motor Response: Purposeful movement (able to left lt knee) (03/21/16 1030)   RLE Motor Response: No movement due to regional block (03/21/16 1030)   L Sensory Level: S1-Sole of foot, small toes (03/21/16 1030) R Sensory Level: L5-Outer lower leg, top of foot, great toe (03/21/16 1030)  Reginal Lutes

## 2016-03-21 NOTE — Evaluation (Signed)
Physical Therapy Evaluation Patient Details Name: Brandi [name] MRN: [id number] DOB: 02/17/1946 Today's Date: 03/21/2016   History of Present Illness  Pt is a 70 y/o female s/p R THA (direct anterior approach). PMH including but not limited to HTN and L THA in 2016.  Clinical Impression  Pt presenting sitting OOB on bedside commode when PT entered room. Pt's nurse tech in room as well. Pt was very limited secondary to lethargy and pain. Pt refusing to sit up in recliner or participate in any further evaluation at this time secondary to pain and reports of no sensation in R LE. Prior to admission, pt's spouse stated that she was independent with all ADL's and used a RW for ambulation. Pt would continue to benefit from skilled physical therapy services at this time while admitted and after d/c to address her below listed limitations in order to improve her overall safety and independence with functional mobility.     Follow Up Recommendations SNF    Equipment Recommendations  None recommended by PT    Recommendations for Other Services       Precautions / Restrictions Precautions Precautions: Fall Restrictions Weight Bearing Restrictions: Yes RLE Weight Bearing: Weight bearing as tolerated      Mobility  Bed Mobility Overal bed mobility: Needs Assistance Bed Mobility: Sit to Supine       Sit to supine: Max assist   General bed mobility comments: Pt required increased time and max A with bilateral LE movement back onto bed  Transfers Overall transfer level: Needs assistance Equipment used: Rolling walker (2 wheeled) Transfers: Sit to/from [facility] Sit to Stand: Mod assist Stand pivot transfers: Mod assist       General transfer comment: pt required increased time, VC'ing for bilateral hand placement and mod A to achieve full standing position  Ambulation/Gait             General Gait Details: did not occur secondary to pt c/o pain, lethargy and  sensation impairment  Stairs            Wheelchair Mobility    Modified Rankin (Stroke Patients Only)       Balance Overall balance assessment: Needs assistance Sitting-balance support: Feet supported;Bilateral upper extremity supported Sitting balance-Leahy Scale: Poor     Standing balance support: During functional activity;Bilateral upper extremity supported Standing balance-Leahy Scale: Poor Standing balance comment: pt reliant on bilateral UEs on RW in standing                             Pertinent Vitals/Pain Pain Assessment: 0-10 Pain Score: 9  Pain Location: R LE Pain Descriptors / Indicators: Grimacing;Guarding Pain Intervention(s): Monitored during session;Repositioned    Home Living Family/patient expects to be discharged to:: Skilled nursing facility Living Arrangements: Spouse/significant other                    Prior Function Level of Independence: Independent with assistive device(s)         Comments: Pt's spouse stated that pt ambulated with use of RW.      Hand Dominance        Extremity/Trunk Assessment   Upper Extremity Assessment: Generalized weakness           Lower Extremity Assessment: LLE deficits/detail   LLE Deficits / Details: Pt with decreased strength, ROM limitations and impaired sensation secondary to post-op.     Communication   Communication: No  difficulties  Cognition Arousal/Alertness: Lethargic;Suspect due to medications Behavior During Therapy: [facility] for tasks assessed/performed;Anxious Overall Cognitive Status: Within Functional Limits for tasks assessed                      General Comments      Exercises     Assessment/Plan    PT Assessment Patient needs continued PT services  PT Problem List Decreased strength;Decreased range of motion;Decreased activity tolerance;Decreased balance;Decreased mobility;Decreased coordination;Pain          PT Treatment Interventions  DME instruction;Gait training;Stair training;Functional mobility training;Therapeutic activities;Therapeutic exercise;Balance training;Neuromuscular re-education;Patient/family education    PT Goals (Current goals can be found in the Care Plan section)  Acute Rehab PT Goals Patient Stated Goal: go to rehab prior to returning home PT Goal Formulation: With patient/family Time For Goal Achievement: 04/04/16 Potential to Achieve Goals: Good    Frequency 7X/week   Barriers to discharge        Co-evaluation               End of Session Equipment Utilized During Treatment: Gait belt Activity Tolerance: Patient limited by lethargy;Patient limited by pain;Patient limited by fatigue Patient left: in bed;with call bell/phone within reach;with family/visitor present Nurse Communication: Mobility status         Time: [id number] PT Time Calculation (min) (ACUTE ONLY): 11 min   Charges:   PT Evaluation $PT Eval Moderate Complexity: 1 Procedure     PT G CodesClearnce Sorrel [name] 03/21/2016, 3:03 PM [name], [place], DPT [phone]

## 2016-03-21 NOTE — Anesthesia Procedure Notes (Signed)
Procedure Name: MAC Date/Time: 03/21/2016 7:40 AM Performed by: Candis Shine Pre-anesthesia Checklist: Patient identified, Emergency Drugs available, Suction available, Patient being monitored and Timeout performed Oxygen Delivery Method: Simple face mask Placement Confirmation: positive ETCO2,  CO2 detector and breath sounds checked- equal and bilateral Dental Injury: Teeth and Oropharynx as per pre-operative assessment

## 2016-03-21 NOTE — Op Note (Signed)
OPERATIVE REPORT    DATE OF PROCEDURE:  03/21/2016       PREOPERATIVE DIAGNOSIS:  RIGHT HIP PRIMARY OSTEOARTHRITIS M19.90                                                          POSTOPERATIVE DIAGNOSIS:  RIGHT HIP PRIMARY OSTEOARTHRITIS M19.90                                                           PROCEDURE: Anterior R total hip arthroplasty using a 50 mm DePuy Pinnacle  Cup, [employer], 0-degree polyethylene liner, a +1.5 32 mm ceramic head, a 2 Depuy Triloc stem   SURGEON: Akshath Mccarey J    ASSISTANT:   [employer]  (present throughout entire procedure and necessary for timely completion of the procedure)   ANESTHESIA: Spinal BLOOD LOSS: 300 FLUID REPLACEMENT: 1500 crystalloid Antibiotic: 2gm ancef Tranexamic Acid: 1gm Iv, 2gm topical COMPLICATIONS: none    INDICATIONS FOR PROCEDURE: A 70 y.o. year-old With  [place]arker M19.90   for 2 years, x-rays show bone-on-bone arthritic changes, and osteophytes. Despite conservative measures with observation, anti-inflammatory medicine, narcotics, use of a cane, has severe unremitting pain and can ambulate only a few blocks before resting. Patient desires elective R total hip arthroplasty to decrease pain and increase function. The risks, benefits, and alternatives were discussed at length including but not limited to the risks of infection, bleeding, nerve injury, stiffness, blood clots, the need for revision surgery, cardiopulmonary complications, among others, and they were willing to proceed. Questions answered     PROCEDURE IN DETAIL: The patient was identified by armband,  received preoperative IV antibiotics in the holding area at [facility], taken to the operating room , appropriate anesthetic monitors  were attached and  anesthesia was induced with the patienton the gurney. The HANA boots were applied to the feet and he was then transferred to the HANA table with a peroneal  post and support underneath the non-operative le, which was locked in 5 lb traction. Theoperative lower extremity was then prepped and draped in the usual sterile fashion from just above the iliac crest to the knee. And a timeout procedure was performed. We then made a 10 cm incision along the interval at the leading edge of the tensor fascia lata of starting at 2 cm lateral to and 2 cm distal to the ASIS. Small bleeders in the skin and subcutaneous tissue identified and cauterized we dissected down to the fascia and made an incision in the fascia allowing [place] to elevate the fascia of the tensor muscle and exploited the interval between the rectus and the tensor fascia lata. A Hohmann retractor was then placed along the superior neck of the femur and a Cobra retractor along the inferior neck of the femur we teed the capsule starting out at the superior anterior aspect of the acetabulum going distally and made the T along the neck both leaflets of the T were tagged with #2 Ethibond suture. Cobra retractors were then placed along the inferior and superior neck allowing [place] to perform  a standard neck cut and removed the femoral head with a power corkscrew. We then placed a right angle Hohmann retractor along the anterior aspect of the acetabulum a spiked Cobra in the cotyloid notch and posteriorly a Muelller retractor. We then sequentially reamed up to a 49 mm basket reamer obtaining good coverage in all quadrants, verified by C-arm imaging. Under C-arm control with and hammered into place a 50 mm Pinnacle cup in 45 of abduction and 15 of anteversion. The cup seated nicely and required no supplemental screws. We then placed a central hole Eliminator and a 0 polyethylene liner. The foot was then externally rotated to 110, the HANA elevator was placed around the flare of the greater trochanter and the limb was extended and abducted delivering the proximal femur up into the wound. A medium Hohmann retractor was placed  over the greater trochanter and a Mueller retractor along the posterior femoral neck completing the exposure. We then performed releases superiorly and and inferiorly of the capsule going back to the pirformis fossa superiorly and to the lesser trochanter inferiorly. We then entered the proximal femur with the box cutting offset chisel followed by, a canal sounder, the chili pepper and broaching up to a 2 broach. This seated nicely and we reamed the calcar. A trial reduction was performed with a 1.5 mm 32 mm head.The limb lengths were excellent the hip was stable in 90 of external rotation. At this point the trial components removed and we hammered into place a # 2 Tri-Lock stem with Gryption coating. This was a std offset stem and a + 1.5 32 mm ceramic ball was then hammered into place the hip was reduced and final C-arm images obtained. The wound was thoroughly irrigated with normal saline solution. We repaired the ant capsule and the tensor fascia lot a with running 0 vicryl suture. the subcutaneous tissue was closed with 2-0 and 3-0 Vicryl suture followed by an Aquacil dressing. At this point the patient was awaken and transferred to hospital gurney without difficulty. The subcutaneous tissue with 0 and 2-0 undyed Vicryl suture and the skin with running  3-0 vicryl subcuticular suture. Aquacil dressing was applied. The patient was then unclamped, rolled supine, awaken extubated and taken to recovery room without difficulty in stable condition.   Maximilien Hayashi J 03/21/2016, 9:03 AM

## 2016-03-21 NOTE — Anesthesia Procedure Notes (Signed)
Spinal  Patient location during procedure: OR Start time: 03/21/2016 7:28 AM End time: 03/21/2016 7:30 AM Staffing Anesthesiologist: Reginal Lutes Performed: anesthesiologist  Preanesthetic Checklist Completed: patient identified, site marked, surgical consent, pre-op evaluation, timeout performed, IV checked, risks and benefits discussed and monitors and equipment checked Spinal Block Patient position: sitting Prep: ChloraPrep Patient monitoring: cardiac monitor, continuous pulse ox, blood pressure and heart rate Approach: midline Location: L3-4 Injection technique: single-shot Needle Needle type: Pencil-Tip  Needle gauge: 25 G Assessment Sensory level: T6

## 2016-03-21 NOTE — Progress Notes (Signed)
PT Cancellation Note  Patient Details Name: Brandi [name] MRN: [id number] DOB: 07-10-1945   Cancelled Treatment:    Reason Eval/Treat Not Completed: Patient declined, no reason specified. Pt adamently refusing to participate in therapy session at this time secondary to pain and no sensation in her LEs. PT will re-attempt at a later time if available.   [name] Rion Schnitzer 03/21/2016, 2:23 PM [name], [place], DPT [phone]

## 2016-03-21 NOTE — Transfer of Care (Signed)
Immediate Anesthesia Transfer of Care Note  Patient: Brandi [name]  Procedure(s) Performed: Procedure(s): TOTAL HIP ARTHROPLASTY ANTERIOR APPROACH (Right)  Patient Location: PACU  Anesthesia Type:MAC and Spinal  Level of Consciousness: awake, alert  and oriented  Airway & Oxygen Therapy: Patient Spontanous Breathing  Post-op Assessment: Report given to RN and Post -op Vital signs reviewed and stable  Post vital signs: Reviewed and stable  Last Vitals:  Vitals:   03/21/16 0602  BP: 115/67  Pulse: 74  Resp: 18  Temp: 37.1 C    Last Pain:  Vitals:   03/21/16 0635  TempSrc:   PainSc: 8          Complications: No apparent anesthesia complications

## 2016-03-21 NOTE — Interval H&P Note (Signed)
No interval change, H&P updated

## 2016-03-21 NOTE — Discharge Instructions (Signed)

## 2016-03-22 LAB — CBC
HCT: 27.5 % — ABNORMAL LOW (ref 36.0–46.0)
HEMOGLOBIN: 8.7 g/dL — AB (ref 12.0–15.0)
MCH: 30.4 pg (ref 26.0–34.0)
MCHC: 31.6 g/dL (ref 30.0–36.0)
MCV: 96.2 fL (ref 78.0–100.0)
PLATELETS: 305 10*3/uL (ref 150–400)
RBC: 2.86 MIL/uL — AB (ref 3.87–5.11)
RDW: 14.4 % (ref 11.5–15.5)
WBC: 8 10*3/uL (ref 4.0–10.5)

## 2016-03-22 NOTE — Progress Notes (Signed)
Patient laying in bed, contents of her purse spilled onto the bed beside her. Prescription bottle noted. 54 Oxycodone-APAP 10-325 tablets inside the bottle. Count verified with the patient and then medication sent down to the pharmacy. Will continue to monitor patient.

## 2016-03-22 NOTE — Progress Notes (Signed)
    Patient doing well PO day 1 S/P R anterior hip replacement. Pain moderate, slow progress with PT today secondary to pain. Pt reports feels same as prior hip replacement and she "heals slowly." Denies SOB, drowsiness, N/V, normal B/B function, eating and drinking without difficulty, passing gas. Husband at bedside.    Physical Exam: Vitals:   03/22/16 0122 03/22/16 0528  BP: (!) 151/77 (!) 152/70  Pulse: 82 84  Resp:    Temp: 99 F (37.2 C) 99.6 F (37.6 C)   CBC Latest Ref Rng & Units 03/22/2016 03/21/2016 03/13/2016  WBC 4.0 - 10.5 K/uL 8.0 - 10.2  Hemoglobin 12.0 - 15.0 g/dL 8.7(L) 9.2(L) 11.5(L)  Hematocrit 36.0 - 46.0 % 27.5(L) 27.0(L) 36.4  Platelets 150 - 400 K/uL 305 - 547(H)   Dressing in place, CDI, sitting upright in recliner eating and resting comfortably, NVI  POD #1 s/p R anterior hip replacement per Dr Shaune Spittle team   - Continue WBAT and progress with PT - Monitor Hg, if <8 and symptomatic will consider transfusion tomorrow - up with PT/OT, encourage ambulation - Percocet for pain, Robaxinfor muscle spasms - ASA 325 for anticoagulation  - likely d/c SNF Sun vs Monday

## 2016-03-22 NOTE — Progress Notes (Signed)
OT Cancellation Note  Patient Details Name: Brandi [name] MRN: [id number] DOB: [birth year]   Cancelled Treatment:    Reason Eval/Treat Not Completed: Other (comment) (plan for pt to d/c to SNF; will defer OT to next venue). Please re-consult if needs change. Thank you for this referral.  Binnie Kand M.S., OTR/L Pager: [phone]  03/22/2016, 4:19 PM

## 2016-03-22 NOTE — Plan of Care (Signed)
Problem: Physical Regulation: Goal: Postoperative complications will be avoided or minimized Outcome: Progressing No complications noted  Problem: Pain Management: Goal: Pain level will decrease with appropriate interventions Outcome: Progressing Medicated once for pain this shift, denies pain at present moment

## 2016-03-22 NOTE — Progress Notes (Signed)
Physical Therapy Treatment Patient Details Name: Brandi [name] MRN: [id number] DOB: 1946-06-12 Today's Date: 03/22/2016    History of Present Illness Pt is a 70 y/o female s/p R THA (direct anterior approach). PMH including but not limited to HTN and L THA in 2016.    PT Comments    Pt presented supine in bed with HOB elevated, awake but very lethargic. Pt's nurse tech in room as well to perform hygiene as pt had voided in bed, refusing to transfer to bedside commode. Pt very limited secondary to fatigue, pain and lethargy again this session. The Stedy was used to transfer pt to recliner. Pt would continue to benefit from skilled physical therapy services at this time while admitted and after d/c to address her limitations in order to improve her overall safety and independence with functional mobility.   Follow Up Recommendations  SNF     Equipment Recommendations  None recommended by PT    Recommendations for Other Services       Precautions / Restrictions Precautions Precautions: Fall Restrictions Weight Bearing Restrictions: Yes RLE Weight Bearing: Weight bearing as tolerated    Mobility  Bed Mobility Overal bed mobility: Needs Assistance;+2 for physical assistance Bed Mobility: Supine to Sit     Supine to sit: Max assist;+2 for physical assistance     General bed mobility comments: pt required max A x2 and use of bed pad to position hips to achieve sitting EOB  Transfers Overall transfer level: Needs assistance Equipment used:  Charlaine Dalton) Transfers: Sit to/from Stand Sit to Stand: Mod assist;+2 physical assistance         General transfer comment: attempted x2 to perform STS with RW and max A x2 but unable to achieve full standing positiong. pt required increased time, VC'ing for bilateral hand placement and mod A to achieve full standing position  Ambulation/Gait                 Stairs            Wheelchair Mobility    Modified Rankin (Stroke  Patients Only)       Balance Overall balance assessment: Needs assistance Sitting-balance support: Feet supported;Bilateral upper extremity supported Sitting balance-Leahy Scale: Poor   Postural control: Left lateral lean Standing balance support: During functional activity;Bilateral upper extremity supported Standing balance-Leahy Scale: Poor                      Cognition Arousal/Alertness: Lethargic;Suspect due to medications Behavior During Therapy: [facility] for tasks assessed/performed;Anxious Overall Cognitive Status: Within Functional Limits for tasks assessed                      Exercises      General Comments        Pertinent Vitals/Pain Pain Assessment: 0-10 Pain Score: 9  Pain Location: R LE Pain Descriptors / Indicators: Grimacing;Guarding;Moaning Pain Intervention(s): Monitored during session;Repositioned    Home Living                      Prior Function            PT Goals (current goals can now be found in the care plan section) Acute Rehab PT Goals Patient Stated Goal: go to rehab prior to returning home PT Goal Formulation: With patient/family Time For Goal Achievement: 04/04/16 Potential to Achieve Goals: Good Progress towards PT goals: Progressing toward goals    Frequency    7X/week  PT Plan Current plan remains appropriate    Co-evaluation             End of Session Equipment Utilized During Treatment: Gait belt Activity Tolerance: Patient limited by lethargy;Patient limited by pain Patient left: in chair;with call bell/phone within reach;with nursing/sitter in room (nurse tech in room)     Time: [id number] PT Time Calculation (min) (ACUTE ONLY): 26 min  Charges:  $Therapeutic Activity: 23-37 mins                    G CodesClearnce Sorrel Maneh Sieben 04/12/16, 1:34 PM [name], [place], DPT [phone]

## 2016-03-23 ENCOUNTER — Encounter (HOSPITAL_COMMUNITY): Payer: Self-pay | Admitting: *Deleted

## 2016-03-23 LAB — URINALYSIS, ROUTINE W REFLEX MICROSCOPIC
Bilirubin Urine: NEGATIVE
GLUCOSE, UA: NEGATIVE mg/dL
HGB URINE DIPSTICK: NEGATIVE
Ketones, ur: NEGATIVE mg/dL
Leukocytes, UA: NEGATIVE
Nitrite: NEGATIVE
PH: 7 (ref 5.0–8.0)
Protein, ur: NEGATIVE mg/dL
SPECIFIC GRAVITY, URINE: 1.02 (ref 1.005–1.030)

## 2016-03-23 LAB — CBC
HCT: 24.8 % — ABNORMAL LOW (ref 36.0–46.0)
Hemoglobin: 8.2 g/dL — ABNORMAL LOW (ref 12.0–15.0)
MCH: 31.5 pg (ref 26.0–34.0)
MCHC: 33.1 g/dL (ref 30.0–36.0)
MCV: 95.4 fL (ref 78.0–100.0)
PLATELETS: 257 10*3/uL (ref 150–400)
RBC: 2.6 MIL/uL — ABNORMAL LOW (ref 3.87–5.11)
RDW: 14.8 % (ref 11.5–15.5)
WBC: 12.2 10*3/uL — AB (ref 4.0–10.5)

## 2016-03-23 LAB — TYPE AND SCREEN
ABO/RH(D): O POS
Antibody Screen: NEGATIVE
UNIT DIVISION: 0
Unit division: 0

## 2016-03-23 LAB — PREPARE RBC (CROSSMATCH)

## 2016-03-23 MED ORDER — SODIUM CHLORIDE 0.9 % IV SOLN
Freq: Once | INTRAVENOUS | Status: AC
  Administered 2016-03-23: 17:00:00 via INTRAVENOUS

## 2016-03-23 MED ORDER — SODIUM CHLORIDE 0.9 % IV SOLN: Freq: Once | INTRAVENOUS | Status: DC

## 2016-03-23 NOTE — Progress Notes (Signed)
PT Cancellation Note  Patient Details Name: Brandi [name] MRN: [id number] DOB: 31-Aug-1945   Cancelled Treatment:    Reason Eval/Treat Not Completed: Fatigue/lethargy limiting ability to participate;Medical issues which prohibited therapy. Patient reports not feeling well today.  She is scheduled to receive 2 units of blood and deferred treatment today.  Will resume PT tomorrow.   Shanna Cisco 03/23/2016, 3:00 PM

## 2016-03-23 NOTE — Progress Notes (Signed)
    Patient doing well PO day 2 S/P R anterior hip replacement. Pain moderate, slow progress with PT/OT secondary to pain and lethargy. Pt reports she did not sleep much last night, was awoken for blood draw at 3am with difficulty obtaining a vein keeping her up until 5am. She reports feeling more lethargic today and mild SOB, fatigue. No N/V, normal B/B function, eating and drinking without difficulty, passing gas. Husband at bedside.    Physical Exam: Vitals:   03/23/16 0443 03/23/16 1136  BP: 118/71 (!) 161/77  Pulse: 75 77  Resp:  17  Temp: 97.5 F (36.4 C) 98.7 F (37.1 C)   CBC    Component Value Date/Time   WBC 12.2 (H) 03/23/2016 0337   RBC 2.60 (L) 03/23/2016 0337   HGB 8.2 (L) 03/23/2016 0337   HCT 24.8 (L) 03/23/2016 0337   PLT 257 03/23/2016 0337   MCV 95.4 03/23/2016 0337   MCH 31.5 03/23/2016 0337   MCHC 33.1 03/23/2016 0337   RDW 14.8 03/23/2016 0337   LYMPHSABS 2.4 03/13/2016 1019   MONOABS 0.7 03/13/2016 1019   EOSABS 0.6 03/13/2016 1019   BASOSABS 0.0 03/13/2016 1019    Dressing in place, CDI, pt laying in bed comfortably, distal compartments soft,  NVI  POD #2 s/p R ANT THR per Dr Mayer Camel   - Hg continues to drop, symptomatic today at 8.2 for PO blood loss anemia  -Will transfuse PRBC's 2U, discussed with pt and she voices undertsnding and willingness  - Repeat H&H ordered for after transfusion  - Will obtain UA to r/o UTI - up with PT/OT, encourage ambulation  -WBAT - Percocet for pain, Robaxin for muscle spasms - likely d/c SNF Monday vs Tuesday pending anemia and PT progress

## 2016-03-24 ENCOUNTER — Encounter (HOSPITAL_COMMUNITY): Payer: Self-pay | Admitting: Orthopedic Surgery

## 2016-03-24 DIAGNOSIS — E876 Hypokalemia: Secondary | ICD-10-CM | POA: Diagnosis not present

## 2016-03-24 DIAGNOSIS — Z471 Aftercare following joint replacement surgery: Secondary | ICD-10-CM | POA: Diagnosis not present

## 2016-03-24 DIAGNOSIS — R300 Dysuria: Secondary | ICD-10-CM | POA: Diagnosis not present

## 2016-03-24 DIAGNOSIS — I10 Essential (primary) hypertension: Secondary | ICD-10-CM | POA: Diagnosis not present

## 2016-03-24 DIAGNOSIS — M62838 Other muscle spasm: Secondary | ICD-10-CM | POA: Diagnosis not present

## 2016-03-24 DIAGNOSIS — D62 Acute posthemorrhagic anemia: Secondary | ICD-10-CM | POA: Diagnosis not present

## 2016-03-24 DIAGNOSIS — K5901 Slow transit constipation: Secondary | ICD-10-CM | POA: Diagnosis not present

## 2016-03-24 DIAGNOSIS — G4700 Insomnia, unspecified: Secondary | ICD-10-CM | POA: Diagnosis not present

## 2016-03-24 DIAGNOSIS — K219 Gastro-esophageal reflux disease without esophagitis: Secondary | ICD-10-CM | POA: Diagnosis not present

## 2016-03-24 DIAGNOSIS — Z96641 Presence of right artificial hip joint: Secondary | ICD-10-CM | POA: Diagnosis not present

## 2016-03-24 DIAGNOSIS — R2681 Unsteadiness on feet: Secondary | ICD-10-CM | POA: Diagnosis not present

## 2016-03-24 DIAGNOSIS — M1611 Unilateral primary osteoarthritis, right hip: Secondary | ICD-10-CM | POA: Diagnosis not present

## 2016-03-24 DIAGNOSIS — M6281 Muscle weakness (generalized): Secondary | ICD-10-CM | POA: Diagnosis not present

## 2016-03-24 DIAGNOSIS — K5909 Other constipation: Secondary | ICD-10-CM | POA: Diagnosis not present

## 2016-03-24 LAB — TYPE AND SCREEN
ABO/RH(D): O POS
Antibody Screen: NEGATIVE
UNIT DIVISION: 0
Unit division: 0

## 2016-03-24 LAB — CBC
HEMATOCRIT: 34.2 % — AB (ref 36.0–46.0)
HEMOGLOBIN: 10.8 g/dL — AB (ref 12.0–15.0)
MCH: 29.3 pg (ref 26.0–34.0)
MCHC: 31.6 g/dL (ref 30.0–36.0)
MCV: 92.7 fL (ref 78.0–100.0)
Platelets: 238 10*3/uL (ref 150–400)
RBC: 3.69 MIL/uL — AB (ref 3.87–5.11)
RDW: 16.1 % — ABNORMAL HIGH (ref 11.5–15.5)
WBC: 10.2 10*3/uL (ref 4.0–10.5)

## 2016-03-24 NOTE — Progress Notes (Addendum)
SW has spoken with pt. She has selected [facility] to discharge to upon rehab. SW spoke with admissions/Regina who confirms that the pt has been accepted. SW made pt and husband aware. SW made nurse aware.  Patient states that husband will transport her to facility. Nurse Report Number: [phone]  Tilda Burrow, MSW [phone]

## 2016-03-24 NOTE — NC FL2 (Signed)
[place] MEDICAID FL2 LEVEL OF CARE SCREENING TOOL     IDENTIFICATION  Patient Name: Brandi [name] Birthdate: 01/20/1946 Sex: female Admission Date (Current Location): 03/21/2016  [facility] and [place] Number:  Herbalist and Address:  [facility] [place], [place] [address] [place], [place] [address]      Provider Number: [id number]  Attending Physician Name and Address:  Frederik Pear, MD  Relative Name and Phone Number:       Current Level of Care: Hospital Recommended Level of Care: [place] Prior Approval Number:    Date Approved/Denied:   PASRR Number: [id number]  Discharge Plan: Home    Current Diagnoses: Patient Active Problem List   Diagnosis Date Noted  . Arthritis of right hip 03/21/2016  . Primary osteoarthritis of right hip 03/20/2016  . Right ear impacted cerumen 03/12/2016  . Fall at home 10/21/2015  . Leg pain 09/27/2015  . Fatigue 04/22/2015  . Primary osteoarthritis of left hip 02/25/2015  . Pre-op evaluation 02/22/2015  . Sinusitis, chronic 01/30/2015  . Insomnia 01/30/2015  . Lymphadenopathy 11/24/2014  . Hypokalemia 06/15/2014  . Rectal bleeding 06/15/2014  . AKI (acute kidney injury) ([place]) 06/15/2014  . Essential hypertension, benign 04/10/2014  . Tachycardia 04/10/2014  . GERD (gastroesophageal reflux disease) 11/08/2013  . Chronic constipation 11/08/2013    Orientation RESPIRATION BLADDER Height & Weight     Self, Situation, Time, Place  Normal Continent Weight: 139 lb (63 kg) Height:  5\' 4"  (162.6 cm)  BEHAVIORAL SYMPTOMS/MOOD NEUROLOGICAL BOWEL NUTRITION STATUS      Continent Diet  AMBULATORY STATUS COMMUNICATION OF NEEDS Skin   Limited Assist Verbally Surgical wounds                       Personal Care Assistance Level of Assistance  Bathing, Dressing Bathing Assistance: Limited assistance   Dressing Assistance: Limited assistance     Functional Limitations Info              SPECIAL CARE FACTORS FREQUENCY  PT (By licensed PT), OT (By licensed OT)     PT Frequency: daily OT Frequency: daily            Contractures Contractures Info: Not present    Additional Factors Info  Allergies   Allergies Info: asprin           Current Medications (03/24/2016):  This is the current hospital active medication list Current Facility-Administered Medications  Medication Dose Route Frequency Provider Last Rate Last Dose  . 0.9 %  sodium chloride infusion   Intravenous Once [employer], PA-C      . 0.9 %  sodium chloride infusion   Intravenous Once [employer], PA-C      . acetaminophen (TYLENOL) tablet 650 mg  650 mg Oral Q6H PRN Leighton Parody, PA-C   650 mg at 03/23/16 1434   Or  . acetaminophen (TYLENOL) suppository 650 mg  650 mg Rectal Q6H PRN Leighton Parody, PA-C      . alum & mag hydroxide-simeth (MAALOX/MYLANTA) 200-200-20 MG/5ML suspension 30 mL  30 mL Oral Q4H PRN Leighton Parody, PA-C      . aspirin EC tablet 325 mg  325 mg Oral Q breakfast Leighton Parody, PA-C   325 mg at 03/24/16 [id number]  . bisacodyl (DULCOLAX) EC tablet 5 mg  5 mg Oral Daily PRN Leighton Parody, PA-C      . dextrose  5 % and 0.45 % NaCl with KCl 20 mEq/L infusion   Intravenous Continuous Leighton Parody, PA-C 125 mL/hr at 03/24/16 0023    . diazepam (VALIUM) tablet 10 mg  10 mg Oral QHS PRN Leighton Parody, PA-C      . diphenhydrAMINE (BENADRYL) 12.5 MG/5ML elixir 12.5-25 mg  12.5-25 mg Oral Q4H PRN Leighton Parody, PA-C      . docusate sodium (COLACE) capsule 100 mg  100 mg Oral BID Leighton Parody, PA-C   100 mg at 03/24/16 [id number]  . estrogen (conjugated)-medroxyprogesterone (PREMPRO) 0.3-1.5 MG per tablet 1 tablet  1 tablet Oral QHS Leighton Parody, PA-C   1 tablet at 03/23/16 2334  . famotidine (PEPCID) tablet 20 mg  20 mg Oral BID Leighton Parody, PA-C   20 mg at 03/24/16 [id number]  . HYDROmorphone (DILAUDID) injection 1 mg  1 mg Intravenous Q2H PRN Leighton Parody, PA-C       . menthol-cetylpyridinium (CEPACOL) lozenge 3 mg  1 lozenge Oral PRN Leighton Parody, PA-C       Or  . phenol (CHLORASEPTIC) mouth spray 1 spray  1 spray Mouth/Throat PRN Leighton Parody, PA-C      . methocarbamol (ROBAXIN) tablet 500 mg  500 mg Oral Q6H PRN Leighton Parody, PA-C   500 mg at 03/24/16 [id number]   Or  . methocarbamol (ROBAXIN) 500 mg in dextrose 5 % 50 mL IVPB  500 mg Intravenous Q6H PRN Leighton Parody, PA-C      . metoCLOPramide (REGLAN) tablet 5-10 mg  5-10 mg Oral Q8H PRN Leighton Parody, PA-C       Or  . metoCLOPramide (REGLAN) injection 5-10 mg  5-10 mg Intravenous Q8H PRN Leighton Parody, PA-C      . ondansetron [facility]) tablet 4 mg  4 mg Oral Q6H PRN Leighton Parody, PA-C       Or  . ondansetron [facility]) injection 4 mg  4 mg Intravenous Q6H PRN Leighton Parody, PA-C      . oxyCODONE (Oxy IR/ROXICODONE) immediate release tablet 5-10 mg  5-10 mg Oral Q3H PRN Leighton Parody, PA-C   5 mg at 03/24/16 [id number]  . potassium chloride (K-DUR) CR tablet 30 mEq  30 mEq Oral TID Leighton Parody, PA-C   30 mEq at 03/24/16 [id number]  . senna-docusate (Senokot-S) tablet 1 tablet  1 tablet Oral QHS PRN Leighton Parody, PA-C      . sodium phosphate (FLEET) 7-19 GM/118ML enema 1 enema  1 enema Rectal Once PRN Leighton Parody, PA-C      . temazepam (RESTORIL) capsule 30 mg  30 mg Oral QHS PRN Leighton Parody, PA-C      . traZODone (DESYREL) tablet 50 mg  50 mg Oral QHS Leighton Parody, PA-C   50 mg at 03/23/16 2318     Discharge Medications: Please see discharge summary for a list of discharge medications.  Relevant Imaging Results:  Relevant Lab Results:   Additional Information SSN: [id number]  Dulcy Fanny, LCSW

## 2016-03-24 NOTE — Clinical Social Work Placement (Signed)
   CLINICAL SOCIAL WORK PLACEMENT  NOTE  Date:  03/24/2016  Patient Details  Name: Brandi [name] MRN: [id number] Date of Birth: 1945-09-23  Clinical Social Work is seeking post-discharge placement for this patient at the [place] level of care (*CSW will initial, date and re-position this form in  chart as items are completed):  Yes   Patient/family provided with [place] Work Department's list of facilities offering this level of care within the geographic area requested by the patient (or if unable, by the patient's family).  Yes   Patient/family informed of their freedom to choose among providers that offer the needed level of care, that participate in Medicare, Medicaid or managed care program needed by the patient, have an available bed and are willing to accept the patient.  Yes   Patient/family informed of [facility]'s ownership interest in [facility] and [facility], as well as of the fact that they are under no obligation to receive care at these facilities.  PASRR submitted to EDS on       PASRR number received on       Existing PASRR number confirmed on       FL2 transmitted to all facilities in geographic area requested by pt/family on       FL2 transmitted to all facilities within larger geographic area on       Patient informed that his/her managed care company has contracts with or will negotiate with certain facilities, including the following:        Yes   Patient/family informed of bed offers received.  Patient chooses bed at       Physician recommends and patient chooses bed at      Patient to be transferred to   on  .  Patient to be transferred to facility by       Patient family notified on   of transfer.  Name of family member notified:   (Husband Alfonzo)     PHYSICIAN       Additional Comment:    _______________________________________________ Bernita Buffy 03/24/2016, 3:32 PM

## 2016-03-24 NOTE — Progress Notes (Signed)
PATIENT ID: Brandi [name]  MRN: [id number]  DOB/AGE:  68-Feb-1947 / 68 y.o.  68 Days Post-Op Procedure(s) (LRB): TOTAL HIP ARTHROPLASTY ANTERIOR APPROACH (Right)    PROGRESS NOTE Subjective: Patient is alert, oriented, no Nausea, no Vomiting, yes passing gas, . Taking PO with small bites and sips. Denies SOB, Chest or Calf Pain. Using Incentive Spirometer, PAS in place. Ambulate WBAT with pt deferring therapy yesterday Patient reports pain as  8/10  .    Objective: Vital signs in last 24 hours: Vitals:   03/23/16 2115 03/23/16 2130 03/24/16 0015 03/24/16 0415  BP: (!) 156/84 (!) 157/83 (!) 171/88 (!) 172/86  Pulse:   80 73  Resp:    16  Temp: 99 F (37.2 C) 99 F (37.2 C) 98.4 F (36.9 C) 98.4 F (36.9 C)  TempSrc: Oral  Oral Oral  SpO2: 100% 100% 100% 98%  Weight:      Height:          Intake/Output from previous day: I/O last 3 completed shifts: In: 6100.8 [I.V.:4947.8; Blood:1153] Out: -    Intake/Output this shift: No intake/output data recorded.   LABORATORY DATA:  Recent Labs  03/23/16 0337 03/24/16 0411  WBC 12.2* 10.2  HGB 8.2* 10.8*  HCT 24.8* 34.2*  PLT 257 238    Examination: Neurologically intact Neurovascular intact Sensation intact distally Intact pulses distally Dorsiflexion/Plantar flexion intact Incision: dressing C/D/I No cellulitis present Compartment soft} XR AP&Lat of hip shows well placed\fixed THA  Assessment:   68 Days Post-Op Procedure(s) (LRB): TOTAL HIP ARTHROPLASTY ANTERIOR APPROACH (Right) ADDITIONAL DIAGNOSIS:  Expected Acute Blood Loss Anemia, Hypertension  Plan: PT/OT WBAT, THA  DVT Prophylaxis: SCDx72 hrs, ASA 325 mg BID x 2 weeks  DISCHARGE PLAN: Skilled Nursing Facility/Rehab, today if bed available  DISCHARGE NEEDS: HHPT, HHRN, Walker and 3-in-1 comode seat

## 2016-03-24 NOTE — Clinical Social Work Note (Signed)
Clinical Social Work Assessment  Patient Details  Name: Brandi [name] MRN: [id number] Date of Birth: 09-28-1945  Date of referral:  03/24/16               Reason for consult:  Facility Placement                Permission sought to share information with:   Acupuncturist) Permission granted to share information::   Acupuncturist)  Name::        Agency::     Relationship::     Contact Information:     Housing/Transportation Living arrangements for the past 2 months:  Single Family Home (Patient lives at home with her husband in [place].) Source of Information:  Patient Patient Interpreter Needed:  None Criminal Activity/Legal Involvement Pertinent to Current Situation/Hospitalization:  No - Comment as needed Significant Relationships:  None Lives with:  Spouse Do you feel safe going back to the place where you live?   (Patient is interested in SNF. Patient will be [facility] to [facility].) Need for family participation in patient care:  Yes (Comment)  Care giving concerns:  Patient states that prior to coming to hospital she was able to complete ADLs independently. However, she now states that due to her having surgery she needs assistance with ADLs and would be interested in facility.    Social Worker assessment / plan:  SW met with patient at bedside. Husband was present. Patient was alert and oriented. Patient states that she presents to hospital due to receiving surgery for her R hip. SW will refer pt to facilities. Patient states that her husband is her primary support. Husband/Alfonzo [phone]  Employment status:  Retired Forensic scientist:  Medicare PT Recommendations:  [place] / Referral to community resources:   (SNF)  Patient/Family's Response to care:  Patient and husband are appropriate.  Patient/Family's Understanding of and Emotional Response to Diagnosis, Current Treatment, and Prognosis:  Patient and husband have no questions for  SW at this time.  Emotional Assessment Appearance:  Appears stated age Attitude/Demeanor/Rapport:   (Appropriate.) Affect (typically observed):  Accepting Orientation:  Oriented to Self, Oriented to Place, Oriented to  Time, Oriented to Situation Alcohol / Substance use:  Not Applicable Psych involvement (Current and /or in the community):  No (Comment)  Discharge Needs  Concerns to be addressed:  No discharge needs identified Readmission within the last 30 days:  No Current discharge risk:  None Barriers to Discharge:  No Barriers Identified   Tilda Burrow R 03/24/2016, 3:26 PM

## 2016-03-24 NOTE — Telephone Encounter (Signed)
Open in error

## 2016-03-24 NOTE — Care Management Important Message (Signed)
Important Message  Patient Details  Name: Brandi [name] MRN: [id number] Date of Birth: [birth year]   Medicare Important Message Given:  Yes    Ania Levay 03/24/2016, 11:56 AM

## 2016-03-24 NOTE — Progress Notes (Signed)
Physical Therapy Treatment Patient Details Name: Brandi [name] MRN: [id number] DOB: 1946/02/15 Today's Date: 03/24/2016    History of Present Illness Pt is a 70 y/o female s/p R THA (direct anterior approach). PMH including but not limited to HTN and L THA in 2016.    PT Comments    Pt performed increased mobility and progressed to gait training.  Pt remains to present with poor safety awareness and will continue to benefit from short term SNF stay to improve safety and functional mobility before returning home.    Follow Up Recommendations  SNF     Equipment Recommendations  None recommended by PT    Recommendations for Other Services       Precautions / Restrictions Precautions Precautions: Fall Restrictions Weight Bearing Restrictions: Yes RLE Weight Bearing: Weight bearing as tolerated    Mobility  Bed Mobility Overal bed mobility: Needs Assistance         Sit to supine: Min assist   General bed mobility comments: Pt required min assist to lift RLE back into bed against gravity.  Pt able to boost up in bed with use of rails.    Transfers Overall transfer level: Needs assistance Equipment used: Rolling walker (2 wheeled) Transfers: Sit to/from Stand Sit to Stand: Min guard         General transfer comment: Cues for hand placement.  Pt required cues for positioning before performing transfer to ensure safety is met.    Ambulation/Gait Ambulation/Gait assistance: Supervision Ambulation Distance (Feet): 64 Feet Assistive device: Rolling walker (2 wheeled) Gait Pattern/deviations: Step-through pattern;Trunk flexed     General Gait Details: Cues for RW safety and to keep RW close to allow body to be positioned within RW.  Pt tolerated tx well.  Safety remains to be patients largest limiting factor.     Stairs Stairs: Yes          Wheelchair Mobility    Modified Rankin (Stroke Patients Only)       Balance Overall balance assessment: Needs  assistance   Sitting balance-Leahy Scale: Good       Standing balance-Leahy Scale: Poor                      Cognition Arousal/Alertness: Awake/alert Behavior During Therapy: WFL for tasks assessed/performed;Anxious Overall Cognitive Status: Within Functional Limits for tasks assessed                      Exercises Total Joint Exercises Ankle Circles/Pumps: AROM;Both;10 reps;Supine Quad Sets: AROM;Right;10 reps;Supine Short Arc Quad: AROM;Right;10 reps;Supine Heel Slides: AAROM;Right;10 reps;Supine Hip ABduction/ADduction: AAROM;Right;10 reps;Supine    General Comments        Pertinent Vitals/Pain Pain Assessment: 0-10 Pain Score: 4  Pain Location: R LE Pain Descriptors / Indicators: Operative site guarding;Sore Pain Intervention(s): Monitored during session;Repositioned    Home Living                      Prior Function            PT Goals (current goals can now be found in the care plan section) Acute Rehab PT Goals Patient Stated Goal: go to rehab prior to returning home Potential to Achieve Goals: Good Progress towards PT goals: Progressing toward goals    Frequency    7X/week      PT Plan Current plan remains appropriate    Co-evaluation  End of Session Equipment Utilized During Treatment: Gait belt Activity Tolerance: Patient limited by lethargy;Patient limited by pain Patient left: in bed;with call bell/phone within reach     Time: 1009-1028 PT Time Calculation (min) (ACUTE ONLY): 19 min  Charges:  $Therapeutic Activity: 8-22 mins                    G Codes:      Cristela Blue 2016/04/21, 10:38 AM  Governor Rooks, PTA pager [phone]

## 2016-03-24 NOTE — Progress Notes (Signed)
Report called to [facility] at [facility]

## 2016-03-24 NOTE — Discharge Summary (Signed)
Patient ID: Brandi [name] MRN: [id number] DOB/AGE: [birth year] 70 y.o.  Admit date: 03/21/2016 Discharge date: 03/24/2016  Admission Diagnoses:  Principal Problem:   Primary osteoarthritis of right hip Active Problems:   Arthritis of right hip   Discharge Diagnoses:  Same  Past Medical History:  Diagnosis Date  . Anxiety    takes Valium daily as needed  . Chronic back pain   . Chronic back pain   . Complication of anesthesia 1980s   "I stopped breathing on the table; I was gettin my wisdom teeth extracted"  . Constipation   . Depression    takes Lexapro daily  . Dysrhythmia    pt. reports that her heart skips sometimes   . GERD (gastroesophageal reflux disease)    takes Zantac daily  . History of blood transfusion    no abnormal reaction noted  . History of colon polyps    benign  . History of gastric ulcer   . History of GI bleed   . History of hiatal hernia   . History of stress test    done in [place]- done in  1992, told that it was normal.   . Hypertension   . Insomnia    takes Restoril and Trazodone nightly as needed  . Joint pain   . Joint swelling   . Nocturia   . Osteoarthritis   . Peripheral edema    occasionally but never been on fluid pill    Surgeries: Procedure(s): TOTAL HIP ARTHROPLASTY ANTERIOR APPROACH on 03/21/2016   Consultants:   Discharged Condition: Improved  Hospital Course: Brandi [name] is an 70 y.o. female who was admitted 03/21/2016 for operative treatment ofPrimary osteoarthritis of right hip. Patient has severe unremitting pain that affects sleep, daily activities, and work/hobbies. After pre-op clearance the patient was taken to the operating room on 03/21/2016 and underwent  Procedure(s): TOTAL HIP ARTHROPLASTY ANTERIOR APPROACH.    Patient was given perioperative antibiotics: Anti-infectives    Start     Dose/Rate Route Frequency Ordered Stop   03/21/16 0612  ceFAZolin (ANCEF) IVPB 2g/100 mL premix     2 g 200 mL/hr over 30  Minutes Intravenous On call to O.R. 03/21/16 0612 03/21/16 0740       Patient was given sequential compression devices, early ambulation, and chemoprophylaxis to prevent DVT.  Patient benefited maximally from hospital stay and there were no complications.    Recent vital signs: Patient Vitals for the past 24 hrs:  BP Temp Temp src Pulse Resp SpO2  03/24/16 0415 (!) 172/86 98.4 F (36.9 C) Oral 73 16 98 %  03/24/16 0015 (!) 171/88 98.4 F (36.9 C) Oral 80 - 100 %  03/23/16 2130 (!) 157/83 99 F (37.2 C) - - - 100 %  03/23/16 2115 (!) 156/84 99 F (37.2 C) Oral - - 100 %  03/23/16 2023 (!) 154/80 99 F (37.2 C) Oral 77 16 100 %  03/23/16 1658 (!) 143/76 98.7 F (37.1 C) Oral 86 - 99 %  03/23/16 1614 138/70 98.6 F (37 C) Oral 80 16 100 %  03/23/16 1136 (!) 161/77 98.7 F (37.1 C) Oral 77 17 98 %     Recent laboratory studies:  Recent Labs  03/23/16 0337 03/24/16 0411  WBC 12.2* 10.2  HGB 8.2* 10.8*  HCT 24.8* 34.2*  PLT 257 238     Discharge Medications:     Medication List    STOP taking these medications   ibuprofen 600 MG tablet  Commonly known as:  ADVIL,MOTRIN     TAKE these medications   amoxicillin 500 MG tablet Commonly known as:  AMOXIL Take 2,000 mg by mouth See admin instructions. Take 4 tablets (2000 mg) by mouth one before dental procedures - last procedure 03/06/16   aspirin EC 325 MG tablet Take 1 tablet (325 mg total) by mouth 2 (two) times daily.   cyclobenzaprine 5 MG tablet Commonly known as:  FLEXERIL Take 1 tablet (5 mg total) by mouth 3 (three) times daily.   diazepam 10 MG tablet Commonly known as:  VALIUM TAKE 1 TABLET BY MOUTH EVERY 12 HOURS AS NEEDED FOR ANXIETY What changed:  how much to take  how to take this  when to take this  reasons to take this  additional instructions   diclofenac sodium 1 % Gel Commonly known as:  VOLTAREN APPLY 2 G TOPICALLY 4 (FOUR) TIMES DAILY. What changed:  how much to take  how to  take this  when to take this  reasons to take this  additional instructions   estrogen (conjugated)-medroxyprogesterone 0.3-1.5 MG tablet Commonly known as:  PREMPRO Take 1 tablet by mouth daily. What changed:  when to take this   Eszopiclone 3 MG Tabs Take 3 mg by mouth at bedtime as needed (sleep). Lunesta   hydrochlorothiazide 25 MG tablet Commonly known as:  HYDRODIURIL Take 1 tablet (25 mg total) by mouth daily.   hydrocortisone cream 1 % Apply to affected area 2 times daily   oxyCODONE-acetaminophen 10-325 MG tablet Commonly known as:  PERCOCET Take 1 tablet by mouth every 4 (four) hours as needed for pain. Pt. Reports that she sometimes takes 2 tabs. What changed:  how much to take  additional instructions   potassium chloride 10 MEQ tablet Commonly known as:  K-DUR Take 3 tablets (30 mEq total) by mouth 3 (three) times daily.   ranitidine 150 MG tablet Commonly known as:  ZANTAC Take 1 tablet (150 mg total) by mouth 2 (two) times daily.   temazepam 15 MG capsule Commonly known as:  RESTORIL 1-2 po qhs prn   traZODone 50 MG tablet Commonly known as:  DESYREL TAKE 1 TABLET BY MOUTH AT BEDTIME       Diagnostic Studies: Dg C-arm 1-60 Min  Result Date: 03/21/2016 CLINICAL DATA:  Anterior approach right hip arthroplasty EXAM: OPERATIVE RIGHT HIP (WITH PELVIS IF PERFORMED) 2 VIEWS TECHNIQUE: Fluoroscopic spot image(s) were submitted for interpretation post-operatively. COMPARISON:  09/27/2015 right hip radiographs FINDINGS: Fluoroscopy time 0 minutes 19 seconds. Two spot fluoroscopic nondiagnostic intraoperative right hip radiographs demonstrate postsurgical changes from right total hip arthroplasty, with well situated right hip hardware on these limited frontal views. IMPRESSION: Intraoperative fluoroscopic guidance for right total hip arthroplasty. Electronically Signed   By: Ilona Sorrel M.D.   On: 03/21/2016 09:15   Dg Hip Operative Unilat W Or W/o Pelvis  Right  Result Date: 03/21/2016 CLINICAL DATA:  Anterior approach right hip arthroplasty EXAM: OPERATIVE RIGHT HIP (WITH PELVIS IF PERFORMED) 2 VIEWS TECHNIQUE: Fluoroscopic spot image(s) were submitted for interpretation post-operatively. COMPARISON:  09/27/2015 right hip radiographs FINDINGS: Fluoroscopy time 0 minutes 19 seconds. Two spot fluoroscopic nondiagnostic intraoperative right hip radiographs demonstrate postsurgical changes from right total hip arthroplasty, with well situated right hip hardware on these limited frontal views. IMPRESSION: Intraoperative fluoroscopic guidance for right total hip arthroplasty. Electronically Signed   By: Ilona Sorrel M.D.   On: 03/21/2016 09:15    Disposition: 01-Home or Self Care  Discharge Instructions    Call MD / Call 911    Complete by:  As directed    If you experience chest pain or shortness of breath, CALL 911 and be transported to the hospital emergency room.  If you develope a fever above 101 F, pus (white drainage) or increased drainage or redness at the wound, or calf pain, call your surgeon's office.   Constipation Prevention    Complete by:  As directed    Drink plenty of fluids.  Prune juice may be helpful.  You may use a stool softener, such as Colace (over the counter) 100 mg twice a day.  Use MiraLax (over the counter) for constipation as needed.   Diet - low sodium heart healthy    Complete by:  As directed    Driving restrictions    Complete by:  As directed    No driving for 2 weeks   Follow the hip precautions as taught in Physical Therapy    Complete by:  As directed    Increase activity slowly as tolerated    Complete by:  As directed    Patient may shower    Complete by:  As directed    You may shower without a dressing once there is no drainage.  Do not wash over the wound.  If drainage remains, cover wound with plastic wrap and then shower.      Follow-up Information    Kerin Salen, MD Follow up in 2 week(s).    Specialty:  Orthopedic Surgery Contact information: [place] [address] [phone]            Signed: Hardin Negus Estephanie Hubbs R 03/24/2016, 7:29 AM

## 2016-03-26 ENCOUNTER — Non-Acute Institutional Stay (SKILLED_NURSING_FACILITY): Payer: Medicare Other | Admitting: Internal Medicine

## 2016-03-26 ENCOUNTER — Encounter: Payer: Self-pay | Admitting: Internal Medicine

## 2016-03-26 DIAGNOSIS — R300 Dysuria: Secondary | ICD-10-CM | POA: Diagnosis not present

## 2016-03-26 DIAGNOSIS — E876 Hypokalemia: Secondary | ICD-10-CM

## 2016-03-26 DIAGNOSIS — I10 Essential (primary) hypertension: Secondary | ICD-10-CM

## 2016-03-26 DIAGNOSIS — D62 Acute posthemorrhagic anemia: Secondary | ICD-10-CM | POA: Diagnosis not present

## 2016-03-26 DIAGNOSIS — K219 Gastro-esophageal reflux disease without esophagitis: Secondary | ICD-10-CM

## 2016-03-26 DIAGNOSIS — K5901 Slow transit constipation: Secondary | ICD-10-CM | POA: Diagnosis not present

## 2016-03-26 DIAGNOSIS — M1611 Unilateral primary osteoarthritis, right hip: Secondary | ICD-10-CM

## 2016-03-26 DIAGNOSIS — G4700 Insomnia, unspecified: Secondary | ICD-10-CM

## 2016-03-26 DIAGNOSIS — R2681 Unsteadiness on feet: Secondary | ICD-10-CM | POA: Diagnosis not present

## 2016-03-26 NOTE — Progress Notes (Signed)
LOCATION: [facility]  PCP: Ann Held, DO   Code Status: Full Code  Goals of care: Advanced Directive information Advanced Directives 03/13/2016  Does patient have an advance directive? No  Would patient like information on creating an advanced directive? No - patient declined information       Extended Emergency Contact Information Primary Emergency Contact: Nadeem,[name] Address: [address] [place] Mobile Phone: [phone] Relation: Spouse Secondary Emergency Contact: [name],[name]  [place] [employer] Phone: [phone] Relation: Sister   Allergies  Allergen Reactions  . Aspirin Other (See Comments)    Stomach ulcer, now inactive, OK with low dose ASA    Chief Complaint  Patient presents with  . New Admit To SNF    New Admission Visit     HPI:  Patient is a 70 y.o. female seen today for short term rehabilitation post hospital admission from 03/21/16-03/24/16 with right hip osteoarthritis. She underwent right total hip arthroplasty. She is seen in her room today.  Review of Systems:  Constitutional: Negative for fever, chills and diaphoresis. Energy level is slowly coming back. HENT: Negative for headache, congestion, nasal discharge. Eyes: Negative for double vision and discharge. Wears glasses. Respiratory: Negative for cough, shortness of breath and wheezing.   Cardiovascular: Negative for chest pain, palpitations, leg swelling.  Gastrointestinal: Negative for heartburn, nausea, vomiting, abdominal pain. Last bowel movement was Friday morning.  Genitourinary: Positive for dysuria, urgency Musculoskeletal: Negative for back pain, fall in the facility.  Skin: Negative for itching, rash.  Neurological: Positive for occasional dizziness. Psychiatric/Behavioral: Negative for depression    Past Medical History:  Diagnosis Date  . Anxiety    takes Valium daily as needed  . Chronic  back pain   . Chronic back pain   . Complication of anesthesia 1980s   "I stopped breathing on the table; I was gettin my wisdom teeth extracted"  . Constipation   . Depression    takes Lexapro daily  . Dysrhythmia    pt. reports that her heart skips sometimes   . GERD (gastroesophageal reflux disease)    takes Zantac daily  . History of blood transfusion    no abnormal reaction noted  . History of colon polyps    benign  . History of gastric ulcer   . History of GI bleed   . History of hiatal hernia   . History of stress test    done in [place]- done in  1992, told that it was normal.   . Hypertension   . Insomnia    takes Restoril and Trazodone nightly as needed  . Joint pain   . Joint swelling   . Nocturia   . Osteoarthritis   . Peripheral edema    occasionally but never been on fluid pill   Past Surgical History:  Procedure Laterality Date  . BACK SURGERY    . COLONOSCOPY    . ESOPHAGOGASTRODUODENOSCOPY N/A 06/16/2014   Procedure: ESOPHAGOGASTRODUODENOSCOPY (EGD);  Surgeon: Missy Sabins, MD;  Location: [facility] ENDOSCOPY;  Service: Endoscopy;  Laterality: N/A;  . JOINT REPLACEMENT    . LUMBAR FUSION    . SHOULDER SURGERY Left   . TOE SURGERY Right    "big toe was coming off"  . TOE SURGERY Left    "bone spur"  . TOTAL HIP ARTHROPLASTY Left 02/26/2015   Procedure: TOTAL HIP ARTHROPLASTY ANTERIOR APPROACH;  Surgeon: Pilar Plate  Mayer Camel, MD;  Location: [facility] [place];  Service: Orthopedics;  Laterality: Left;  . TOTAL HIP ARTHROPLASTY Right 03/21/2016   Procedure: TOTAL HIP ARTHROPLASTY ANTERIOR APPROACH;  Surgeon: Frederik Pear, MD;  Location: [place];  Service: Orthopedics;  Laterality: Right;  . WISDOM TOOTH EXTRACTION  1980s   Social History:   reports that she has never smoked. She has never used smokeless tobacco. She reports that she does not drink alcohol or use drugs.  Family History  Problem Relation Age of Onset  . Cancer Mother     colon cancer  . Cancer Father     colon cancer  .  Cancer Sister 73    died of colon cancer  . Heart disease Neg Hx     Medications:   Medication List       Accurate as of 03/26/16 11:21 AM. Always use your most recent med list.          amoxicillin 500 MG tablet Commonly known as:  AMOXIL Take 2,000 mg by mouth See admin instructions. Take 4 tablets (2000 mg) by mouth one before dental procedures - last procedure 03/06/16   aspirin EC 325 MG tablet Take 1 tablet (325 mg total) by mouth 2 (two) times daily.   cyclobenzaprine 5 MG tablet Commonly known as:  FLEXERIL Take 1 tablet (5 mg total) by mouth 3 (three) times daily.   diazepam 10 MG tablet Commonly known as:  VALIUM TAKE 1 TABLET BY MOUTH EVERY 12 HOURS AS NEEDED FOR ANXIETY   diclofenac sodium 1 % Gel Commonly known as:  VOLTAREN APPLY 2 G TOPICALLY 4 (FOUR) TIMES DAILY.   estrogen (conjugated)-medroxyprogesterone 0.3-1.5 MG tablet Commonly known as:  PREMPRO Take 1 tablet by mouth daily.   Eszopiclone 3 MG Tabs Take 3 mg by mouth at bedtime as needed (sleep). Lunesta   hydrochlorothiazide 25 MG tablet Commonly known as:  HYDRODIURIL Take 1 tablet (25 mg total) by mouth daily.   hydrocortisone cream 1 % Apply to affected area 2 times daily   oxyCODONE-acetaminophen 10-325 MG tablet Commonly known as:  PERCOCET Take 1 tablet by mouth every 4 (four) hours as needed for pain. Pt. Reports that she sometimes takes 2 tabs.   potassium chloride 10 MEQ tablet Commonly known as:  K-DUR Take 3 tablets (30 mEq total) by mouth 3 (three) times daily.   ranitidine 150 MG tablet Commonly known as:  ZANTAC Take 1 tablet (150 mg total) by mouth 2 (two) times daily.   temazepam 15 MG capsule Commonly known as:  RESTORIL 1-2 po qhs prn   traZODone 50 MG tablet Commonly known as:  DESYREL TAKE 1 TABLET BY MOUTH AT BEDTIME       Immunizations:  There is no immunization history on file for this patient.   Physical Exam: Vitals:   03/26/16 1111  BP: (!)  162/86  Pulse: 77  Resp: 19  Temp: 97.5 F (36.4 C)  TempSrc: Oral  SpO2: 99%  Weight: 143 lb (64.9 kg)  Height: 5\' 4"  (1.626 m)   Body mass index is 24.55 kg/m.  General- elderly female, well built, in no acute distress Head- normocephalic, atraumatic Nose- no maxillary or frontal sinus tenderness, no nasal discharge Throat- moist mucus membrane  Eyes- PERRLA, EOMI, no pallor, no icterus, no discharge, normal conjunctiva, normal sclera Neck- no cervical lymphadenopathy Cardiovascular- normal s1,s2, no murmur, trace leg edema Respiratory- bilateral clear to auscultation, no wheeze, no rhonchi, no crackles, no use of accessory muscles Abdomen-  bowel sounds present, soft, non tender, no guarding or rigidity, no CVA tenderness Musculoskeletal- able to move all 4 extremities, generalized weakness, limited right hip ROM Neurological- alert and oriented to person, place and time Skin- warm and dry, right hip surgical incision with aquacel dressing Psychiatry- normal mood and affect    Labs reviewed: Basic Metabolic Panel:  Recent Labs  11/23/15 1237 03/13/16 1019 03/20/16 1526 03/21/16 0629  NA 140 139 142 141  K 3.4* 2.8* 2.9* 2.8*  CL 106 92* 102  --   CO2 28 35* 32  --   GLUCOSE 92 106* 104* 103*  BUN 7 10 6   --   CREATININE 1.08 1.55* 0.90  --   CALCIUM 9.4 8.8* 9.1  --    Liver Function Tests:  Recent Labs  09/04/15 1502  AST 12  ALT 6  ALKPHOS 64  BILITOT 0.4  PROT 7.0  ALBUMIN 3.7   No results for input(s): LIPASE, AMYLASE in the last 8760 hours. No results for input(s): AMMONIA in the last 8760 hours. CBC:  Recent Labs  10/10/15 2314 01/15/16 1033 03/13/16 1019  03/22/16 0349 03/23/16 0337 03/24/16 0411  WBC 7.8 7.7 10.2  --  8.0 12.2* 10.2  NEUTROABS 4.6 4.9 6.4  --   --   --   --   HGB 11.9* 12.3 11.5*  < > 8.7* 8.2* 10.8*  HCT 35.3* 37.0 36.4  < > 27.5* 24.8* 34.2*  MCV 95.9 93.1 95.0  --  96.2 95.4 92.7  PLT 497* 403.0* 547*  --  305  257 238  < > = values in this interval not displayed. Cardiac Enzymes: No results for input(s): CKTOTAL, CKMB, CKMBINDEX, TROPONINI in the last 8760 hours. BNP: Invalid input(s): POCBNP CBG: No results for input(s): GLUCAP in the last 8760 hours.  Radiological Exams: Dg C-arm 1-60 Min  Result Date: 03/21/2016 CLINICAL DATA:  Anterior approach right hip arthroplasty EXAM: OPERATIVE RIGHT HIP (WITH PELVIS IF PERFORMED) 2 VIEWS TECHNIQUE: Fluoroscopic spot image(s) were submitted for interpretation post-operatively. COMPARISON:  09/27/2015 right hip radiographs FINDINGS: Fluoroscopy time 0 minutes 19 seconds. Two spot fluoroscopic nondiagnostic intraoperative right hip radiographs demonstrate postsurgical changes from right total hip arthroplasty, with well situated right hip hardware on these limited frontal views. IMPRESSION: Intraoperative fluoroscopic guidance for right total hip arthroplasty. Electronically Signed   By: Ilona Sorrel M.D.   On: 03/21/2016 09:15   Dg Hip Operative Unilat W Or W/o Pelvis Right  Result Date: 03/21/2016 CLINICAL DATA:  Anterior approach right hip arthroplasty EXAM: OPERATIVE RIGHT HIP (WITH PELVIS IF PERFORMED) 2 VIEWS TECHNIQUE: Fluoroscopic spot image(s) were submitted for interpretation post-operatively. COMPARISON:  09/27/2015 right hip radiographs FINDINGS: Fluoroscopy time 0 minutes 19 seconds. Two spot fluoroscopic nondiagnostic intraoperative right hip radiographs demonstrate postsurgical changes from right total hip arthroplasty, with well situated right hip hardware on these limited frontal views. IMPRESSION: Intraoperative fluoroscopic guidance for right total hip arthroplasty. Electronically Signed   By: Ilona Sorrel M.D.   On: 03/21/2016 09:15    Assessment/Plan   Unsteady gait With right hip surgery. Will have patient work with PT/OT as tolerated to regain strength and restore function.  Fall precautions are in place.  Right hip OA S/p right  total hip arthroplasty. Has orthopedic follow up. Will have her work with physical therapy and occupational therapy team to help with gait training and muscle strengthening exercises.fall precautions. Skin care. Encourage to be out of bed. Continue percocet 10-325 mg q4h prn pain.  Continue flexeril 5 mg tid for muscle spasm. Continue aspirin ec 325 mg bid for dvt prophylaxis  Blood loss anemia S/p surgery, monitor cbc  Hypokalemia Check bmp. Continue kcl 30 meq tid  Dysuria Send u/a with c/s. Monitor clinically. Hydration encouraged  Constipation Slow transit. Add senna s 2 tab qhs and miralax daily, hydration encouraged  Insomnia Continue trazodone daily and lunesta on need basis  HTN Monitor bp and bmp. Continue hctz 25 mg qd  gerd Stable on zantac, no changes made   Goals of care: short term rehabilitation   Labs/tests ordered: cbc, cmp  Family/ staff Communication: reviewed care plan with patient and nursing supervisor    Blanchie Serve, MD Internal Medicine [place], [place] [address] Cell Phone (Monday-Friday 8 am - 5 pm): [phone] On Call: [phone] and follow prompts after 5 pm and on weekends Office Phone: [phone] Office Fax: [phone]

## 2016-03-28 ENCOUNTER — Non-Acute Institutional Stay (SKILLED_NURSING_FACILITY): Payer: Medicare Other | Admitting: Family

## 2016-03-28 ENCOUNTER — Encounter: Payer: Self-pay | Admitting: Family

## 2016-03-28 DIAGNOSIS — E876 Hypokalemia: Secondary | ICD-10-CM

## 2016-03-28 DIAGNOSIS — I10 Essential (primary) hypertension: Secondary | ICD-10-CM | POA: Diagnosis not present

## 2016-03-28 DIAGNOSIS — G4700 Insomnia, unspecified: Secondary | ICD-10-CM | POA: Diagnosis not present

## 2016-03-28 DIAGNOSIS — Z96641 Presence of right artificial hip joint: Secondary | ICD-10-CM | POA: Diagnosis not present

## 2016-03-28 DIAGNOSIS — K5909 Other constipation: Secondary | ICD-10-CM

## 2016-03-28 DIAGNOSIS — K219 Gastro-esophageal reflux disease without esophagitis: Secondary | ICD-10-CM | POA: Diagnosis not present

## 2016-03-28 DIAGNOSIS — R2681 Unsteadiness on feet: Secondary | ICD-10-CM | POA: Diagnosis not present

## 2016-03-28 NOTE — Progress Notes (Signed)
Patient ID: Brandi [name], female   DOB: 08/10/1945, 70 y.o.   MRN: [id number]  Location:  [facility] Room Number: [id number] P Place of Service:  SNF [phone])  Provider: Marlowe Sax FNP-C   PCP: Ann Held, DO Patient Care Team: Ann Held, DO as PCP - General [facility])  Extended Emergency Contact Information Primary Emergency Contact: Lamoreaux,[name] Address: [place] Mobile Phone: [phone] Relation: Spouse Secondary Emergency Contact: [place] Mobile Phone: [phone] Relation: Sister  Code Status: Full Code  Goals of care:  Advanced Directive information Advanced Directives 03/28/2016  Does patient have an advance directive? No  Would patient like information on creating an advanced directive? No - patient declined information     Allergies  Allergen Reactions  . Aspirin Other (See Comments)    Stomach ulcer, now inactive, OK with low dose ASA    Chief Complaint  Patient presents with  . Discharge Note    HPI:  70 y.o. female  Seen at  [facility] for discharge home.She was here for short term Rehabilitation post hospital admission from 03/21/16-03/24/16 with right hip osteoarthritis. She underwent right total hip arthroplasty.She has a medical history of depression, anxiety,HTN, Osteoarthritis, constipation among other condition. She complains of constipation for three days. She states right hip pain under control with current pain regimen. She has worked well with PT/OT now stable for discharge home. She will be discharged home with Home with outpatient PT/OT to continue with ROM, Exercise, Gait stability and muscle strengthening. She does not require any DME states has own FWW. Outpatient health services will be arranged by facility social worker prior to discharge. Prescription medication will be written x 1  month then patient to follow up with PCP in 1-2 weeks.Facility staff report no new concerns.       Past Medical History:  Diagnosis Date  . Anxiety    takes Valium daily as needed  . Chronic back pain   . Chronic back pain   . Complication of anesthesia 1980s   "I stopped breathing on the table; I was gettin my wisdom teeth extracted"  . Constipation   . Depression    takes Lexapro daily  . Dysrhythmia    pt. reports that her heart skips sometimes   . GERD (gastroesophageal reflux disease)    takes Zantac daily  . History of blood transfusion    no abnormal reaction noted  . History of colon polyps    benign  . History of gastric ulcer   . History of GI bleed   . History of hiatal hernia   . History of stress test    done in [place]- done in  1992, told that it was normal.   . Hypertension   . Insomnia    takes Restoril and Trazodone nightly as needed  . Joint pain   . Joint swelling   . Nocturia   . Osteoarthritis   . Peripheral edema    occasionally but never been on fluid pill    Past Surgical History:  Procedure Laterality Date  . BACK SURGERY    . COLONOSCOPY    . ESOPHAGOGASTRODUODENOSCOPY N/A 06/16/2014   Procedure: ESOPHAGOGASTRODUODENOSCOPY (EGD);  Surgeon: Missy Sabins, MD;  Location: [facility] ENDOSCOPY;  Service: Endoscopy;  Laterality: N/A;  . JOINT REPLACEMENT    .  LUMBAR FUSION    . SHOULDER SURGERY Left   . TOE SURGERY Right    "big toe was coming off"  . TOE SURGERY Left    "bone spur"  . TOTAL HIP ARTHROPLASTY Left 02/26/2015   Procedure: TOTAL HIP ARTHROPLASTY ANTERIOR APPROACH;  Surgeon: Frederik Pear, MD;  Location: [place];  Service: Orthopedics;  Laterality: Left;  . TOTAL HIP ARTHROPLASTY Right 03/21/2016   Procedure: TOTAL HIP ARTHROPLASTY ANTERIOR APPROACH;  Surgeon: Frederik Pear, MD;  Location: [place];  Service: Orthopedics;  Laterality: Right;  . WISDOM TOOTH EXTRACTION  1980s      reports that she has never smoked. She has never used smokeless  tobacco. She reports that she does not drink alcohol or use drugs. Social History   Social History  . Marital status: Married    Spouse name: N/A  . Number of children: N/A  . Years of education: N/A   Occupational History  . retired [employer]    Social History Main Topics  . Smoking status: Never Smoker  . Smokeless tobacco: Never Used  . Alcohol use No  . Drug use: No  . Sexual activity: Yes    Birth control/ protection: Post-menopausal   Other Topics Concern  . Not on file   Social History Narrative   Retired from [employer] in [place], exercise - walking   retired Environmental manager, Musician, librarian      Allergies  Allergen Reactions  . Aspirin Other (See Comments)    Stomach ulcer, now inactive, OK with low dose ASA    Pertinent  Health Maintenance Due  Topic Date Due  . DEXA SCAN  10/21/2012  . MAMMOGRAM  11/04/2012  . INFLUENZA VACCINE  09/03/2016 (Originally 01/15/2016)  . PNA vac Low Risk Adult (1 of 2 - PCV13) 09/03/2016 (Originally 10/21/2012)  . COLONOSCOPY  11/04/2021    Medications:   Medication List       Accurate as of 03/28/16  9:59 AM. Always use your most recent med list.          amoxicillin 500 MG tablet Commonly known as:  AMOXIL Take 2,000 mg by mouth See admin instructions. Take 4 tablets (2000 mg) by mouth one before dental procedures - last procedure 03/06/16   aspirin EC 325 MG tablet Take 1 tablet (325 mg total) by mouth 2 (two) times daily.   cyclobenzaprine 5 MG tablet Commonly known as:  FLEXERIL Take 1 tablet (5 mg total) by mouth 3 (three) times daily.   diazepam 10 MG tablet Commonly known as:  VALIUM TAKE 1 TABLET BY MOUTH EVERY 12 HOURS AS NEEDED FOR ANXIETY   diclofenac sodium 1 % Gel Commonly known as:  VOLTAREN APPLY 2 G TOPICALLY 4 (FOUR) TIMES DAILY.   estrogen (conjugated)-medroxyprogesterone 0.3-1.5 MG tablet Commonly known as:  PREMPRO Take 1 tablet by mouth daily.   Eszopiclone 3 MG  Tabs Take 3 mg by mouth at bedtime as needed (sleep). Lunesta   hydrochlorothiazide 25 MG tablet Commonly known as:  HYDRODIURIL Take 1 tablet (25 mg total) by mouth daily.   hydrocortisone cream 1 % Apply to affected area 2 times daily   oxyCODONE-acetaminophen 10-325 MG tablet Commonly known as:  PERCOCET Take 1 tablet by mouth every 4 (four) hours as needed for pain. Pt. Reports that she sometimes takes 2 tabs.   polyethylene glycol packet Commonly known as:  MIRALAX / GLYCOLAX Take 17 g by mouth daily.   potassium chloride 10 MEQ  tablet Commonly known as:  K-DUR Take 3 tablets (30 mEq total) by mouth 3 (three) times daily.   ranitidine 150 MG tablet Commonly known as:  ZANTAC Take 1 tablet (150 mg total) by mouth 2 (two) times daily.   senna 8.6 MG tablet Commonly known as:  SENOKOT Take 2 tablets by mouth daily.   temazepam 15 MG capsule Commonly known as:  RESTORIL 1-2 po qhs prn   traZODone 50 MG tablet Commonly known as:  DESYREL TAKE 1 TABLET BY MOUTH AT BEDTIME       Review of Systems  Constitutional: Negative for activity change, appetite change, chills and fever.  HENT: Negative for congestion, rhinorrhea, sinus pressure, sneezing and sore throat.   Eyes: Negative.   Respiratory: Negative for cough, chest tightness, shortness of breath and wheezing.   Cardiovascular: Negative for chest pain, palpitations and leg swelling.  Gastrointestinal: Positive for constipation. Negative for abdominal distention, abdominal pain, diarrhea, nausea and vomiting.  Endocrine: Negative.   Genitourinary: Negative for dysuria, frequency and urgency.  Musculoskeletal: Positive for gait problem.       Hip pain   Skin: Negative for color change, pallor and rash.       Right hip incision   Neurological: Negative for dizziness, seizures, syncope, light-headedness and headaches.  Psychiatric/Behavioral: Negative for agitation, confusion, hallucinations and sleep disturbance.  The patient is not nervous/anxious.     Vitals:   03/28/16 0940  BP: 133/80  Resp: 18  Temp: 98.1 F (36.7 C)  TempSrc: Oral  SpO2: 97%  Weight: 143 lb (64.9 kg)  Height: 5\' 4"  (1.626 m)   Body mass index is 24.55 kg/m. Physical Exam  Constitutional: She is oriented to person, place, and time. She appears well-developed and well-nourished. No distress.  HENT:  Head: Normocephalic.  Mouth/Throat: Oropharynx is clear and moist. No oropharyngeal exudate.  Eyes: Conjunctivae and EOM are normal. Pupils are equal, round, and reactive to light. Left eye exhibits no discharge. No scleral icterus.  Neck: Normal range of motion. No thyromegaly present.  Cardiovascular: Normal rate, regular rhythm and intact distal pulses.  Exam reveals no gallop and no friction rub.   No murmur heard. Pulmonary/Chest: Effort normal and breath sounds normal. No respiratory distress. She has no wheezes. She has no rales.  Abdominal: Soft. Bowel sounds are normal. She exhibits no distension. There is no tenderness. There is no rebound and no guarding.  Genitourinary:  Genitourinary Comments: Continent   Musculoskeletal: She exhibits no edema, tenderness or deformity.  Moves x 4 extremities. Right hip limited ROS due to pain.   Lymphadenopathy:    She has no cervical adenopathy.  Neurological: She is oriented to person, place, and time.  Skin: Skin is warm and dry. No rash noted. No erythema. No pallor.  Right hip surgical incision drsg dry and intact.   Psychiatric: She has a normal mood and affect.    Labs reviewed: Basic Metabolic Panel:  Recent Labs  11/23/15 1237 03/13/16 1019 03/20/16 1526 03/21/16 0629  NA 140 139 142 141  K 3.4* 2.8* 2.9* 2.8*  CL 106 92* 102  --   CO2 28 35* 32  --   GLUCOSE 92 106* 104* 103*  BUN 7 10 6   --   CREATININE 1.08 1.55* 0.90  --   CALCIUM 9.4 8.8* 9.1  --    Liver Function Tests:  Recent Labs  09/04/15 1502  AST 12  ALT 6  ALKPHOS 64  BILITOT  0.4  PROT 7.0  ALBUMIN 3.7   CBC:  Recent Labs  10/10/15 2314 01/15/16 1033 03/13/16 1019  03/22/16 0349 03/23/16 0337 03/24/16 0411  WBC 7.8 7.7 10.2  --  8.0 12.2* 10.2  NEUTROABS 4.6 4.9 6.4  --   --   --   --   HGB 11.9* 12.3 11.5*  < > 8.7* 8.2* 10.8*  HCT 35.3* 37.0 36.4  < > 27.5* 24.8* 34.2*  MCV 95.9 93.1 95.0  --  96.2 95.4 92.7  PLT 497* 403.0* 547*  --  305 257 238  < > = values in this interval not displayed.  Assessment/Plan:   HTN B/p stable. Continue Hydrochlorothiazide. BMP in 1-2 weeks with PCP.   GERD Stable. Continue Zantac.   Constipation  No BM x 3 days. Start Bisacodyl 10 mg Tablet x 1 dose. May repeat x 1 if no result. Continue Mira lax. Continue to monitor. Encourage Fluid intact and oral intake.    Insomnia  Continue Restoril. Hypokalemia  Continue supplements. BMP in 1-2 weeks with PCP   Unsteady gait S/p short term Rehab post right hip arthroplasty.continue with outpatient PT/OT for ROM, Exercise, Gait stability and muscle strengthening. No DME states has own FWW.   S/p Right Hip Arthroplasty  Continue current pain regimen.follow up with Ortho as directed  Patient is being discharged with the following home health services:   ROM, Exercise, Gait stability and muscle strengthening.   Patient is being discharged with the following durable medical equipment:    No DME required has own FWW.   Patient has been advised to f/u with their PCP in 1-2 weeks to for a transitions of care visit.  Social services at their facility was responsible for arranging this appointment.  Pt was provided with adequate prescriptions of noncontrolled medications to reach the scheduled appointment .  For controlled substances, a limited supply was provided as appropriate for the individual patient.  If the pt normally receives these medications from a pain clinic or has a contract with another physician, these medications should be received from that clinic or  physician only).    Future labs/tests needed:  BMP, CBC in 1-2 weeks with PCP

## 2016-04-01 ENCOUNTER — Telehealth: Payer: Self-pay

## 2016-04-01 MED ORDER — SULFAMETHOXAZOLE-TRIMETHOPRIM 800-160 MG PO TABS
1.0000 | ORAL_TABLET | Freq: Two times a day (BID) | ORAL | 0 refills | Status: AC
Start: 2016-04-01 — End: 2016-04-04

## 2016-04-01 NOTE — Telephone Encounter (Signed)
My nurse spoke with the patient, she is asymptomatic. Please call in Bactrim DS 1 by mouth twice a day #6, no refills

## 2016-04-01 NOTE — Telephone Encounter (Addendum)
Received fax from [facility] with the patient's UA/Microbiology results dated 03/31/2016 and the patient has 100,000 colonies of E.Coli.  I spoke with the patient and she stated she was unaware of this UTI and has not been treated. She is no longer under the care of the [facility].  Please advise in Dr.Lowne's absence.     KP

## 2016-04-01 NOTE — Telephone Encounter (Signed)
Patient has been made aware and verbalized understanding, Rx has been faxed to the pharmacy.   KP

## 2016-04-03 DIAGNOSIS — Z471 Aftercare following joint replacement surgery: Secondary | ICD-10-CM | POA: Diagnosis not present

## 2016-04-03 DIAGNOSIS — Z96641 Presence of right artificial hip joint: Secondary | ICD-10-CM | POA: Diagnosis not present

## 2016-04-03 DIAGNOSIS — M1611 Unilateral primary osteoarthritis, right hip: Secondary | ICD-10-CM | POA: Diagnosis not present

## 2016-04-08 DIAGNOSIS — N3281 Overactive bladder: Secondary | ICD-10-CM | POA: Diagnosis not present

## 2016-04-14 ENCOUNTER — Ambulatory Visit: Payer: Medicare Other | Admitting: Family Medicine

## 2016-04-14 DIAGNOSIS — Z0289 Encounter for other administrative examinations: Secondary | ICD-10-CM

## 2016-04-15 ENCOUNTER — Telehealth: Payer: Self-pay | Admitting: Family Medicine

## 2016-04-15 ENCOUNTER — Encounter: Payer: Self-pay | Admitting: Family Medicine

## 2016-04-15 NOTE — Telephone Encounter (Signed)
Patient came in at 4:30 yesterday for an 11:30 appointment that had been confirmed with patient the day before. Charge or No Charge?

## 2016-04-15 NOTE — Telephone Encounter (Signed)
charge 

## 2016-04-18 ENCOUNTER — Ambulatory Visit (INDEPENDENT_AMBULATORY_CARE_PROVIDER_SITE_OTHER): Payer: Medicare Other | Admitting: Family Medicine

## 2016-04-18 ENCOUNTER — Encounter: Payer: Self-pay | Admitting: Family Medicine

## 2016-04-18 VITALS — BP 100/60 | HR 90 | Temp 99.1°F | Wt 139.2 lb

## 2016-04-18 DIAGNOSIS — M1611 Unilateral primary osteoarthritis, right hip: Secondary | ICD-10-CM

## 2016-04-18 DIAGNOSIS — M62838 Other muscle spasm: Secondary | ICD-10-CM

## 2016-04-18 DIAGNOSIS — R8299 Other abnormal findings in urine: Secondary | ICD-10-CM | POA: Diagnosis not present

## 2016-04-18 DIAGNOSIS — F411 Generalized anxiety disorder: Secondary | ICD-10-CM | POA: Diagnosis not present

## 2016-04-18 DIAGNOSIS — E876 Hypokalemia: Secondary | ICD-10-CM

## 2016-04-18 DIAGNOSIS — R8290 Unspecified abnormal findings in urine: Secondary | ICD-10-CM

## 2016-04-18 DIAGNOSIS — R82998 Other abnormal findings in urine: Secondary | ICD-10-CM

## 2016-04-18 LAB — CBC WITH DIFFERENTIAL/PLATELET
BASOS ABS: 0 {cells}/uL (ref 0–200)
Basophils Relative: 0 %
EOS ABS: 366 {cells}/uL (ref 15–500)
EOS PCT: 6 %
HEMATOCRIT: 33.6 % — AB (ref 35.0–45.0)
HEMOGLOBIN: 10.7 g/dL — AB (ref 11.7–15.5)
LYMPHS ABS: 1586 {cells}/uL (ref 850–3900)
Lymphocytes Relative: 26 %
MCH: 29.3 pg (ref 27.0–33.0)
MCHC: 31.8 g/dL — ABNORMAL LOW (ref 32.0–36.0)
MCV: 92.1 fL (ref 80.0–100.0)
MONO ABS: 915 {cells}/uL (ref 200–950)
MPV: 9.3 fL (ref 7.5–12.5)
Monocytes Relative: 15 %
NEUTROS ABS: 3233 {cells}/uL (ref 1500–7800)
Neutrophils Relative %: 53 %
Platelets: 308 10*3/uL (ref 140–400)
RBC: 3.65 MIL/uL — ABNORMAL LOW (ref 3.80–5.10)
RDW: 15.3 % — ABNORMAL HIGH (ref 11.0–15.0)
WBC: 6.1 10*3/uL (ref 3.8–10.8)

## 2016-04-18 LAB — POCT URINALYSIS DIPSTICK
BILIRUBIN UA: NEGATIVE
Blood, UA: NEGATIVE
GLUCOSE UA: NEGATIVE
KETONES UA: NEGATIVE
Nitrite, UA: NEGATIVE
Protein, UA: NEGATIVE
SPEC GRAV UA: 1.015
Urobilinogen, UA: 1
pH, UA: 7

## 2016-04-18 MED ORDER — CYCLOBENZAPRINE HCL 5 MG PO TABS: 5.0000 mg | ORAL_TABLET | Freq: Three times a day (TID) | ORAL | 0 refills | Status: DC

## 2016-04-18 MED ORDER — DIAZEPAM 10 MG PO TABS: ORAL_TABLET | ORAL | 1 refills | Status: DC

## 2016-04-18 MED FILL — OXYCODONE-APAP 10-325: 10-325 | 30 days supply | Qty: 60 | Fill #0

## 2016-04-18 MED FILL — CYCLOBENZAPRINE 5 MG TABLET: 5 | 20 days supply | Qty: 60 | Fill #0

## 2016-04-18 NOTE — Progress Notes (Signed)
Pre visit review using our clinic review tool, if applicable. No additional management support is needed unless otherwise documented below in the visit note. 

## 2016-04-18 NOTE — Assessment & Plan Note (Signed)
S/p r hip replacement PT starts next week

## 2016-04-18 NOTE — Progress Notes (Signed)
Patient ID: Brandi [name], female    DOB: January 13, 1946  Age: 70 y.o. MRN: [id number]    Subjective:  Subjective  HPI Brandi [name] presents for f/u hosp for R hip replacement.  Pt is doing well and PT starts next week.  Pt doing well.    Review of Systems  Constitutional: Negative for appetite change, diaphoresis, fatigue and unexpected weight change.  Eyes: Negative for pain, redness and visual disturbance.  Respiratory: Negative for cough, chest tightness, shortness of breath and wheezing.   Cardiovascular: Negative for chest pain, palpitations and leg swelling.  Endocrine: Negative for cold intolerance, heat intolerance, polydipsia, polyphagia and polyuria.  Genitourinary: Negative for difficulty urinating, dysuria and frequency.  Musculoskeletal: Positive for gait problem. Negative for joint swelling.  Neurological: Negative for dizziness, light-headedness, numbness and headaches.    History Past Medical History:  Diagnosis Date  . Anxiety    takes Valium daily as needed  . Chronic back pain   . Chronic back pain   . Complication of anesthesia 1980s   "I stopped breathing on the table; I was gettin my wisdom teeth extracted"  . Constipation   . Depression    takes Lexapro daily  . Dysrhythmia    pt. reports that her heart skips sometimes   . GERD (gastroesophageal reflux disease)    takes Zantac daily  . History of blood transfusion    no abnormal reaction noted  . History of colon polyps    benign  . History of gastric ulcer   . History of GI bleed   . History of hiatal hernia   . History of stress test    done in [place]- done in  1992, told that it was normal.   . Hypertension   . Insomnia    takes Restoril and Trazodone nightly as needed  . Joint pain   . Joint swelling   . Nocturia   . Osteoarthritis   . 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   Her family history includes Cancer in her father and mother; Cancer (age of onset: 36) in her sister.She reports that she has never smoked. She has never used smokeless tobacco. She reports that she does not drink alcohol or use drugs.  Current Outpatient Prescriptions on File Prior to Visit  Medication Sig Dispense Refill  . amoxicillin (AMOXIL) 500 MG tablet Take 2,000 mg by mouth See admin instructions. Take 4 tablets (2000 mg) by mouth one before dental procedures - last procedure 03/06/16  0  . aspirin EC 325 MG tablet Take 1 tablet (325 mg total) by mouth 2 (two) times daily. 30 tablet 0  . diclofenac sodium (VOLTAREN) 1 % GEL APPLY 2 G TOPICALLY 4 (FOUR) TIMES DAILY. 100 g 3  . estrogen, conjugated,-medroxyprogesterone (PREMPRO) 0.3-1.5 MG tablet Take 1 tablet by mouth daily. 28 tablet 3  . Eszopiclone 3 MG TABS Take 3 mg by mouth at bedtime as needed (sleep). Lunesta  3  . hydrochlorothiazide (HYDRODIURIL) 25 MG tablet Take 1 tablet (25 mg total) by mouth daily. 30 tablet 11  . hydrocortisone cream 1 % Apply to affected area 2 times daily 15 g 0  . oxyCODONE-acetaminophen (PERCOCET) 10-325  MG tablet Take 1 tablet by mouth every 4 (four) hours as needed for pain. Pt. Reports that she sometimes takes 2 tabs. 60 tablet 0  . polyethylene glycol (MIRALAX / GLYCOLAX) packet Take 17 g by mouth daily.    . potassium chloride (K-DUR) 10 MEQ tablet Take 3 tablets (30 mEq total) by mouth 3 (three) times daily. 60 tablet 0  . ranitidine (ZANTAC) 150 MG tablet Take 1 tablet (150 mg total) by mouth 2 (two) times daily. 180 tablet 3  . senna (SENOKOT) 8.6 MG tablet Take 2 tablets by mouth daily.    . temazepam (RESTORIL) 15 MG capsule 1-2 po qhs prn 60 capsule  0  . traZODone (DESYREL) 50 MG tablet TAKE 1 TABLET BY MOUTH AT BEDTIME 30 tablet 0   No current facility-administered medications on file prior to visit.      Objective:  Objective  Physical Exam  Constitutional: She is oriented to person, place, and time. She appears well-developed and well-nourished.  HENT:  Head: Normocephalic and atraumatic.  Eyes: Conjunctivae and EOM are normal.  Neck: Normal range of motion. Neck supple. No JVD present. Carotid bruit is not present. No thyromegaly present.  Cardiovascular: Normal rate, regular rhythm and normal heart sounds.   No murmur heard. Pulmonary/Chest: Effort normal and breath sounds normal. No respiratory distress. She has no wheezes. She has no rales. She exhibits no tenderness.  Musculoskeletal: She exhibits no edema.  Neurological: She is alert and oriented to person, place, and time.  Psychiatric: She has a normal mood and affect.  Nursing note and vitals reviewed.  BP 100/60 (BP Location: Right Arm, Patient Position: Sitting, Cuff Size: Normal)   Pulse 90   Temp 99.1 F (37.3 C)   Wt 139 lb 3.2 oz (63.1 kg)   SpO2 96%   BMI 23.89 kg/m  Wt Readings from Last 3 Encounters:  04/18/16 139 lb 3.2 oz (63.1 kg)  03/28/16 143 lb (64.9 kg)  03/26/16 143 lb (64.9 kg)     Lab Results  Component Value Date   WBC 10.2 03/24/2016   HGB 10.8 (L) 03/24/2016   HCT 34.2 (L) 03/24/2016   PLT 238 03/24/2016   GLUCOSE 103 (H) 03/21/2016   ALT 6 09/04/2015   AST 12 09/04/2015   NA 141 03/21/2016   K 2.8 (L) 03/21/2016   CL 102 03/20/2016   CREATININE 0.90 03/20/2016   BUN 6 03/20/2016   CO2 32 03/20/2016   TSH 0.58 09/04/2015   INR 0.95 03/13/2016    No results found.   Assessment & Plan:  Plan  I am having Brandi [name] maintain her diclofenac sodium, estrogen (conjugated)-medroxyprogesterone, Eszopiclone, ranitidine, hydrocortisone cream, hydrochlorothiazide, temazepam, amoxicillin, traZODone, potassium chloride,  oxyCODONE-acetaminophen, aspirin EC, senna, polyethylene glycol, cyclobenzaprine, and diazepam.  Meds ordered this encounter  Medications  . cyclobenzaprine (FLEXERIL) 5 MG tablet    Sig: Take 1 tablet (5 mg total) by mouth 3 (three) times daily.    Dispense:  60 tablet    Refill:  0  . diazepam (VALIUM) 10 MG tablet    Sig: TAKE 1 TABLET BY MOUTH EVERY 12 HOURS AS NEEDED FOR ANXIETY    Dispense:  30 tablet    Refill:  1    Problem List Items Addressed This Visit      Unprioritized   Hypokalemia - Primary   Relevant Orders   POCT urinalysis dipstick (Completed)   CBC with Differential/Platelet   Comprehensive metabolic panel   Primary osteoarthritis  of right hip (Chronic)    S/p r hip replacement PT starts next week      Relevant Medications   cyclobenzaprine (FLEXERIL) 5 MG tablet    Other Visit Diagnoses    Generalized anxiety disorder       Relevant Medications   diazepam (VALIUM) 10 MG tablet   Other Relevant Orders   POCT urinalysis dipstick (Completed)   Muscle spasm       Relevant Medications   cyclobenzaprine (FLEXERIL) 5 MG tablet   Other Relevant Orders   CBC with Differential/Platelet   Comprehensive metabolic panel   Abnormal urine       Relevant Orders   Urine Culture   Urine leukocytes       Relevant Orders   Urine Culture      Follow-up: Return in about 6 months (around 10/16/2016).  Ann Held, DO

## 2016-04-18 NOTE — Patient Instructions (Signed)
Total Hip Replacement Total hip replacement is a surgical procedure to remove damaged bone in your hip joint and replace it with an artificial hip joint (prosthetic hip joint). The purpose of this surgery is to reduce pain and to improve your hip function.  During a total hip replacement, one or both parts of the hip joint are replaced, depending on the type of joint damage you have. The hip is a ball-and-socket type of joint, and it has two main parts. The ball part of the joint (femoral head) is the top of the thigh bone (femur). The socket part of the joint is a large indent in the side of your pelvis (acetabulum) where the femur and pelvis meet. LET YOUR HEALTH CARE PROVIDER KNOW ABOUT:  Any allergies you have.  All medicines you are taking, including vitamins, herbs, eye drops, creams, and over-the-counter medicines.  Previous problems you or members of your family have had with the use of anesthetics.  Any blood disorders you have.  Previous surgeries you have had.  Medical conditions you have. RISKS AND COMPLICATIONS  Generally, total hip replacement is a safe procedure. However, problems can occur, including:  Infection.  Dislocation (the ball of the hip-joint prosthesis comes out of contact with the socket).  Loosening of the piece (stem) that connects the prosthetic femoral head to the femur.  Fracture of the bone while inserting the prosthesis.  Formation of blood clots, which can break loose and travel to and injure your lungs (pulmonary embolus). BEFORE THE PROCEDURE   Plan to have someone take you home after the procedure.  Do not eat or drink anything after midnight on the night before the procedure or as directed by your health care provider.  Ask your health care provider about:  Changing or stopping your regular medicines. This is especially important if you are taking diabetes medicines or blood thinners.  Taking medicines such as aspirin and ibuprofen. These  medicines can thin your blood. Do not take these medicines before your procedure if your health care provider asks you not to.  Ask your health care provider about how your surgical site will be marked or identified.  You may be given antibiotic medicines to help prevent infection. PROCEDURE   To reduce your risk of infection:  Your health care team will wash or sanitize their hands.  Your skin will be washed with soap.  An IV tube will be inserted into one of your veins. You will be given one or more of the following:  A medicine that makes you drowsy (sedative).  A medicine that makes you fall asleep (general anesthetic).  A medicine injected into your spine that numbs your body below the waist (spinal anesthetic).  An incision will be made in your hip. Your surgeon will take out any damaged cartilage and bone.  Your surgeon will then:  Insert a prosthetic socket into the acetabulum of your pelvis. This is usually secured with screws.  Remove the femoral head and replace it with a prosthetic ball and stem secured into the top of your femur.  Place the ball into the socket and check the range of motion and stability of your new hip.  Close the incision and apply a bandage over the surgical site. AFTER THE PROCEDURE   You will stay in a recovery area until the medicines have worn off.  Your vital signs, such as your pulse and blood pressure, will be monitored.  Once you are awake and stable, you will   be taken to a hospital room.  You may be directed to take actions to help prevent blood clots. These may include:  Walking soon after surgery, with someone assisting you. Moving around after surgery helps to improve blood flow.  Taking medicines to thin your blood (anticoagulants).  Wearing compression stockings or using different types of devices.  You will receive physical therapy until you are doing well and your health care provider feels it is safe for you to go  home.   This information is not intended to replace advice given to you by your health care provider. Make sure you discuss any questions you have with your health care provider.   Document Released: 09/08/2000 Document Revised: 02/21/2015 Document Reviewed: 08/03/2013 Elsevier Interactive Patient Education 2016 Elsevier Inc.  

## 2016-04-19 LAB — COMPREHENSIVE METABOLIC PANEL
ALBUMIN: 3.4 g/dL — AB (ref 3.6–5.1)
ALT: 7 U/L (ref 6–29)
AST: 9 U/L — ABNORMAL LOW (ref 10–35)
Alkaline Phosphatase: 94 U/L (ref 33–130)
BILIRUBIN TOTAL: 0.5 mg/dL (ref 0.2–1.2)
BUN: 6 mg/dL — ABNORMAL LOW (ref 7–25)
CALCIUM: 8.8 mg/dL (ref 8.6–10.4)
CHLORIDE: 98 mmol/L (ref 98–110)
CO2: 28 mmol/L (ref 20–31)
CREATININE: 1.01 mg/dL — AB (ref 0.50–0.99)
Glucose, Bld: 76 mg/dL (ref 65–99)
Potassium: 3.1 mmol/L — ABNORMAL LOW (ref 3.5–5.3)
SODIUM: 137 mmol/L (ref 135–146)
TOTAL PROTEIN: 6.3 g/dL (ref 6.1–8.1)

## 2016-04-20 LAB — URINE CULTURE

## 2016-04-23 ENCOUNTER — Telehealth: Payer: Self-pay | Admitting: Family Medicine

## 2016-04-23 NOTE — Telephone Encounter (Signed)
Caller name: Relationship to patient: Self Can be reached: [phone]  Pharmacy:  Reason for call: Patient request call back to discuss the number of pills she has to take.

## 2016-04-24 ENCOUNTER — Telehealth: Payer: Self-pay | Admitting: Family Medicine

## 2016-04-24 MED ORDER — SPIRONOLACTONE 25 MG PO TABS: 25.0000 mg | ORAL_TABLET | Freq: Every day | ORAL | 2 refills | Status: DC

## 2016-04-24 NOTE — Telephone Encounter (Signed)
Pt states she wasn't calling to know the number of pills to take.  She was calling to follow up on a call she received stating that she needed to come to Dr. Nonda Lou office for a BMT.  Review patient's chart.  There was no indication that anyone had called.  Reviewed patient's lab results and provider's recommendation with her.  She stated understanding, but seemed to be having difficulty understanding that she now only needs to take 1 potassium pill per day now.  Asked to speak to her husband, she declined, stating she would rather we keep conversation between [place].  Lab appt scheduled.  Rx sent to pharmacy.  Med list updated. Will route message to [facility], CMA for patient to review provider recommendations with husband.     Notes Recorded by Ann Held, DO on 04/23/2016 at 10:22 AM EST Potassium is low--- Lets change your diuretic-- stop the hctz----- Start spironolactone 25 mg daily #30 2 refills Take only 1 potassium for now and recheck bmp next week

## 2016-04-24 NOTE — Telephone Encounter (Signed)
Caller name: Relationship to patient: Can be reached: Pharmacy:  [id number] - [place], [place] [phone] (Phone) [phone] (Fax)     Reason for call: Pharmacy states there is a drug interaction between the K-Dur and the Aldactone. Need call back to clarify Rx

## 2016-04-25 ENCOUNTER — Telehealth: Payer: Self-pay | Admitting: Emergency Medicine

## 2016-04-25 ENCOUNTER — Telehealth: Payer: Self-pay | Admitting: Family Medicine

## 2016-04-25 NOTE — Telephone Encounter (Signed)
Patient stated that her medication, spironolactone (ALDACTONE) 25 MG tablet, was not at the pharmacy. Please advise.    [id number] [place], [place]

## 2016-04-25 NOTE — Telephone Encounter (Signed)
-----   Message from Ann Held, DO sent at 04/25/2016  1:42 PM EST ----- Regarding: RE: Spironolactone :Script Clarification We d/c her potassium--- she is only taking 1 a day for few -- days +___ I/m well aware of interaction ----- Message ----- From: Emi Holes, CMA Sent: 04/25/2016  10:39 AM To: Ann Held, DO Subject: Spironolactone :Script Clarification           [facility] spironolactone (ALDACTONE) 25 MG tablet. Pharmacy comment states: SCRIPT CLARIFICATION: LEVEL 2 INTERACTION WITH POTASSIUM- MAY CAUSE SEVERE HYPERKALEMIA. Please advise.

## 2016-04-25 NOTE — Telephone Encounter (Signed)
[facility] for [facility]. Provider Gave pharmacy verbal confirmation to fill script per provider recommendation (see previous note).   Called pt to info pharmacy has been notified about Spironolactone 25 MG Oral tablet and that she should be able to pick medication up today once is has been filled by pharmacy. Pt verbalized understanding.

## 2016-04-25 NOTE — Telephone Encounter (Signed)
Received Script Clarification form from [facility]. Message forwarded to provider. Provider approved script for Spironolactone. Called pharmacy to give verbal authorization to fill rx.   Called pt to info pharmacy has been notified about Spironolactone 25 MG Oral tablet and that she should be able to pick medication up today once is has been filled by pharmacy. Pt verbalized understanding.

## 2016-04-28 NOTE — Telephone Encounter (Signed)
Spoke with patient, patient stated that she already started taking the potassium. Patient reported she already spoke to someone about the recommendations. Patient had no further questions or concerns at this time.

## 2016-04-30 ENCOUNTER — Telehealth: Payer: Self-pay | Admitting: Family Medicine

## 2016-04-30 NOTE — Telephone Encounter (Signed)
Also temazepam

## 2016-04-30 NOTE — Telephone Encounter (Signed)
Caller name:Chelbi Konja Relationship to patient: Can be reached: [phone] [facility]acy[facility] [place]  Reason for call:Requesting refill on Diazepam

## 2016-05-01 ENCOUNTER — Other Ambulatory Visit (INDEPENDENT_AMBULATORY_CARE_PROVIDER_SITE_OTHER): Payer: Medicare Other

## 2016-05-01 DIAGNOSIS — E876 Hypokalemia: Secondary | ICD-10-CM | POA: Diagnosis not present

## 2016-05-01 LAB — BASIC METABOLIC PANEL
BUN: 6 mg/dL (ref 6–23)
CHLORIDE: 103 meq/L (ref 96–112)
CO2: 28 mEq/L (ref 19–32)
CREATININE: 1.06 mg/dL (ref 0.40–1.20)
Calcium: 9.3 mg/dL (ref 8.4–10.5)
GFR: 66.19 mL/min (ref >60.00)
GLUCOSE: 79 mg/dL (ref 70–99)
POTASSIUM: 3.3 meq/L — AB (ref 3.5–5.1)
Sodium: 139 mEq/L (ref 135–145)

## 2016-05-01 MED ORDER — TEMAZEPAM 15 MG PO CAPS: ORAL_CAPSULE | ORAL | 0 refills | Status: DC

## 2016-05-01 NOTE — Telephone Encounter (Signed)
Rx printed and forwarded to PCP for review and signature.  Contract attached.

## 2016-05-01 NOTE — Telephone Encounter (Signed)
Diazepam Last filled: 04/18/16  Amt: 30, 1 Too early for refill   Temazepam Last filled: 02/22/16 Amt: 60, 0 Last OV:  04/18/16 No CSC or UDS on file.  Please advise.

## 2016-05-01 NOTE — Telephone Encounter (Signed)
Ok to refill temazepam z1--- we need contract and uds

## 2016-05-02 NOTE — Telephone Encounter (Signed)
Rx placed up front for pick up. 

## 2016-05-06 ENCOUNTER — Encounter: Payer: Self-pay | Admitting: Family Medicine

## 2016-05-06 DIAGNOSIS — Z79899 Other long term (current) drug therapy: Secondary | ICD-10-CM | POA: Diagnosis not present

## 2016-05-13 ENCOUNTER — Telehealth: Payer: Self-pay | Admitting: Family Medicine

## 2016-05-13 DIAGNOSIS — F411 Generalized anxiety disorder: Secondary | ICD-10-CM

## 2016-05-13 NOTE — Telephone Encounter (Signed)
Patient leaving for funeral and needs medications refilled see note below

## 2016-05-13 NOTE — Telephone Encounter (Signed)
Patient is requesting refill for Diazepam and [facility]opiclone [facility] [address] she is traveling to [place] for    Her number is [phone]

## 2016-05-13 NOTE — Telephone Encounter (Signed)
Caller name: Relationship to patient: Self Can be reached: [phone]  [facility]  [id number] [place], [place] [phone] (Phone) [phone] (Fax)    Reason for call: Request refills on cyclobenzaprine (FLEXERIL) 5 MG tablet [id number] (request 10 mg tablets)  traZODone (DESYREL) 50 MG tablet [id number]

## 2016-05-15 ENCOUNTER — Ambulatory Visit: Payer: Medicare Other | Admitting: Podiatry

## 2016-05-15 NOTE — Telephone Encounter (Signed)
Last ov 04/18/16.  diazepam last fill 04/18/16 #30 1, and eszopiclone last fill 07/31/15 no amount noted only refills and there is 3 refills. Please advise. LB

## 2016-05-19 ENCOUNTER — Telehealth: Payer: Self-pay | Admitting: Family Medicine

## 2016-05-19 MED ORDER — DIAZEPAM 10 MG PO TABS
ORAL_TABLET | ORAL | 1 refills | Status: AC
Start: 2016-05-19 — End: ?

## 2016-05-19 NOTE — Addendum Note (Signed)
Addended by: Marjory Lies on: 05/19/2016 03:37 PM   Modules accepted: Orders

## 2016-05-19 NOTE — Telephone Encounter (Signed)
Rx faxed. LB 

## 2016-05-19 NOTE — Telephone Encounter (Signed)
Ok to refill valium I did n't write the other one

## 2016-05-19 NOTE — Telephone Encounter (Signed)
Caller name: Relationship to patient: Self Can be reached: [phone]  Pharmacy:  Reason for call: Please call patient with her UA results

## 2016-05-19 NOTE — Telephone Encounter (Signed)
Printed Rx Valium, awaiting provider's signature. LB

## 2016-05-20 NOTE — Telephone Encounter (Signed)
Pt don't have any current UA . Last UA done on 04/18/16, resulted and spoke with pt. Pt can be forgetful at times. See note from 04/29/16. LB

## 2016-05-30 ENCOUNTER — Telehealth: Payer: Self-pay | Admitting: Family Medicine

## 2016-05-30 NOTE — Telephone Encounter (Signed)
Caller name: [facility] [address] [place] Can be reached: [phone]  Reason for call: Pt came in with RX for oral valium. He wants to make sure we are aware pt has vaginal valium as well since 2 different docs. Please call him back.

## 2016-05-30 NOTE — Telephone Encounter (Signed)
Inform pt -- -no more controlled substance of any kind from this office

## 2016-05-30 NOTE — Telephone Encounter (Signed)
[facility]---Dr. Louis Meckel (urologist at [facility]) prescribed the vaginal valium.  The patient had them filled on 05/01/16 #30 and d/c any more refills as did not want to take anymore. Currently patient is taking only oral valium.

## 2016-06-02 NOTE — Telephone Encounter (Signed)
We will not be giving any more controlled substances

## 2016-06-02 NOTE — Telephone Encounter (Signed)
Spoke with pt, pt states she was calling because when she went to pick up original Rx prescribed by provider pharmacy stated her account was block due to pt having same Rx prescribed by two different physician. Pt states she was currently in the hospital with the flu and has a follow up appointment tomorrow. Pt had no further questions or concerns.LB

## 2016-06-03 ENCOUNTER — Encounter: Payer: Self-pay | Admitting: Medical

## 2016-06-03 ENCOUNTER — Ambulatory Visit (INDEPENDENT_AMBULATORY_CARE_PROVIDER_SITE_OTHER): Payer: Medicare Other | Admitting: Medical

## 2016-06-03 VITALS — BP 138/86 | HR 88 | Temp 97.8°F | Resp 16 | Ht 64.0 in | Wt 136.2 lb

## 2016-06-03 DIAGNOSIS — J069 Acute upper respiratory infection, unspecified: Secondary | ICD-10-CM | POA: Diagnosis not present

## 2016-06-03 DIAGNOSIS — J0100 Acute maxillary sinusitis, unspecified: Secondary | ICD-10-CM

## 2016-06-03 DIAGNOSIS — Z8669 Personal history of other diseases of the nervous system and sense organs: Secondary | ICD-10-CM | POA: Diagnosis not present

## 2016-06-03 DIAGNOSIS — G4700 Insomnia, unspecified: Secondary | ICD-10-CM

## 2016-06-03 MED ORDER — FLUTICASONE PROPIONATE 50 MCG/ACT NA SUSP: 2.0000 | Freq: Every day | NASAL | 1 refills | Status: DC

## 2016-06-03 MED ORDER — AMOXICILLIN-POT CLAVULANATE 875-125 MG PO TABS: 1.0000 | ORAL_TABLET | Freq: Two times a day (BID) | ORAL | 0 refills | Status: DC

## 2016-06-03 MED ORDER — ESZOPICLONE 3 MG PO TABS: 3.0000 mg | ORAL_TABLET | Freq: Every day | ORAL | 0 refills | Status: DC

## 2016-06-03 MED ORDER — TEMAZEPAM 15 MG PO CAPS: ORAL_CAPSULE | ORAL | 0 refills | Status: DC

## 2016-06-03 MED ORDER — BUTALBITAL-APAP-CAFFEINE 50-325-40 MG PO TABS: 1.0000 | ORAL_TABLET | Freq: Four times a day (QID) | ORAL | 0 refills | Status: DC | PRN

## 2016-06-03 NOTE — Telephone Encounter (Signed)
Dr. Etter Sjogren,  Pt came in today and requested various meds. She characterized that you had been giving the controlled  meds and she used meds responsibly etc, etc. It was not until later Ivin Booty pointed out that your note section stated we will not be giving any more controlled meds. I apologize simply did not see the comment. Am I understanding that she was notified that we were not going to rx any more controlled meds on 06-02-2016 and she came in any way with me next day?  I asked front staff not to schedule her with me anymore if that was the case?   Thanks and sorry again for writing the meds.   Percell Miller

## 2016-06-03 NOTE — Telephone Encounter (Signed)
noted 

## 2016-06-03 NOTE — Progress Notes (Signed)
Pre visit review using our clinic review tool, if applicable. No additional management support is needed unless otherwise documented below in the visit note/SLS  

## 2016-06-03 NOTE — Patient Instructions (Addendum)
You appear to have sinus infection following 3 wk uri. Will rx flonase nasal spray and rx augmentin antibiotic.  For insomnia will refill you lunesta and restoril. But only for one month.  For your migraine ha will give limited number of fiorcet. If this does not stop ha or if you have gross motor or sensory function deficits with ha as explained the ED evaluation.  I want you to call back in one month and get refills from Dr. Etter Sjogren regarding your medication refills as some are controlled and sedating.   Follow up in 2 wks if needed for persisting sinus pressure or as needed  After pt left I saw Dr.Lowne note that we would no longer rx controlled meds for pt. Ivin Booty MA showed me the note after pt left. Pt had printed prescriptions. I notified office manager and front end manager not to schedule pt with me as it appears pt may have been aware of Dr. Etter Sjogren intent for [place] not to write the any more controlled meds but she came in anyway next day with me?(I am not sure of this) Also sent note to Dr. Etter Sjogren informing her I accidentally gave her controlled meds and did not see her note which was not easy to see immediately in our EMR system.

## 2016-06-03 NOTE — Telephone Encounter (Signed)
Please don't schedule pt with me anymore. Message in note section by Dr. Etter Sjogren stated we will not be giving any more controlled substances. No one informed me of this. Pt was scheduled with me and I accidentally gave her controlled meds.   Later Ivin Booty notified me she found comments in note section.  I am assuming patient new Dr. Etter Sjogren would not write and she came in anyway. So please make sure she is not scheduled with me in future.  Ivin Booty just placed FYI pt not to get anymore controlled med.

## 2016-06-03 NOTE — Progress Notes (Signed)
Subjective:    Patient ID: Brandi [name], female    DOB: Jun 14, 1946, 70 y.o.   MRN: [id number]  HPI  Pt in for 3 weeks of cough, nasal congestion and runny nose.   Pt has some green and yellow mucous drainage from her nose. Some sinus pain.   Pt seeing orthopedist. She will see PA this Thursday and will get percocet.  Pt states also has insominia. She states she uses exzoiclone some night and restoril on other nights. She states Dr Etter Sjogren has given both of these per pt. Pt understands she should never use together. She characterizes that she has understanding with Dr. Etter Sjogren regarding her meds and she describes using meds responsibly.(Dr. Etter Sjogren not in office today so was unable to verify)  Pt states she gets ha. Pt states she uses fioret. She has these for years. Failed various meds in the past for ha did not work per pt description. Pt had some on and off light sensitivity for past 3 weeks. Today light sensitivity little worse. No gross motor or sensory function deficits.Pt remembers years ago fiorecet helped a lot.    Review of Systems  Constitutional: Negative for chills, fatigue and fever.  HENT: Positive for congestion, sinus pain and sinus pressure. Negative for ear pain, sore throat and voice change.   Respiratory: Positive for cough. Negative for chest tightness, shortness of breath and wheezing.   Cardiovascular: Negative for chest pain and palpitations.  Gastrointestinal: Negative for abdominal pain, blood in stool, constipation and vomiting.  Musculoskeletal: Negative for back pain.  Skin: Negative for rash.  Neurological: Positive for headaches. Negative for dizziness, speech difficulty, weakness and numbness.       Some ha presently. She states mild. She states years diagnosed with migraine ha.  Hematological: Negative for adenopathy. Does not bruise/bleed easily.  Psychiatric/Behavioral: Positive for sleep disturbance. Negative for behavioral problems and confusion.   Past  Medical History:  Diagnosis Date  . Anxiety    takes Valium daily as needed  . Chronic back pain   . Chronic back pain   . Complication of anesthesia 1980s   "I stopped breathing on the table; I was gettin my wisdom teeth extracted"  . Constipation   . Depression    takes Lexapro daily  . Dysrhythmia    pt. reports that her heart skips sometimes   . GERD (gastroesophageal reflux disease)    takes Zantac daily  . History of blood transfusion    no abnormal reaction noted  . History of colon polyps    benign  . History of gastric ulcer   . History of GI bleed   . History of hiatal hernia   . History of stress test    done in [place]- done in  1992, told that it was normal.   . Hypertension   . Insomnia    takes Restoril and Trazodone nightly as needed  . Joint pain   . Joint swelling   . Nocturia   . Osteoarthritis   . Peripheral edema    occasionally but never been on fluid pill     Social History   Social History  . Marital status: Married    Spouse name: N/A  . Number of children: N/A  . Years of education: N/A   Occupational History  . retired [employer]    Social History Main Topics  . Smoking status: Never Smoker  . Smokeless tobacco: Never Used  . Alcohol use No  .  Drug use: No  . Sexual activity: Yes    Birth control/ protection: Post-menopausal   Other Topics Concern  . Not on file   Social History Narrative   Retired from [employer] in [place], exercise - walking   retired Environmental manager, Musician, librarian    Past Surgical History:  Procedure Laterality Date  . BACK SURGERY    . COLONOSCOPY    . ESOPHAGOGASTRODUODENOSCOPY N/A 06/16/2014   Procedure: ESOPHAGOGASTRODUODENOSCOPY (EGD);  Surgeon: Missy Sabins, MD;  Location: [facility] ENDOSCOPY;  Service: Endoscopy;  Laterality: N/A;  . JOINT REPLACEMENT    . LUMBAR FUSION    . SHOULDER SURGERY Left   . TOE SURGERY Right    "big toe was coming off"  . TOE SURGERY Left    "bone spur"  .  TOTAL HIP ARTHROPLASTY Left 02/26/2015   Procedure: TOTAL HIP ARTHROPLASTY ANTERIOR APPROACH;  Surgeon: Frederik Pear, MD;  Location: [facility];  Service: Orthopedics;  Laterality: Left;  . TOTAL HIP ARTHROPLASTY Right 03/21/2016   Procedure: TOTAL HIP ARTHROPLASTY ANTERIOR APPROACH;  Surgeon: Frederik Pear, MD;  Location: [facility];  Service: Orthopedics;  Laterality: Right;  . WISDOM TOOTH EXTRACTION  1980s    Family History  Problem Relation Age of Onset  . Cancer Mother     colon cancer  . Cancer Father     colon cancer  . Cancer Sister 34    died of colon cancer  . Heart disease Neg Hx     Allergies  Allergen Reactions  . Aspirin Other (See Comments)    Stomach ulcer, now inactive, OK with low dose ASA    Current Outpatient Prescriptions on File Prior to Visit  Medication Sig Dispense Refill  . aspirin EC 325 MG tablet Take 1 tablet (325 mg total) by mouth 2 (two) times daily. 30 tablet 0  . cyclobenzaprine (FLEXERIL) 5 MG tablet Take 1 tablet (5 mg total) by mouth 3 (three) times daily. 60 tablet 0  . diazepam (VALIUM) 10 MG tablet TAKE 1 TABLET BY MOUTH EVERY 12 HOURS AS NEEDED FOR ANXIETY 30 tablet 1  . diclofenac sodium (VOLTAREN) 1 % GEL APPLY 2 G TOPICALLY 4 (FOUR) TIMES DAILY. 100 g 3  . estrogen, conjugated,-medroxyprogesterone (PREMPRO) 0.3-1.5 MG tablet Take 1 tablet by mouth daily. 28 tablet 3  . Eszopiclone 3 MG TABS Take 3 mg by mouth at bedtime as needed (sleep). Lunesta  3  . hydrochlorothiazide (HYDRODIURIL) 25 MG tablet Take 1 tablet (25 mg total) by mouth daily. 30 tablet 11  . hydrocortisone cream 1 % Apply to affected area 2 times daily 15 g 0  . oxyCODONE-acetaminophen (PERCOCET) 10-325 MG tablet Take 1 tablet by mouth every 4 (four) hours as needed for pain. Pt. Reports that she sometimes takes 2 tabs. 60 tablet 0  . potassium chloride (K-DUR) 10 MEQ tablet Take 10 mEq by mouth daily.    . ranitidine (ZANTAC) 150 MG tablet Take 1 tablet (150 mg total) by mouth 2 (two)  times daily. 180 tablet 3  . senna (SENOKOT) 8.6 MG tablet Take 2 tablets by mouth daily.    Marland Kitchen spironolactone (ALDACTONE) 25 MG tablet Take 1 tablet (25 mg total) by mouth daily. 30 tablet 2  . temazepam (RESTORIL) 15 MG capsule 1-2 po qhs prn 60 capsule 0  . traZODone (DESYREL) 50 MG tablet TAKE 1 TABLET BY MOUTH AT BEDTIME (Patient taking differently: TAKE 1 TABLET BY MOUTH AT BEDTIME AS NEEDED) 30 tablet 0  .  amoxicillin (AMOXIL) 500 MG tablet Take 2,000 mg by mouth See admin instructions. Take 4 tablets (2000 mg) by mouth one before dental procedures - last procedure 03/06/16  0   No current facility-administered medications on file prior to visit.     BP 138/86 (BP Location: Left Arm, Patient Position: Sitting, Cuff Size: Large)   Pulse 88   Temp 97.8 F (36.6 C) (Oral)   Resp 16   Ht 5\' 4"  (1.626 m)   Wt 136 lb 4 oz (61.8 kg)   SpO2 100%   BMI 23.39 kg/m       Objective:   Physical Exam  General  Mental Status - Alert. General Appearance - Well groomed. Not in acute distress.  Skin Rashes- No Rashes.  HEENT Head- Normal. Ear Auditory Canal - Left- Normal. Right - Normal.Tympanic Membrane- Left- Normal. Right- Normal. Eye Sclera/Conjunctiva- Left- Normal. Right- Normal. Nose & Sinuses Nasal Mucosa- Left-  Boggy and Congested. Right-  Boggy and  Congested.Bilateral maxillary and frontal sinus pressure. Mouth & Throat Lips: Upper Lip- Normal: no dryness, cracking, pallor, cyanosis, or vesicular eruption. Lower Lip-Normal: no dryness, cracking, pallor, cyanosis or vesicular eruption. Buccal Mucosa- Bilateral- No Aphthous ulcers. Oropharynx- No Discharge or Erythema. Tonsils: Characteristics- Bilateral- No Erythema or Congestion. Size/Enlargement- Bilateral- No enlargement. Discharge- bilateral-None.  Neck Neck- Supple. No Masses.   Chest and Lung Exam Auscultation: Breath Sounds:-Clear even and unlabored.  Cardiovascular Auscultation:Rythm- Regular, rate and  rhythm. Murmurs & Other Heart Sounds:Ausculatation of the heart reveal- No Murmurs.  Lymphatic Head & Neck General Head & Neck Lymphatics: Bilateral: Description- No Localized lymphadenopathy.  Neurologic Cranial Nerve exam:- CN III-XII intact(No nystagmus), symmetric smile. Strength:- 5/5 equal and symmetric strength both upper and lower extremities. Symetric smile. NO drift. EOM intact.       Assessment & Plan:  You appear to have sinus infection following 3 wk uri. Will rx flonase nasal spray and rx augmentin antibiotic.  For insomnia will refill you lunesta and restoril. But only for one month.  For your migraine ha history will give limited number of fiorcet. If this does not stop ha or if you have gross motor or sensory function deficits with ha as explained the ED evaluation.  I want you to call back in one month and get refills from Dr. Etter Sjogren regarding your medication refills as some are controlled and sedating.   Pt will get percocet from ortho this Thursday. I discussed with her caution on not using fiorecet and percocet at the same time. And caution overall as she has a lot of meds that can sedate.   Follow up in 2 wks if needed for persisting sinus pressure or as needed

## 2016-06-03 NOTE — Telephone Encounter (Signed)
Patient has appt. Today with PA.

## 2016-06-05 ENCOUNTER — Telehealth: Payer: Self-pay

## 2016-06-05 ENCOUNTER — Telehealth: Payer: Self-pay | Admitting: Family Medicine

## 2016-06-05 ENCOUNTER — Other Ambulatory Visit: Payer: Self-pay | Admitting: Family Medicine

## 2016-06-05 NOTE — Telephone Encounter (Signed)
Relation to [id number] Call back [place]: [id number] [place], [place] [phone] (Phone) [phone] (Fax)     Reason for call:  Patient requesting a refill traZODone (DESYREL) 50 MG tablet, patient experiencing coughing, stuffy nose states she cant sleep at night due to congestion, please advise

## 2016-06-05 NOTE — Telephone Encounter (Signed)
Drug screen sent to be scanned. LB

## 2016-06-05 NOTE — Telephone Encounter (Signed)
Caller name: Relationship to patient: Self Can be reached: [phone]  Pharmacy[id number] [place], [place] [phone] (Phone) [phone] (Fax)     Reason for call: Refill traZODone (DESYREL) 50 MG tablet [id number] Patient saw Percell Miller this week and forgot to ask for this refill

## 2016-06-05 NOTE — Telephone Encounter (Signed)
She has 3 sleeping meds now. She needs to continue with whatever she has on hand and discuss which option works best with her with PCP before we prescribe anything else

## 2016-06-05 NOTE — Telephone Encounter (Signed)
See duplicate phone note dated for same. 

## 2016-06-05 NOTE — Telephone Encounter (Signed)
Patient would like to speak with nurse directly, please advise °

## 2016-06-05 NOTE — Telephone Encounter (Signed)
Dr. Charlett Blake- Please advise trazodone refill. Pt also has Eszopiclone and Restoril on med list. Last OV: 06/03/16 Last filled: 03/17/16, #30, 0 RF Sig: TAKE 1 TABLET BY MOUTH AT BEDTIME  Brandi [name]- She was seen 06/03/16 for her symptoms of cough and congestion. She should continue care and medications discussed at visit, and follow-up in office if no improvement in symptoms once antibiotics completed.

## 2016-06-05 NOTE — Telephone Encounter (Signed)
Pt notified of instructions and verbalized understanding. States she has an 'understanding' w/ PCP about which meds she takes when and will follow-up w/ PCP next week regarding refills.

## 2016-06-05 NOTE — Telephone Encounter (Signed)
Entry error. LB

## 2016-06-06 NOTE — Telephone Encounter (Signed)
Ann Held, DO  to [place], CMA  05/30/16   1:03 PM  Note    Inform pt -- -no more controlled substance of any kind from this office

## 2016-06-10 NOTE — Telephone Encounter (Signed)
Pt will be discharged.

## 2016-06-11 ENCOUNTER — Telehealth: Payer: Self-pay | Admitting: Medical

## 2016-06-11 NOTE — Telephone Encounter (Signed)
Please advise 

## 2016-06-11 NOTE — Telephone Encounter (Signed)
Pt needs office visit. She is on numerous sedating medications. She still complains of cough. All cough meds can be sedating.   10 days or so since last seen. So recommend she come in to discuss cough and her headache(see how she is and listen to lungs  Appointment with me or her pcp.

## 2016-06-11 NOTE — Telephone Encounter (Signed)
Caller name: Thierry Relation to pt: self Call back number: [phone] [id number] , [address]  Reason for call: Pt states was seen by PCP Saguier for coughing symptoms (06-03-16) and was given a prescription of butalbital-acetaminophen-caffeine (FIORICET, ESGIC) 50-325-40 MG tablet, pt states the rx did help out but still is having issues and that is needing the prescription refill since still having migraine and sinus issues, pt was only given 12 tablets of the prescription and is requesting if possible a refill for the prescription. Please advise.

## 2016-06-12 NOTE — Telephone Encounter (Signed)
Pt seeing Dr. Nani Ravens 06/13/2016 for persistent sx's.

## 2016-06-13 ENCOUNTER — Ambulatory Visit (HOSPITAL_BASED_OUTPATIENT_CLINIC_OR_DEPARTMENT_OTHER)
Admission: RE | Admit: 2016-06-13 | Discharge: 2016-06-13 | Disposition: A | Discharge status: Home / Self Care | Payer: Medicare Other | Source: Ambulatory Visit | Attending: Family Medicine | Admitting: Family Medicine

## 2016-06-13 ENCOUNTER — Telehealth: Payer: Self-pay | Admitting: Family Medicine

## 2016-06-13 ENCOUNTER — Ambulatory Visit: Payer: Medicare Other | Admitting: Family Medicine

## 2016-06-13 ENCOUNTER — Ambulatory Visit (INDEPENDENT_AMBULATORY_CARE_PROVIDER_SITE_OTHER): Payer: Medicare Other | Admitting: Family Medicine

## 2016-06-13 ENCOUNTER — Encounter: Payer: Self-pay | Admitting: Family Medicine

## 2016-06-13 VITALS — BP 142/98 | HR 102 | Temp 97.9°F | Resp 16 | Ht 64.0 in | Wt 132.4 lb

## 2016-06-13 DIAGNOSIS — J020 Streptococcal pharyngitis: Secondary | ICD-10-CM

## 2016-06-13 DIAGNOSIS — J329 Chronic sinusitis, unspecified: Secondary | ICD-10-CM

## 2016-06-13 DIAGNOSIS — J0110 Acute frontal sinusitis, unspecified: Secondary | ICD-10-CM | POA: Diagnosis not present

## 2016-06-13 DIAGNOSIS — G4700 Insomnia, unspecified: Secondary | ICD-10-CM

## 2016-06-13 DIAGNOSIS — R0989 Other specified symptoms and signs involving the circulatory and respiratory systems: Secondary | ICD-10-CM | POA: Diagnosis not present

## 2016-06-13 LAB — POCT RAPID STREP A (OFFICE): Rapid Strep A Screen: NEGATIVE

## 2016-06-13 MED ORDER — TEMAZEPAM 15 MG PO CAPS: ORAL_CAPSULE | ORAL | 0 refills | Status: AC

## 2016-06-13 MED ORDER — DOXYCYCLINE HYCLATE 100 MG PO TABS: 100.0000 mg | ORAL_TABLET | Freq: Two times a day (BID) | ORAL | 0 refills | Status: DC

## 2016-06-13 MED ORDER — MOMETASONE FUROATE 50 MCG/ACT NA SUSP: 2.0000 | Freq: Every day | NASAL | 12 refills | Status: DC

## 2016-06-13 MED ORDER — TRAZODONE HCL 50 MG PO TABS: 50.0000 mg | ORAL_TABLET | Freq: Every day | ORAL | 0 refills | Status: DC

## 2016-06-13 NOTE — Progress Notes (Signed)
Patient ID: Brandi [name], female    DOB: 26-Feb-1946  Age: 70 y.o. MRN: [id number]    Subjective:  Subjective  HPI Brandi [name] presents for sinus f/u--  Still having migraines -- fiorcet helped --- pt states she was only given enough for few days.   Pt still having sinus pressure and runny nose----  Pt is using flonase -- stopped it because she did not like it.   Review of Systems  Constitutional: Negative for chills and fever.  HENT: Positive for congestion, postnasal drip, rhinorrhea, sinus pressure and sore throat.   Respiratory: Positive for cough. Negative for chest tightness, shortness of breath and wheezing.   Cardiovascular: Negative for chest pain, palpitations and leg swelling.  Allergic/Immunologic: Negative for environmental allergies.    History Past Medical History:  Diagnosis Date  . Anxiety    takes Valium daily as needed  . Chronic back pain   . Chronic back pain   . Complication of anesthesia 1980s   "I stopped breathing on the table; I was gettin my wisdom teeth extracted"  . Constipation   . Depression    takes Lexapro daily  . Dysrhythmia    pt. reports that her heart skips sometimes   . GERD (gastroesophageal reflux disease)    takes Zantac daily  . History of blood transfusion    no abnormal reaction noted  . History of colon polyps    benign  . History of gastric ulcer   . History of GI bleed   . History of hiatal hernia   . History of stress test    done in [place]- done in  1992, told that it was normal.   . Hypertension   . Insomnia    takes Restoril and Trazodone nightly as needed  . Joint pain   . Joint swelling   . Nocturia   . Osteoarthritis   . Peripheral edema    occasionally but never been on fluid pill    She has a past surgical history that includes Toe Surgery (Right); Lumbar fusion; Shoulder surgery (Left); Esophagogastroduodenoscopy (N/A, 06/16/2014); Colonoscopy; Wisdom tooth extraction (1980s); Total hip arthroplasty  (Left, 02/26/2015); Joint replacement; Back surgery; Toe Surgery (Left); and Total hip arthroplasty (Right, 03/21/2016).   Her family history includes Cancer in her father and mother; Cancer (age of onset: 54) in her sister.She reports that she has never smoked. She has never used smokeless tobacco. She reports that she does not drink alcohol or use drugs.  Current Outpatient Prescriptions on File Prior to Visit  Medication Sig Dispense Refill  . amoxicillin-clavulanate (AUGMENTIN) 875-125 MG tablet Take 1 tablet by mouth 2 (two) times daily. 20 tablet 0  . cyclobenzaprine (FLEXERIL) 5 MG tablet Take 1 tablet (5 mg total) by mouth 3 (three) times daily. 60 tablet 0  . diazepam (VALIUM) 10 MG tablet TAKE 1 TABLET BY MOUTH EVERY 12 HOURS AS NEEDED FOR ANXIETY 30 tablet 1  . diclofenac sodium (VOLTAREN) 1 % GEL APPLY 2 G TOPICALLY 4 (FOUR) TIMES DAILY. 100 g 3  . estrogen, conjugated,-medroxyprogesterone (PREMPRO) 0.3-1.5 MG tablet Take 1 tablet by mouth daily. 28 tablet 3  . hydrochlorothiazide (HYDRODIURIL) 25 MG tablet Take 1 tablet (25 mg total) by mouth daily. 30 tablet 11  . oxyCODONE-acetaminophen (PERCOCET) 10-325 MG tablet Take 1 tablet by mouth every 4 (four) hours as needed for pain. Pt. Reports that she sometimes takes 2 tabs. 60 tablet 0  . potassium chloride (K-DUR) 10 MEQ tablet Take  10 mEq by mouth daily.    . ranitidine (ZANTAC) 150 MG tablet Take 1 tablet (150 mg total) by mouth 2 (two) times daily. 180 tablet 3  . amoxicillin (AMOXIL) 500 MG tablet Take 2,000 mg by mouth See admin instructions. Take 4 tablets (2000 mg) by mouth one before dental procedures - last procedure 03/06/16  0  . aspirin EC 325 MG tablet Take 1 tablet (325 mg total) by mouth 2 (two) times daily. (Patient not taking: Reported on 06/13/2016) 30 tablet 0  . fluticasone (FLONASE) 50 MCG/ACT nasal spray Place 2 sprays into both nostrils daily. (Patient not taking: Reported on 06/13/2016) 16 g 1  . hydrocortisone  cream 1 % Apply to affected area 2 times daily (Patient not taking: Reported on 06/13/2016) 15 g 0  . senna (SENOKOT) 8.6 MG tablet Take 2 tablets by mouth daily.    Marland Kitchen spironolactone (ALDACTONE) 25 MG tablet Take 1 tablet (25 mg total) by mouth daily. (Patient not taking: Reported on 06/13/2016) 30 tablet 2   No current facility-administered medications on file prior to visit.      Objective:  Objective  Physical Exam  Constitutional: She is oriented to person, place, and time. She appears well-developed and well-nourished.  HENT:  Right Ear: External ear normal.  Left Ear: External ear normal.  + PND + errythema  Eyes: Conjunctivae are normal. Right eye exhibits no discharge. Left eye exhibits no discharge.  Cardiovascular: Normal rate, regular rhythm and normal heart sounds.   No murmur heard. Pulmonary/Chest: Effort normal and breath sounds normal. No respiratory distress. She has no wheezes. She has no rales. She exhibits no tenderness.  Musculoskeletal: She exhibits no edema.  Lymphadenopathy:    She has cervical adenopathy.  Neurological: She is alert and oriented to person, place, and time.  Nursing note and vitals reviewed.  BP (!) 142/98 (BP Location: Right Arm, Patient Position: Sitting, Cuff Size: Normal)   Pulse (!) 102   Temp 97.9 F (36.6 C) (Oral)   Resp 16   Ht 5\' 4"  (1.626 m)   Wt 132 lb 6.4 oz (60.1 kg)   SpO2 98%   BMI 22.73 kg/m  Wt Readings from Last 3 Encounters:  06/13/16 132 lb 6.4 oz (60.1 kg)  06/03/16 136 lb 4 oz (61.8 kg)  04/18/16 139 lb 3.2 oz (63.1 kg)     Lab Results  Component Value Date   WBC 6.1 04/18/2016   HGB 10.7 (L) 04/18/2016   HCT 33.6 (L) 04/18/2016   PLT 308 04/18/2016   GLUCOSE 79 05/01/2016   ALT 7 04/18/2016   AST 9 (L) 04/18/2016   NA 139 05/01/2016   K 3.3 (L) 05/01/2016   CL 103 05/01/2016   CREATININE 1.06 05/01/2016   BUN 6 05/01/2016   CO2 28 05/01/2016   TSH 0.58 09/04/2015   INR 0.95 03/13/2016    No  results found.   Assessment & Plan:  Plan  I have discontinued Brandi [name]'s Eszopiclone, Eszopiclone, and butalbital-acetaminophen-caffeine. I have also changed her traZODone. Additionally, I am having her start on mometasone and doxycycline. Lastly, I am having her maintain her diclofenac sodium, estrogen (conjugated)-medroxyprogesterone, ranitidine, hydrocortisone cream, hydrochlorothiazide, amoxicillin, oxyCODONE-acetaminophen, aspirin EC, senna, cyclobenzaprine, spironolactone, potassium chloride, diazepam, fluticasone, amoxicillin-clavulanate, and temazepam.  Meds ordered this encounter  Medications  . mometasone (NASONEX) 50 MCG/ACT nasal spray    Sig: Place 2 sprays into the nose daily.    Dispense:  17 g    Refill:  12  . doxycycline (VIBRA-TABS) 100 MG tablet    Sig: Take 1 tablet (100 mg total) by mouth 2 (two) times daily.    Dispense:  20 tablet    Refill:  0  . traZODone (DESYREL) 50 MG tablet    Sig: Take 1 tablet (50 mg total) by mouth at bedtime.    Dispense:  30 tablet    Refill:  0  . temazepam (RESTORIL) 15 MG capsule    Sig: 1-2 po qhs prn    Dispense:  60 capsule    Refill:  0    Problem List Items Addressed This Visit      Unprioritized   Sinusitis, chronic   Relevant Medications   mometasone (NASONEX) 50 MCG/ACT nasal spray   doxycycline (VIBRA-TABS) 100 MG tablet   Other Relevant Orders   CT MAXILLOFACIAL LTD WO CM (Completed)   Insomnia   Relevant Medications   traZODone (DESYREL) 50 MG tablet   temazepam (RESTORIL) 15 MG capsule    Other Visit Diagnoses    Acute frontal sinusitis, recurrence not specified    -  Primary   Relevant Medications   mometasone (NASONEX) 50 MCG/ACT nasal spray   doxycycline (VIBRA-TABS) 100 MG tablet   Streptococcal sore throat       Relevant Orders   POCT rapid strep A (Completed)      Follow-up: Return if symptoms worsen or fail to improve.  Ann Held, DO

## 2016-06-13 NOTE — Progress Notes (Signed)
Pre visit review using our clinic review tool, if applicable. No additional management support is needed unless otherwise documented below in the visit note. 

## 2016-06-13 NOTE — Patient Instructions (Signed)

## 2016-06-13 NOTE — Telephone Encounter (Signed)
Patient dismissed from [facility] by Roma Schanz MD , effective June 10, 2016. Dismissal letter sent out by certified / registered mail. DAJ

## 2016-06-19 ENCOUNTER — Telehealth: Payer: Self-pay | Admitting: Family Medicine

## 2016-06-19 NOTE — Telephone Encounter (Signed)
Pt called in to request a message sent to PCP stating:      Dr. Etter Sjogren - [name],  You have been the most kind, knowledgeable and easy to talk to. I just received your letter. I will abide by your comments. The world would be a better place if all of the Dr.s were as wonderful as you.   Thank you for listening to my aches and pains and getting me to [facility] when I was really sick. You have great bedside manor that really touches my heart. I wish you and hubby a wonderful and prosperous family together. I pray every night that your family have good health.     With warmest regards,  [name]    PS   No reply necessary  I appreciate all that you've done for me

## 2016-07-01 DIAGNOSIS — Z8249 Family history of ischemic heart disease and other diseases of the circulatory system: Secondary | ICD-10-CM | POA: Diagnosis not present

## 2016-07-01 DIAGNOSIS — Z96643 Presence of artificial hip joint, bilateral: Secondary | ICD-10-CM | POA: Diagnosis not present

## 2016-07-01 DIAGNOSIS — Z79899 Other long term (current) drug therapy: Secondary | ICD-10-CM | POA: Diagnosis not present

## 2016-07-01 DIAGNOSIS — G43909 Migraine, unspecified, not intractable, without status migrainosus: Secondary | ICD-10-CM | POA: Diagnosis not present

## 2016-07-01 DIAGNOSIS — I119 Hypertensive heart disease without heart failure: Secondary | ICD-10-CM | POA: Diagnosis not present

## 2016-07-01 DIAGNOSIS — E7800 Pure hypercholesterolemia, unspecified: Secondary | ICD-10-CM | POA: Diagnosis not present

## 2016-07-04 ENCOUNTER — Other Ambulatory Visit: Payer: Self-pay

## 2016-07-04 MED ORDER — SPIRONOLACTONE 25 MG PO TABS: 25.0000 mg | ORAL_TABLET | Freq: Every day | ORAL | 2 refills | Status: DC

## 2016-07-07 ENCOUNTER — Other Ambulatory Visit: Payer: Self-pay | Admitting: Family Medicine

## 2016-07-07 ENCOUNTER — Telehealth: Payer: Self-pay | Admitting: Family Medicine

## 2016-07-07 MED ORDER — OSELTAMIVIR PHOSPHATE 75 MG PO CAPS: 75.0000 mg | ORAL_CAPSULE | Freq: Two times a day (BID) | ORAL | 0 refills | Status: DC

## 2016-07-07 MED ORDER — SPIRONOLACTONE 25 MG PO TABS: 25.0000 mg | ORAL_TABLET | Freq: Every day | ORAL | 1 refills | Status: DC

## 2016-07-07 NOTE — Telephone Encounter (Signed)
Relation to [id number] Call back [place]: [id number] [place], [place] [phone] (Phone) [phone] (Fax)     Reason for call:  Coughing, head cold x3 requesting Tamiflu, please advise

## 2016-07-07 NOTE — Telephone Encounter (Signed)
tamiflu 75 mg bid x 5 days  

## 2016-07-07 NOTE — Telephone Encounter (Signed)
Sent in tamifu.as instructed. Called the patient/no answer and voice mail not set up yet???

## 2016-07-07 NOTE — Telephone Encounter (Signed)
Patient has had productive cough. Headache,not sure if fever, sore throat, all symptoms for one week. Patient is taking coricidin.

## 2016-07-07 NOTE — Telephone Encounter (Signed)
Attempted several times this afternoon to contact this patient.  No answer/no voice mail set up so unable to contact her to let her know medication sent in.

## 2016-07-08 ENCOUNTER — Telehealth: Payer: Self-pay | Admitting: Family Medicine

## 2016-07-08 NOTE — Telephone Encounter (Signed)
Patient informed prescription sent in

## 2016-07-08 NOTE — Telephone Encounter (Signed)
I can not because of what transpired --- she can try [facility] or [facility]

## 2016-07-08 NOTE — Telephone Encounter (Signed)
Called the patient no answer///no voice mail set up. 

## 2016-07-08 NOTE — Telephone Encounter (Signed)
Patient called very upset and crying. She stated that Dr. Etter Sjogren is the best doctor she has ever had and she doesn't want another doctor. She requested that I put a note back to let Dr. Etter Sjogren know that she will never take another pain pill. She will go to pain management as suggested and is requesting that Dr. Etter Sjogren reconsider her decision to dismiss her. She stated that no other [place] office will take her as a new patient and to get another doctor she will have to go out of town. Please advise.

## 2016-07-08 NOTE — Telephone Encounter (Signed)
Called the patient again no answer/no voice mail set up.

## 2016-07-09 ENCOUNTER — Emergency Department (HOSPITAL_COMMUNITY)
Admission: EM | Admit: 2016-07-09 | Discharge: 2016-07-09 | Disposition: A | Discharge status: Home / Self Care | Payer: Medicare Other | Attending: Emergency Medicine | Admitting: Emergency Medicine

## 2016-07-09 ENCOUNTER — Emergency Department (HOSPITAL_COMMUNITY): Payer: Medicare Other

## 2016-07-09 ENCOUNTER — Encounter (HOSPITAL_COMMUNITY): Payer: Self-pay

## 2016-07-09 DIAGNOSIS — J40 Bronchitis, not specified as acute or chronic: Secondary | ICD-10-CM | POA: Diagnosis not present

## 2016-07-09 DIAGNOSIS — I10 Essential (primary) hypertension: Secondary | ICD-10-CM | POA: Insufficient documentation

## 2016-07-09 DIAGNOSIS — K5900 Constipation, unspecified: Secondary | ICD-10-CM | POA: Diagnosis not present

## 2016-07-09 DIAGNOSIS — Z79899 Other long term (current) drug therapy: Secondary | ICD-10-CM | POA: Diagnosis not present

## 2016-07-09 DIAGNOSIS — Z7982 Long term (current) use of aspirin: Secondary | ICD-10-CM | POA: Diagnosis not present

## 2016-07-09 DIAGNOSIS — Z96643 Presence of artificial hip joint, bilateral: Secondary | ICD-10-CM | POA: Diagnosis not present

## 2016-07-09 DIAGNOSIS — R05 Cough: Secondary | ICD-10-CM | POA: Diagnosis not present

## 2016-07-09 MED ORDER — BENZONATATE 100 MG PO CAPS: 100.0000 mg | ORAL_CAPSULE | Freq: Three times a day (TID) | ORAL | 0 refills | Status: DC | PRN

## 2016-07-09 MED ORDER — POLYETHYLENE GLYCOL 3350 17 G PO PACK: 17.0000 g | PACK | Freq: Every day | ORAL | 0 refills | Status: DC

## 2016-07-09 NOTE — Discharge Instructions (Signed)
Your symptoms are consistent with bronchitis. Your chest x-ray was normal. You can try tessalon for you cough as needed. You can also try hot tea with honey for your sore throat. Your cough can last up to three weeks or so. Please seek medical care if you start having fevers or shortness of breath. For your constipation, please try Miralax.

## 2016-07-09 NOTE — Telephone Encounter (Signed)
Patient called back wanting to see what the answer to her being accepted back into the practice. I informed the patient that Dr. Carollee Herter would not be accepting her back and that she could try [facility] or [facility]. She then asked if the she could go to any other [facility] doctors. I told the patient she could go to any other [place] doctor expect the [facility] practices. Patient verbalized understanding.

## 2016-07-09 NOTE — ED Provider Notes (Signed)
[facility] DEPT Provider Note   CSN: [id number] Arrival date & time: 07/09/16  0803     History   Chief Complaint Chief Complaint  Patient presents with  . Cough    HPI Brandi [name] is a 71 y.o. female with PMH HTN, chronic constipation, GERD, Chronic Sinusitis who presents with productive cough, ear pain, sore throat, and constipation   HPI Patient reports of productive cough that is green/yellow for 1 week. Has chest pain only with cough. Also has frontal headache and stomach aches for 1 week. No blurred vision or neck stiffness. Reports of rhinorrhea/nasal congestion. Denies having fevers but reports of sweating at night. Denies sinus pressure or pain. Reports of myalgias. No nausea/vomiting. No sick contacts Reports of history of intermittent constipation. 6 months ago she had diarrhea, but for the past 3 weeks she has had constipation. Her last BM was 3 weeks ago. She usually uses senokot but has not been using because she thought her colon may not have anything in it since she had diarrhea prior to this. Has not tried any medications for her symptoms.   Past Medical History:  Diagnosis Date  . Anxiety    takes Valium daily as needed  . Chronic back pain   . Chronic back pain   . Complication of anesthesia 1980s   "I stopped breathing on the table; I was gettin my wisdom teeth extracted"  . Constipation   . Depression    takes Lexapro daily  . Dysrhythmia    pt. reports that her heart skips sometimes   . GERD (gastroesophageal reflux disease)    takes Zantac daily  . History of blood transfusion    no abnormal reaction noted  . History of colon polyps    benign  . History of gastric ulcer   . History of GI bleed   . History of hiatal hernia   . History of stress test    done in [place]- done in  1992, told that it was normal.   . Hypertension   . Insomnia    takes Restoril and Trazodone nightly as needed  . Joint pain   . Joint swelling   . Nocturia   .  Osteoarthritis   . Peripheral edema    occasionally but never been on fluid pill    Patient Active Problem List   Diagnosis Date Noted  . Arthritis of right hip 03/21/2016  . Primary osteoarthritis of right hip 03/20/2016  . Right ear impacted cerumen 03/12/2016  . Fall at home 10/21/2015  . Leg pain 09/27/2015  . Fatigue 04/22/2015  . Primary osteoarthritis of left hip 02/25/2015  . Pre-op evaluation 02/22/2015  . Sinusitis, chronic 01/30/2015  . Insomnia 01/30/2015  . Lymphadenopathy 11/24/2014  . Hypokalemia 06/15/2014  . Rectal bleeding 06/15/2014  . AKI (acute kidney injury) ([place]) 06/15/2014  . Essential hypertension, benign 04/10/2014  . Tachycardia 04/10/2014  . GERD (gastroesophageal reflux disease) 11/08/2013  . Chronic constipation 11/08/2013    Past Surgical History:  Procedure Laterality Date  . BACK SURGERY    . COLONOSCOPY    . ESOPHAGOGASTRODUODENOSCOPY N/A 06/16/2014   Procedure: ESOPHAGOGASTRODUODENOSCOPY (EGD);  Surgeon: Missy Sabins, MD;  Location: [facility] ENDOSCOPY;  Service: Endoscopy;  Laterality: N/A;  . JOINT REPLACEMENT    . LUMBAR FUSION    . SHOULDER SURGERY Left   . TOE SURGERY Right    "big toe was coming off"  . TOE SURGERY Left    "bone spur"  .  TOTAL HIP ARTHROPLASTY Left 02/26/2015   Procedure: TOTAL HIP ARTHROPLASTY ANTERIOR APPROACH;  Surgeon: Frederik Pear, MD;  Location: [facility];  Service: Orthopedics;  Laterality: Left;  . TOTAL HIP ARTHROPLASTY Right 03/21/2016   Procedure: TOTAL HIP ARTHROPLASTY ANTERIOR APPROACH;  Surgeon: Frederik Pear, MD;  Location: [place];  Service: Orthopedics;  Laterality: Right;  . WISDOM TOOTH EXTRACTION  1980s    OB History    No data available       Home Medications    Prior to Admission medications   Medication Sig Start Date End Date Taking? Authorizing Provider  amoxicillin (AMOXIL) 500 MG tablet Take 2,000 mg by mouth See admin instructions. Take 4 tablets (2000 mg) by mouth one before dental procedures -  last procedure 03/06/16 02/24/16   Historical Provider, MD  amoxicillin-clavulanate (AUGMENTIN) 875-125 MG tablet Take 1 tablet by mouth 2 (two) times daily. Patient not taking: Reported on 07/09/2016 06/03/16   Mackie Pai, PA-C  aspirin EC 325 MG tablet Take 1 tablet (325 mg total) by mouth 2 (two) times daily. Patient not taking: Reported on 06/13/2016 03/21/16   Leighton Parody, PA-C  benzonatate (TESSALON PERLES) 100 MG capsule Take 1-2 capsules (100-200 mg total) by mouth 3 (three) times daily as needed for cough. 07/09/16   Smiley Houseman, MD  cyclobenzaprine (FLEXERIL) 5 MG tablet Take 1 tablet (5 mg total) by mouth 3 (three) times daily. 04/18/16   Yvonne R Lowne Chase, DO  diazepam (VALIUM) 10 MG tablet TAKE 1 TABLET BY MOUTH EVERY 12 HOURS AS NEEDED FOR ANXIETY 05/19/16   Alferd Apa Lowne Chase, DO  diclofenac sodium (VOLTAREN) 1 % GEL APPLY 2 G TOPICALLY 4 (FOUR) TIMES DAILY. 02/05/15   Rosalita Chessman Chase, DO  doxycycline (VIBRA-TABS) 100 MG tablet Take 1 tablet (100 mg total) by mouth 2 (two) times daily. 06/13/16   Rosalita Chessman Chase, DO  estrogen, conjugated,-medroxyprogesterone (PREMPRO) 0.3-1.5 MG tablet Take 1 tablet by mouth daily. 09/05/15   Alferd Apa Lowne Chase, DO  fluticasone (FLONASE) 50 MCG/ACT nasal spray Place 2 sprays into both nostrils daily. Patient not taking: Reported on 06/13/2016 06/03/16   Mackie Pai, PA-C  hydrochlorothiazide (HYDRODIURIL) 25 MG tablet Take 1 tablet (25 mg total) by mouth daily. 11/23/15   Rosalita Chessman Chase, DO  hydrocortisone cream 1 % Apply to affected area 2 times daily Patient not taking: Reported on 06/13/2016 10/11/15   Okey Regal, PA-C  mometasone (NASONEX) 50 MCG/ACT nasal spray Place 2 sprays into the nose daily. 06/13/16   Rosalita Chessman Chase, DO  oseltamivir (TAMIFLU) 75 MG capsule Take 1 capsule (75 mg total) by mouth 2 (two) times daily. Take for 5 days 07/07/16   Rosalita Chessman Chase, DO  oxyCODONE-acetaminophen (PERCOCET)  10-325 MG tablet Take 1 tablet by mouth every 4 (four) hours as needed for pain. Pt. Reports that she sometimes takes 2 tabs. 03/21/16   Leighton Parody, PA-C  polyethylene glycol [facility]) packet Take 17 g by mouth daily. For constipation 07/09/16   Smiley Houseman, MD  potassium chloride (K-DUR) 10 MEQ tablet Take 10 mEq by mouth daily.    Historical Provider, MD  ranitidine (ZANTAC) 150 MG tablet Take 1 tablet (150 mg total) by mouth 2 (two) times daily. 10/09/15   Rosalita Chessman Chase, DO  senna (SENOKOT) 8.6 MG tablet Take 2 tablets by mouth daily.    Historical Provider, MD  spironolactone (ALDACTONE) 25 MG tablet Take 1 tablet (  25 mg total) by mouth daily. 07/07/16   Rosalita Chessman Chase, DO  temazepam (RESTORIL) 15 MG capsule 1-2 po qhs prn 06/13/16   Alferd Apa Lowne Chase, DO  traZODone (DESYREL) 50 MG tablet Take 1 tablet (50 mg total) by mouth at bedtime. 06/13/16   Ann Held, DO    Family History Family History  Problem Relation Age of Onset  . Cancer Mother     colon cancer  . Cancer Father     colon cancer  . Cancer Sister 25    died of colon cancer  . Heart disease Neg Hx     Social History Social History  Substance Use Topics  . Smoking status: Never Smoker  . Smokeless tobacco: Never Used  . Alcohol use No     Allergies   Aspirin   Review of Systems Review of Systems  Constitutional: Negative for chills and fever.  HENT: Positive for congestion, ear pain, rhinorrhea, sinus pain and sore throat. Negative for sinus pressure, trouble swallowing and voice change.   Respiratory: Positive for cough. Negative for shortness of breath.   Gastrointestinal: Positive for abdominal pain and constipation. Negative for nausea and vomiting.  Musculoskeletal: Positive for myalgias.     Physical Exam Updated Vital Signs BP 102/75   Pulse 83   Temp 98.3 F (36.8 C) (Oral)   Resp 11   Ht 5\' 4"  (1.626 m)   Wt 59.4 kg   SpO2 100%   BMI 22.49 kg/m    Physical Exam  Constitutional: She is oriented to person, place, and time. She appears well-developed and well-nourished. No distress.  HENT:  Right Ear: External ear normal.  Left Ear: External ear normal.  Mouth/Throat: Oropharynx is clear and moist. No oropharyngeal exudate.  Eyes: Conjunctivae and EOM are normal. Right eye exhibits no discharge. Left eye exhibits no discharge.  Neck: Normal range of motion. Neck supple.  Cardiovascular: Normal rate, regular rhythm, normal heart sounds and intact distal pulses.   Pulmonary/Chest: Effort normal and breath sounds normal. No respiratory distress. She has no wheezes. She has no rales.  Abdominal: Soft. Bowel sounds are normal. She exhibits no distension. There is no tenderness.  Musculoskeletal: She exhibits no edema or tenderness.  Lymphadenopathy:    She has no cervical adenopathy.  Neurological: She is alert and oriented to person, place, and time.  Skin: Skin is warm and dry. Capillary refill takes less than 2 seconds.  Psychiatric: She has a normal mood and affect. Her behavior is normal.     ED Treatments / Results  Labs (all labs ordered are listed, but only abnormal results are displayed) Labs Reviewed - No data to display  EKG  EKG Interpretation None       Radiology Dg Chest 2 View  Result Date: 07/09/2016 CLINICAL DATA:  Productive cough for 1 week. EXAM: CHEST  2 VIEW COMPARISON:  11/23/2015 FINDINGS: Both lungs are clear. Heart and mediastinum are within normal limits. The trachea is midline. No pleural effusions. No acute bone abnormality. IMPRESSION: No active cardiopulmonary disease. Electronically Signed   By: Markus Daft M.D.   On: 07/09/2016 09:10    Procedures Procedures (including critical care time)  Medications Ordered in ED Medications - No data to display   Initial Impression / Assessment and Plan / ED Course  I have reviewed the triage vital signs and the nursing notes.  Pertinent labs &  imaging results that were available during my care of the  patient were reviewed by me and considered in my medical decision making (see chart for details).  Patient's symptoms consistent with viral bronchitis. Her abdomen is soft, nontender with good bowel sounds. Vitals are stable and patient is afebrile. Prescribe Tessalon Pearles PRN for cough and Miralax for constipation. Return precautions discussed. Follow up with PCP within the next week.   Final Clinical Impressions(s) / ED Diagnoses   Final diagnoses:  Bronchitis  Constipation, unspecified constipation type    New Prescriptions Discharge Medication List as of 07/09/2016 10:40 AM    START taking these medications   Details  benzonatate (TESSALON PERLES) 100 MG capsule Take 1-2 capsules (100-200 mg total) by mouth 3 (three) times daily as needed for cough., Starting Wed 07/09/2016, Print    polyethylene glycol (MIRALAX) packet Take 17 g by mouth daily. For constipation, Starting Wed 07/09/2016, Print         Smiley Houseman, MD 07/09/16 [place], DO 07/09/16 1341

## 2016-07-09 NOTE — ED Triage Notes (Signed)
Patient here with cough x 1 week with productive cough, ear pain, sore throat and constipation. Patient in no distress, alert and oriented

## 2016-07-12 IMAGING — CT CT HIP*R* W/O CM
2 of 4 series · 7 of 46 positions shown, 8 images · non-contrast
Comparison: Right hip radiographs today.  Pelvic CT 06/15/2014.

CLINICAL DATA: Right hip pain after falling yesterday. History of
osteoarthritis and chronic hip pain. History of left total hip
arthroplasty.

EXAM:
CT OF THE RIGHT HIP WITHOUT CONTRAST
TECHNIQUE: Multidetector CT imaging of the right hip was performed according to
the standard protocol. Multiplanar CT image reconstructions were
also generated.

[Series 202: soft tissue · axial · 0.31mm/px · z∈[+115,+293]mm · 4 of 121 slices shown, 5 images]
[im 16/121  soft-tissue]
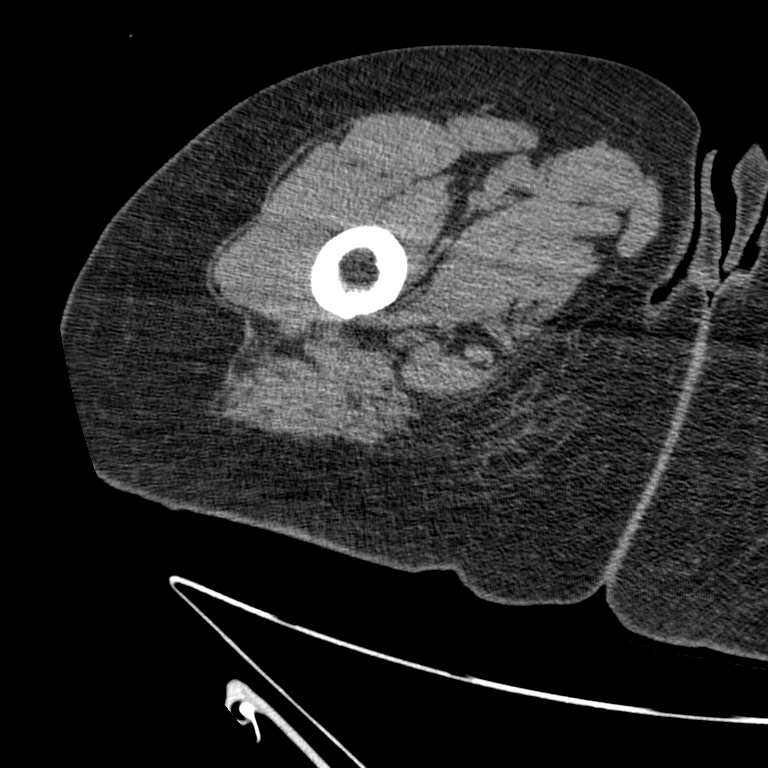
[im 16/121  bone]
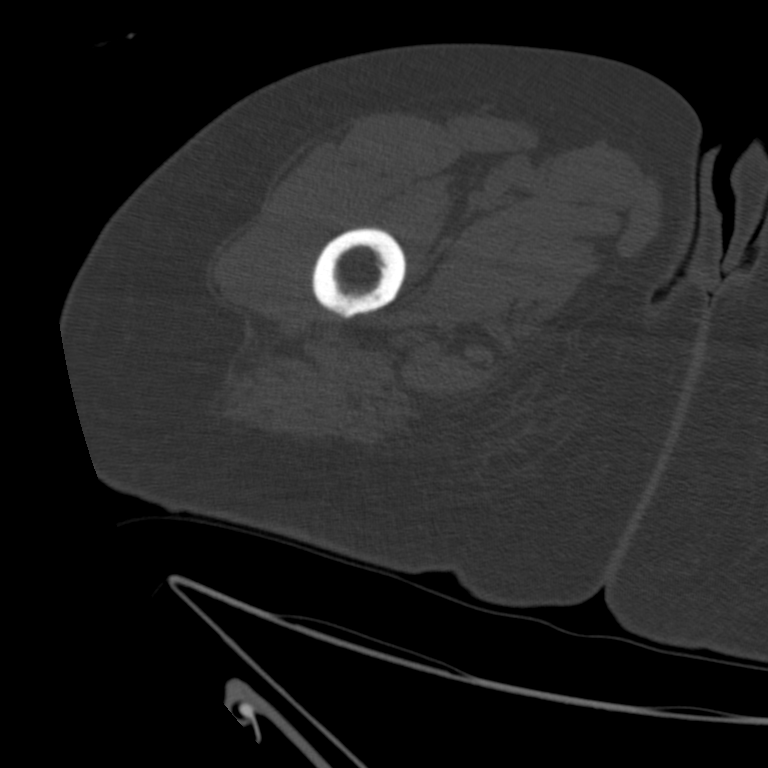
[im 47/121  soft-tissue]
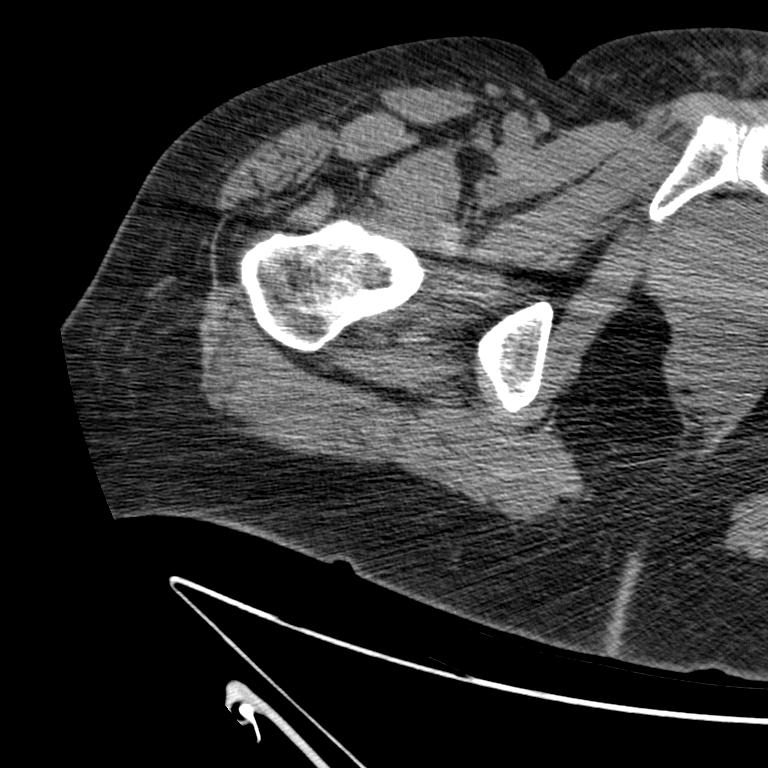
[im 74/121  soft-tissue]
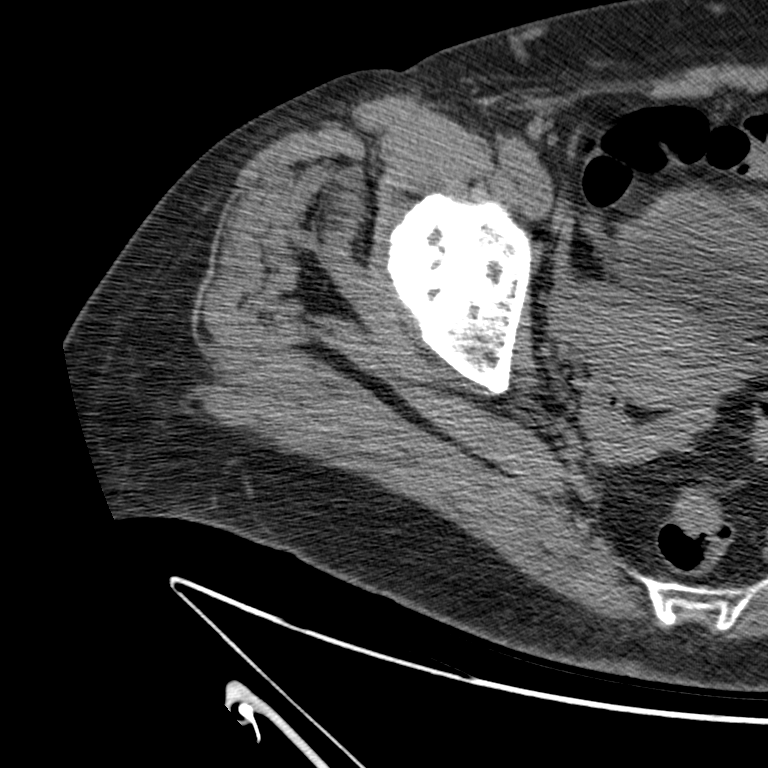
[im 105/121  soft-tissue]
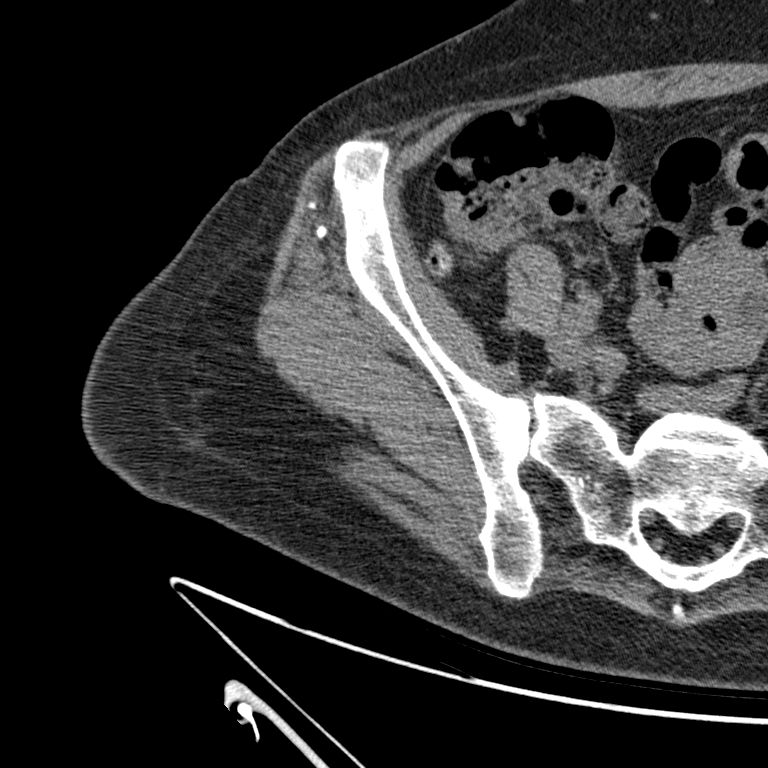

[Series 207: cor st · coronal · 0.31mm/px · 3 of 61 slices shown]
[im 21/61  soft-tissue]
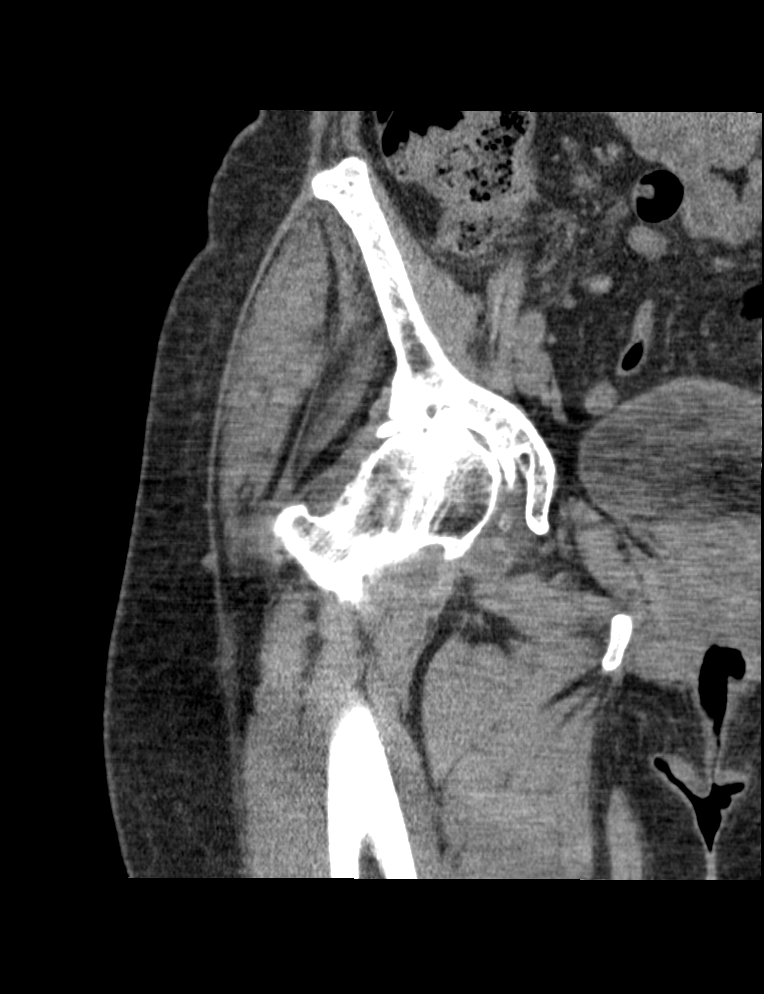
[im 27/61  soft-tissue]
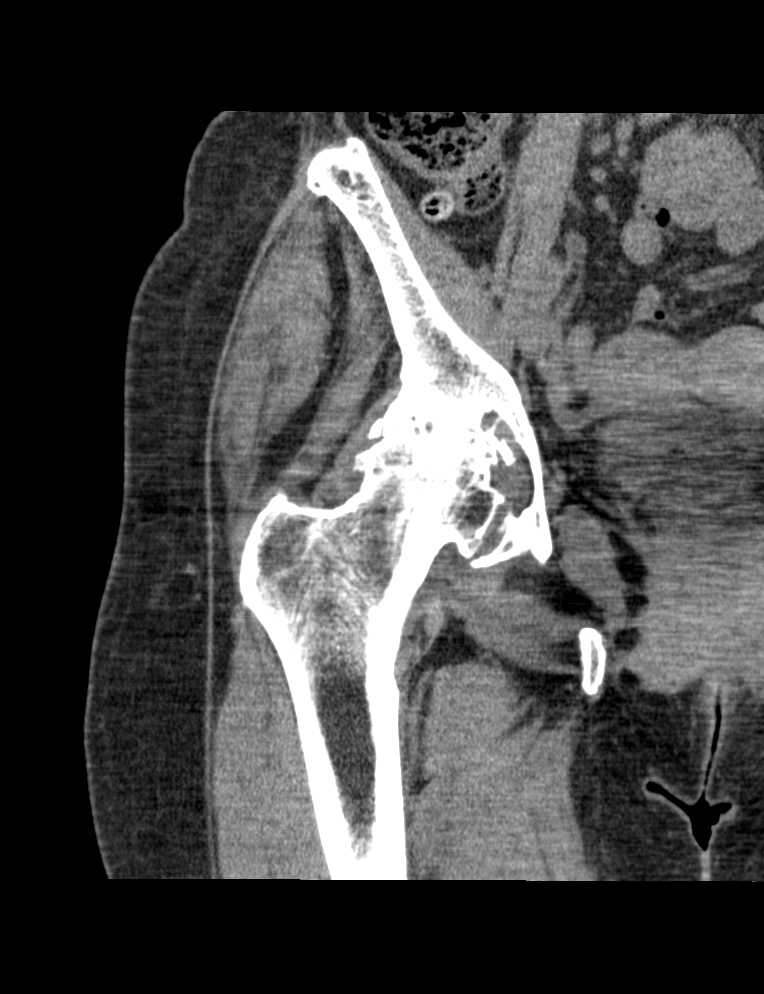
[im 34/61  soft-tissue]
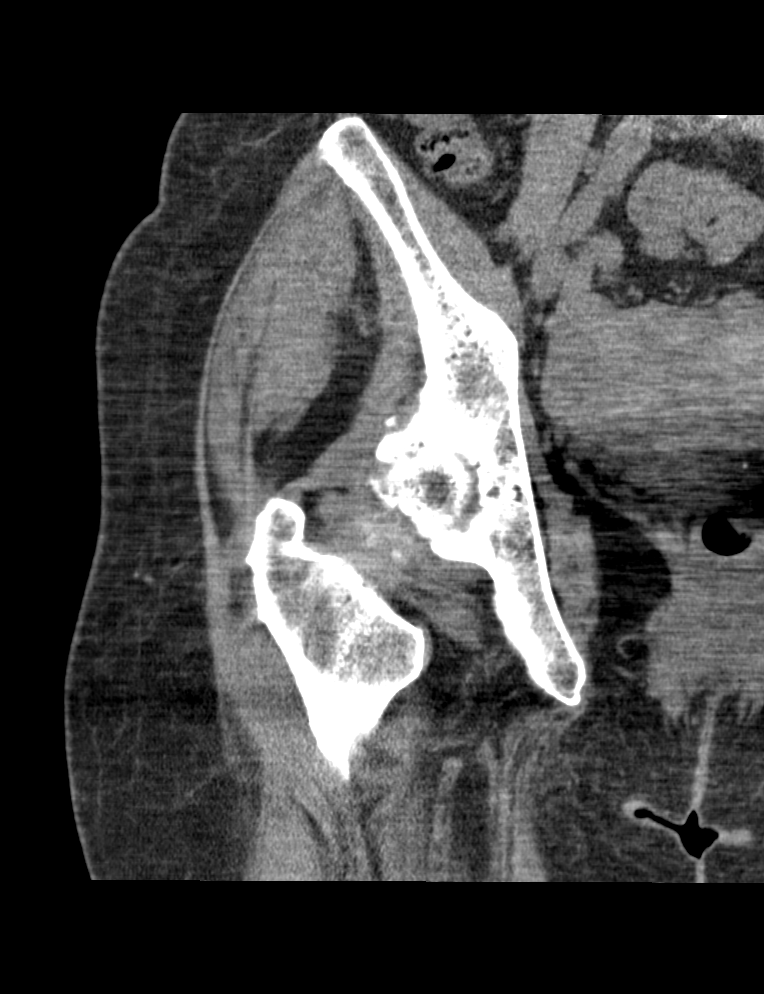

[7 of 46 positions shown; findings below may reference images not displayed]

FINDINGS: The bones are adequately mineralized. There is no evidence of acute
fracture or dislocation. There is advanced osteoarthritis of the
right hip which has progressed from the prior pelvic CT. There is
diffuse joint space loss with bone-on-bone apposition, osteophytes
and subchondral cyst formation in the femoral head and acetabulum.
There is a small loose body anterior medially in the joint. There is
a small right hip joint effusion. No evidence of femoral head
avascular necrosis.

Mild degenerative changes are present at the right sacroiliac joint
and symphysis pubis. Postsurgical changes from previous lower lumbar
fusion are partially imaged.

No focal soft tissue abnormalities observed. Fibroid uterus is
partially imaged, grossly stable.
IMPRESSION: 1. No acute osseous findings at the right hip.
2. Severe right hip osteoarthritis, progressive from pelvic CT of 15
months ago.

## 2016-07-16 DIAGNOSIS — M1611 Unilateral primary osteoarthritis, right hip: Secondary | ICD-10-CM | POA: Diagnosis not present

## 2016-07-16 DIAGNOSIS — M25552 Pain in left hip: Secondary | ICD-10-CM | POA: Diagnosis not present

## 2016-07-17 ENCOUNTER — Ambulatory Visit: Payer: Medicare Other | Admitting: Allergy & Immunology

## 2016-07-31 ENCOUNTER — Other Ambulatory Visit: Payer: Self-pay | Admitting: Family Medicine

## 2016-07-31 DIAGNOSIS — F411 Generalized anxiety disorder: Secondary | ICD-10-CM

## 2016-08-01 NOTE — Telephone Encounter (Signed)
Last refill on 05/19/2016  #30 with 0 refills Contract on 05/05/2016---No UDS Last office visit 06/13/2016--no upcoming appt.

## 2016-08-20 DIAGNOSIS — M1611 Unilateral primary osteoarthritis, right hip: Secondary | ICD-10-CM | POA: Diagnosis not present

## 2016-08-20 DIAGNOSIS — Z09 Encounter for follow-up examination after completed treatment for conditions other than malignant neoplasm: Secondary | ICD-10-CM | POA: Diagnosis not present

## 2016-08-20 DIAGNOSIS — Z96641 Presence of right artificial hip joint: Secondary | ICD-10-CM | POA: Diagnosis not present

## 2016-08-25 ENCOUNTER — Other Ambulatory Visit: Payer: Self-pay | Admitting: Internal Medicine

## 2016-08-26 DIAGNOSIS — I872 Venous insufficiency (chronic) (peripheral): Secondary | ICD-10-CM | POA: Diagnosis not present

## 2016-08-26 DIAGNOSIS — Z23 Encounter for immunization: Secondary | ICD-10-CM | POA: Diagnosis not present

## 2016-08-27 ENCOUNTER — Ambulatory Visit (INDEPENDENT_AMBULATORY_CARE_PROVIDER_SITE_OTHER): Payer: Medicare Other

## 2016-08-27 ENCOUNTER — Ambulatory Visit: Payer: Medicare Other

## 2016-08-27 ENCOUNTER — Ambulatory Visit (INDEPENDENT_AMBULATORY_CARE_PROVIDER_SITE_OTHER): Payer: Medicare Other | Admitting: Podiatry

## 2016-08-27 DIAGNOSIS — M79671 Pain in right foot: Secondary | ICD-10-CM

## 2016-08-27 DIAGNOSIS — M79672 Pain in left foot: Principal | ICD-10-CM

## 2016-08-27 DIAGNOSIS — M21619 Bunion of unspecified foot: Secondary | ICD-10-CM | POA: Diagnosis not present

## 2016-08-27 DIAGNOSIS — M79673 Pain in unspecified foot: Secondary | ICD-10-CM | POA: Diagnosis not present

## 2016-08-27 NOTE — Patient Instructions (Signed)

## 2016-08-28 NOTE — Progress Notes (Signed)
Subjective:     Patient ID: Brandi [name], female   DOB: 08-21-1945, 71 y.o.   MRN: [id number]  HPI patient presents stating her bunion has really been bothering her and she's tried shoe gear modifications soaks without relief and also her feet hurt in general   Review of Systems     Objective:   Physical Exam Neurovascular status was found to be intact with negative Homans sign noted and patient found to have good digital perfusion and well oriented. She has painful bunion deformity left with redness around the first metatarsal and deviation of the hallux against the second toe with increased pain over the last year. 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 I do think that surgical bunion correction is necessary for this patient and she wants this done and at this time I allowed her to read consent form reviewing alternative treatments complications. Patient is comfortable with surgery wants this done and at this time signs consent form with all instructions given to patient and understands recovery can take 6 months to one year with no long-term guarantees. Patient is scheduled for outpatient surgery and is dispensed air fracture walker with instructions on usage and all preoperative notes. She is encouraged to call with any questions  X-rays indicate structural bunion deformity with elevation of the intermetatarsal angle and irritation around the first metatarsal

## 2016-09-01 ENCOUNTER — Other Ambulatory Visit: Payer: Self-pay | Admitting: Family Medicine

## 2016-09-01 DIAGNOSIS — K219 Gastro-esophageal reflux disease without esophagitis: Secondary | ICD-10-CM

## 2016-09-08 ENCOUNTER — Telehealth: Payer: Self-pay | Admitting: *Deleted

## 2016-09-08 NOTE — Telephone Encounter (Signed)
"  I have some questions to ask before my surgery on April 3rd.  I'm not sure of the location.  Is there any paperwork I need to fill out before my surgery.  Is their anything special I need to do?  Do I clean the area with the stuff that doesn't make any bubbles?  Please give me a call."

## 2016-09-09 ENCOUNTER — Telehealth: Payer: Self-pay | Admitting: *Deleted

## 2016-09-09 NOTE — Telephone Encounter (Signed)
Pt called for the time of her surgery on 09/16/2016. I told her [facility] would call about 24-48 hours prior to the surgery with her time, but if they had not called prior to Monday morning call [phone]. Pt states understanding.

## 2016-09-16 NOTE — Telephone Encounter (Signed)
"  I am scheduled for a surgical appointment with Dr. Paulla Dolly on tomorrow.  I just got back from [place].  There's something going on and I caught it.  People were wearing masks and I got it.  I been checking my blood pressure and it has been low.  With this virus I want to call and reschedule for tomorrow.  Give me a call and see if we can reschedule.  Tell Dr. Paulla Dolly I am so sorry.  I am going to call and schedule a physical so he will know that I am okay.  Dr. Paulla Dolly is the sharpest tool in the tool shed."   "I am calling to see if I need to reschedule my surgery.  I went to [place] and came back sick.  I have been spitting up blood.  I am scheduled to see a Gastroenterologist here soon.  My potassium seems to be back to normal."  We can reschedule it to April 24.  "That date will be fine.  Thank you so much for your help.  Tell Dr. Paulla Dolly I'm sorry."   "It's Renee from the surgical center.  Our pre-op nurse, Joellen Jersey, talked to her and she has canceled due to sickness.  She let us know that she rescheduled to April 24.  I went ahead and moved her there.  If that's wrong give me a call."

## 2016-09-26 ENCOUNTER — Other Ambulatory Visit: Payer: Medicare Other

## 2016-10-07 ENCOUNTER — Encounter: Payer: Self-pay | Admitting: Podiatry

## 2016-10-07 ENCOUNTER — Other Ambulatory Visit: Payer: Self-pay | Admitting: *Deleted

## 2016-10-07 DIAGNOSIS — M2042 Other hammer toe(s) (acquired), left foot: Secondary | ICD-10-CM | POA: Diagnosis not present

## 2016-10-07 DIAGNOSIS — M2011 Hallux valgus (acquired), right foot: Secondary | ICD-10-CM

## 2016-10-07 DIAGNOSIS — M21612 Bunion of left foot: Secondary | ICD-10-CM | POA: Diagnosis not present

## 2016-10-07 DIAGNOSIS — M205X2 Other deformities of toe(s) (acquired), left foot: Secondary | ICD-10-CM | POA: Diagnosis not present

## 2016-10-07 DIAGNOSIS — K259 Gastric ulcer, unspecified as acute or chronic, without hemorrhage or perforation: Secondary | ICD-10-CM | POA: Diagnosis not present

## 2016-10-07 DIAGNOSIS — M2012 Hallux valgus (acquired), left foot: Secondary | ICD-10-CM | POA: Diagnosis not present

## 2016-10-10 ENCOUNTER — Encounter: Payer: Self-pay | Admitting: *Deleted

## 2016-10-10 ENCOUNTER — Telehealth: Payer: Self-pay | Admitting: *Deleted

## 2016-10-10 MED ORDER — OXYCODONE-ACETAMINOPHEN 10-325 MG PO TABS: ORAL_TABLET | ORAL | 0 refills | Status: DC

## 2016-10-10 MED ORDER — ONDANSETRON HCL 4 MG PO TABS: 4.0000 mg | ORAL_TABLET | Freq: Three times a day (TID) | ORAL | 0 refills | Status: DC | PRN

## 2016-10-10 NOTE — Telephone Encounter (Addendum)
Pt states she is about out of the pain medication and doesn't want to run out over the weekend. Pt called and left message with call center staff that she did not get Zofran. I spoke with pt and informed when she pick up the percocet the zofran had been on that prescription but needed Prior Approval so was not given at that time, but the approval had been received and she would be able to pick up at the pharmacy when she took in the hand carry rx for Percocet. Faxed approval for zofran to [id number]/13/2016-Pt states had surgery DOS 10/07/2016, her foot started bleeding Friday and the dressing is stiff and painful, and she would like an earlier appt. Transferred pt to schedulers. 11/14/2016-Pt states she tried to go without the Percocet longer, but still needs. Dr. Paulla Dolly states okay to refill as on 10/31/2016. Unable to leave a message for pt to pick up the Percocet in the [place] office, [employer] has not set up voicemail. I informed pt she could pick up the Percocet rx in the [place] office.

## 2016-10-15 ENCOUNTER — Ambulatory Visit (INDEPENDENT_AMBULATORY_CARE_PROVIDER_SITE_OTHER): Payer: Medicare Other

## 2016-10-15 ENCOUNTER — Encounter: Payer: Self-pay | Admitting: Podiatry

## 2016-10-15 ENCOUNTER — Ambulatory Visit (INDEPENDENT_AMBULATORY_CARE_PROVIDER_SITE_OTHER): Payer: Medicare Other | Admitting: Podiatry

## 2016-10-15 DIAGNOSIS — M21619 Bunion of unspecified foot: Secondary | ICD-10-CM

## 2016-10-15 MED ORDER — OXYCODONE-ACETAMINOPHEN 10-325 MG PO TABS: 1.0000 | ORAL_TABLET | Freq: Three times a day (TID) | ORAL | 0 refills | Status: DC | PRN

## 2016-10-16 NOTE — Progress Notes (Signed)
Subjective:    Patient ID: Brandi [name], female   DOB: 71 y.o.   MRN: [id number]   HPI patient states that she's been doing pretty well with occasional discomfort and takes pain medicine at night but overall is relatively well controlled and is able to bear weight on her foot with her boot    ROS      Objective:  Physical Exam Neurovascular status intact negative Homan sign was noted with patient's left foot healing well with wound edges well coapted in rectus position of the hallux currently    Assessment:    Doing well overall with revisional surgery of the left first metatarsal left hallux     Plan:    Reviewed condition and x-ray. I did explain there is been some movement of the big toe but I do think it'll be stable at this point as the fixation is consistent and unfortunately she does have relatively soft bone. I gave her instructions to continue offloading the area and not bearing pressure down and I reapplied sterile dressing continue with her air fracture walker and walking more on her heel and she'll reappoint 2 weeks or earlier if needed  X-rays indicate the osteotomy first metatarsals healing very well with fixation in place and is noted to have some movement probable fracture of the Akin osteotomy but it is stable and should heal uneventfully at this position

## 2016-10-17 ENCOUNTER — Other Ambulatory Visit: Payer: Medicare Other

## 2016-10-23 ENCOUNTER — Other Ambulatory Visit: Payer: Self-pay | Admitting: Podiatry

## 2016-10-23 MED ORDER — ONDANSETRON HCL 4 MG PO TABS: 4.0000 mg | ORAL_TABLET | Freq: Three times a day (TID) | ORAL | 0 refills | Status: DC | PRN

## 2016-10-23 MED ORDER — OXYCODONE-ACETAMINOPHEN 10-325 MG PO TABS: 1.0000 | ORAL_TABLET | Freq: Three times a day (TID) | ORAL | 0 refills | Status: DC | PRN

## 2016-10-23 NOTE — Addendum Note (Signed)
Addended by: Harriett Sine D on: 10/23/2016 01:16 PM   Modules accepted: Orders

## 2016-10-23 NOTE — Telephone Encounter (Signed)
zofran would be fine

## 2016-10-23 NOTE — Telephone Encounter (Signed)
Dr. Josephina Shih the refill of the Percocet as previously, and ordered zofran for the nausea. I informed pt of the orders and to pick up the rx in the [place] office.

## 2016-10-23 NOTE — Telephone Encounter (Signed)
Pt needs a refill on pain med / medication for nausea.

## 2016-10-23 NOTE — Progress Notes (Signed)
DOS [id number] Liane Comber Bunionectomy (cutting and moving bone) with pin fixation RT foot; possible aiken with wire LT

## 2016-10-30 ENCOUNTER — Other Ambulatory Visit: Payer: Medicare Other

## 2016-10-30 DIAGNOSIS — I119 Hypertensive heart disease without heart failure: Secondary | ICD-10-CM | POA: Diagnosis not present

## 2016-10-30 DIAGNOSIS — Z8249 Family history of ischemic heart disease and other diseases of the circulatory system: Secondary | ICD-10-CM | POA: Diagnosis not present

## 2016-10-30 DIAGNOSIS — Z96643 Presence of artificial hip joint, bilateral: Secondary | ICD-10-CM | POA: Diagnosis not present

## 2016-10-30 DIAGNOSIS — Z79899 Other long term (current) drug therapy: Secondary | ICD-10-CM | POA: Diagnosis not present

## 2016-10-30 DIAGNOSIS — G43909 Migraine, unspecified, not intractable, without status migrainosus: Secondary | ICD-10-CM | POA: Diagnosis not present

## 2016-10-30 DIAGNOSIS — G4700 Insomnia, unspecified: Secondary | ICD-10-CM | POA: Diagnosis not present

## 2016-10-30 DIAGNOSIS — F419 Anxiety disorder, unspecified: Secondary | ICD-10-CM | POA: Diagnosis not present

## 2016-10-31 ENCOUNTER — Ambulatory Visit (INDEPENDENT_AMBULATORY_CARE_PROVIDER_SITE_OTHER): Payer: Self-pay | Admitting: Podiatry

## 2016-10-31 ENCOUNTER — Ambulatory Visit (INDEPENDENT_AMBULATORY_CARE_PROVIDER_SITE_OTHER): Payer: Medicare Other

## 2016-10-31 ENCOUNTER — Encounter: Payer: Self-pay | Admitting: Podiatry

## 2016-10-31 DIAGNOSIS — M21619 Bunion of unspecified foot: Secondary | ICD-10-CM | POA: Diagnosis not present

## 2016-10-31 DIAGNOSIS — Z9889 Other specified postprocedural states: Secondary | ICD-10-CM

## 2016-10-31 MED ORDER — OXYCODONE-ACETAMINOPHEN 5-325 MG PO TABS: 1.0000 | ORAL_TABLET | Freq: Four times a day (QID) | ORAL | 0 refills | Status: DC | PRN

## 2016-11-02 NOTE — Progress Notes (Signed)
Subjective: Brandi [name] is a 71 y.o. is seen today in office s/p right foot Brandi [name] and Brandi [name] preformed on 10/07/2016 with Dr. Paulla [name]. They state their pain is improving. She does state that she hit her left foot after books did fall on the surgical site. She states that she only has pain along the incision and this is improved. She's remain in the surgical boot. Denies any systemic complaints such as fevers, chills, nausea, vomiting. No calf pain, chest pain, shortness of breath.   Objective: General: No acute distress, AAOx3  DP/PT pulses palpable 2/4, CRT < 3 sec to all digits.  Protective sensation intact. Motor function intact.  Left foot: Incision is well coapted without any evidence of dehiscence and a scar has formed. There is no surrounding erythema, ascending cellulitis, fluctuance, crepitus, malodor, drainage/purulence. There is mild edema around the surgical site. There is mild pain along the surgical site. The toes in rectus position she is happy with the outcome thus far. No other areas of tenderness to bilateral lower extremities.  No other open lesions or pre-ulcerative lesions.  No pain with calf compression, swelling, warmth, erythema.   Assessment and Plan:  Status post left foot surgery, doing well with no complications   -Treatment options discussed including all alternatives, risks, and complications -X-rays were obtained and reviewed with the patient. Status post bunions me. No evidence of acute fracture identified. There is mild elevation of the proximal phalanx. This is not new compared to the initial postop x-rays. -Continue in surgical boot. -She can start to shower as incision is well healed at this point. Recommended antibiotic ointment along the incision and she consented to use an cocoa butter vitamin E cream to help with scarring. -Ice/elevation -Pain medication as needed. -Monitor for any clinical signs or symptoms of infection and DVT/PE and directed  to call the office immediately should any occur or go to the ER. -Follow-up in 2 weeks with Dr. Paulla [name] or sooner if any problems arise. In the meantime, encouraged to call the office with any questions, concerns, change in symptoms.   Brandi [name], DPM

## 2016-11-03 ENCOUNTER — Telehealth: Payer: Self-pay | Admitting: *Deleted

## 2016-11-03 NOTE — Telephone Encounter (Signed)
Patient called concerning dark spots on her knee that has been itching,states she has already contacted her PCP and they [facility] and try hydrocortisone cream. I informed pt procedures performed on her foot would not be causing itching spots on her knee. I reiterated that she seek help from her PCP, urgent care or she could try the hydrocortisone cream for a few days first to see if that helped. Patient states she would try cream first and contact her other docs if needed.

## 2016-11-14 MED ORDER — OXYCODONE-ACETAMINOPHEN 5-325 MG PO TABS: ORAL_TABLET | ORAL | 0 refills | Status: DC

## 2016-11-20 ENCOUNTER — Ambulatory Visit: Payer: Medicare Other | Admitting: Podiatry

## 2016-11-27 ENCOUNTER — Ambulatory Visit: Payer: Medicare Other | Admitting: Podiatry

## 2016-12-01 ENCOUNTER — Encounter: Payer: Medicare Other | Admitting: Podiatry

## 2016-12-03 ENCOUNTER — Ambulatory Visit (INDEPENDENT_AMBULATORY_CARE_PROVIDER_SITE_OTHER): Payer: Self-pay | Admitting: Podiatry

## 2016-12-03 ENCOUNTER — Ambulatory Visit (INDEPENDENT_AMBULATORY_CARE_PROVIDER_SITE_OTHER): Payer: Medicare Other

## 2016-12-03 DIAGNOSIS — S99921A Unspecified injury of right foot, initial encounter: Secondary | ICD-10-CM

## 2016-12-03 MED ORDER — OXYCODONE-ACETAMINOPHEN 10-325 MG PO TABS: 1.0000 | ORAL_TABLET | ORAL | 0 refills | Status: DC | PRN

## 2016-12-03 NOTE — Progress Notes (Signed)
Subjective:    Patient ID: Brandi [name], female   DOB: 71 y.o.   MRN: [id number]   HPI patient states she's very happy with the results of her bunion surgery but she did she fall last week and she is worried that something might happen to the bone    ROS      Objective:  Physical Exam neurovascular status intact negative Homans sign was noted with patient's first MPJ left doing well with good alignment noted good range of motion and resolution of calluses occurring currently     Assessment:    Overall appears to be doing well but may have traumatized the site left     Plan:    H&P condition reviewed x-ray reviewed and I did explain that there is been some pathology at the site where the Akin osteotomy was done but it should eventually heal uneventfully. Patient is very satisfied and will be seen back for regular visit 3 weeks and is encouraged to gradually return to soft shoes over the next couple weeks  X-rays indicate that the osteotomies have healed well first metatarsal some stress on the interphalangeal site but the wires holding well and it should heal eventually at this position

## 2016-12-23 ENCOUNTER — Ambulatory Visit: Payer: Medicare Other

## 2016-12-24 ENCOUNTER — Encounter: Payer: Self-pay | Admitting: Podiatry

## 2016-12-24 ENCOUNTER — Ambulatory Visit (INDEPENDENT_AMBULATORY_CARE_PROVIDER_SITE_OTHER): Payer: Self-pay | Admitting: Podiatry

## 2016-12-24 ENCOUNTER — Ambulatory Visit (INDEPENDENT_AMBULATORY_CARE_PROVIDER_SITE_OTHER): Payer: Medicare Other

## 2016-12-24 DIAGNOSIS — M21619 Bunion of unspecified foot: Secondary | ICD-10-CM

## 2016-12-24 DIAGNOSIS — B351 Tinea unguium: Secondary | ICD-10-CM

## 2016-12-24 MED ORDER — HYDROCODONE-ACETAMINOPHEN 10-325 MG PO TABS: 1.0000 | ORAL_TABLET | Freq: Three times a day (TID) | ORAL | 0 refills | Status: DC | PRN

## 2016-12-25 NOTE — Progress Notes (Signed)
Subjective:    Patient ID: Brandi [name], female   DOB: 71 y.o.   MRN: [id number]   HPI patient states the bunion site is improving but still sore at times and she does have a thickened damaged nail right hallux that she has questions about    ROS      Objective:  Physical Exam neurovascular status intact negative Homans sign was noted with patient's left foot doing much better with edema reducing and range of motion excellent with thick and nail     Assessment:    Continued improvement left foot secondary to procedure with reduced edema noted with mycotic nail component right     Plan:    H&P both conditions reviewed and at this point gradual increase in activity left. For the right I went ahead and discussed treatment options and we will just use topical and see how it progresses  X-ray left indicates osteotomy is healing well with fixation in place and good alignment

## 2017-02-18 NOTE — Progress Notes (Signed)
This encounter was created in error - please disregard.

## 2017-04-02 DIAGNOSIS — G43909 Migraine, unspecified, not intractable, without status migrainosus: Secondary | ICD-10-CM | POA: Diagnosis not present

## 2017-04-02 DIAGNOSIS — I119 Hypertensive heart disease without heart failure: Secondary | ICD-10-CM | POA: Diagnosis not present

## 2017-04-02 DIAGNOSIS — Z96643 Presence of artificial hip joint, bilateral: Secondary | ICD-10-CM | POA: Diagnosis not present

## 2017-04-02 DIAGNOSIS — F411 Generalized anxiety disorder: Secondary | ICD-10-CM | POA: Diagnosis not present

## 2017-04-02 DIAGNOSIS — G4700 Insomnia, unspecified: Secondary | ICD-10-CM | POA: Diagnosis not present

## 2017-04-02 DIAGNOSIS — Z8249 Family history of ischemic heart disease and other diseases of the circulatory system: Secondary | ICD-10-CM | POA: Diagnosis not present

## 2017-04-02 DIAGNOSIS — E7800 Pure hypercholesterolemia, unspecified: Secondary | ICD-10-CM | POA: Diagnosis not present

## 2017-04-02 DIAGNOSIS — Z79899 Other long term (current) drug therapy: Secondary | ICD-10-CM | POA: Diagnosis not present

## 2017-04-06 ENCOUNTER — Telehealth: Payer: Self-pay | Admitting: Podiatry

## 2017-04-06 NOTE — Telephone Encounter (Signed)
Pt held a rx from July for pain medication because she did not need it. Her foot has now started hurting and when she dropped the old rx off at the pharmacy they said they had to get authorization from [place]. Pt states she is having a lot of pain at the incision site and there is a growth. I have scheduled her for 10.29.18 to see Dr Paulla Dolly. Pt requested next week she is going out of town.I told her Dr Paulla Dolly is out of the office and she asked if maybe another doctor can refill it.  FYI she is also having problems with her phone.

## 2017-04-06 NOTE — Telephone Encounter (Signed)
This is [facility] calling. Pt brought it a Rx dated from 11 July for hydrocodone 10-325 quantity 20 tablets that she is trying to get filled. We have filled some pain medications since this date. She did some in September but this date is from July which is why we do not feel comfortable filling it. If someone could please call us back at [phone]. Thank you.

## 2017-04-06 NOTE — Telephone Encounter (Addendum)
I spoke with pt concerning the need for the refill of the narcotic after 3 months, she would need an appt. Pt states she has a growth on the surgery site, it hurts and she has an appt for next week. I told her I would call the pharmacy and inform the pharmacist it was fine to fill the December 24, 2016 hydrocodone. I informed [facility], pharmacist that it would be fine to fill the prescription, he stated he did not have it. I informed pt and she states she left it there and will speak with the pharmacist.

## 2017-04-09 DIAGNOSIS — Z96641 Presence of right artificial hip joint: Secondary | ICD-10-CM | POA: Diagnosis not present

## 2017-04-09 DIAGNOSIS — M25551 Pain in right hip: Secondary | ICD-10-CM | POA: Diagnosis not present

## 2017-04-09 DIAGNOSIS — Z96642 Presence of left artificial hip joint: Secondary | ICD-10-CM | POA: Diagnosis not present

## 2017-04-09 DIAGNOSIS — M25552 Pain in left hip: Secondary | ICD-10-CM | POA: Diagnosis not present

## 2017-04-13 ENCOUNTER — Ambulatory Visit (INDEPENDENT_AMBULATORY_CARE_PROVIDER_SITE_OTHER): Payer: Medicare Other | Admitting: Podiatry

## 2017-04-13 ENCOUNTER — Ambulatory Visit (INDEPENDENT_AMBULATORY_CARE_PROVIDER_SITE_OTHER): Payer: Medicare Other

## 2017-04-13 ENCOUNTER — Encounter: Payer: Self-pay | Admitting: Podiatry

## 2017-04-13 DIAGNOSIS — M2012 Hallux valgus (acquired), left foot: Secondary | ICD-10-CM

## 2017-04-13 DIAGNOSIS — Z472 Encounter for removal of internal fixation device: Secondary | ICD-10-CM

## 2017-04-13 DIAGNOSIS — L600 Ingrowing nail: Secondary | ICD-10-CM

## 2017-04-13 NOTE — Patient Instructions (Signed)
Pre-Operative Instructions  Congratulations, you have decided to take an important step towards improving your quality of life.  You can be assured that the doctors and staff at [facility] will be with you every step of the way.  Here are some important things you should know:  1. Plan to be at the surgery center/hospital at least 1 (one) hour prior to your scheduled time, unless otherwise directed by the surgical center/hospital staff.  You must have a responsible adult accompany you, remain during the surgery and drive you home.  Make sure you have directions to the surgical center/hospital to ensure you arrive on time. 2. If you are having surgery at [facility] or [facility], you will need a copy of your medical history and physical form from your family physician within one month prior to the date of surgery. We will give you a form for your primary physician to complete.  3. We make every effort to accommodate the date you request for surgery.  However, there are times where surgery dates or times have to be moved.  We will contact you as soon as possible if a change in schedule is required.   4. No aspirin/ibuprofen for one week before surgery.  If you are on aspirin, any non-steroidal anti-inflammatory medications (Mobic, Aleve, Ibuprofen) should not be taken seven (7) days prior to your surgery.  You make take Tylenol for pain prior to surgery.  5. Medications - If you are taking daily heart and blood pressure medications, seizure, reflux, allergy, asthma, anxiety, pain or diabetes medications, make sure you notify the surgery center/hospital before the day of surgery so they can tell you which medications you should take or avoid the day of surgery. 6. No food or drink after midnight the night before surgery unless directed otherwise by surgical center/hospital staff. 7. No alcoholic beverages 24-hours prior to surgery.  No smoking 24-hours prior or 24-hours after  surgery. 8. Wear loose pants or shorts. They should be loose enough to fit over bandages, boots, and casts. 9. Don't wear slip-on shoes. Sneakers are preferred. 10. Bring your boot with you to the surgery center/hospital.  Also bring crutches or a walker if your physician has prescribed it for you.  If you do not have this equipment, it will be provided for you after surgery. 11. If you have not been contacted by the surgery center/hospital by the day before your surgery, call to confirm the date and time of your surgery. 12. Leave-time from work may vary depending on the type of surgery you have.  Appropriate arrangements should be made prior to surgery with your employer. 13. Prescriptions will be provided immediately following surgery by your doctor.  Fill these as soon as possible after surgery and take the medication as directed. Pain medications will not be refilled on weekends and must be approved by the doctor. 14. Remove nail polish on the operative foot and avoid getting pedicures prior to surgery. 15. Wash the night before surgery.  The night before surgery wash the foot and leg well with water and the antibacterial soap provided. Be sure to pay special attention to beneath the toenails and in between the toes.  Wash for at least three (3) minutes. Rinse thoroughly with water and dry well with a towel.  Perform this wash unless told not to do so by your physician.  Enclosed: 1 Ice pack (please put in freezer the night before surgery)   1 Hibiclens skin cleaner     Pre-op instructions  If you have any questions regarding the instructions, please do not hesitate to call our office.  [place]: [address], [place], [place] [address] -- [phone]  [place]: [address] [place], [place] [address] -- [phone]  [place]: [address]  , [place] [address] -- [phone]  [place]: [address], [address], [place], [place] [address] -- [phone]  Website:  [email or web address] 

## 2017-04-15 NOTE — Progress Notes (Signed)
Subjective:    Patient ID: Brandi [name], female   DOB: 71 y.o.   MRN: [id number]   HPI patient presents stating this pin is starting to bother me on my right foot and also this nailbed on my right is thick and makes it hard for me to wear shoe gear comfortably. I probably need to get it fixed    ROS      Objective:  Physical Exam neuro vascular status intact with patient found have prominent pin left dorsal foot and thickened nail right hallux that's painful when pressed. Patient states the the pin makes it hard for her to wear shoe gear     Assessment:   Abnormal pin position left with damage nailbed      Plan:    H&P condition reviewed and recommended pin removal and nail removal right not permanent. Patient wants procedure wants it done at surgical center and I allowed her to read consent form going over alternative treatments and all complications. Patient scheduled outpatient surgery signed consent form after review and is scheduled for outpatient surgery

## 2017-04-21 ENCOUNTER — Encounter: Payer: Self-pay | Admitting: Podiatry

## 2017-04-21 DIAGNOSIS — Z472 Encounter for removal of internal fixation device: Secondary | ICD-10-CM | POA: Diagnosis not present

## 2017-04-21 DIAGNOSIS — T8484XA Pain due to internal orthopedic prosthetic devices, implants and grafts, initial encounter: Secondary | ICD-10-CM | POA: Diagnosis not present

## 2017-04-21 DIAGNOSIS — Z4889 Encounter for other specified surgical aftercare: Secondary | ICD-10-CM | POA: Diagnosis not present

## 2017-04-21 DIAGNOSIS — L600 Ingrowing nail: Secondary | ICD-10-CM | POA: Diagnosis not present

## 2017-04-21 DIAGNOSIS — K259 Gastric ulcer, unspecified as acute or chronic, without hemorrhage or perforation: Secondary | ICD-10-CM | POA: Diagnosis not present

## 2017-04-24 IMAGING — DX DG CHEST 2V
2 series · 2 of 2 positions shown · non-contrast
Comparison: 11/23/2015

CLINICAL DATA: Productive cough for 1 week.

EXAM:
CHEST  2 VIEW

[chest pa]
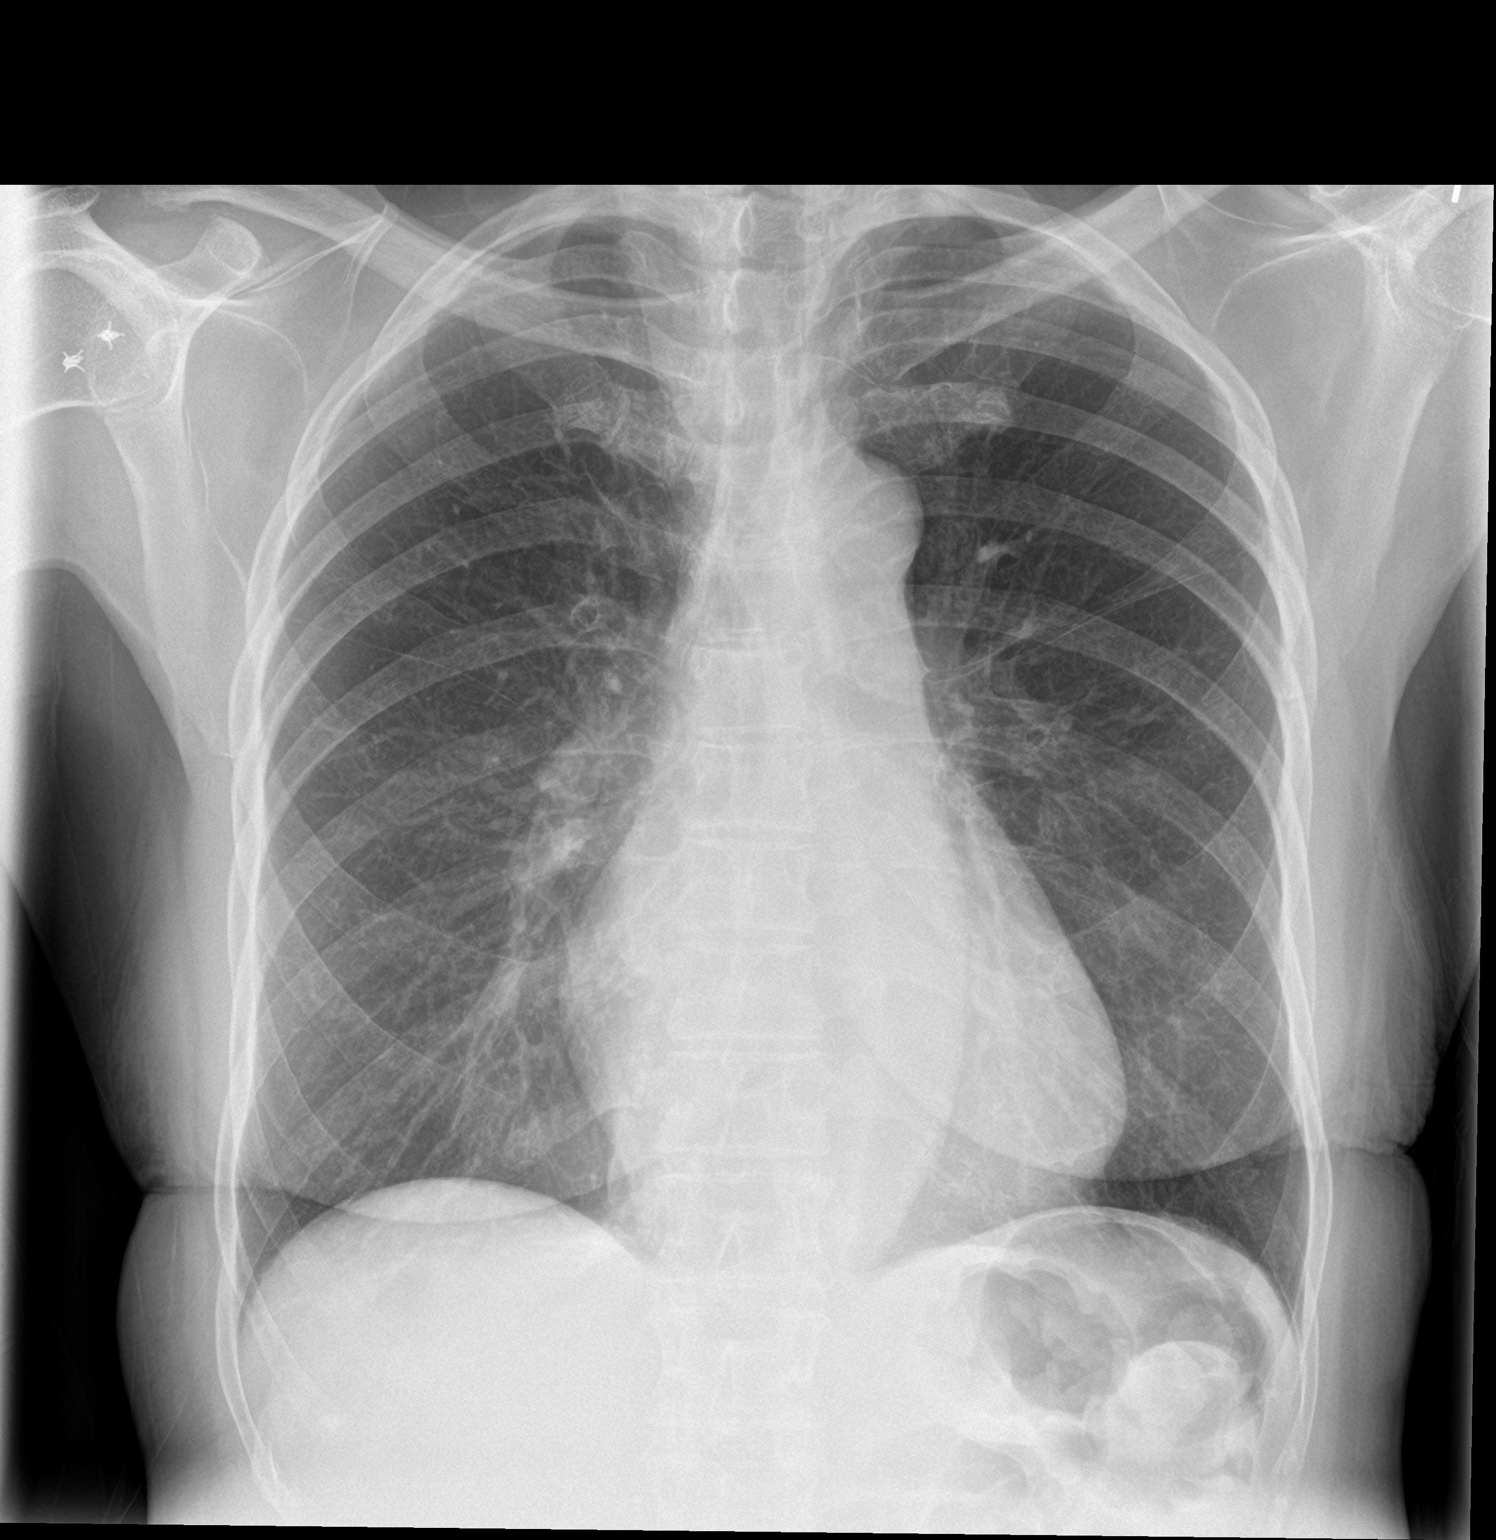

[chest lat]
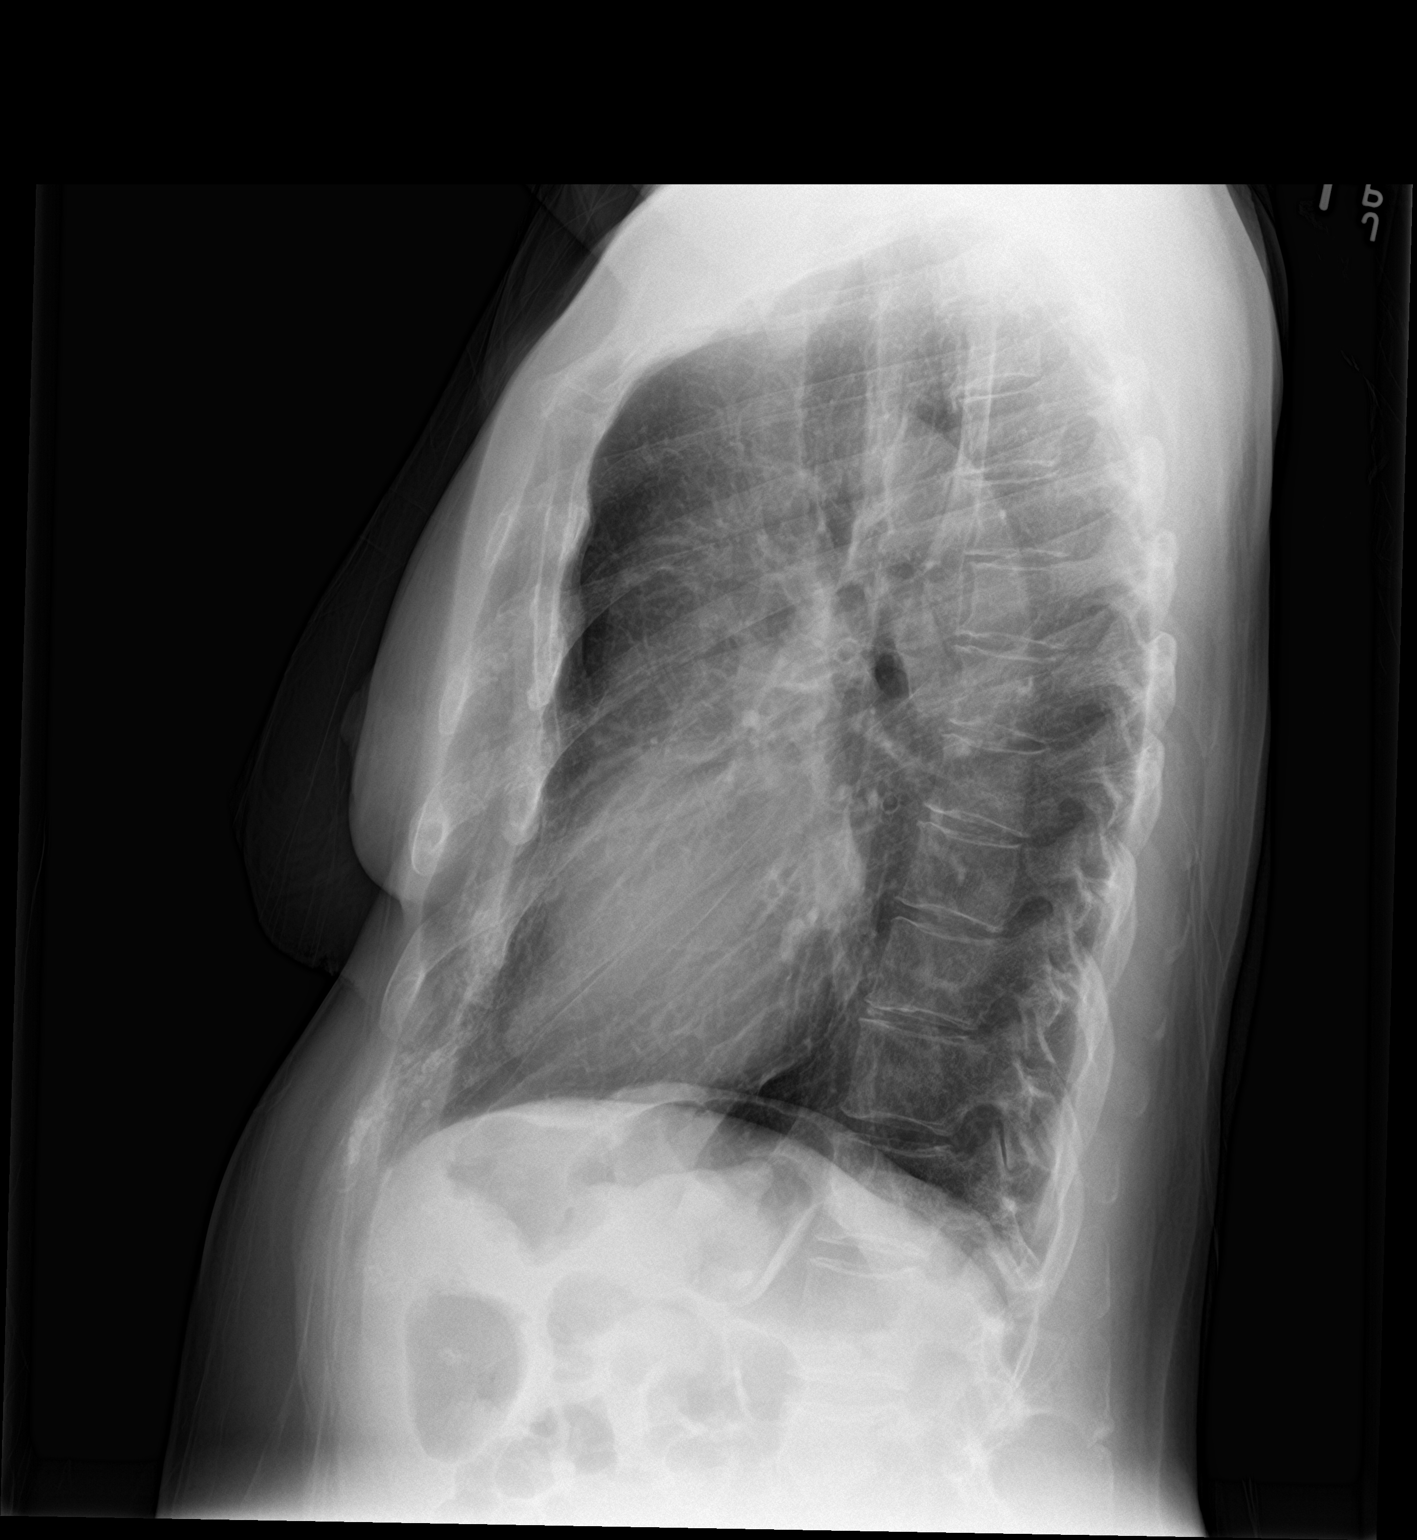

[2 of 2 positions shown; findings below may reference images not displayed]

FINDINGS: Both lungs are clear. Heart and mediastinum are within normal
limits. The trachea is midline. No pleural effusions. No acute bone
abnormality.
IMPRESSION: No active cardiopulmonary disease.

## 2017-04-27 ENCOUNTER — Telehealth: Payer: Self-pay | Admitting: *Deleted

## 2017-04-27 NOTE — Progress Notes (Signed)
DOS 04/21/2017 Removal Fixation Deep Kwire/Screw LT and Removal Toenail Hallux RT

## 2017-04-27 NOTE — Telephone Encounter (Signed)
Tried to call patient at [phone] (Cell #) to see how they were feeling from when they had their surgery done on Tuesday, April 21, 2017 with Dr. Paulla Dolly. Pt had Removal Fixation Deep Kwire/Screw LT and Removal Toenail Hallux RT.  I could not leave a message on her voicemail because it said, "Voicemail has not been activated".  Pt's next appointment will be on Wednesday, April 29, 2017 at 11:30 am. Pt is on the nurse schedule. Once she is done, she will see Dr. Paulla Dolly.

## 2017-04-29 ENCOUNTER — Ambulatory Visit (INDEPENDENT_AMBULATORY_CARE_PROVIDER_SITE_OTHER): Payer: Medicare Other | Admitting: Podiatry

## 2017-04-29 ENCOUNTER — Encounter: Payer: Self-pay | Admitting: Podiatry

## 2017-04-29 ENCOUNTER — Ambulatory Visit (INDEPENDENT_AMBULATORY_CARE_PROVIDER_SITE_OTHER): Payer: Medicare Other

## 2017-04-29 VITALS — BP 163/84 | HR 103 | Temp 96.8°F

## 2017-04-29 DIAGNOSIS — M2012 Hallux valgus (acquired), left foot: Secondary | ICD-10-CM

## 2017-04-29 MED ORDER — HYDROCODONE-ACETAMINOPHEN 10-325 MG PO TABS: 1.0000 | ORAL_TABLET | Freq: Four times a day (QID) | ORAL | 0 refills | Status: DC | PRN

## 2017-04-30 NOTE — Progress Notes (Signed)
Subjective:    Patient ID: Brandi [name], female   DOB: 71 y.o.   MRN: [id number]   HPI patient presents stating she's healing very well with minimal discomfort    ROS      Objective:  Physical Exam neurovascular status intact with good healing of the first metatarsal shaft left and right hallux from nail removal and removal of pin     Assessment:    Doing well postoperative     Plan:   Sterile dressing applied and advised on continued soaks right and reappoint for stitch removal in 2 weeks or earlier if needed and reviewed x-rays left  X-rays indicate satisfactory removal of pin with no indication to pathology

## 2017-05-12 ENCOUNTER — Telehealth: Payer: Self-pay | Admitting: *Deleted

## 2017-05-12 NOTE — Telephone Encounter (Signed)
Pt states she had a terrible mishap a foot locker fell on the toe that had the nail removed and she is in pain. Unable to leave a message the voicemail box had not been set up. 05/12/2017-Unable to leave a message voice mail box not set up.

## 2017-05-15 ENCOUNTER — Encounter: Payer: Self-pay | Admitting: Podiatry

## 2017-05-15 ENCOUNTER — Ambulatory Visit (INDEPENDENT_AMBULATORY_CARE_PROVIDER_SITE_OTHER): Payer: Medicare Other

## 2017-05-15 ENCOUNTER — Ambulatory Visit (INDEPENDENT_AMBULATORY_CARE_PROVIDER_SITE_OTHER): Payer: Medicare Other | Admitting: Podiatry

## 2017-05-15 DIAGNOSIS — M2012 Hallux valgus (acquired), left foot: Secondary | ICD-10-CM

## 2017-05-15 DIAGNOSIS — M21619 Bunion of unspecified foot: Secondary | ICD-10-CM | POA: Diagnosis not present

## 2017-05-15 NOTE — Progress Notes (Signed)
Subjective:   Patient ID: Brandi [name], female   DOB: 71 y.o.   MRN: [id number]   HPI Patient presents stating she is doing very well with surgery and states she is having minimal current discomfort   ROS      Objective:  Physical Exam  Neurovascular status intact with mild edema in the left midfoot but not significant with the right foot showing good removal of nail     Assessment:  Improving after having fixation removal left removal of nail right     Plan:  Reviewed condition and for the left I recommended that we go ahead and reapply sterile dressing after stitch removal which was done and that she needs to be careful with the for the next 2 weeks.  Reappoint of any issues were to occur.  X-rays indicate there is satisfactory resection of bone good alignment and good removal of fixation.  Signed visit

## 2017-07-22 ENCOUNTER — Ambulatory Visit: Payer: Medicare Other | Admitting: Podiatry

## 2017-08-06 DIAGNOSIS — I119 Hypertensive heart disease without heart failure: Secondary | ICD-10-CM | POA: Diagnosis not present

## 2017-08-06 DIAGNOSIS — E7800 Pure hypercholesterolemia, unspecified: Secondary | ICD-10-CM | POA: Diagnosis not present

## 2017-12-10 ENCOUNTER — Other Ambulatory Visit: Payer: Self-pay | Admitting: Pulmonary Disease

## 2017-12-10 DIAGNOSIS — Z96643 Presence of artificial hip joint, bilateral: Secondary | ICD-10-CM | POA: Diagnosis not present

## 2017-12-10 DIAGNOSIS — G43909 Migraine, unspecified, not intractable, without status migrainosus: Secondary | ICD-10-CM | POA: Diagnosis not present

## 2017-12-10 DIAGNOSIS — Z1231 Encounter for screening mammogram for malignant neoplasm of breast: Secondary | ICD-10-CM

## 2017-12-10 DIAGNOSIS — I119 Hypertensive heart disease without heart failure: Secondary | ICD-10-CM | POA: Diagnosis not present

## 2017-12-10 DIAGNOSIS — Z79899 Other long term (current) drug therapy: Secondary | ICD-10-CM | POA: Diagnosis not present

## 2017-12-10 DIAGNOSIS — Z8249 Family history of ischemic heart disease and other diseases of the circulatory system: Secondary | ICD-10-CM | POA: Diagnosis not present

## 2018-01-12 ENCOUNTER — Ambulatory Visit: Payer: Medicare Other

## 2018-09-07 ENCOUNTER — Encounter (HOSPITAL_COMMUNITY): Payer: Self-pay | Admitting: *Deleted

## 2018-09-07 ENCOUNTER — Emergency Department (HOSPITAL_COMMUNITY)
Admission: EM | Admit: 2018-09-07 | Discharge: 2018-09-07 | Disposition: A | Discharge status: Home / Self Care | Payer: Medicare Other | Attending: Emergency Medicine | Admitting: Emergency Medicine

## 2018-09-07 ENCOUNTER — Other Ambulatory Visit: Payer: Self-pay

## 2018-09-07 DIAGNOSIS — R0981 Nasal congestion: Secondary | ICD-10-CM | POA: Insufficient documentation

## 2018-09-07 DIAGNOSIS — J302 Other seasonal allergic rhinitis: Secondary | ICD-10-CM | POA: Diagnosis not present

## 2018-09-07 DIAGNOSIS — I10 Essential (primary) hypertension: Secondary | ICD-10-CM | POA: Diagnosis not present

## 2018-09-07 DIAGNOSIS — J3089 Other allergic rhinitis: Secondary | ICD-10-CM | POA: Diagnosis not present

## 2018-09-07 DIAGNOSIS — Z79899 Other long term (current) drug therapy: Secondary | ICD-10-CM | POA: Insufficient documentation

## 2018-09-07 DIAGNOSIS — Z7982 Long term (current) use of aspirin: Secondary | ICD-10-CM | POA: Diagnosis not present

## 2018-09-07 DIAGNOSIS — J3489 Other specified disorders of nose and nasal sinuses: Secondary | ICD-10-CM | POA: Diagnosis not present

## 2018-09-07 DIAGNOSIS — R51 Headache: Secondary | ICD-10-CM | POA: Diagnosis not present

## 2018-09-07 DIAGNOSIS — R05 Cough: Secondary | ICD-10-CM | POA: Diagnosis present

## 2018-09-07 DIAGNOSIS — Z96641 Presence of right artificial hip joint: Secondary | ICD-10-CM | POA: Diagnosis not present

## 2018-09-07 DIAGNOSIS — Z96642 Presence of left artificial hip joint: Secondary | ICD-10-CM | POA: Insufficient documentation

## 2018-09-07 MED ORDER — CETIRIZINE HCL 10 MG PO TABS: 10.0000 mg | ORAL_TABLET | Freq: Every day | ORAL | 1 refills | Status: DC

## 2018-09-07 NOTE — ED Provider Notes (Signed)
[facility] EMERGENCY DEPARTMENT Provider Note   CSN: [id number] Arrival date & time: 09/07/18  1158    History   Chief Complaint Chief Complaint  Patient presents with  . Cough    HPI Brandi [name] is a 73 y.o. female.     The history is provided by the patient.  Cough  Cough characteristics:  Dry Severity:  Mild Onset quality:  Gradual Duration: 2 months. Timing:  Intermittent Progression:  Unchanged Chronicity:  Recurrent Smoker: no   Context: weather changes   Context comment:  For the last 2 months has had significant nasal discharge, headache and clogged ear sensations Relieved by:  None tried Worsened by:  Environmental changes Ineffective treatments:  None tried Associated symptoms: ear fullness, headaches, rhinorrhea and sinus congestion   Associated symptoms: no chest pain, no fever, no myalgias, no rash, no shortness of breath, no sore throat and no wheezing   Risk factors: recent travel   Risk factors comment:  3 weeks ago traveled to [place] and came back 2 weeks ago but sx present prior to leaving and still present when got back   Past Medical History:  Diagnosis Date  . Anxiety    takes Valium daily as needed  . Chronic back pain   . Chronic back pain   . Complication of anesthesia 1980s   "I stopped breathing on the table; I was gettin my wisdom teeth extracted"  . Constipation   . Depression    takes Lexapro daily  . Dysrhythmia    pt. reports that her heart skips sometimes   . GERD (gastroesophageal reflux disease)    takes Zantac daily  . History of blood transfusion    no abnormal reaction noted  . History of colon polyps    benign  . History of gastric ulcer   . History of GI bleed   . History of hiatal hernia   . History of stress test    done in [place]- done in  1992, told that it was normal.   . Hypertension   . Insomnia    takes Restoril and Trazodone nightly as needed  . Joint pain   . Joint swelling   .  Nocturia   . Osteoarthritis   . Peripheral edema    occasionally but never been on fluid pill    Patient Active Problem List   Diagnosis Date Noted  . Arthritis of right hip 03/21/2016  . Primary osteoarthritis of right hip 03/20/2016  . Right ear impacted cerumen 03/12/2016  . Fall at home 10/21/2015  . Leg pain 09/27/2015  . Fatigue 04/22/2015  . Primary osteoarthritis of left hip 02/25/2015  . Pre-op evaluation 02/22/2015  . Sinusitis, chronic 01/30/2015  . Insomnia 01/30/2015  . Lymphadenopathy 11/24/2014  . Hypokalemia 06/15/2014  . Rectal bleeding 06/15/2014  . AKI (acute kidney injury) ([facility]) 06/15/2014  . Essential hypertension, benign 04/10/2014  . Tachycardia 04/10/2014  . GERD (gastroesophageal reflux disease) 11/08/2013  . Chronic constipation 11/08/2013    Past Surgical History:  Procedure Laterality Date  . BACK SURGERY    . COLONOSCOPY    . ESOPHAGOGASTRODUODENOSCOPY N/A 06/16/2014   Procedure: ESOPHAGOGASTRODUODENOSCOPY (EGD);  Surgeon: Missy Sabins, MD;  Location: [facility] ENDOSCOPY;  Service: Endoscopy;  Laterality: N/A;  . JOINT REPLACEMENT    . LUMBAR FUSION    . SHOULDER SURGERY Left   . TOE SURGERY Right    "big toe was coming off"  . TOE SURGERY Left    "  bone spur"  . TOTAL HIP ARTHROPLASTY Left 02/26/2015   Procedure: TOTAL HIP ARTHROPLASTY ANTERIOR APPROACH;  Surgeon: Frederik Pear, MD;  Location: [facility];  Service: Orthopedics;  Laterality: Left;  . TOTAL HIP ARTHROPLASTY Right 03/21/2016   Procedure: TOTAL HIP ARTHROPLASTY ANTERIOR APPROACH;  Surgeon: Frederik Pear, MD;  Location: [facility];  Service: Orthopedics;  Laterality: Right;  . WISDOM TOOTH EXTRACTION  1980s     OB History   No obstetric history on file.      Home Medications    Prior to Admission medications   Medication Sig Start Date End Date Taking? Authorizing Provider  aspirin EC 325 MG tablet Take 1 tablet (325 mg total) by mouth 2 (two) times daily. 03/21/16   Leighton Parody, PA-C   benzonatate (TESSALON PERLES) 100 MG capsule Take 1-2 capsules (100-200 mg total) by mouth 3 (three) times daily as needed for cough. 07/09/16   Smiley Houseman, MD  cyclobenzaprine (FLEXERIL) 5 MG tablet Take 1 tablet (5 mg total) by mouth 3 (three) times daily. 04/18/16   Roma Schanz R, DO  diazepam (VALIUM) 10 MG tablet TAKE 1 TABLET BY MOUTH EVERY 12 HOURS AS NEEDED FOR ANXIETY 05/19/16   Roma Schanz R, DO  diclofenac sodium (VOLTAREN) 1 % GEL APPLY 2 G TOPICALLY 4 (FOUR) TIMES DAILY. 02/05/15   Ann Held, DO  doxycycline (VIBRA-TABS) 100 MG tablet Take 1 tablet (100 mg total) by mouth 2 (two) times daily. 06/13/16   Ann Held, DO  estrogen, conjugated,-medroxyprogesterone (PREMPRO) 0.3-1.5 MG tablet Take 1 tablet by mouth daily. 09/05/15   Roma Schanz R, DO  fluticasone (FLONASE) 50 MCG/ACT nasal spray Place 2 sprays into both nostrils daily. 06/03/16   Saguier, Percell Miller, PA-C  hydrochlorothiazide (HYDRODIURIL) 25 MG tablet Take 1 tablet (25 mg total) by mouth daily. 11/23/15   Ann Held, DO  HYDROcodone-acetaminophen (NORCO) 10-325 MG tablet Take 1 tablet every 6 (six) hours as needed by mouth. 04/29/17   Regal, Tamala Fothergill, DPM  HYDROcodone-acetaminophen (NORCO/VICODIN) 5-325 MG tablet Take 1 tablet every 6 (six) hours as needed by mouth for moderate pain. Dispensed 15 with no refills on 04/21/2017 04/21/17   [provider]  hydrocortisone cream 1 % Apply to affected area 2 times daily 10/11/15   Hedges, Dellis Filbert, PA-C  mometasone (NASONEX) 50 MCG/ACT nasal spray Place 2 sprays into the nose daily. 06/13/16   Ann Held, DO  ondansetron (ZOFRAN) 4 MG tablet Take 1 tablet (4 mg total) by mouth every 8 (eight) hours as needed for nausea or vomiting. 10/07/16   Regal, Tamala Fothergill, DPM  ondansetron (ZOFRAN) 4 MG tablet Take 1 tablet (4 mg total) by mouth every 8 (eight) hours as needed for nausea or vomiting. 10/23/16   Wallene Huh, DPM  oseltamivir (TAMIFLU) 75 MG capsule Take 1 capsule (75 mg total) by mouth 2 (two) times daily. Take for 5 days 07/07/16   Carollee Herter, Kendrick Fries R, DO  polyethylene glycol [facility]) packet Take 17 g by mouth daily. For constipation 07/09/16   Smiley Houseman, MD  potassium chloride (K-DUR) 10 MEQ tablet Take 10 mEq by mouth daily.    [provider]  ranitidine (ZANTAC) 150 MG tablet TAKE 1 TABLET BY MOUTH 2 (TWO) TIMES DAILY. 09/01/16   Mosie Lukes, MD  senna (SENOKOT) 8.6 MG tablet Take 2 tablets by mouth daily.    [provider]  spironolactone (ALDACTONE)  25 MG tablet Take 1 tablet (25 mg total) by mouth daily. 07/07/16   Ann Held, DO  temazepam (RESTORIL) 15 MG capsule 1-2 po qhs prn 06/13/16   Carollee Herter, Alferd Apa, DO  traZODone (DESYREL) 50 MG tablet Take 1 tablet (50 mg total) by mouth at bedtime. 06/13/16   Ann Held, DO    Family History Family History  Problem Relation Age of Onset  . Cancer Mother        colon cancer  . Cancer Father        colon cancer  . Cancer Sister 65       died of colon cancer  . Heart disease Neg Hx     Social History Social History   Tobacco Use  . Smoking status: Never Smoker  . Smokeless tobacco: Never Used  Substance Use Topics  . Alcohol use: No  . Drug use: No     Allergies   Aspirin   Review of Systems Review of Systems  Constitutional: Negative for fever.  HENT: Positive for rhinorrhea. Negative for sore throat.   Respiratory: Positive for cough. Negative for shortness of breath and wheezing.   Cardiovascular: Negative for chest pain.  Musculoskeletal: Negative for myalgias.  Skin: Negative for rash.  Neurological: Positive for headaches.  All other systems reviewed and are negative.    Physical Exam Updated Vital Signs BP (!) 120/92 (BP Location: Right Arm)   Pulse 100   Temp 98.2 F (36.8 C) (Oral)   Resp 10   Ht 5\' 4"  (1.626 m)   Wt 59 kg   SpO2 98%   BMI  22.31 kg/m   Physical Exam Vitals signs and nursing note reviewed.  Constitutional:      General: She is in acute distress.     Appearance: She is well-developed.  HENT:     Head: Normocephalic and atraumatic.     Right Ear: A middle ear effusion is present.     Left Ear: A middle ear effusion is present.     Nose: Mucosal edema and congestion present.     Mouth/Throat:     Pharynx: Oropharynx is clear. No pharyngeal swelling, oropharyngeal exudate or posterior oropharyngeal erythema.  Eyes:     Pupils: Pupils are equal, round, and reactive to light.  Cardiovascular:     Rate and Rhythm: Regular rhythm. Tachycardia present.     Heart sounds: Normal heart sounds. No murmur. No friction rub.  Pulmonary:     Effort: Pulmonary effort is normal.     Breath sounds: Normal breath sounds. No wheezing or rales.  Musculoskeletal: Normal range of motion.        General: No tenderness.     Comments: No edema  Skin:    General: Skin is warm and dry.     Findings: No rash.  Neurological:     General: No focal deficit present.     Mental Status: She is alert and oriented to person, place, and time.     Cranial Nerves: No cranial nerve deficit.  Psychiatric:        Mood and Affect: Mood normal.        Behavior: Behavior normal.        Thought Content: Thought content normal.      ED Treatments / Results  Labs (all labs ordered are listed, but only abnormal results are displayed) Labs Reviewed - No data to display  EKG None  Radiology No results found.  Procedures Procedures (including critical care time)  Medications Ordered in ED Medications - No data to display   Initial Impression / Assessment and Plan / ED Course  I have reviewed the triage vital signs and the nursing notes.  Pertinent labs & imaging results that were available during my care of the patient were reviewed by me and considered in my medical decision making (see chart for details).       Patient  with symptoms most all of allergies.  Her last 2 months she has had nasal congestion intermittent headaches, ear fullness and occasional cough.  She did travel to [place] 3 weeks ago again returned 2 weeks ago but states symptoms have not changed.  They were present prior to her travel and have been present since being home.  She is never had fever, chest pain or shortness of breath.  She has no known lung pathology.  She does take in a generous estrogen for hot flashes but symptoms are not classic for PE.  Patient has not taken any medications over-the-counter and takes no other meds.  Patient's oxygen saturation 98% on room air without tachypnea.  She states she is always a bit anxious but denies it being any worse.  Low suspicion for coronavirus at this time.  Patient will be treated with antihistamine.  Recommended nasal spray but she states they make her sick and she will not use them.  Final Clinical Impressions(s) / ED Diagnoses   Final diagnoses:  Environmental and seasonal allergies    ED Discharge Orders         Ordered    cetirizine (ZYRTEC ALLERGY) 10 MG tablet  Daily     09/07/18 1217           Blanchie Dessert, MD 09/07/18 1217

## 2018-09-07 NOTE — ED Triage Notes (Signed)
PT reports a head cold for  2 months  With cough and HA. Pt has ear congestion .

## 2018-09-07 NOTE — Discharge Instructions (Signed)
Make sure you are drinking plenty of water so the symptoms not very well.  If you can tolerate it using a Nettie pot or saline spray in your nose will help with the plugged feeling.  You can also use Tylenol for headaches.  If you develop severe facial pain, bloody nasal discharge and fever or if you start getting a severe cough chest pain and shortness of breath you should return to the emergency room for further evaluation.

## 2018-09-07 NOTE — ED Notes (Signed)
Pt's sps. Laney Pastor [phone]. Pls contact for updates

## 2018-09-07 NOTE — ED Notes (Signed)
Patient verbalizes understanding of discharge instructions . Opportunity for questions and answers were provided . Armband removed by staff ,Pt discharged from ED. W/C  offered at D/C  and Declined W/C at D/C and was escorted to lobby by RN.  

## 2018-09-13 DIAGNOSIS — I119 Hypertensive heart disease without heart failure: Secondary | ICD-10-CM | POA: Diagnosis not present

## 2018-09-13 DIAGNOSIS — G4700 Insomnia, unspecified: Secondary | ICD-10-CM | POA: Diagnosis not present

## 2018-09-13 DIAGNOSIS — F411 Generalized anxiety disorder: Secondary | ICD-10-CM | POA: Diagnosis not present

## 2018-09-13 DIAGNOSIS — Z79899 Other long term (current) drug therapy: Secondary | ICD-10-CM | POA: Diagnosis not present

## 2018-09-13 DIAGNOSIS — J302 Other seasonal allergic rhinitis: Secondary | ICD-10-CM | POA: Diagnosis not present

## 2018-09-13 DIAGNOSIS — E7800 Pure hypercholesterolemia, unspecified: Secondary | ICD-10-CM | POA: Diagnosis not present

## 2018-11-28 ENCOUNTER — Inpatient Hospital Stay (HOSPITAL_COMMUNITY)
Admission: EM | Admit: 2018-11-28 | Discharge: 2018-12-04 | DRG: 418 | Disposition: A | Discharge status: Home / Self Care | Payer: Medicare Other | Attending: Internal Medicine | Admitting: Internal Medicine

## 2018-11-28 ENCOUNTER — Emergency Department (HOSPITAL_COMMUNITY): Payer: Medicare Other

## 2018-11-28 ENCOUNTER — Other Ambulatory Visit: Payer: Self-pay

## 2018-11-28 DIAGNOSIS — Z87891 Personal history of nicotine dependence: Secondary | ICD-10-CM

## 2018-11-28 DIAGNOSIS — R9431 Abnormal electrocardiogram [ECG] [EKG]: Secondary | ICD-10-CM | POA: Diagnosis not present

## 2018-11-28 DIAGNOSIS — I493 Ventricular premature depolarization: Secondary | ICD-10-CM | POA: Diagnosis present

## 2018-11-28 DIAGNOSIS — G4700 Insomnia, unspecified: Secondary | ICD-10-CM | POA: Diagnosis not present

## 2018-11-28 DIAGNOSIS — D123 Benign neoplasm of transverse colon: Secondary | ICD-10-CM | POA: Diagnosis present

## 2018-11-28 DIAGNOSIS — I10 Essential (primary) hypertension: Secondary | ICD-10-CM | POA: Diagnosis present

## 2018-11-28 DIAGNOSIS — Z9181 History of falling: Secondary | ICD-10-CM

## 2018-11-28 DIAGNOSIS — M25551 Pain in right hip: Secondary | ICD-10-CM | POA: Diagnosis present

## 2018-11-28 DIAGNOSIS — Z96643 Presence of artificial hip joint, bilateral: Secondary | ICD-10-CM | POA: Diagnosis present

## 2018-11-28 DIAGNOSIS — K449 Diaphragmatic hernia without obstruction or gangrene: Secondary | ICD-10-CM | POA: Diagnosis present

## 2018-11-28 DIAGNOSIS — K8012 Calculus of gallbladder with acute and chronic cholecystitis without obstruction: Secondary | ICD-10-CM | POA: Diagnosis not present

## 2018-11-28 DIAGNOSIS — K219 Gastro-esophageal reflux disease without esophagitis: Secondary | ICD-10-CM | POA: Diagnosis present

## 2018-11-28 DIAGNOSIS — F419 Anxiety disorder, unspecified: Secondary | ICD-10-CM | POA: Diagnosis not present

## 2018-11-28 DIAGNOSIS — R195 Other fecal abnormalities: Secondary | ICD-10-CM | POA: Diagnosis not present

## 2018-11-28 DIAGNOSIS — Z419 Encounter for procedure for purposes other than remedying health state, unspecified: Secondary | ICD-10-CM

## 2018-11-28 DIAGNOSIS — Z8711 Personal history of peptic ulcer disease: Secondary | ICD-10-CM

## 2018-11-28 DIAGNOSIS — E876 Hypokalemia: Secondary | ICD-10-CM | POA: Diagnosis present

## 2018-11-28 DIAGNOSIS — Z03818 Encounter for observation for suspected exposure to other biological agents ruled out: Secondary | ICD-10-CM | POA: Diagnosis not present

## 2018-11-28 DIAGNOSIS — Z20828 Contact with and (suspected) exposure to other viral communicable diseases: Secondary | ICD-10-CM | POA: Diagnosis not present

## 2018-11-28 DIAGNOSIS — I455 Other specified heart block: Secondary | ICD-10-CM | POA: Diagnosis not present

## 2018-11-28 DIAGNOSIS — K828 Other specified diseases of gallbladder: Secondary | ICD-10-CM

## 2018-11-28 DIAGNOSIS — R740 Nonspecific elevation of levels of transaminase and lactic acid dehydrogenase [LDH]: Secondary | ICD-10-CM | POA: Diagnosis present

## 2018-11-28 DIAGNOSIS — N179 Acute kidney failure, unspecified: Secondary | ICD-10-CM | POA: Diagnosis not present

## 2018-11-28 DIAGNOSIS — D124 Benign neoplasm of descending colon: Secondary | ICD-10-CM | POA: Diagnosis present

## 2018-11-28 DIAGNOSIS — R55 Syncope and collapse: Secondary | ICD-10-CM | POA: Diagnosis not present

## 2018-11-28 DIAGNOSIS — R002 Palpitations: Secondary | ICD-10-CM

## 2018-11-28 DIAGNOSIS — K2970 Gastritis, unspecified, without bleeding: Secondary | ICD-10-CM | POA: Diagnosis present

## 2018-11-28 DIAGNOSIS — R634 Abnormal weight loss: Secondary | ICD-10-CM | POA: Diagnosis not present

## 2018-11-28 DIAGNOSIS — R7989 Other specified abnormal findings of blood chemistry: Secondary | ICD-10-CM | POA: Diagnosis present

## 2018-11-28 DIAGNOSIS — K648 Other hemorrhoids: Secondary | ICD-10-CM | POA: Diagnosis present

## 2018-11-28 DIAGNOSIS — D649 Anemia, unspecified: Secondary | ICD-10-CM | POA: Diagnosis present

## 2018-11-28 DIAGNOSIS — Z800 Family history of malignant neoplasm of digestive organs: Secondary | ICD-10-CM

## 2018-11-28 DIAGNOSIS — I471 Supraventricular tachycardia: Secondary | ICD-10-CM | POA: Diagnosis present

## 2018-11-28 DIAGNOSIS — K922 Gastrointestinal hemorrhage, unspecified: Secondary | ICD-10-CM | POA: Diagnosis not present

## 2018-11-28 DIAGNOSIS — R778 Other specified abnormalities of plasma proteins: Secondary | ICD-10-CM

## 2018-11-28 DIAGNOSIS — K810 Acute cholecystitis: Secondary | ICD-10-CM

## 2018-11-28 LAB — COMPREHENSIVE METABOLIC PANEL
ALT: 16 U/L (ref 0–44)
AST: 30 U/L (ref 15–41)
Albumin: 4.3 g/dL (ref 3.5–5.0)
Alkaline Phosphatase: 55 U/L (ref 38–126)
Anion gap: 14 (ref 5–15)
BUN: 9 mg/dL (ref 8–23)
CO2: 33 mmol/L — ABNORMAL HIGH (ref 22–32)
Calcium: 10.1 mg/dL (ref 8.9–10.3)
Chloride: 91 mmol/L — ABNORMAL LOW (ref 98–111)
Creatinine, Ser: 1.31 mg/dL — ABNORMAL HIGH (ref 0.44–1.00)
GFR calc Af Amer: 47 mL/min — ABNORMAL LOW (ref >60)
GFR calc non Af Amer: 41 mL/min — ABNORMAL LOW (ref >60)
Glucose, Bld: 110 mg/dL — ABNORMAL HIGH (ref 70–99)
Potassium: 2.6 mmol/L — CL (ref 3.5–5.1)
Sodium: 138 mmol/L (ref 135–145)
Total Bilirubin: 1.4 mg/dL — ABNORMAL HIGH (ref 0.3–1.2)
Total Protein: 8.1 g/dL (ref 6.5–8.1)

## 2018-11-28 LAB — CBC
HCT: 36.9 % (ref 36.0–46.0)
Hemoglobin: 12.5 g/dL (ref 12.0–15.0)
MCH: 31.1 pg (ref 26.0–34.0)
MCHC: 33.9 g/dL (ref 30.0–36.0)
MCV: 91.8 fL (ref 80.0–100.0)
Platelets: 352 10*3/uL (ref 150–400)
RBC: 4.02 MIL/uL (ref 3.87–5.11)
RDW: 14.8 % (ref 11.5–15.5)
WBC: 10 10*3/uL (ref 4.0–10.5)
nRBC: 0 % (ref 0.0–0.2)

## 2018-11-28 LAB — TYPE AND SCREEN
ABO/RH(D): O POS
Antibody Screen: NEGATIVE

## 2018-11-28 LAB — MAGNESIUM: Magnesium: 2 mg/dL (ref 1.7–2.4)

## 2018-11-28 LAB — RAPID URINE DRUG SCREEN, HOSP PERFORMED
Amphetamines: NOT DETECTED
Barbiturates: NOT DETECTED
Benzodiazepines: POSITIVE — AB
Cocaine: NOT DETECTED
Opiates: NOT DETECTED
Tetrahydrocannabinol: NOT DETECTED

## 2018-11-28 LAB — CBG MONITORING, ED: Glucose-Capillary: 125 mg/dL — ABNORMAL HIGH (ref 70–99)

## 2018-11-28 LAB — POC OCCULT BLOOD, ED: Fecal Occult Bld: NEGATIVE

## 2018-11-28 MED ORDER — HYDRALAZINE HCL 20 MG/ML IJ SOLN: 5.0000 mg | INTRAMUSCULAR | Status: DC | PRN

## 2018-11-28 MED ORDER — SODIUM CHLORIDE 0.9 % IV SOLN
INTRAVENOUS | Status: DC
  Administered 2018-11-28: 100 mL/h via INTRAVENOUS
  Administered 2018-11-29 – 2018-12-01 (×5): via INTRAVENOUS

## 2018-11-28 MED ORDER — ACETAMINOPHEN 650 MG RE SUPP: 650.0000 mg | Freq: Four times a day (QID) | RECTAL | Status: DC | PRN

## 2018-11-28 MED ORDER — MAGNESIUM SULFATE IN D5W 1-5 GM/100ML-% IV SOLN
1.0000 g | Freq: Once | INTRAVENOUS | Status: AC
  Administered 2018-11-28: 1 g via INTRAVENOUS
  Filled 2018-11-28: qty 100

## 2018-11-28 MED ORDER — POTASSIUM CHLORIDE 10 MEQ/100ML IV SOLN
10.0000 meq | INTRAVENOUS | Status: AC
  Administered 2018-11-28 (×2): 10 meq via INTRAVENOUS
  Filled 2018-11-28 (×2): qty 100

## 2018-11-28 MED ORDER — IOHEXOL 300 MG/ML  SOLN
80.0000 mL | Freq: Once | INTRAMUSCULAR | Status: AC | PRN
  Administered 2018-11-28: 80 mL via INTRAVENOUS

## 2018-11-28 MED ORDER — SODIUM CHLORIDE 0.9 % IV BOLUS
1000.0000 mL | Freq: Once | INTRAVENOUS | Status: AC
  Administered 2018-11-28: 1000 mL via INTRAVENOUS

## 2018-11-28 MED ORDER — POTASSIUM CHLORIDE CRYS ER 20 MEQ PO TBCR
40.0000 meq | EXTENDED_RELEASE_TABLET | Freq: Once | ORAL | Status: AC
  Administered 2018-11-28: 40 meq via ORAL
  Filled 2018-11-28: qty 2

## 2018-11-28 MED ORDER — SODIUM CHLORIDE 0.9% FLUSH
3.0000 mL | Freq: Two times a day (BID) | INTRAVENOUS | Status: DC
  Administered 2018-11-29 – 2018-12-04 (×6): 3 mL via INTRAVENOUS

## 2018-11-28 MED ORDER — TRAZODONE HCL 50 MG PO TABS: 50.0000 mg | ORAL_TABLET | Freq: Every evening | ORAL | Status: DC | PRN

## 2018-11-28 MED ORDER — ACETAMINOPHEN 325 MG PO TABS
650.0000 mg | ORAL_TABLET | Freq: Four times a day (QID) | ORAL | Status: DC | PRN
  Administered 2018-11-28 – 2018-12-01 (×5): 650 mg via ORAL
  Filled 2018-11-28 (×5): qty 2

## 2018-11-28 MED ORDER — PANTOPRAZOLE SODIUM 40 MG PO TBEC
40.0000 mg | DELAYED_RELEASE_TABLET | Freq: Two times a day (BID) | ORAL | Status: DC
  Administered 2018-11-28 – 2018-12-04 (×10): 40 mg via ORAL
  Filled 2018-11-28 (×11): qty 1

## 2018-11-28 MED ORDER — TEMAZEPAM 15 MG PO CAPS: 15.0000 mg | ORAL_CAPSULE | Freq: Every evening | ORAL | Status: DC | PRN

## 2018-11-28 MED ORDER — DIAZEPAM 5 MG PO TABS
10.0000 mg | ORAL_TABLET | Freq: Two times a day (BID) | ORAL | Status: DC | PRN
  Administered 2018-11-28 – 2018-12-02 (×5): 10 mg via ORAL
  Filled 2018-11-28 (×6): qty 2

## 2018-11-28 MED ORDER — CONJ ESTROG-MEDROXYPROGEST ACE 0.3-1.5 MG PO TABS
1.0000 | ORAL_TABLET | Freq: Every day | ORAL | Status: DC
  Administered 2018-11-29 – 2018-12-03 (×4): 1 via ORAL
  Filled 2018-11-28: qty 1

## 2018-11-28 MED ORDER — ATROPINE SULFATE 1 MG/10ML IJ SOSY: 0.5000 mg | PREFILLED_SYRINGE | INTRAMUSCULAR | Status: DC | PRN

## 2018-11-28 NOTE — H&P (Addendum)
History and Physical    Brandi [name] IRS:[id number] DOB: 12/09/1945 DOA: 11/28/2018  Referring MD/NP/PA:   PCP: Brandi Liberty, MD   Patient coming from:  The patient is coming from home.  At baseline, pt is independent for most of ADL.        Chief Complaint: dark tarry stool and syncope  HPI: Brandi [name] is a 73 y.o. female with medical history significant of hypertension, GERD, depression, anxiety, GI bleeding, gastric ulcer, insomnia, who presents with dark stool, and syncope.  Patient states that she has been having intermittent dark tarry stool for more than 2 weeks. She had a small amount of bright red blood today.  No active bleeding now.  She states that she has nausea and vomited twice earlier, none biliary nonbloody vomitus.  She complains of severe heart burning sensation in her chest, no diarrhea or abdominal pain.  Patient states that she has poor appetite and decreased oral intake, and lost 15 pounds recently. Patient denies shortness of breath, cough, fever or chills.  Denies symptoms of UTI.  Per EDP, "patient returned from her CT scan and appeared of syncopal episode.  She was found by the technician to be "not responsive".  No dermatologic findings or hypotension to suggest acute contrast reaction.  Telemetry was reviewed and there does not appear to be any evidence of torsades or VT in relation to her hypokalemia". She states that she felt lightheadedness and heart racing. Currently patient denies any unilateral weakness, numbness or tingling his extremities.  No facial droop or slurred speech.  The EKG showed QTc 669 with skipped beats.  The repeated EKG showed frequent PVC with skipped beats. Patient states that she has history of skipped heartbeat ever since year 2000.  Her PCP told her that this could be normal to her.  Cardiology, Dr. Neena [name] reviewed EKG per ED physician's request, he did not think patient has high-grade AV block or sinus pause.  ED Course: pt  was found to have hyperkalemia with potassium 2.6, pending magnesium level, hemoglobin 12.5 (10.7 on 04/18/2016), negative FOBT, pending COVID-19 test, heart rate 43- 101, oxygen saturation 100% on room air, blood pressure 163/70.  Patient is placed on stepdown bed for observation.  CT-Abd/pelvis showed: 1. Diffuse gallbladder wall thickening, maximum of 9 mm. The etiology of this is unclear. Recommend further assessment with limited right upper quadrant ultrasound. 2. Mild haziness along the wall of the lower esophagus at the gastroesophageal junction. This may reflect inflammation. Consider follow-up upper endoscopy for further assessment. 3. No evidence of a bowel mass, obstruction or inflammation. No other findings to account for GI bleeding.  Review of Systems:   General: no fevers, chills, no body weight gain, has poor appetite, has fatigue HEENT: no blurry vision, hearing changes or sore throat Respiratory: no dyspnea, coughing, wheezing CV: no chest pain, has palpitations GI: has nausea, vomiting, dark stool and hart burning. No abdominal pain, diarrhea, constipation GU: no dysuria, burning on urination, increased urinary frequency, hematuria  Ext: no leg edema Neuro: no unilateral weakness, numbness, or tingling, no vision change or hearing loss. Has syncope. Skin: no rash, no skin tear. MSK: No muscle spasm, no deformity, no limitation of range of movement in spin Heme: No easy bruising.  Travel history: No recent long distant travel.  Allergy:  Allergies  Allergen Reactions   Aspirin Other (See Comments)    Stomach ulcer, now inactive, OK with low dose ASA    Past Medical History:  Diagnosis Date   Anxiety    takes Valium daily as needed   Chronic back pain    Chronic back pain    Complication of anesthesia 1980s   "I stopped breathing on the table; I was gettin my wisdom teeth extracted"   Constipation    Depression    takes Lexapro daily   Dysrhythmia     pt. reports that her heart skips sometimes    GERD (gastroesophageal reflux disease)    takes Zantac daily   History of blood transfusion    no abnormal reaction noted   History of colon polyps    benign   History of gastric ulcer    History of GI bleed    History of hiatal hernia    History of stress test    done in [place]- done in  1992, told that it was normal.    Hypertension    Insomnia    takes Restoril and Trazodone nightly as needed   Joint pain    Joint swelling    Nocturia    Osteoarthritis    Peripheral edema    occasionally but never been on fluid pill    Past Surgical History:  Procedure Laterality Date   BACK SURGERY     COLONOSCOPY     ESOPHAGOGASTRODUODENOSCOPY N/A 06/16/2014   Procedure: ESOPHAGOGASTRODUODENOSCOPY (EGD);  Surgeon: Brandi Sabins, MD;  Location: [facility] ENDOSCOPY;  Service: Endoscopy;  Laterality: N/A;   JOINT REPLACEMENT     LUMBAR FUSION     SHOULDER SURGERY Left    TOE SURGERY Right    "big toe was coming off"   TOE SURGERY Left    "bone spur"   TOTAL HIP ARTHROPLASTY Left 02/26/2015   Procedure: TOTAL HIP ARTHROPLASTY ANTERIOR APPROACH;  Surgeon: Brandi Pear, MD;  Location: [place];  Service: Orthopedics;  Laterality: Left;   TOTAL HIP ARTHROPLASTY Right 03/21/2016   Procedure: TOTAL HIP ARTHROPLASTY ANTERIOR APPROACH;  Surgeon: Brandi Pear, MD;  Location: [place];  Service: Orthopedics;  Laterality: Right;   WISDOM TOOTH EXTRACTION  1980s    Social History:  reports that she has never smoked. She has never used smokeless tobacco. She reports that she does not drink alcohol or use drugs.  Family History:  Family History  Problem Relation Age of Onset   Cancer Mother        colon cancer   Cancer Father        colon cancer   Cancer Sister 13       died of colon cancer   Heart disease Neg Hx      Prior to Admission medications   Medication Sig Start Date End Date Taking? Authorizing Provider  diazepam (VALIUM) 10  MG tablet TAKE 1 TABLET BY MOUTH EVERY 12 HOURS AS NEEDED FOR ANXIETY Patient taking differently: Take 10 mg by mouth every 12 (twelve) hours as needed for anxiety. TAKE 1 TABLET BY MOUTH EVERY 12 HOURS AS NEEDED FOR ANXIETY 05/19/16  Yes Roma Schanz R, DO  diclofenac sodium (VOLTAREN) 1 % GEL APPLY 2 G TOPICALLY 4 (FOUR) TIMES DAILY. Patient taking differently: Apply 2 g topically as needed (pain).  02/05/15  Yes Roma Schanz R, DO  estrogen, conjugated,-medroxyprogesterone (PREMPRO) 0.3-1.5 MG tablet Take 1 tablet by mouth daily. Patient taking differently: Take 1 tablet by mouth at bedtime.  09/05/15  Yes Roma Schanz R, DO  temazepam (RESTORIL) 15 MG capsule 1-2 po qhs prn Patient taking differently: Take 15  mg by mouth at bedtime as needed for sleep. 1-2 po qhs prn 06/13/16  Yes Lowne Lyndal Pulley R, DO  traZODone (DESYREL) 50 MG tablet Take 1 tablet (50 mg total) by mouth at bedtime. 06/13/16  Yes Ann Held, DO  aspirin EC 325 MG tablet Take 1 tablet (325 mg total) by mouth 2 (two) times daily. Patient not taking: Reported on 11/28/2018 03/21/16   Leighton Parody, PA-C  benzonatate (TESSALON PERLES) 100 MG capsule Take 1-2 capsules (100-200 mg total) by mouth 3 (three) times daily as needed for cough. Patient not taking: Reported on 11/28/2018 07/09/16   Smiley Houseman, MD  cetirizine (ZYRTEC ALLERGY) 10 MG tablet Take 1 tablet (10 mg total) by mouth daily. Patient not taking: Reported on 11/28/2018 09/07/18   Blanchie Dessert, MD  cyclobenzaprine (FLEXERIL) 5 MG tablet Take 1 tablet (5 mg total) by mouth 3 (three) times daily. Patient not taking: Reported on 11/28/2018 04/18/16   Carollee Herter, Alferd Apa, DO  doxycycline (VIBRA-TABS) 100 MG tablet Take 1 tablet (100 mg total) by mouth 2 (two) times daily. Patient not taking: Reported on 11/28/2018 06/13/16   Carollee Herter, Alferd Apa, DO  fluticasone [facility]) 50 MCG/ACT nasal spray Place 2 sprays into both nostrils  daily. Patient not taking: Reported on 11/28/2018 06/03/16   Saguier, Percell Miller, PA-C  hydrochlorothiazide (HYDRODIURIL) 25 MG tablet Take 1 tablet (25 mg total) by mouth daily. Patient not taking: Reported on 11/28/2018 11/23/15   Carollee Herter, Alferd Apa, DO  hydrocortisone cream 1 % Apply to affected area 2 times daily Patient not taking: Reported on 11/28/2018 10/11/15   Hedges, Dellis Filbert, PA-C  mometasone (NASONEX) 50 MCG/ACT nasal spray Place 2 sprays into the nose daily. Patient not taking: Reported on 11/28/2018 06/13/16   Carollee Herter, Kendrick Fries R, DO  ondansetron (ZOFRAN) 4 MG tablet Take 1 tablet (4 mg total) by mouth every 8 (eight) hours as needed for nausea or vomiting. Patient not taking: Reported on 11/28/2018 10/07/16   Wallene Huh, DPM  ondansetron (ZOFRAN) 4 MG tablet Take 1 tablet (4 mg total) by mouth every 8 (eight) hours as needed for nausea or vomiting. Patient not taking: Reported on 11/28/2018 10/23/16   Wallene Huh, DPM  oseltamivir (TAMIFLU) 75 MG capsule Take 1 capsule (75 mg total) by mouth 2 (two) times daily. Take for 5 days Patient not taking: Reported on 11/28/2018 07/07/16   Carollee Herter, Alferd Apa, DO  polyethylene glycol [facility]) packet Take 17 g by mouth daily. For constipation Patient not taking: Reported on 11/28/2018 07/09/16   Smiley Houseman, MD  ranitidine (ZANTAC) 150 MG tablet TAKE 1 TABLET BY MOUTH 2 (TWO) TIMES DAILY. Patient not taking: TAKE 1 TABLET BY MOUTH 2 (TWO) TIMES DAILY. 09/01/16   Mosie Lukes, MD  spironolactone (ALDACTONE) 25 MG tablet Take 1 tablet (25 mg total) by mouth daily. Patient not taking: Reported on 11/28/2018 07/07/16   Ann Held, DO    Physical Exam: Vitals:   11/28/18 1700 11/28/18 1915 11/28/18 1930 11/28/18 2000  BP: (!) 163/70  139/70 132/88  Pulse: (!) 45 (!) 101 (!) 31   Resp: 16 (!) 9 14 13   SpO2: 100% 100% 100%    General: Not in acute distress HEENT:       Eyes: PERRL, EOMI, no scleral icterus.       ENT:  No discharge from the ears and nose, no pharynx injection, no tonsillar enlargement.  Neck: No JVD, no bruit, no mass felt. Heme: No neck lymph node enlargement. Cardiac: S1/S2, RRR, occasional skipped beats. No murmurs, No gallops or rubs. Respiratory: No rales, wheezing, rhonchi or rubs. GI: Soft, nondistended, nontender, no rebound pain, no organomegaly, BS present. GU: No hematuria Ext: No pitting leg edema bilaterally. 2+DP/PT pulse bilaterally. Musculoskeletal: No joint deformities, No joint redness or warmth, no limitation of ROM in spin. Skin: No rashes.  Neuro: Alert, oriented X3, cranial nerves II-XII grossly intact, moves all extremities normally. Muscle strength 5/5 in all extremities, sensation to light touch intact. Brachial reflex 2+ bilaterally. Normal finger to nose test. Psych: Patient is not psychotic, no suicidal or hemocidal ideation.  Labs on Admission: I have personally reviewed following labs and imaging studies  CBC: Recent Labs  Lab 11/28/18 1545  WBC 10.0  HGB 12.5  HCT 36.9  MCV 91.8  PLT 989   Basic Metabolic Panel: Recent Labs  Lab 11/28/18 1545  NA 138  K 2.6*  CL 91*  CO2 33*  GLUCOSE 110*  BUN 9  CREATININE 1.31*  CALCIUM 10.1   GFR: CrCl cannot be calculated (Unknown ideal weight.). Liver Function Tests: Recent Labs  Lab 11/28/18 1545  AST 30  ALT 16  ALKPHOS 55  BILITOT 1.4*  PROT 8.1  ALBUMIN 4.3   No results for input(s): LIPASE, AMYLASE in the last 168 hours. No results for input(s): AMMONIA in the last 168 hours. Coagulation Profile: No results for input(s): INR, PROTIME in the last 168 hours. Cardiac Enzymes: No results for input(s): CKTOTAL, CKMB, CKMBINDEX, TROPONINI in the last 168 hours. BNP (last 3 results) No results for input(s): PROBNP in the last 8760 hours. HbA1C: No results for input(s): HGBA1C in the last 72 hours. CBG: Recent Labs  Lab 11/28/18 1912  GLUCAP 125*   Lipid Profile: No results  for input(s): CHOL, HDL, LDLCALC, TRIG, CHOLHDL, LDLDIRECT in the last 72 hours. Thyroid Function Tests: No results for input(s): TSH, T4TOTAL, FREET4, T3FREE, THYROIDAB in the last 72 hours. Anemia Panel: No results for input(s): VITAMINB12, FOLATE, FERRITIN, TIBC, IRON, RETICCTPCT in the last 72 hours. Urine analysis:    Component Value Date/Time   COLORURINE YELLOW 03/23/2016 [place] 03/23/2016 1403   LABSPEC 1.020 03/23/2016 1403   PHURINE 7.0 03/23/2016 1403   GLUCOSEU NEGATIVE 03/23/2016 1403   HGBUR NEGATIVE 03/23/2016 1403   BILIRUBINUR neg 04/18/2016 1534   KETONESUR NEGATIVE 03/23/2016 1403   PROTEINUR neg 04/18/2016 1534   PROTEINUR NEGATIVE 03/23/2016 1403   UROBILINOGEN 1.0 04/18/2016 1534   UROBILINOGEN 1.0 02/15/2015 1209   NITRITE neg 04/18/2016 1534   NITRITE NEGATIVE 03/23/2016 1403   LEUKOCYTESUR Trace (A) 04/18/2016 1534   Sepsis Labs: @LABRCNTIP (procalcitonin:4,lacticidven:4) )No results found for this or any previous visit (from the past 240 hour(s)).   Radiological Exams on Admission: Ct Abdomen Pelvis W Contrast  Result Date: 11/28/2018 CLINICAL DATA:  GI bleed. Weight loss abdominal pain. Black tarry stools for 1 week. Bright red blood per rectum 2 weeks before that. EXAM: CT ABDOMEN AND PELVIS WITH CONTRAST TECHNIQUE: Multidetector CT imaging of the abdomen and pelvis was performed using the standard protocol following bolus administration of intravenous contrast. CONTRAST:  61mL OMNIPAQUE IOHEXOL 300 MG/ML  SOLN COMPARISON:  CT, 06/15/2014 FINDINGS: Lower chest: Lung bases essentially clear. Heart normal size. Pectus excavatum deformity. Hepatobiliary: 2, 4 mm low-density lesions in the right lobe consistent with cysts. Focal fat adjacent to the falciform ligament. No other liver  abnormality. There is diffuse gallbladder wall thickening, maximum of 9 mm. No convincing gallstone. No bile duct dilation. Pancreas: Unremarkable. No pancreatic  ductal dilatation or surrounding inflammatory changes. Spleen: Normal in size without focal abnormality. Adrenals/Urinary Tract: No adrenal masses. Kidneys normal in overall size, orientation and position. 2.6 cm midpole right renal cyst. Small low-density lesion in the anterior midpole the left kidney, also likely a cyst but not fully characterized. No other renal masses or lesions. No hydronephrosis. Ureters normal in course and in caliber. Bladder is unremarkable, but partly obscured by artifact from bilateral hip prostheses. Stomach/Bowel: Mild haziness along wall of the distal esophagus at the gastroesophageal junction. This is nonspecific. Stomach is unremarkable. Small bowel and colon are normal in caliber. No evidence of a mass. No wall thickening or inflammation. Normal appendix visualized. Vascular/Lymphatic: No significant vascular findings are present. No enlarged abdominal or pelvic lymph nodes. Reproductive: Uterus is heterogeneous. 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 Other: No abdominal wall hernia.  No ascites. Musculoskeletal: Status post L4-L5 fusion. Status post bilateral total hip arthroplasties. No fracture or acute finding. IMPRESSION: 1. Diffuse gallbladder wall thickening, maximum of 9 mm. The etiology of this is unclear. Recommend further assessment with limited right upper quadrant ultrasound. 2. Mild haziness along the wall of the lower esophagus at the gastroesophageal junction. This may reflect inflammation. Consider follow-up upper endoscopy for further assessment. 3. No evidence of a bowel mass, obstruction or inflammation. No other findings to account for GI bleeding. Electronically Signed   By: Lajean Manes M.D.   On: 11/28/2018 19:26     EKG: Independently reviewed.  First EKG showed a QTC 669, LAE, low voltage, anteroseptal infarction pattern, skipped beat; second EKG showed QTC 620, frequent PVC; third EKG showed QTC 580, with skipped  beat.  ED physician discussed with on-call cardiologist, Dr. Neena [name] who reviewed EKG, and he did not think this is sinus pause or high-grade AV block.    Assessment/Plan Principal Problem:   Syncope Active Problems:   GERD (gastroesophageal reflux disease)   Essential hypertension, benign   Hypokalemia   Insomnia   QT prolongation   Anxiety   Dark stools   Syncope: Etiology is not clear, likely due to cardiac etiology.  Patient has severe hypokalemia with potassium 2.6, EKG showed prolonged QTC 669, and frequent PVC, not sure if patient could have had V. tach during the events. Patient also has skipped heart beat.  Per patient, she has history of skipped heartbeats which was considered normal to her by her PCP. No focal neurologic findings, less likely to have TIA or stroke.  ED physician discussed with on-call cardiologist, Dr. Neena [name] who reviewed EKG, and he did not think this is sinus pause or high-grade AV block.  -will place on SDU for obs -Correct hypokalemia to keep potassium above 4 and magnesium above 2 -Pacer pads hypokalemia the bedside -As needed atropine for symptomatic bradycardia -check UDS, A1c, FLP, trop x 3, TSH -get 2d echo. -no ASA due to GIB -Orthostatic vital signs -IV fluid:  Addendum: the repeated trop becomes positive 0.10. Cannot use IV heparin due to GIB -prn NGT and morphine for CP -Start Lipitor 80 mg daily -will request card consult via Epic  Hypokalemia: K=2.6 on admission. - Repleted with total of 100 mEQ of KCl - Check Mg level - Give 1 g of magnesium sulfate  Addendum: after pt received total of 100 mEq of KCl, the repeated K at  2:30 is still 2.6. -will repeat stat K level to make sure this number is right. -10 mEq of KCl by IV and 40 mEq of oral KCl were ordered by floor covering PA-->will continue IV KCl and hold off oral KCl until get the repeated K level  QT prolongation and frequent PVC: Likely due to electrolytes  disturbance -Telemetry monitor - Correction of hypokalemia as above  GERD (gastroesophageal reflux disease): -on protonic  HTN: bp 124/84-163/70. No taking meds at home -IV hydralazine prn  Insomnia: -pt is on prn Valium  Anxiety: -Continue home Valium  Dark stools: Hgb stale, Hgb 12.5 now. FOBT negative. Pt has hx of gastric ulcer disease, now has heart burning. - NPO for possible EGD - IVF: 1L NS bolus, then at 100 mL/hr - Start IV pantoprazole 40 mg bid - Zofran IV for nausea - Avoid NSAIDs and SQ heparin - Maintain IV access (2 large bore IVs if possible). - Monitor closely and follow q6h cbc, transfuse as necessary, if Hgb<7.0 - LaB: INR, PTT and type screen - please call GI in AM if Hgb drops   Diffuse gallbladder wall thickening: CT scan showed diffuse gallbladder wall thickening, maximum of 9 mm. The etiology of this is unclear.  -will get RUQ-US      DVT ppx: SCD Code Status: Full code Family Communication: None at bed side.    Disposition Plan:  Anticipate discharge back to previous home environment Consults called:  EDP discussed with Card, Dr. Neena [name] Admission status: SDU/obs   Date of Service 11/28/2018    [facility]   If 7PM-7AM, please contact night-coverage [email or web address] Password [facility] 11/28/2018, 8:43 PM

## 2018-11-28 NOTE — ED Notes (Signed)
ED Provider at bedside. 

## 2018-11-28 NOTE — ED Provider Notes (Signed)
7:14 PM Patient returned from CT and had a syncopal episode.  Patient reports that she had lightheadedness and palpitations prior to this.  She was not on her monitor as she was coming back from CT.  Once she was returned onto the monitor she became responsive and was alert and oriented with a normal pulse.  She was noted to be in sinus rhythm with frequent PVCs.  She definitely could have had VT from her hypokalemia.  Her potassium is being replaced.  She will need to be admitted to the hospital for observation on telemetry given what sounds like syncope and palpitations in the setting of hypokalemia.  CT scan pending at this time.  ECG interpretation #2    Date: 11/28/2018  Rate: 86  Rhythm: normal sinus rhythm  QRS Axis: normal  Intervals: normal  ST/T Wave abnormalities: normal  Conduction Disutrbances: frequent PVC  Narrative Interpretation:   Old EKG Reviewed: No significant changes noted      Brandi Schmidt, MD 11/28/18 1916

## 2018-11-28 NOTE — ED Provider Notes (Signed)
Physical Exam  BP (!) 146/86 (BP Location: Right Arm)   Pulse 70   Temp (!) 96.4 F (35.8 C) (Oral)   Resp 12   Ht 5\' 4"  (1.626 m)   Wt 52.7 kg   SpO2 100%   BMI 19.94 kg/m   Assumed care from Dr. Venora Maples at [phone]. Briefly, the patient is a 73 y.o. female with PMHx of  has a past medical history of Anxiety, Chronic back pain, Chronic back pain, Complication of anesthesia (1980s), Constipation, Depression, Dysrhythmia, GERD (gastroesophageal reflux disease), History of blood transfusion, History of colon polyps, History of gastric ulcer, History of GI bleed, History of hiatal hernia, History of stress test, Hypertension, Insomnia, Joint pain, Joint swelling, Nocturia, Osteoarthritis, and Peripheral edema. here with possible dark stools, nausea, vomiting and weight loss.   Physical Exam Vitals signs and nursing note reviewed.  Constitutional:      General: She is not in acute distress.    Appearance: She is well-developed. She is diaphoretic.     Comments: Alert and interactive but slightly diaphoretic on initial evaluation.   HENT:     Head: Normocephalic and atraumatic.  Eyes:     General:        Right eye: No discharge.        Left eye: No discharge.     Conjunctiva/sclera: Conjunctivae normal.     Comments: EOMs normal to gross examination.  Neck:     Musculoskeletal: Normal range of motion.  Cardiovascular:     Rate and Rhythm: Normal rate and regular rhythm.     Comments: Intact, 2+ radial pulse. Pulmonary:     Comments: Converses comfortably in full sentences. Abdominal:     General: There is no distension.  Musculoskeletal: Normal range of motion.  Skin:    General: Skin is warm.  Neurological:     Mental Status: She is alert.     Comments: Cranial nerves intact to gross observation. Patient moves extremities without difficulty.  Psychiatric:        Behavior: Behavior normal.        Thought Content: Thought content normal.        Judgment: Judgment normal.      ED Course/Procedures   Clinical Course as of Nov 27 2245  Sun Nov 28, 2018  1947 Reviewed EKG with Dr. Blaine Hamper of [facility]. Findings concerning for second degree AV block. Pacer pads placed on pt. Repleting electrolytes.    [AM]  2006 Spoke with Dr. Neena Rhymes who states that pts EKG is not in AV block. He states that she has clusters of 3 beats before a PAC.    [AM]  2013 Dr. Blaine Hamper to admit. Appreciate his involvement.    [AM]    Clinical Course User Index [AM] Albesa Seen, PA-C    Procedures   MDM    7:09 PM patient returned from her CT scan and appeared of syncopal episode.  She was found by the technician to be "not responsive".  No dermatologic findings or hypotension to suggest acute contrast reaction.  Telemetry was reviewed and there does not appear to be any evidence of torsades or VT in relation to her hypokalemia.  Will admit for syncope work-up.  Potassium and magnesium repleted here in emergency department.  Patient kept on telemetry.  Cardiology evaluated EKG and felt that it was not indicative of AV block or sinus pause. Dr. Blaine Hamper of [facility] to admit.    Brandi Masker B, PA-C 11/28/18 2250  Brandi Schmidt, MD 11/29/18 1407

## 2018-11-28 NOTE — ED Notes (Signed)
Pt returning from CT is noticed to have a change in appearance and cognition. Pale, slow speech however neuro intact, with elevated HR. EDP called to the bedside. Pts cbg collected. Fluids being administered and EKG collected

## 2018-11-28 NOTE — ED Notes (Signed)
Medication delayed. Pt is being transported for CT.

## 2018-11-28 NOTE — ED Notes (Addendum)
Bladder scanned. 0 ml. Pt urinated through [employer].

## 2018-11-28 NOTE — ED Notes (Signed)
IV team at bedside 

## 2018-11-28 NOTE — ED Provider Notes (Signed)
[facility] EMERGENCY DEPARTMENT Provider Note   CSN: [id number] Arrival date & time: 11/28/18  1454     History   Chief Complaint Chief Complaint  Patient presents with  . GI Problem    HPI Brandi [name] is a 73 y.o. female.     HPI 73 year old female reports intermittent melanotic stool over the past 2 weeks.  She had a small amount of bright red blood today.  No active bleeding now.  She feels like she is lost approximately 15 pounds over the past several weeks.  She has a strong family history of colon cancer.  Her last colonoscopy was 5 years ago.  She has not had repeat colonoscopy since then.  She denies nausea vomiting.  Reports some intermittent loose stools.   Past Medical History:  Diagnosis Date  . Anxiety    takes Valium daily as needed  . Chronic back pain   . Chronic back pain   . Complication of anesthesia 1980s   "I stopped breathing on the table; I was gettin my wisdom teeth extracted"  . Constipation   . Depression    takes Lexapro daily  . Dysrhythmia    pt. reports that her heart skips sometimes   . GERD (gastroesophageal reflux disease)    takes Zantac daily  . History of blood transfusion    no abnormal reaction noted  . History of colon polyps    benign  . History of gastric ulcer   . History of GI bleed   . History of hiatal hernia   . History of stress test    done in [place]- done in  1992, told that it was normal.   . Hypertension   . Insomnia    takes Restoril and Trazodone nightly as needed  . Joint pain   . Joint swelling   . Nocturia   . Osteoarthritis   . Peripheral edema    occasionally but never been on fluid pill    Patient Active Problem List   Diagnosis Date Noted  . Arthritis of right hip 03/21/2016  . Primary osteoarthritis of right hip 03/20/2016  . Right ear impacted cerumen 03/12/2016  . Fall at home 10/21/2015  . Leg pain 09/27/2015  . Fatigue 04/22/2015  . Primary osteoarthritis of left hip  02/25/2015  . Pre-op evaluation 02/22/2015  . Sinusitis, chronic 01/30/2015  . Insomnia 01/30/2015  . Lymphadenopathy 11/24/2014  . Hypokalemia 06/15/2014  . Rectal bleeding 06/15/2014  . AKI (acute kidney injury) ([place]) 06/15/2014  . Essential hypertension, benign 04/10/2014  . Tachycardia 04/10/2014  . GERD (gastroesophageal reflux disease) 11/08/2013  . Chronic constipation 11/08/2013    Past Surgical History:  Procedure Laterality Date  . BACK SURGERY    . COLONOSCOPY    . ESOPHAGOGASTRODUODENOSCOPY N/A 06/16/2014   Procedure: ESOPHAGOGASTRODUODENOSCOPY (EGD);  Surgeon: Missy Sabins, MD;  Location: [facility] ENDOSCOPY;  Service: Endoscopy;  Laterality: N/A;  . JOINT REPLACEMENT    . LUMBAR FUSION    . SHOULDER SURGERY Left   . TOE SURGERY Right    "big toe was coming off"  . TOE SURGERY Left    "bone spur"  . TOTAL HIP ARTHROPLASTY Left 02/26/2015   Procedure: TOTAL HIP ARTHROPLASTY ANTERIOR APPROACH;  Surgeon: Frederik Pear, MD;  Location: [facility];  Service: Orthopedics;  Laterality: Left;  . TOTAL HIP ARTHROPLASTY Right 03/21/2016   Procedure: TOTAL HIP ARTHROPLASTY ANTERIOR APPROACH;  Surgeon: Frederik Pear, MD;  Location: [facility];  Service:  Orthopedics;  Laterality: Right;  . WISDOM TOOTH EXTRACTION  1980s     OB History   No obstetric history on file.      Home Medications    Prior to Admission medications   Medication Sig Start Date End Date Taking? Authorizing Provider  diazepam (VALIUM) 10 MG tablet TAKE 1 TABLET BY MOUTH EVERY 12 HOURS AS NEEDED FOR ANXIETY Patient taking differently: Take 10 mg by mouth every 12 (twelve) hours as needed for anxiety. TAKE 1 TABLET BY MOUTH EVERY 12 HOURS AS NEEDED FOR ANXIETY 05/19/16  Yes Roma Schanz R, DO  diclofenac sodium (VOLTAREN) 1 % GEL APPLY 2 G TOPICALLY 4 (FOUR) TIMES DAILY. Patient taking differently: Apply 2 g topically as needed (pain).  02/05/15  Yes Roma Schanz R, DO  estrogen, conjugated,-medroxyprogesterone  (PREMPRO) 0.3-1.5 MG tablet Take 1 tablet by mouth daily. Patient taking differently: Take 1 tablet by mouth at bedtime.  09/05/15  Yes Roma Schanz R, DO  temazepam (RESTORIL) 15 MG capsule 1-2 po qhs prn Patient taking differently: Take 15 mg by mouth at bedtime as needed for sleep. 1-2 po qhs prn 06/13/16  Yes Lowne Lyndal Pulley R, DO  traZODone (DESYREL) 50 MG tablet Take 1 tablet (50 mg total) by mouth at bedtime. 06/13/16  Yes Ann Held, DO  aspirin EC 325 MG tablet Take 1 tablet (325 mg total) by mouth 2 (two) times daily. Patient not taking: Reported on 11/28/2018 03/21/16   Leighton Parody, PA-C  benzonatate (TESSALON PERLES) 100 MG capsule Take 1-2 capsules (100-200 mg total) by mouth 3 (three) times daily as needed for cough. Patient not taking: Reported on 11/28/2018 07/09/16   Smiley Houseman, MD  cetirizine (ZYRTEC ALLERGY) 10 MG tablet Take 1 tablet (10 mg total) by mouth daily. Patient not taking: Reported on 11/28/2018 09/07/18   Blanchie Dessert, MD  cyclobenzaprine (FLEXERIL) 5 MG tablet Take 1 tablet (5 mg total) by mouth 3 (three) times daily. Patient not taking: Reported on 11/28/2018 04/18/16   Carollee Herter, Alferd Apa, DO  doxycycline (VIBRA-TABS) 100 MG tablet Take 1 tablet (100 mg total) by mouth 2 (two) times daily. Patient not taking: Reported on 11/28/2018 06/13/16   Carollee Herter, Alferd Apa, DO  fluticasone [facility]) 50 MCG/ACT nasal spray Place 2 sprays into both nostrils daily. Patient not taking: Reported on 11/28/2018 06/03/16   Saguier, Percell Miller, PA-C  hydrochlorothiazide (HYDRODIURIL) 25 MG tablet Take 1 tablet (25 mg total) by mouth daily. Patient not taking: Reported on 11/28/2018 11/23/15   Carollee Herter, Alferd Apa, DO  hydrocortisone cream 1 % Apply to affected area 2 times daily Patient not taking: Reported on 11/28/2018 10/11/15   Hedges, Dellis Filbert, PA-C  mometasone (NASONEX) 50 MCG/ACT nasal spray Place 2 sprays into the nose daily. Patient not taking:  Reported on 11/28/2018 06/13/16   Carollee Herter, Kendrick Fries R, DO  ondansetron (ZOFRAN) 4 MG tablet Take 1 tablet (4 mg total) by mouth every 8 (eight) hours as needed for nausea or vomiting. Patient not taking: Reported on 11/28/2018 10/07/16   Wallene Huh, DPM  ondansetron (ZOFRAN) 4 MG tablet Take 1 tablet (4 mg total) by mouth every 8 (eight) hours as needed for nausea or vomiting. Patient not taking: Reported on 11/28/2018 10/23/16   Wallene Huh, DPM  oseltamivir (TAMIFLU) 75 MG capsule Take 1 capsule (75 mg total) by mouth 2 (two) times daily. Take for 5 days Patient not taking: Reported on 11/28/2018 07/07/16  Carollee Herter, Yvonne R, DO  polyethylene glycol [facility]) packet Take 17 g by mouth daily. For constipation Patient not taking: Reported on 11/28/2018 07/09/16   Smiley Houseman, MD  ranitidine (ZANTAC) 150 MG tablet TAKE 1 TABLET BY MOUTH 2 (TWO) TIMES DAILY. Patient not taking: TAKE 1 TABLET BY MOUTH 2 (TWO) TIMES DAILY. 09/01/16   Mosie Lukes, MD  spironolactone (ALDACTONE) 25 MG tablet Take 1 tablet (25 mg total) by mouth daily. Patient not taking: Reported on 11/28/2018 07/07/16   Ann Held, DO    Family History Family History  Problem Relation Age of Onset  . Cancer Mother        colon cancer  . Cancer Father        colon cancer  . Cancer Sister 1       died of colon cancer  . Heart disease Neg Hx     Social History Social History   Tobacco Use  . Smoking status: Never Smoker  . Smokeless tobacco: Never Used  Substance Use Topics  . Alcohol use: No  . Drug use: No     Allergies   Aspirin   Review of Systems Review of Systems  All other systems reviewed and are negative.    Physical Exam Updated Vital Signs BP (!) 159/86 (BP Location: Right Arm)   Pulse 77   Resp (!) 9   SpO2 100%   Physical Exam Vitals signs and nursing note reviewed.  Constitutional:      General: She is not in acute distress.    Appearance: She is  well-developed.  HENT:     Head: Normocephalic and atraumatic.  Neck:     Musculoskeletal: Normal range of motion.  Cardiovascular:     Rate and Rhythm: Normal rate and regular rhythm.     Heart sounds: Normal heart sounds.  Pulmonary:     Effort: Pulmonary effort is normal.     Breath sounds: Normal breath sounds.  Abdominal:     General: There is no distension.     Palpations: Abdomen is soft.     Tenderness: There is no abdominal tenderness.  Genitourinary:    Comments: Brown stool.  No gross blood.  Hemoccult negative Musculoskeletal: Normal range of motion.  Skin:    General: Skin is warm and dry.  Neurological:     Mental Status: She is alert and oriented to person, place, and time.  Psychiatric:        Judgment: Judgment normal.      ED Treatments / Results  Labs (all labs ordered are listed, but only abnormal results are displayed) Labs Reviewed  COMPREHENSIVE METABOLIC PANEL - Abnormal; Notable for the following components:      Result Value   Potassium 2.6 (*)    Chloride 91 (*)    CO2 33 (*)    Glucose, Bld 110 (*)    Creatinine, Ser 1.31 (*)    Total Bilirubin 1.4 (*)    GFR calc non Af Amer 41 (*)    GFR calc Af Amer 47 (*)    All other components within normal limits  CBC  POC OCCULT BLOOD, ED  TYPE AND SCREEN    EKG    Radiology No results found.  Procedures Procedures (including critical care time)  Medications Ordered in ED Medications  sodium chloride 0.9 % bolus 1,000 mL (has no administration in time range)  potassium chloride SA (K-DUR) CR tablet 40 mEq (has no administration in  time range)  potassium chloride 10 mEq in 100 mL IVPB (has no administration in time range)     Initial Impression / Assessment and Plan / ED Course  I have reviewed the triage vital signs and the nursing notes.  Pertinent labs & imaging results that were available during my care of the patient were reviewed by me and considered in my medical decision  making (see chart for details).        Hypokalemia noted.  Both oral and IV replacement will be given.  CT imaging pending at this time.  If CT imaging is reassuring I feels that the patient will be stable for discharge from the emergency department with outpatient GI follow-up.  She will need to be discharged home with potassium replacement.  Final Clinical Impressions(s) / ED Diagnoses   Final diagnoses:  None    ED Discharge Orders    None       Jola Schmidt, MD 11/28/18 1819

## 2018-11-28 NOTE — ED Triage Notes (Signed)
Pt here with c/o black tarry stools times 1 week pt states that it was bright red 2 weeks before that , pt also states that she has lost approx 15 pounds

## 2018-11-29 ENCOUNTER — Observation Stay (HOSPITAL_COMMUNITY): Payer: Medicare Other

## 2018-11-29 ENCOUNTER — Inpatient Hospital Stay (HOSPITAL_COMMUNITY): Payer: Medicare Other

## 2018-11-29 ENCOUNTER — Other Ambulatory Visit: Payer: Self-pay

## 2018-11-29 ENCOUNTER — Encounter (HOSPITAL_COMMUNITY): Payer: Self-pay | Admitting: Anesthesiology

## 2018-11-29 DIAGNOSIS — R630 Anorexia: Secondary | ICD-10-CM | POA: Diagnosis not present

## 2018-11-29 DIAGNOSIS — E876 Hypokalemia: Secondary | ICD-10-CM

## 2018-11-29 DIAGNOSIS — K449 Diaphragmatic hernia without obstruction or gangrene: Secondary | ICD-10-CM | POA: Diagnosis present

## 2018-11-29 DIAGNOSIS — S79912A Unspecified injury of left hip, initial encounter: Secondary | ICD-10-CM | POA: Diagnosis not present

## 2018-11-29 DIAGNOSIS — R195 Other fecal abnormalities: Secondary | ICD-10-CM | POA: Diagnosis not present

## 2018-11-29 DIAGNOSIS — R002 Palpitations: Secondary | ICD-10-CM | POA: Diagnosis not present

## 2018-11-29 DIAGNOSIS — F419 Anxiety disorder, unspecified: Secondary | ICD-10-CM | POA: Diagnosis not present

## 2018-11-29 DIAGNOSIS — R9431 Abnormal electrocardiogram [ECG] [EKG]: Secondary | ICD-10-CM

## 2018-11-29 DIAGNOSIS — K810 Acute cholecystitis: Secondary | ICD-10-CM | POA: Diagnosis not present

## 2018-11-29 DIAGNOSIS — K648 Other hemorrhoids: Secondary | ICD-10-CM | POA: Diagnosis not present

## 2018-11-29 DIAGNOSIS — K8020 Calculus of gallbladder without cholecystitis without obstruction: Secondary | ICD-10-CM | POA: Diagnosis not present

## 2018-11-29 DIAGNOSIS — S79911A Unspecified injury of right hip, initial encounter: Secondary | ICD-10-CM | POA: Diagnosis not present

## 2018-11-29 DIAGNOSIS — Z87891 Personal history of nicotine dependence: Secondary | ICD-10-CM | POA: Diagnosis not present

## 2018-11-29 DIAGNOSIS — K8012 Calculus of gallbladder with acute and chronic cholecystitis without obstruction: Secondary | ICD-10-CM | POA: Diagnosis not present

## 2018-11-29 DIAGNOSIS — K635 Polyp of colon: Secondary | ICD-10-CM | POA: Diagnosis not present

## 2018-11-29 DIAGNOSIS — I10 Essential (primary) hypertension: Secondary | ICD-10-CM

## 2018-11-29 DIAGNOSIS — M25551 Pain in right hip: Secondary | ICD-10-CM | POA: Diagnosis not present

## 2018-11-29 DIAGNOSIS — R7989 Other specified abnormal findings of blood chemistry: Secondary | ICD-10-CM

## 2018-11-29 DIAGNOSIS — R55 Syncope and collapse: Secondary | ICD-10-CM

## 2018-11-29 DIAGNOSIS — K219 Gastro-esophageal reflux disease without esophagitis: Secondary | ICD-10-CM | POA: Diagnosis present

## 2018-11-29 DIAGNOSIS — K2970 Gastritis, unspecified, without bleeding: Secondary | ICD-10-CM | POA: Diagnosis present

## 2018-11-29 DIAGNOSIS — Z800 Family history of malignant neoplasm of digestive organs: Secondary | ICD-10-CM | POA: Diagnosis not present

## 2018-11-29 DIAGNOSIS — R740 Nonspecific elevation of levels of transaminase and lactic acid dehydrogenase [LDH]: Secondary | ICD-10-CM | POA: Diagnosis present

## 2018-11-29 DIAGNOSIS — K8010 Calculus of gallbladder with chronic cholecystitis without obstruction: Secondary | ICD-10-CM | POA: Diagnosis not present

## 2018-11-29 DIAGNOSIS — K828 Other specified diseases of gallbladder: Secondary | ICD-10-CM | POA: Diagnosis not present

## 2018-11-29 DIAGNOSIS — K3189 Other diseases of stomach and duodenum: Secondary | ICD-10-CM | POA: Diagnosis not present

## 2018-11-29 DIAGNOSIS — R634 Abnormal weight loss: Secondary | ICD-10-CM | POA: Diagnosis present

## 2018-11-29 DIAGNOSIS — N179 Acute kidney failure, unspecified: Secondary | ICD-10-CM | POA: Diagnosis present

## 2018-11-29 DIAGNOSIS — R945 Abnormal results of liver function studies: Secondary | ICD-10-CM | POA: Diagnosis not present

## 2018-11-29 DIAGNOSIS — D124 Benign neoplasm of descending colon: Secondary | ICD-10-CM | POA: Diagnosis present

## 2018-11-29 DIAGNOSIS — D500 Iron deficiency anemia secondary to blood loss (chronic): Secondary | ICD-10-CM | POA: Diagnosis not present

## 2018-11-29 DIAGNOSIS — Z20828 Contact with and (suspected) exposure to other viral communicable diseases: Secondary | ICD-10-CM | POA: Diagnosis not present

## 2018-11-29 DIAGNOSIS — M25552 Pain in left hip: Secondary | ICD-10-CM | POA: Diagnosis not present

## 2018-11-29 DIAGNOSIS — I493 Ventricular premature depolarization: Secondary | ICD-10-CM | POA: Diagnosis present

## 2018-11-29 DIAGNOSIS — Z8711 Personal history of peptic ulcer disease: Secondary | ICD-10-CM | POA: Diagnosis not present

## 2018-11-29 DIAGNOSIS — K2951 Unspecified chronic gastritis with bleeding: Secondary | ICD-10-CM | POA: Diagnosis not present

## 2018-11-29 DIAGNOSIS — G4700 Insomnia, unspecified: Secondary | ICD-10-CM | POA: Diagnosis not present

## 2018-11-29 DIAGNOSIS — R079 Chest pain, unspecified: Secondary | ICD-10-CM | POA: Diagnosis not present

## 2018-11-29 DIAGNOSIS — I471 Supraventricular tachycardia: Secondary | ICD-10-CM | POA: Diagnosis not present

## 2018-11-29 DIAGNOSIS — K921 Melena: Secondary | ICD-10-CM | POA: Diagnosis not present

## 2018-11-29 DIAGNOSIS — D123 Benign neoplasm of transverse colon: Secondary | ICD-10-CM | POA: Diagnosis not present

## 2018-11-29 DIAGNOSIS — Z96643 Presence of artificial hip joint, bilateral: Secondary | ICD-10-CM | POA: Diagnosis present

## 2018-11-29 DIAGNOSIS — R778 Other specified abnormalities of plasma proteins: Secondary | ICD-10-CM

## 2018-11-29 DIAGNOSIS — D649 Anemia, unspecified: Secondary | ICD-10-CM | POA: Diagnosis present

## 2018-11-29 LAB — T4, FREE: Free T4: 0.99 ng/dL (ref 0.61–1.12)

## 2018-11-29 LAB — LIPID PANEL
Cholesterol: 207 mg/dL — ABNORMAL HIGH (ref 0–200)
HDL: 80 mg/dL (ref >40)
LDL Cholesterol: 113 mg/dL — ABNORMAL HIGH (ref 0–99)
Total CHOL/HDL Ratio: 2.6 RATIO
Triglycerides: 71 mg/dL (ref <150)
VLDL: 14 mg/dL (ref 0–40)

## 2018-11-29 LAB — CBC
HCT: 32.3 % — ABNORMAL LOW (ref 36.0–46.0)
HCT: 32.5 % — ABNORMAL LOW (ref 36.0–46.0)
HCT: 30.6 % — ABNORMAL LOW (ref 36.0–46.0)
Hemoglobin: 10.9 g/dL — ABNORMAL LOW (ref 12.0–15.0)
Hemoglobin: 10.7 g/dL — ABNORMAL LOW (ref 12.0–15.0)
Hemoglobin: 10.3 g/dL — ABNORMAL LOW (ref 12.0–15.0)
MCH: 30.5 pg (ref 26.0–34.0)
MCH: 30.5 pg (ref 26.0–34.0)
MCH: 31.3 pg (ref 26.0–34.0)
MCHC: 33.7 g/dL (ref 30.0–36.0)
MCHC: 32.9 g/dL (ref 30.0–36.0)
MCHC: 33.7 g/dL (ref 30.0–36.0)
MCV: 90.5 fL (ref 80.0–100.0)
MCV: 92.6 fL (ref 80.0–100.0)
MCV: 93 fL (ref 80.0–100.0)
Platelets: 310 10*3/uL (ref 150–400)
Platelets: 275 10*3/uL (ref 150–400)
Platelets: 290 10*3/uL (ref 150–400)
RBC: 3.57 MIL/uL — ABNORMAL LOW (ref 3.87–5.11)
RBC: 3.51 MIL/uL — ABNORMAL LOW (ref 3.87–5.11)
RBC: 3.29 MIL/uL — ABNORMAL LOW (ref 3.87–5.11)
RDW: 14.6 % (ref 11.5–15.5)
RDW: 15.2 % (ref 11.5–15.5)
RDW: 15.3 % (ref 11.5–15.5)
WBC: 10 10*3/uL (ref 4.0–10.5)
WBC: 7.8 10*3/uL (ref 4.0–10.5)
WBC: 7.5 10*3/uL (ref 4.0–10.5)
nRBC: 0 % (ref 0.0–0.2)
nRBC: 0 % (ref 0.0–0.2)
nRBC: 0 % (ref 0.0–0.2)

## 2018-11-29 LAB — BASIC METABOLIC PANEL
Anion gap: 11 (ref 5–15)
Anion gap: 8 (ref 5–15)
Anion gap: 9 (ref 5–15)
BUN: 5 mg/dL — ABNORMAL LOW (ref 8–23)
BUN: <5 mg/dL — ABNORMAL LOW (ref 8–23)
BUN: 5 mg/dL — ABNORMAL LOW (ref 8–23)
CO2: 26 mmol/L (ref 22–32)
CO2: 27 mmol/L (ref 22–32)
CO2: 24 mmol/L (ref 22–32)
Calcium: 8.3 mg/dL — ABNORMAL LOW (ref 8.9–10.3)
Calcium: 8.6 mg/dL — ABNORMAL LOW (ref 8.9–10.3)
Calcium: 8.4 mg/dL — ABNORMAL LOW (ref 8.9–10.3)
Chloride: 104 mmol/L (ref 98–111)
Chloride: 107 mmol/L (ref 98–111)
Chloride: 108 mmol/L (ref 98–111)
Creatinine, Ser: 1.08 mg/dL — ABNORMAL HIGH (ref 0.44–1.00)
Creatinine, Ser: 1.04 mg/dL — ABNORMAL HIGH (ref 0.44–1.00)
Creatinine, Ser: 0.95 mg/dL (ref 0.44–1.00)
GFR calc Af Amer: 60 mL/min — ABNORMAL LOW (ref >60)
GFR calc Af Amer: >60 mL/min (ref >60)
GFR calc Af Amer: >60 mL/min (ref >60)
GFR calc non Af Amer: 52 mL/min — ABNORMAL LOW (ref >60)
GFR calc non Af Amer: 54 mL/min — ABNORMAL LOW (ref >60)
GFR calc non Af Amer: >60 mL/min (ref >60)
Glucose, Bld: 89 mg/dL (ref 70–99)
Glucose, Bld: 107 mg/dL — ABNORMAL HIGH (ref 70–99)
Glucose, Bld: 103 mg/dL — ABNORMAL HIGH (ref 70–99)
Potassium: 2.8 mmol/L — ABNORMAL LOW (ref 3.5–5.1)
Potassium: 3.3 mmol/L — ABNORMAL LOW (ref 3.5–5.1)
Potassium: 3.4 mmol/L — ABNORMAL LOW (ref 3.5–5.1)
Sodium: 141 mmol/L (ref 135–145)
Sodium: 142 mmol/L (ref 135–145)
Sodium: 141 mmol/L (ref 135–145)

## 2018-11-29 LAB — MAGNESIUM: Magnesium: 2.2 mg/dL (ref 1.7–2.4)

## 2018-11-29 LAB — HEPATIC FUNCTION PANEL
ALT: 64 U/L — ABNORMAL HIGH (ref 0–44)
AST: 161 U/L — ABNORMAL HIGH (ref 15–41)
Albumin: 3.2 g/dL — ABNORMAL LOW (ref 3.5–5.0)
Alkaline Phosphatase: 98 U/L (ref 38–126)
Bilirubin, Direct: 0.3 mg/dL — ABNORMAL HIGH (ref 0.0–0.2)
Indirect Bilirubin: 1 mg/dL — ABNORMAL HIGH (ref 0.3–0.9)
Total Bilirubin: 1.3 mg/dL — ABNORMAL HIGH (ref 0.3–1.2)
Total Protein: 6.2 g/dL — ABNORMAL LOW (ref 6.5–8.1)

## 2018-11-29 LAB — TSH: TSH: 0.285 u[IU]/mL — ABNORMAL LOW (ref 0.350–4.500)

## 2018-11-29 LAB — TROPONIN I
Troponin I: 0.1 ng/mL (ref <0.03)
Troponin I: 0.05 ng/mL (ref <0.03)

## 2018-11-29 LAB — APTT: aPTT: 32 seconds (ref 24–36)

## 2018-11-29 LAB — ECHOCARDIOGRAM LIMITED
Height: 64 in
Weight: 1858.92 oz

## 2018-11-29 LAB — HEMOGLOBIN A1C
Hgb A1c MFr Bld: 5.2 % (ref 4.8–5.6)
Mean Plasma Glucose: 102.54 mg/dL

## 2018-11-29 LAB — PROTIME-INR
INR: 1.1 (ref 0.8–1.2)
Prothrombin Time: 13.7 seconds (ref 11.4–15.2)

## 2018-11-29 LAB — POTASSIUM: Potassium: 2.6 mmol/L — CL (ref 3.5–5.1)

## 2018-11-29 MED ORDER — POTASSIUM CHLORIDE 10 MEQ/100ML IV SOLN
10.0000 meq | INTRAVENOUS | Status: AC
  Administered 2018-11-29 (×2): 10 meq via INTRAVENOUS
  Filled 2018-11-29: qty 100

## 2018-11-29 MED ORDER — NITROGLYCERIN 0.4 MG SL SUBL
0.4000 mg | SUBLINGUAL_TABLET | SUBLINGUAL | Status: DC | PRN
  Administered 2018-11-29 (×2): 0.4 mg via SUBLINGUAL
  Filled 2018-11-29: qty 1

## 2018-11-29 MED ORDER — CARVEDILOL 3.125 MG PO TABS: 3.1250 mg | ORAL_TABLET | Freq: Two times a day (BID) | ORAL | Status: DC

## 2018-11-29 MED ORDER — POTASSIUM CHLORIDE CRYS ER 20 MEQ PO TBCR
20.0000 meq | EXTENDED_RELEASE_TABLET | Freq: Once | ORAL | Status: AC
  Administered 2018-11-29: 20 meq via ORAL
  Filled 2018-11-29: qty 1

## 2018-11-29 MED ORDER — NITROGLYCERIN 0.4 MG SL SUBL
SUBLINGUAL_TABLET | SUBLINGUAL | Status: AC
  Administered 2018-11-29: 0.4 mg
  Filled 2018-11-29: qty 1

## 2018-11-29 MED ORDER — ATORVASTATIN CALCIUM 80 MG PO TABS
80.0000 mg | ORAL_TABLET | Freq: Every day | ORAL | Status: DC
  Administered 2018-11-29: 80 mg via ORAL
  Filled 2018-11-29: qty 1

## 2018-11-29 MED ORDER — POTASSIUM CHLORIDE CRYS ER 20 MEQ PO TBCR: 40.0000 meq | EXTENDED_RELEASE_TABLET | Freq: Once | ORAL | Status: DC

## 2018-11-29 MED ORDER — TECHNETIUM TC 99M MEBROFENIN IV KIT
5.0000 | PACK | Freq: Once | INTRAVENOUS | Status: AC | PRN
  Administered 2018-11-29: 5 via INTRAVENOUS

## 2018-11-29 MED ORDER — PEG 3350-KCL-NA BICARB-NACL 420 G PO SOLR
2000.0000 mL | Freq: Once | ORAL | Status: AC
  Administered 2018-11-29: 18:00:00 2000 mL via ORAL
  Filled 2018-11-29: qty 4000

## 2018-11-29 MED ORDER — POTASSIUM CHLORIDE CRYS ER 20 MEQ PO TBCR
40.0000 meq | EXTENDED_RELEASE_TABLET | Freq: Once | ORAL | Status: AC
  Administered 2018-11-29: 40 meq via ORAL
  Filled 2018-11-29: qty 2

## 2018-11-29 MED ORDER — ONDANSETRON HCL 4 MG/2ML IJ SOLN
4.0000 mg | Freq: Once | INTRAMUSCULAR | Status: AC
  Administered 2018-11-29: 22:00:00 4 mg via INTRAVENOUS
  Filled 2018-11-29: qty 2

## 2018-11-29 MED ORDER — PEG 3350-KCL-NA BICARB-NACL 420 G PO SOLR: 4000.0000 mL | Freq: Once | ORAL | Status: DC

## 2018-11-29 MED ORDER — SODIUM CHLORIDE 0.9 % IV SOLN: INTRAVENOUS | Status: DC

## 2018-11-29 MED ORDER — PEG 3350-KCL-NA BICARB-NACL 420 G PO SOLR: 2000.0000 mL | Freq: Once | ORAL | Status: DC

## 2018-11-29 MED ORDER — POTASSIUM CHLORIDE 10 MEQ/100ML IV SOLN
10.0000 meq | INTRAVENOUS | Status: AC
  Administered 2018-11-29 (×2): 10 meq via INTRAVENOUS
  Filled 2018-11-29 (×2): qty 100

## 2018-11-29 MED ORDER — ALUM & MAG HYDROXIDE-SIMETH 200-200-20 MG/5ML PO SUSP
30.0000 mL | Freq: Four times a day (QID) | ORAL | Status: DC | PRN
  Administered 2018-11-29 – 2018-12-01 (×2): 30 mL via ORAL
  Filled 2018-11-29 (×3): qty 30

## 2018-11-29 MED ORDER — MORPHINE SULFATE (PF) 2 MG/ML IV SOLN
1.0000 mg | INTRAVENOUS | Status: DC | PRN
  Administered 2018-11-29 – 2018-12-02 (×7): 1 mg via INTRAVENOUS
  Filled 2018-11-29 (×7): qty 1

## 2018-11-29 NOTE — Plan of Care (Signed)
  Problem: Elimination: Goal: Will not experience complications related to urinary retention Outcome: Progressing Note: Voiding without difficulty.  No s/s of urinary retention noted.   Problem: Pain Managment: Goal: General experience of comfort will improve Outcome: Progressing Note: Pain controlled with PRN IV morphine.

## 2018-11-29 NOTE — Consult Note (Addendum)
[facility] Surgery Consult Note  Brandi [name] Dec 30, 1945  [id number].    Requesting MD: Antonieta Pert Chief Complaint:  Black tarry stools, weight loss Reason for Consult: cholelithiasis with abnormal HIDA ejection fraction 0%  HPI: Patient is a 73 year old female who presented to the ED 11/25/2018 complaints of black tarry stools x1 and bright red stools before that.  He also noted she had lost about 15 pounds in a 2 week period according to the patient  She was in [place] in Feb, and had a negative COVID test at that time. She has a hx of GERD/gastric ulcer bleeding in the past, mostly in [place].  She has been having chest pain/GERD sx waking her for 2 week prior to admit.  She reports no issues with pain after eating just GERD like symptoms, worse since she stopped taking Zantac.  She also reports she had lost weight, 135 to 116 pounds in the last 2 weeks. She has a significant hx of colon cancer in her family.  She has yearly colonoscopies, no recent EGD.  Work-up in the emergency department showed she was afebrile and vital signs were stable. Creatinine was elevated at 1.31, potassium was 2.6, magnesium 2.0, bilirubin 1.4. ROS: AST 30, ALT 16.  Troponin 0 0.10.  BC 10.0, hemoglobin 12.5, hematocrit 36.9 platelets 352,000.  TSH 2.85.  Hemoglobin A1c 5.2 CT of the abdomen the pelvis with contrast showed some cysts in the right lobe liver.  There is gallbladder wall thickening maximum of 9 mm no convincing gallstones or bile duct dilatation, the pancreas was unremarkable.  No other significant abnormalities.      She was seen in the ED by Dr. Blaine Hamper patient reported that she had some nausea and vomiting earlier in the day.  She complained of some heart burn and chest discomfort she reported a poor appetite and decreased oral intake.  She underwent CT scan noted above and upon returning from CT was hypotensive and reported to be unresponsive.    She was admitted by Medicine with a diagnosis of syncope,  with QT prolongation.  Electrolyte disturbance, possible GI bleed, and diffuse gallbladder wall thickening. Today she underwent abdominal ultrasound which shows gallbladder wall is thickened 14.1 mm suggestive of cholecystitis, no pericholecystic fluid, common bile duct was normal.  There was some sludge and tiny gallstones noted within the gallbladder.  HIDA scan was then obtained.  There was prompt uptake of the contrast and biliary excretion gallbladder activity is visualized consistent with a patent cystic duct however the ejection fraction was calculated at 0%.  Patient's been seen by GI and they plan diagnostic EGD and colonoscopy tomorrow.  Review of Systems  Constitutional: Positive for weight loss (135 to 116 last 2 weeks). Negative for chills and fever.  HENT: Negative.   Eyes: Negative.   Respiratory: Positive for cough (clear sputum) and shortness of breath (DOE). Negative for hemoptysis, sputum production and wheezing.   Cardiovascular: Positive for chest pain (she says she is having pain each PM and attributes it to GERD) and palpitations. Negative for orthopnea, claudication, leg swelling and PND.  Gastrointestinal: Positive for blood in stool, constipation, heartburn, nausea and vomiting. Negative for abdominal pain, diarrhea and melena.  Genitourinary:       Says she has had a strong odor  Musculoskeletal: Positive for joint pain (hip pain with hx of hip replacement). Negative for falls.  Skin: Negative.   Neurological:       Syncopal episode in ED  Endo/Heme/Allergies:  Negative.   Psychiatric/Behavioral: Positive for depression. The patient is nervous/anxious.     Family History  Problem Relation Age of Onset  . Cancer Mother        colon cancer  . Cancer Father        colon cancer  . Cancer Sister 32       died of colon cancer  . Heart disease Neg Hx     Past Medical History:  Diagnosis Date  . Anxiety    takes Valium daily as needed  . Chronic back pain   .  Chronic back pain   . Complication of anesthesia 1980s   "I stopped breathing on the table; I was gettin my wisdom teeth extracted"  . Constipation   . Depression    takes Lexapro daily  . Dysrhythmia    pt. reports that her heart skips sometimes   . GERD (gastroesophageal reflux disease)    takes Zantac daily  . History of blood transfusion    no abnormal reaction noted  . History of colon polyps    benign  . History of gastric ulcer   . History of GI bleed   . History of hiatal hernia   . History of stress test    done in [place]- done in  1992, told that it was normal.   . Hypertension   . Insomnia    takes Restoril and Trazodone nightly as needed  . Joint pain   . Joint swelling   . Nocturia   . Osteoarthritis   . Peripheral edema    occasionally but never been on fluid pill    Past Surgical History:  Procedure Laterality Date  . BACK SURGERY    . COLONOSCOPY    . ESOPHAGOGASTRODUODENOSCOPY N/A 06/16/2014   Procedure: ESOPHAGOGASTRODUODENOSCOPY (EGD);  Surgeon: Missy Sabins, MD;  [facility];  Service: Endoscopy;  Laterality: N/A;  . JOINT REPLACEMENT    . LUMBAR FUSION    . SHOULDER SURGERY Left   . TOE SURGERY Right    "big toe was coming off"  . TOE SURGERY Left    "bone spur"  . TOTAL HIP ARTHROPLASTY Left 02/26/2015   Procedure: TOTAL HIP ARTHROPLASTY ANTERIOR APPROACH;  Surgeon: Frederik Pear, MD;  Location: [facility];  Service: Orthopedics;  Laterality: Left;  . TOTAL HIP ARTHROPLASTY Right 03/21/2016   Procedure: TOTAL HIP ARTHROPLASTY ANTERIOR APPROACH;  Surgeon: Frederik Pear, MD;  Location: [facility];  Service: Orthopedics;  Laterality: Right;  . WISDOM TOOTH EXTRACTION  1980s    Social History:  reports that she has never smoked. She has never used smokeless tobacco. She reports that she does not drink alcohol or use drugs. 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 Aspirin Other (See Comments)    Stomach ulcer, now inactive, OK with low dose ASA    Medications Prior to Admission  Medication Sig Dispense Refill  . diazepam (VALIUM) 10 MG tablet TAKE 1 TABLET BY MOUTH EVERY 12 HOURS AS NEEDED FOR ANXIETY (Patient taking differently: Take 10 mg by mouth every 12 (twelve) hours as needed for anxiety. TAKE 1 TABLET BY MOUTH EVERY 12 HOURS AS NEEDED FOR ANXIETY) 30 tablet 1  . diclofenac sodium (VOLTAREN) 1 % GEL APPLY 2 G TOPICALLY 4 (FOUR) TIMES DAILY. (Patient taking differently: Apply 2 g topically as needed (pain). )  100 g 3  . estrogen, conjugated,-medroxyprogesterone (PREMPRO) 0.3-1.5 MG tablet Take 1 tablet by mouth daily. (Patient taking differently: Take 1 tablet by mouth at bedtime. ) 28 tablet 3  . temazepam (RESTORIL) 15 MG capsule 1-2 po qhs prn (Patient taking differently: Take 15 mg by mouth at bedtime as needed for sleep. 1-2 po qhs prn) 60 capsule 0  . traZODone (DESYREL) 50 MG tablet Take 1 tablet (50 mg total) by mouth at bedtime. 30 tablet 0  . aspirin EC 325 MG tablet Take 1 tablet (325 mg total) by mouth 2 (two) times daily. (Patient not taking: Reported on 11/28/2018) 30 tablet 0  . benzonatate (TESSALON PERLES) 100 MG capsule Take 1-2 capsules (100-200 mg total) by mouth 3 (three) times daily as needed for cough. (Patient not taking: Reported on 11/28/2018) 20 capsule 0  . cetirizine (ZYRTEC ALLERGY) 10 MG tablet Take 1 tablet (10 mg total) by mouth daily. (Patient not taking: Reported on 11/28/2018) 30 tablet 1  . cyclobenzaprine (FLEXERIL) 5 MG tablet Take 1 tablet (5 mg total) by mouth 3 (three) times daily. (Patient not taking: Reported on 11/28/2018) 60 tablet 0  . doxycycline (VIBRA-TABS) 100 MG tablet Take 1 tablet (100 mg total) by mouth 2 (two) times daily. (Patient not taking: Reported on 11/28/2018) 20 tablet 0  . fluticasone (FLONASE) 50 MCG/ACT nasal spray Place 2 sprays into both nostrils daily. (Patient not  taking: Reported on 11/28/2018) 16 g 1  . hydrochlorothiazide (HYDRODIURIL) 25 MG tablet Take 1 tablet (25 mg total) by mouth daily. (Patient not taking: Reported on 11/28/2018) 30 tablet 11  . hydrocortisone cream 1 % Apply to affected area 2 times daily (Patient not taking: Reported on 11/28/2018) 15 g 0  . mometasone (NASONEX) 50 MCG/ACT nasal spray Place 2 sprays into the nose daily. (Patient not taking: Reported on 11/28/2018) 17 g 12  . ondansetron (ZOFRAN) 4 MG tablet Take 1 tablet (4 mg total) by mouth every 8 (eight) hours as needed for nausea or vomiting. (Patient not taking: Reported on 11/28/2018) 20 tablet 0  . ondansetron (ZOFRAN) 4 MG tablet Take 1 tablet (4 mg total) by mouth every 8 (eight) hours as needed for nausea or vomiting. (Patient not taking: Reported on 11/28/2018) 20 tablet 0  . oseltamivir (TAMIFLU) 75 MG capsule Take 1 capsule (75 mg total) by mouth 2 (two) times daily. Take for 5 days (Patient not taking: Reported on 11/28/2018) 10 capsule 0  . polyethylene glycol (MIRALAX) packet Take 17 g by mouth daily. For constipation (Patient not taking: Reported on 11/28/2018) 14 each 0  . ranitidine (ZANTAC) 150 MG tablet TAKE 1 TABLET BY MOUTH 2 (TWO) TIMES DAILY. (Patient not taking: TAKE 1 TABLET BY MOUTH 2 (TWO) TIMES DAILY.) 180 tablet 3  . spironolactone (ALDACTONE) 25 MG tablet Take 1 tablet (25 mg total) by mouth daily. (Patient not taking: Reported on 11/28/2018) 90 tablet 1    Blood pressure (!) 143/83, pulse 71, temperature (!) 97.2 F (36.2 C), temperature source Oral, resp. rate 12, height 5\' 4"  (1.626 m), weight 52.7 kg, SpO2 100 %. Physical Exam: Physical Exam Constitutional:      General: She is not in acute distress.    Appearance: Normal appearance. She is not ill-appearing, toxic-appearing or diaphoretic.  HENT:     Head: Normocephalic and atraumatic.     Nose: No congestion.     Mouth/Throat:     Mouth: Mucous membranes are moist.  Eyes:  General: No  scleral icterus.    Comments: Pupils are equal  Neck:     Musculoskeletal: Normal range of motion and neck supple. No neck rigidity or muscular tenderness.     Vascular: No carotid bruit.  Cardiovascular:     Rate and Rhythm: Normal rate and regular rhythm.     Pulses: Normal pulses.     Heart sounds: No murmur.  Pulmonary:     Effort: Pulmonary effort is normal. No respiratory distress.     Breath sounds: Normal breath sounds. No stridor. No wheezing, rhonchi or rales.  Chest:     Chest wall: No tenderness.  Abdominal:     General: Abdomen is flat. There is no distension.     Palpations: Abdomen is soft.     Tenderness: There is no abdominal tenderness. There is no guarding.  Lymphadenopathy:     Cervical: No cervical adenopathy.  Skin:    General: Skin is warm and dry.     Capillary Refill: Capillary refill takes less than 2 seconds.     Coloration: Skin is not jaundiced.  Neurological:     General: No focal deficit present.     Mental Status: She is alert and oriented to person, place, and time.     Cranial Nerves: No cranial nerve deficit.  Psychiatric:        Mood and Affect: Mood normal.        Behavior: Behavior normal.        Thought Content: Thought content normal.        Judgment: Judgment normal.     Results for orders placed or performed during the hospital encounter of 11/28/18 (from the past 48 hour(s))  CBC     Status: None   Collection Time: 11/28/18  3:45 PM  Result Value Ref Range   WBC 10.0 4.0 - 10.5 K/uL   RBC 4.02 3.87 - 5.11 MIL/uL   Hemoglobin 12.5 12.0 - 15.0 g/dL   HCT 36.9 36.0 - 46.0 %   MCV 91.8 80.0 - 100.0 fL   MCH 31.1 26.0 - 34.0 pg   MCHC 33.9 30.0 - 36.0 g/dL   RDW 14.8 11.5 - 15.5 %   Platelets 352 150 - 400 K/uL   nRBC 0.0 0.0 - 0.2 %    Comment: Performed at [facility], [place] [address], [place], [place] [address]  Comprehensive metabolic panel     Status: Abnormal   Collection Time: 11/28/18  3:45 PM  Result Value Ref  Range   Sodium 138 135 - 145 mmol/L   Potassium 2.6 (LL) 3.5 - 5.1 mmol/L    Comment: CRITICAL RESULT CALLED TO, READ BACK BY AND VERIFIED WITH: LEAK, B RN @ 1644 ON 11/28/2018 BY TEMOCHE, H    Chloride 91 (L) 98 - 111 mmol/L   CO2 33 (H) 22 - 32 mmol/L   Glucose, Bld 110 (H) 70 - 99 mg/dL   BUN 9 8 - 23 mg/dL   Creatinine, Ser 1.31 (H) 0.44 - 1.00 mg/dL   Calcium 10.1 8.9 - 10.3 mg/dL   Total Protein 8.1 6.5 - 8.1 g/dL   Albumin 4.3 3.5 - 5.0 g/dL   AST 30 15 - 41 U/L   ALT 16 0 - 44 U/L   Alkaline Phosphatase 55 38 - 126 U/L   Total Bilirubin 1.4 (H) 0.3 - 1.2 mg/dL   GFR calc non Af Amer 41 (L) >60 mL/min   GFR calc Af Amer 47 (  L) >60 mL/min   Anion gap 14 5 - 15    Comment: Performed at [facility] [address], [place], [place] [address]  Magnesium     Status: None   Collection Time: 11/28/18  3:45 PM  Result Value Ref Range   Magnesium 2.0 1.7 - 2.4 mg/dL    Comment: Performed at [facility], [place] [address], [place], [place] [address]  Type and screen [place]     Status: None   Collection Time: 11/28/18  3:47 PM  Result Value Ref Range   ABO/RH(D) O POS    Antibody Screen NEG    Sample Expiration      12/01/2018,2359 Performed at [facility], [place] [address] [place], [place] [address]   POC occult blood, ED     Status: None   Collection Time: 11/28/18  4:11 PM  Result Value Ref Range   Fecal Occult Bld NEGATIVE NEGATIVE  CBG monitoring, ED     Status: Abnormal   Collection Time: 11/28/18  7:12 PM  Result Value Ref Range   Glucose-Capillary 125 (H) 70 - 99 mg/dL  Rapid urine drug screen (hospital performed)     Status: Abnormal   Collection Time: 11/28/18  9:05 PM  Result Value Ref Range   Opiates NONE DETECTED NONE DETECTED   Cocaine NONE DETECTED NONE DETECTED   Benzodiazepines POSITIVE (A) NONE DETECTED   Amphetamines NONE DETECTED NONE DETECTED   Tetrahydrocannabinol NONE DETECTED NONE DETECTED   Barbiturates  NONE DETECTED NONE DETECTED    Comment: (NOTE) DRUG SCREEN FOR MEDICAL PURPOSES ONLY.  IF CONFIRMATION IS NEEDED FOR ANY PURPOSE, NOTIFY LAB WITHIN 5 DAYS. LOWEST DETECTABLE LIMITS FOR URINE DRUG SCREEN Drug Class                     Cutoff (ng/mL) Amphetamine and metabolites    1000 Barbiturate and metabolites    200 Benzodiazepine                 694 Tricyclics and metabolites     300 Opiates and metabolites        300 Cocaine and metabolites        300 THC                            50 Performed at [facility], [place] [address], [place], [place] [address]   Hemoglobin A1c     Status: None   Collection Time: 11/29/18  2:35 AM  Result Value Ref Range   Hgb A1c MFr Bld 5.2 4.8 - 5.6 %    Comment: (NOTE) Pre diabetes:          5.7%-6.4% Diabetes:              >6.4% Glycemic control for   <7.0% adults with diabetes    Mean Plasma Glucose 102.54 mg/dL    Comment: Performed at [facility] [address], [place], [place] [address]  Lipid panel     Status: Abnormal   Collection Time: 11/29/18  2:35 AM  Result Value Ref Range   Cholesterol 207 (H) 0 - 200 mg/dL   Triglycerides 71 <150 mg/dL   HDL 80 >40 mg/dL   Total CHOL/HDL Ratio 2.6 RATIO   VLDL 14 0 - 40 mg/dL   LDL Cholesterol 113 (H) 0 - 99 mg/dL    Comment:  Total Cholesterol/HDL:CHD Risk Coronary Heart Disease Risk Table                     Men   Women  1/2 Average Risk   3.4   3.3  Average Risk       5.0   4.4  2 X Average Risk   9.6   7.1  3 X Average Risk  23.4   11.0        Use the calculated Patient Ratio above and the CHD Risk Table to determine the patient's CHD Risk.        ATP III CLASSIFICATION (LDL):  <100     mg/dL   Optimal  100-129  mg/dL   Near or Above                    Optimal  130-159  mg/dL   Borderline  160-189  mg/dL   High  >190     mg/dL   Very High Performed at [facility] [address], [place], [place] [address]   Troponin I - Now Then Q6H      Status: Abnormal   Collection Time: 11/29/18  2:35 AM  Result Value Ref Range   Troponin I 0.10 (HH) <0.03 ng/mL    Comment: CRITICAL RESULT CALLED TO, READ BACK BY AND VERIFIED WITH: RODGERS,D RN 11/29/2018 0348 JORDANS Performed at [facility], [place] [address] [place], [place] [address]   TSH     Status: Abnormal   Collection Time: 11/29/18  2:35 AM  Result Value Ref Range   TSH 0.285 (L) 0.350 - 4.500 uIU/mL    Comment: Performed by a 3rd Generation assay with a functional sensitivity of <=0.01 uIU/mL. Performed at [facility], [place] [address], [place], [place] [address]   CBC     Status: Abnormal   Collection Time: 11/29/18  2:35 AM  Result Value Ref Range   WBC 10.0 4.0 - 10.5 K/uL   RBC 3.57 (L) 3.87 - 5.11 MIL/uL   Hemoglobin 10.9 (L) 12.0 - 15.0 g/dL   HCT 32.3 (L) 36.0 - 46.0 %   MCV 90.5 80.0 - 100.0 fL   MCH 30.5 26.0 - 34.0 pg   MCHC 33.7 30.0 - 36.0 g/dL   RDW 14.6 11.5 - 15.5 %   Platelets 310 150 - 400 K/uL   nRBC 0.0 0.0 - 0.2 %    Comment: Performed at [facility], [place] [address] [place], [place] [address]  Protime-INR     Status: None   Collection Time: 11/29/18  2:35 AM  Result Value Ref Range   Prothrombin Time 13.7 11.4 - 15.2 seconds   INR 1.1 0.8 - 1.2    Comment: (NOTE) INR goal varies based on device and disease states. Performed at [facility], [place] [address] [place], [place] [address]   APTT     Status: None   Collection Time: 11/29/18  2:35 AM  Result Value Ref Range   aPTT 32 24 - 36 seconds    Comment: Performed at [facility] [address] [place], [place] [address]  Potassium     Status: Abnormal   Collection Time: 11/29/18  2:35 AM  Result Value Ref Range   Potassium 2.6 (LL) 3.5 - 5.1 mmol/L    Comment: CRITICAL RESULT CALLED TO, READ BACK BY AND VERIFIED WITH: FRANCO,D RN 11/29/2018 [place]  at [facility], [place] [address], [place], [place] [address]   Basic metabolic  panel     Status: Abnormal   Collection Time: 11/29/18  4:24 AM  Result Value Ref Range   Sodium 141 135 - 145 mmol/L   Potassium 2.8 (L) 3.5 - 5.1 mmol/L   Chloride 104 98 - 111 mmol/L   CO2 26 22 - 32 mmol/L   Glucose, Bld 89 70 - 99 mg/dL   BUN 5 (L) 8 - 23 mg/dL   Creatinine, Ser 1.08 (H) 0.44 - 1.00 mg/dL   Calcium 8.3 (L) 8.9 - 10.3 mg/dL   GFR calc non Af Amer 52 (L) >60 mL/min   GFR calc Af Amer 60 (L) >60 mL/min   Anion gap 11 5 - 15    Comment: Performed at [facility] [address], [place], [place] [address]  Troponin I - Now Then Q6H     Status: Abnormal   Collection Time: 11/29/18  9:13 AM  Result Value Ref Range   Troponin I 0.05 (HH) <0.03 ng/mL    Comment: CRITICAL VALUE NOTED.  VALUE IS CONSISTENT WITH PREVIOUSLY REPORTED AND CALLED VALUE. Performed at [facility], [place] [address], [place], [place] [address]   CBC     Status: Abnormal   Collection Time: 11/29/18  9:13 AM  Result Value Ref Range   WBC 7.8 4.0 - 10.5 K/uL   RBC 3.51 (L) 3.87 - 5.11 MIL/uL   Hemoglobin 10.7 (L) 12.0 - 15.0 g/dL   HCT 32.5 (L) 36.0 - 46.0 %   MCV 92.6 80.0 - 100.0 fL   MCH 30.5 26.0 - 34.0 pg   MCHC 32.9 30.0 - 36.0 g/dL   RDW 15.2 11.5 - 15.5 %   Platelets 275 150 - 400 K/uL   nRBC 0.0 0.0 - 0.2 %    Comment: Performed at [facility], [place] [address] [place], [place] [address]  Hepatic function panel     Status: Abnormal   Collection Time: 11/29/18  9:13 AM  Result Value Ref Range   Total Protein 6.2 (L) 6.5 - 8.1 g/dL   Albumin 3.2 (L) 3.5 - 5.0 g/dL   AST 161 (H) 15 - 41 U/L   ALT 64 (H) 0 - 44 U/L   Alkaline Phosphatase 98 38 - 126 U/L   Total Bilirubin 1.3 (H) 0.3 - 1.2 mg/dL   Bilirubin, Direct 0.3 (H) 0.0 - 0.2 mg/dL   Indirect Bilirubin 1.0 (H) 0.3 - 0.9 mg/dL    Comment: Performed at [facility] [address] [place],  [address]  Magnesium     Status: None   Collection Time: 11/29/18  9:13 AM  Result Value Ref Range    Magnesium 2.2 1.7 - 2.4 mg/dL    Comment: Performed at [facility] [address], [place], [place] [address]  CBC     Status: Abnormal   Collection Time: 11/29/18  3:00 PM  Result Value Ref Range   WBC 7.5 4.0 - 10.5 K/uL   RBC 3.29 (L) 3.87 - 5.11 MIL/uL   Hemoglobin 10.3 (L) 12.0 - 15.0 g/dL   HCT 30.6 (L) 36.0 - 46.0 %   MCV 93.0 80.0 - 100.0 fL   MCH 31.3 26.0 - 34.0 pg   MCHC 33.7 30.0 - 36.0 g/dL   RDW 15.3 11.5 - 15.5 %   Platelets 290 150 - 400 K/uL  nRBC 0.0 0.0 - 0.2 %    Comment: Performed at [facility], [place] [address], [place], [place] [address]  Basic metabolic panel     Status: Abnormal   Collection Time: 11/29/18  3:00 PM  Result Value Ref Range   Sodium 142 135 - 145 mmol/L   Potassium 3.3 (L) 3.5 - 5.1 mmol/L   Chloride 107 98 - 111 mmol/L   CO2 27 22 - 32 mmol/L   Glucose, Bld 107 (H) 70 - 99 mg/dL   BUN <5 (L) 8 - 23 mg/dL   Creatinine, Ser 1.04 (H) 0.44 - 1.00 mg/dL   Calcium 8.6 (L) 8.9 - 10.3 mg/dL   GFR calc non Af Amer 54 (L) >60 mL/min   GFR calc Af Amer >60 >60 mL/min   Anion gap 8 5 - 15    Comment: Performed at [facility] [address], [place], [place] [address]  T4, free     Status: None   Collection Time: 11/29/18  3:00 PM  Result Value Ref Range   Free T4 0.99 0.61 - 1.12 ng/dL    Comment: Please note change in reference range. (NOTE) Biotin ingestion may interfere with free T4 tests. If the results are inconsistent with the TSH level, previous test results, or the clinical presentation, then consider biotin interference. If needed, order repeat testing after stopping biotin. Performed at [facility], [place] [address], [place],  [address]    Ct Abdomen Pelvis W Contrast  Result Date: 11/28/2018 CLINICAL DATA:  GI bleed. Weight loss abdominal pain. Black tarry stools for 1 week. Bright red blood per rectum 2 weeks before that. EXAM: CT ABDOMEN AND PELVIS WITH CONTRAST TECHNIQUE: Multidetector CT  imaging of the abdomen and pelvis was performed using the standard protocol following bolus administration of intravenous contrast. CONTRAST:  58mL OMNIPAQUE IOHEXOL 300 MG/ML  SOLN COMPARISON:  CT, 06/15/2014 FINDINGS: Lower chest: Lung bases essentially clear. Heart normal size. Pectus excavatum deformity. Hepatobiliary: 2, 4 mm low-density lesions in the right lobe consistent with cysts. Focal fat adjacent to the falciform ligament. No other liver abnormality. There is diffuse gallbladder wall thickening, maximum of 9 mm. No convincing gallstone. No bile duct dilation. Pancreas: Unremarkable. No pancreatic ductal dilatation or surrounding inflammatory changes. Spleen: Normal in size without focal abnormality. Adrenals/Urinary Tract: No adrenal masses. Kidneys normal in overall size, orientation and position. 2.6 cm midpole right renal cyst. Small low-density lesion in the anterior midpole the left kidney, also likely a cyst but not fully characterized. No other renal masses or lesions. No hydronephrosis. Ureters normal in course and in caliber. Bladder is unremarkable, but partly obscured by artifact from bilateral hip prostheses. Stomach/Bowel: Mild haziness along wall of the distal esophagus at the gastroesophageal junction. This is nonspecific. Stomach is unremarkable. Small bowel and colon are normal in caliber. No evidence of a mass. No wall thickening or inflammation. Normal appendix visualized. Vascular/Lymphatic: No significant vascular findings are present. No enlarged abdominal or pelvic lymph nodes. Reproductive: Uterus is heterogeneous. 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 Other: No abdominal wall hernia.  No ascites. Musculoskeletal: Status post L4-L5 fusion. Status post bilateral total hip arthroplasties. No fracture or acute finding. IMPRESSION: 1. Diffuse gallbladder wall thickening, maximum of 9 mm. The etiology of this is unclear. Recommend further assessment  with limited right upper quadrant ultrasound. 2. Mild haziness along the wall of the lower esophagus at the gastroesophageal junction.  This may reflect inflammation. Consider follow-up upper endoscopy for further assessment. 3. No evidence of a bowel mass, obstruction or inflammation. No other findings to account for GI bleeding. Electronically Signed   By: Lajean Manes M.D.   On: 11/28/2018 19:26   Nm Hepato W/eject Fract  Result Date: 11/29/2018 CLINICAL DATA:  Loss of appetite.  Weight loss. EXAM: NUCLEAR MEDICINE HEPATOBILIARY IMAGING WITH GALLBLADDER EF TECHNIQUE: Sequential images of the abdomen were obtained out to 60 minutes following intravenous administration of radiopharmaceutical. After oral ingestion of Ensure, gallbladder ejection fraction was determined. At 60 min, normal ejection fraction is greater than 33%. RADIOPHARMACEUTICALS:  5.1 mCi Tc-71m  Choletec IV COMPARISON:  Ultrasound 11/29/2018. FINDINGS: Prompt uptake and biliary excretion of activity by the liver is seen. Gallbladder activity is visualized, consistent with patency of cystic duct. Biliary activity passes into small bowel, consistent with patent common bile duct. Calculated gallbladder ejection fraction is 0%. (Normal gallbladder ejection fraction with Ensure is greater than 33%.) IMPRESSION: 1.  Abnormal gallbladder ejection fraction at 0%. 2. Liver, biliary system, gallbladder, and bowel visualize normally. Electronically Signed   By: Marcello Moores  Register   On: 11/29/2018 14:39   Dg Hips Bilat With Pelvis 3-4 Views  Result Date: 11/29/2018 CLINICAL DATA:  Right greater than left hip pain after a fall 4 days ago. EXAM: DG HIP (WITH OR WITHOUT PELVIS) 3-4V BILAT COMPARISON:  CT abdomen and pelvis 11/28/2018. Right hip radiographs 09/27/2015. 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 No acute fracture or dislocation is identified. There is no lucency about the femoral stems to suggest loosening or infection. A  prominent subchondral cyst superiorly in the left acetabulum is unchanged. Excreted IV contrast is noted in the bladder, and postsurgical changes are noted in the lower lumbar spine. 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 Electronically Signed   By: Logan Bores M.D.   On: 11/29/2018 09:49   US Abdomen Limited Ruq  Result Date: 11/29/2018 CLINICAL DATA:  Abnormal gallbladder on CT. EXAM: ULTRASOUND ABDOMEN LIMITED RIGHT UPPER QUADRANT COMPARISON:  CT 11/28/2018. FINDINGS: Gallbladder: Multiple tiny gallstones are noted. Gallbladder sludge noted. Gallbladder wall thickness is 14.1 mm suggesting cholecystitis. Negative Murphy sign. No pericholecystic fluid collection. Common bile duct: Diameter: 6.3 mm Liver: No focal lesion identified. Within normal limits in parenchymal echogenicity. Portal vein is patent on color Doppler imaging with normal direction of blood flow towards the liver. IMPRESSION: Multiple tiny gallstones noted. Gallbladder sludge noted. Gallbladder wall is thickened at 14.1 mm suggesting cholecystitis. No biliary distention. Electronically Signed   By: Marcello Moores  Register   On: 11/29/2018 06:02   . sodium chloride 100 mL/hr at 11/29/18 0160  . sodium chloride     Anti-infectives (From admission, onward)   None       Assessment/Plan Acute kidney injury Syncope/mild troponin elevation/QTc prolongation Hypokalemia - being replaced Hx hypertension Suppressed TSH Hx gastric ulcers/GERD Strong family hx of colon cancer Hx anxiety/depression/insomnia Remote hx of tobacco use COVID - neg 07/2018 - current test pending   Dark stools/possible rectal bleeding Cholelithiasis/gallbladder wall thickening/abnormal gallbladder function Abnormal LFTs  FEN: IV fluids/clear liquids/n.p.o. after midnight ID: None: DVT: SCDs. Follow-up: TBD POC: Dolata,[name] Spouse [phone]  [phone]  [name],[name] Sister   [phone]     Plan:  She is not tender on exam, she does not have sx of cholecystitis, WBC is normal. He primary symptoms are GERD, with weight loss.  She has a strong family hx of colon cancer,  both parents  and a sister with hx of colon cancer.  Recheck labs in AM.  We will follow and let Medicine continue work up, EGD and colonoscopy.  She has not started her prep at [facility] PA-C  [facility] [phone]  11/29/2018 4:17 PM

## 2018-11-29 NOTE — Anesthesia Preprocedure Evaluation (Addendum)
Anesthesia Evaluation  Patient identified by MRN, date of birth, ID band Patient awake    Reviewed: Allergy & Precautions, NPO status , Patient's Chart, lab work & pertinent test results  Airway Mallampati: I  TM Distance: >3 FB Neck ROM: Full    Dental  (+) Dental Advisory Given, Partial Upper   Pulmonary neg pulmonary ROS,    breath sounds clear to auscultation       Cardiovascular hypertension, Pt. on medications  Rhythm:Regular Rate:Normal     Neuro/Psych Anxiety Depression negative neurological ROS     GI/Hepatic Neg liver ROS, hiatal hernia, GERD  Medicated,  Endo/Other    Renal/GU      Musculoskeletal   Abdominal Normal abdominal exam  (+)   Peds  Hematology   Anesthesia Other Findings   Reproductive/Obstetrics                            Anesthesia Physical Anesthesia Plan  ASA: III  Anesthesia Plan: MAC   Post-op Pain Management:    Induction: Intravenous  PONV Risk Score and Plan: 2 and Propofol infusion and Treatment may vary due to age or medical condition  Airway Management Planned: Natural Airway and Nasal Cannula  Additional Equipment: None  Intra-op Plan:   Post-operative Plan:   Informed Consent: I have reviewed the patients History and Physical, chart, labs and discussed the procedure including the risks, benefits and alternatives for the proposed anesthesia with the patient or authorized representative who has indicated his/her understanding and acceptance.       Plan Discussed with: CRNA  Anesthesia Plan Comments:        Anesthesia Quick Evaluation

## 2018-11-29 NOTE — Consult Note (Signed)
[facility] Consult  Referring Provider: Maren [facility] Primary Care Physician:  Vincente Liberty, MD Primary Gastroenterologist: Dr.Schooler  Reason for Consultation:  Dark stools, blood in stool, abnormal LFTs  HPI: Brandi [name] is a 73 y.o. female was admitted yesterday complains of intermittent dark stools for the past 2 weeks.  This was associated with epigastric and lower sternal discomfort and severe heartburn.  Patient has been taking Zantac(despite being aware of FDA recall) for acid reflux with minimal relief of symptoms.  She has had 19 pounds of unintentional weight loss in the last 2 weeks.  In the last 3 months she has also noted intermittent episodes of bright red blood per rectum which has been small in amount and worse with constipation.  Patient is always had irregular bowel movements, complains of constipation and having bowel movement likely 2-3 times a week.  Her father died from colon cancer at 46 her mother had colon cancer after 47 and a sister had colon cancer at 63.  Her last colonoscopy was in [place] in 2015, last EGD was in 2016 which was normal except evidence of hiatal hernia. Patient denies difficulty swallowing or pain on swallowing. In the ER after getting a CAT scan she had a documented episode of syncope, possible contrast allergy related reaction.  Patient states she last traveled to [place] in February and denies recent fever, chills or rigors. She is not on any antiplatelets and denies use of NSAIDs.  Past Medical History:  Diagnosis Date  . Anxiety    takes Valium daily as needed  . Chronic back pain   . Chronic back pain   . Complication of anesthesia 1980s   "I stopped breathing on the table; I was gettin my wisdom teeth extracted"  . Constipation   . Depression    takes Lexapro daily  . Dysrhythmia    pt. reports that her heart skips sometimes   . GERD (gastroesophageal reflux disease)    takes Zantac daily  .  History of blood transfusion    no abnormal reaction noted  . History of colon polyps    benign  . History of gastric ulcer   . History of GI bleed   . History of hiatal hernia   . History of stress test    done in [place]- done in  1992, told that it was normal.   . Hypertension   . Insomnia    takes Restoril and Trazodone nightly as needed  . Joint pain   . Joint swelling   . Nocturia   . Osteoarthritis   . Peripheral edema    occasionally but never been on fluid pill    Past Surgical History:  Procedure Laterality Date  . BACK SURGERY    . COLONOSCOPY    . ESOPHAGOGASTRODUODENOSCOPY N/A 06/16/2014   Procedure: ESOPHAGOGASTRODUODENOSCOPY (EGD);  Surgeon: Missy Sabins, MD;  Location: [facility] ENDOSCOPY;  Service: Endoscopy;  Laterality: N/A;  . JOINT REPLACEMENT    . LUMBAR FUSION    . SHOULDER SURGERY Left   . TOE SURGERY Right    "big toe was coming off"  . TOE SURGERY Left    "bone spur"  . TOTAL HIP ARTHROPLASTY Left 02/26/2015   Procedure: TOTAL HIP ARTHROPLASTY ANTERIOR APPROACH;  Surgeon: Frederik Pear, MD;  Location: [place];  Service: Orthopedics;  Laterality: Left;  . TOTAL HIP ARTHROPLASTY Right 03/21/2016   Procedure: TOTAL HIP ARTHROPLASTY ANTERIOR APPROACH;  Surgeon: Frederik Pear, MD;  Location: [facility]  OR;  Service: Orthopedics;  Laterality: Right;  . WISDOM TOOTH EXTRACTION  1980s    Prior to Admission medications   Medication Sig Start Date End Date Taking? Authorizing Provider  diazepam (VALIUM) 10 MG tablet TAKE 1 TABLET BY MOUTH EVERY 12 HOURS AS NEEDED FOR ANXIETY Patient taking differently: Take 10 mg by mouth every 12 (twelve) hours as needed for anxiety. TAKE 1 TABLET BY MOUTH EVERY 12 HOURS AS NEEDED FOR ANXIETY 05/19/16  Yes Roma Schanz R, DO  diclofenac sodium (VOLTAREN) 1 % GEL APPLY 2 G TOPICALLY 4 (FOUR) TIMES DAILY. Patient taking differently: Apply 2 g topically as needed (pain).  02/05/15  Yes Roma Schanz R, DO  estrogen,  conjugated,-medroxyprogesterone (PREMPRO) 0.3-1.5 MG tablet Take 1 tablet by mouth daily. Patient taking differently: Take 1 tablet by mouth at bedtime.  09/05/15  Yes Roma Schanz R, DO  temazepam (RESTORIL) 15 MG capsule 1-2 po qhs prn Patient taking differently: Take 15 mg by mouth at bedtime as needed for sleep. 1-2 po qhs prn 06/13/16  Yes Lowne Lyndal Pulley R, DO  traZODone (DESYREL) 50 MG tablet Take 1 tablet (50 mg total) by mouth at bedtime. 06/13/16  Yes Ann Held, DO  aspirin EC 325 MG tablet Take 1 tablet (325 mg total) by mouth 2 (two) times daily. Patient not taking: Reported on 11/28/2018 03/21/16   Leighton Parody, PA-C  benzonatate (TESSALON PERLES) 100 MG capsule Take 1-2 capsules (100-200 mg total) by mouth 3 (three) times daily as needed for cough. Patient not taking: Reported on 11/28/2018 07/09/16   Smiley Houseman, MD  cetirizine (ZYRTEC ALLERGY) 10 MG tablet Take 1 tablet (10 mg total) by mouth daily. Patient not taking: Reported on 11/28/2018 09/07/18   Blanchie Dessert, MD  cyclobenzaprine (FLEXERIL) 5 MG tablet Take 1 tablet (5 mg total) by mouth 3 (three) times daily. Patient not taking: Reported on 11/28/2018 04/18/16   Carollee Herter, Alferd Apa, DO  doxycycline (VIBRA-TABS) 100 MG tablet Take 1 tablet (100 mg total) by mouth 2 (two) times daily. Patient not taking: Reported on 11/28/2018 06/13/16   Carollee Herter, Alferd Apa, DO  fluticasone [facility]) 50 MCG/ACT nasal spray Place 2 sprays into both nostrils daily. Patient not taking: Reported on 11/28/2018 06/03/16   Saguier, Percell Miller, PA-C  hydrochlorothiazide (HYDRODIURIL) 25 MG tablet Take 1 tablet (25 mg total) by mouth daily. Patient not taking: Reported on 11/28/2018 11/23/15   Carollee Herter, Alferd Apa, DO  hydrocortisone cream 1 % Apply to affected area 2 times daily Patient not taking: Reported on 11/28/2018 10/11/15   Hedges, Dellis Filbert, PA-C  mometasone (NASONEX) 50 MCG/ACT nasal spray Place 2 sprays into the  nose daily. Patient not taking: Reported on 11/28/2018 06/13/16   Carollee Herter, Kendrick Fries R, DO  ondansetron (ZOFRAN) 4 MG tablet Take 1 tablet (4 mg total) by mouth every 8 (eight) hours as needed for nausea or vomiting. Patient not taking: Reported on 11/28/2018 10/07/16   Wallene Huh, DPM  ondansetron (ZOFRAN) 4 MG tablet Take 1 tablet (4 mg total) by mouth every 8 (eight) hours as needed for nausea or vomiting. Patient not taking: Reported on 11/28/2018 10/23/16   Wallene Huh, DPM  oseltamivir (TAMIFLU) 75 MG capsule Take 1 capsule (75 mg total) by mouth 2 (two) times daily. Take for 5 days Patient not taking: Reported on 11/28/2018 07/07/16   Carollee Herter, Alferd Apa, DO  polyethylene glycol [facility]) packet Take 17 g by mouth  daily. For constipation Patient not taking: Reported on 11/28/2018 07/09/16   Smiley Houseman, MD  ranitidine (ZANTAC) 150 MG tablet TAKE 1 TABLET BY MOUTH 2 (TWO) TIMES DAILY. Patient not taking: TAKE 1 TABLET BY MOUTH 2 (TWO) TIMES DAILY. 09/01/16   Mosie Lukes, MD  spironolactone (ALDACTONE) 25 MG tablet Take 1 tablet (25 mg total) by mouth daily. Patient not taking: Reported on 11/28/2018 07/07/16   Ann Held, DO    Current Facility-Administered Medications  Medication Dose Route Frequency Provider Last Rate Last Dose  . 0.9 %  sodium chloride infusion   Intravenous Continuous Ivor Costa, MD 100 mL/hr at 11/29/18 0728    . acetaminophen (TYLENOL) tablet 650 mg  650 mg Oral Q6H PRN Ivor Costa, MD   650 mg at 11/29/18 3419   Or  . acetaminophen (TYLENOL) suppository 650 mg  650 mg Rectal Q6H PRN Ivor Costa, MD      . atropine 1 MG/10ML injection 0.5 mg  0.5 mg Intravenous PRN Ivor Costa, MD      . diazepam (VALIUM) tablet 10 mg  10 mg Oral Q12H PRN Ivor Costa, MD   10 mg at 11/28/18 2346  . estrogen (conjugated)-medroxyprogesterone (PREMPRO) 0.3-1.5 MG per tablet 1 tablet  1 tablet Oral QHS Ivor Costa, MD      . hydrALAZINE (APRESOLINE) injection 5 mg   5 mg Intravenous Q2H PRN Ivor Costa, MD      . morphine 2 MG/ML injection 1 mg  1 mg Intravenous Q4H PRN Ivor Costa, MD   1 mg at 11/29/18 0447  . nitroGLYCERIN (NITROSTAT) SL tablet 0.4 mg  0.4 mg Sublingual Q5 min PRN Ivor Costa, MD   0.4 mg at 11/29/18 0333  . pantoprazole (PROTONIX) EC tablet 40 mg  40 mg Oral BID Ivor Costa, MD   40 mg at 11/29/18 1004  . polyethylene glycol-electrolytes (NuLYTELY/GoLYTELY) solution 4,000 mL  4,000 mL Oral Once [name], MD      . potassium chloride 10 mEq in 100 mL IVPB  10 mEq Intravenous Q1 Hr x 2 Kc, Ramesh, MD 100 mL/hr at 11/29/18 1415 10 mEq at 11/29/18 1415  . sodium chloride flush (NS) 0.9 % injection 3 mL  3 mL Intravenous Q12H Ivor Costa, MD        Allergies as of 11/28/2018 - Review Complete 11/28/2018  Allergen Reaction Noted  . Aspirin Other (See Comments) 07/02/2012    Family History  Problem Relation Age of Onset  . Cancer Mother        colon cancer  . Cancer Father        colon cancer  . Cancer Sister 97       died of colon cancer  . Heart disease Neg Hx     Social History   Socioeconomic History  . Marital status: Married    Spouse name: Not on file  . Number of children: Not on file  . Years of education: Not on file  . Highest education level: Not on file  Occupational History  . Occupation: retired    Fish farm manager: [place]  . Financial resource strain: Not on file  . Food insecurity    Worry: Not on file    Inability: Not on file  . Transportation needs    Medical: Not on file    Non-medical: Not on file  Tobacco Use  . Smoking status: Never Smoker  . Smokeless tobacco: Never Used  Substance and Sexual Activity  . Alcohol use: No  . Drug use: No  . Sexual activity: Yes    Birth control/protection: Post-menopausal  Lifestyle  . Physical activity    Days per week: Not on file    Minutes per session: Not on file  . Stress: Not on file  Relationships  . Social Herbalist on  phone: Not on file    Gets together: Not on file    Attends religious service: Not on file    Active member of club or organization: Not on file    Attends meetings of clubs or organizations: Not on file    Relationship status: Not on file  . Intimate partner violence    Fear of current or ex partner: Not on file    Emotionally abused: Not on file    Physically abused: Not on file    Forced sexual activity: Not on file  Other Topics Concern  . Not on file  Social History Narrative   Retired from [employer] in [place], exercise - walking   retired Environmental manager, Musician, librarian    Review of Systems: Positive for: GI: Described in detail in HPI.    Gen: anorexia, fatigue, weakness, malaise, involuntary weight loss, Denies any fever, chills, rigors, night sweats, and sleep disorder CV: Denies chest pain, angina, palpitations, syncope, orthopnea, PND, peripheral edema, and claudication. Resp: Denies dyspnea, cough, sputum, wheezing, coughing up blood. GU : Denies urinary burning, blood in urine, urinary frequency, urinary hesitancy, nocturnal urination, and urinary incontinence. MS: Denies joint pain or swelling.  Denies muscle weakness, cramps, atrophy.  Derm: Denies rash, itching, oral ulcerations, hives, unhealing ulcers.  Psych: Denies depression, anxiety, memory loss, suicidal ideation, hallucinations,  and confusion. Heme: Denies bruising, bleeding, and enlarged lymph nodes. Neuro:  Denies any headaches, dizziness, paresthesias. Endo:  Denies any problems with DM, thyroid, adrenal function.  Physical Exam: Vital signs in last 24 hours: Temp:  [96.4 F (35.8 C)-97.2 F (36.2 C)] 97.2 F (36.2 C) (06/15 0629) Pulse Rate:  [31-101] 71 (06/15 0629) Resp:  [9-17] 12 (06/14 2233) BP: (112-170)/(64-98) 143/83 (06/15 0629) SpO2:  [17 %-100 %] 100 % (06/15 0629) Weight:  [52.7 kg] 52.7 kg (06/14 2233) Last BM Date: 11/24/18  General:   Alert,  Well-developed,  well-nourished, pleasant and cooperative in NAD Head:  Normocephalic and atraumatic. Eyes:  Sclera clear, no icterus.  Mild pallor Ears:  Normal auditory acuity. Nose:  No deformity, discharge,  or lesions. Mouth:  No deformity or lesions.  Oropharynx pink & moist. Neck:  Supple; no masses or thyromegaly. Lungs:  Clear throughout to auscultation.   No wheezes, crackles, or rhonchi. No acute distress. Heart:  Regular rate and rhythm; no murmurs, clicks, rubs,  or gallops. Extremities:  Without clubbing or edema. Neurologic:  Alert and  oriented x4;  grossly normal neurologically. Skin:  Intact without significant lesions or rashes. Psych:  Alert and cooperative. Normal mood and affect. Abdomen:  Soft, nontender and nondistended. No masses, hepatosplenomegaly or hernias noted. Normal bowel sounds, without guarding, and without rebound.         Lab Results: Recent Labs    11/28/18 1545 11/29/18 0235 11/29/18 0913  WBC 10.0 10.0 7.8  HGB 12.5 10.9* 10.7*  HCT 36.9 32.3* 32.5*  PLT 352 310 275   BMET Recent Labs    11/28/18 1545 11/29/18 0235 11/29/18 0424  NA 138  --  141  K 2.6* 2.6* 2.8*  CL 91*  --  104  CO2 33*  --  26  GLUCOSE 110*  --  89  BUN 9  --  5*  CREATININE 1.31*  --  1.08*  CALCIUM 10.1  --  8.3*   LFT Recent Labs    11/29/18 0913  PROT 6.2*  ALBUMIN 3.2*  AST 161*  ALT 64*  ALKPHOS 98  BILITOT 1.3*  BILIDIR 0.3*  IBILI 1.0*   PT/INR Recent Labs    11/29/18 0235  LABPROT 13.7  INR 1.1    Studies/Results: Ct Abdomen Pelvis W Contrast  Result Date: 11/28/2018 CLINICAL DATA:  GI bleed. Weight loss abdominal pain. Black tarry stools for 1 week. Bright red blood per rectum 2 weeks before that. EXAM: CT ABDOMEN AND PELVIS WITH CONTRAST TECHNIQUE: Multidetector CT imaging of the abdomen and pelvis was performed using the standard protocol following bolus administration of intravenous contrast. CONTRAST:  52mL OMNIPAQUE IOHEXOL 300 MG/ML  SOLN  COMPARISON:  CT, 06/15/2014 FINDINGS: Lower chest: Lung bases essentially clear. Heart normal size. Pectus excavatum deformity. Hepatobiliary: 2, 4 mm low-density lesions in the right lobe consistent with cysts. Focal fat adjacent to the falciform ligament. No other liver abnormality. There is diffuse gallbladder wall thickening, maximum of 9 mm. No convincing gallstone. No bile duct dilation. Pancreas: Unremarkable. No pancreatic ductal dilatation or surrounding inflammatory changes. Spleen: Normal in size without focal abnormality. Adrenals/Urinary Tract: No adrenal masses. Kidneys normal in overall size, orientation and position. 2.6 cm midpole right renal cyst. Small low-density lesion in the anterior midpole the left kidney, also likely a cyst but not fully characterized. No other renal masses or lesions. No hydronephrosis. Ureters normal in course and in caliber. Bladder is unremarkable, but partly obscured by artifact from bilateral hip prostheses. Stomach/Bowel: Mild haziness along wall of the distal esophagus at the gastroesophageal junction. This is nonspecific. Stomach is unremarkable. Small bowel and colon are normal in caliber. No evidence of a mass. No wall thickening or inflammation. Normal appendix visualized. Vascular/Lymphatic: No significant vascular findings are present. No enlarged abdominal or pelvic lymph nodes. Reproductive: Uterus is heterogeneous. 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 Other: No abdominal wall hernia.  No ascites. Musculoskeletal: Status post L4-L5 fusion. Status post bilateral total hip arthroplasties. No fracture or acute finding. IMPRESSION: 1. Diffuse gallbladder wall thickening, maximum of 9 mm. The etiology of this is unclear. Recommend further assessment with limited right upper quadrant ultrasound. 2. Mild haziness along the wall of the lower esophagus at the gastroesophageal junction. This may reflect inflammation. Consider  follow-up upper endoscopy for further assessment. 3. No evidence of a bowel mass, obstruction or inflammation. No other findings to account for GI bleeding. Electronically Signed   By: Lajean Manes M.D.   On: 11/28/2018 19:26   Nm Hepato W/eject Fract  Result Date: 11/29/2018 CLINICAL DATA:  Loss of appetite.  Weight loss. EXAM: NUCLEAR MEDICINE HEPATOBILIARY IMAGING WITH GALLBLADDER EF TECHNIQUE: Sequential images of the abdomen were obtained out to 60 minutes following intravenous administration of radiopharmaceutical. After oral ingestion of Ensure, gallbladder ejection fraction was determined. At 60 min, normal ejection fraction is greater than 33%. RADIOPHARMACEUTICALS:  5.1 mCi Tc-31m  Choletec IV COMPARISON:  Ultrasound 11/29/2018. FINDINGS: Prompt uptake and biliary excretion of activity by the liver is seen. Gallbladder activity is visualized, consistent with patency of cystic duct. Biliary activity passes into small bowel, consistent with patent common bile duct. Calculated gallbladder ejection fraction is 0%. (Normal  gallbladder ejection fraction with Ensure is greater than 33%.) IMPRESSION: 1.  Abnormal gallbladder ejection fraction at 0%. 2. Liver, biliary system, gallbladder, and bowel visualize normally. Electronically Signed   By: Marcello Moores  Register   On: 11/29/2018 14:39   Dg Hips Bilat With Pelvis 3-4 Views  Result Date: 11/29/2018 CLINICAL DATA:  Right greater than left hip pain after a fall 4 days ago. EXAM: DG HIP (WITH OR WITHOUT PELVIS) 3-4V BILAT COMPARISON:  CT abdomen and pelvis 11/28/2018. Right hip radiographs 09/27/2015. 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 No acute fracture or dislocation is identified. There is no lucency about the femoral stems to suggest loosening or infection. A prominent subchondral cyst superiorly in the left acetabulum is unchanged. Excreted IV contrast is noted in the bladder, and postsurgical changes are noted in the lower lumbar  spine. 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 Electronically Signed   By: Logan Bores M.D.   On: 11/29/2018 09:49   US Abdomen Limited Ruq  Result Date: 11/29/2018 CLINICAL DATA:  Abnormal gallbladder on CT. EXAM: ULTRASOUND ABDOMEN LIMITED RIGHT UPPER QUADRANT COMPARISON:  CT 11/28/2018. FINDINGS: Gallbladder: Multiple tiny gallstones are noted. Gallbladder sludge noted. Gallbladder wall thickness is 14.1 mm suggesting cholecystitis. Negative Murphy sign. No pericholecystic fluid collection. Common bile duct: Diameter: 6.3 mm Liver: No focal lesion identified. Within normal limits in parenchymal echogenicity. Portal vein is patent on color Doppler imaging with normal direction of blood flow towards the liver. IMPRESSION: Multiple tiny gallstones noted. Gallbladder sludge noted. Gallbladder wall is thickened at 14.1 mm suggesting cholecystitis. No biliary distention. Electronically Signed   By: Marcello Moores  Register   On: 11/29/2018 06:02    Impression: Intermittent dark stools and rectal bleeding Significant family history of colon cancer in 3 first-degree relatives Normocytic anemia Mild haziness along the wall of distal esophagus or GE junction  Elevated liver enzymes, T bili/AST/ALT/ALP of 1.3/161/64/98 Multiple tiny gallstones, gallbladder sludge and thickened gallbladder wall at 14.1 mm suggestive of cholecystitis normal CBD of 6.3 mm HIDA showed ejection fraction of 0%  Hypokalemia   Plan: Proceed with diagnostic EGD and colonoscopy in a.m. The risks and the benefits of the procedure were discussed with the patient in details. She verbalized understanding and consents.  Recommend surgical evaluation, as patient has abnormal CAT scan, ultrasound and HIDA scan with abnormal LFTs suspicious for cholecystitis.   LOS: 0 days   [name], MD  11/29/2018, 2:43 PM  Pager [phone] If no answer or after 5 PM call [phone]

## 2018-11-29 NOTE — Consult Note (Signed)
Cardiology Consultation:   Patient ID: Brandi [name] MRN: [id number]; DOB: 11-27-1945  Admit date: 11/28/2018 Date of Consult: 11/29/2018  Primary Care Provider: Vincente Liberty, MD Primary Cardiologist: new patient/ Dr Meda Coffee   Patient Profile:   Brandi [name] is a 73 y.o. female who is being seen today for the evaluation of elevated troponin at the request of Ivor Costa, MD.  History of Present Illness:   Brandi [name] is a 73 y.o. female with medical history significant of hypertension, GERD, depression, anxiety, GI bleeding, gastric ulcer, insomnia, who presents with dark stool, and syncope. The patient states that she has been having intermittent dark tarry stool for more than 2 weeks.She had a small amount of bright red blood today. No active bleeding now.  She states that she has nausea and vomited twice earlier, none biliary nonbloody vomitus.  She complains of severe heart burning sensation in her chest, no diarrhea or abdominal pain.  Patient states that she has poor appetite and decreased oral intake, and lost 15 pounds recently. Patient denies shortness of breath, cough, fever or chills.   While in ED patient returned from her CT scan and appeared of syncopal episode. She was found by the technician to be "not responsive". No hypotension or signs of allergic reaction to contrast.  No signs of stroke like symptoms. Per ED notes there were no arrhythmias or pauses on telemetry.   The patient states that she has h/o gastric ulcer and FH of colon cancer. She has been experiencing episodes of melenas and blood in the stool associated with chest pain/burning for the last two years. She has lost significant amount of wait. Last EGD/colonoscopy done in [place]. She also admits to having exertional dyspnea, especially when walking stairs.   Past Medical History:  Diagnosis Date  . Anxiety    takes Valium daily as needed  . Chronic back pain   . Chronic back pain   . Complication of  anesthesia 1980s   "I stopped breathing on the table; I was gettin my wisdom teeth extracted"  . Constipation   . Depression    takes Lexapro daily  . Dysrhythmia    pt. reports that her heart skips sometimes   . GERD (gastroesophageal reflux disease)    takes Zantac daily  . History of blood transfusion    no abnormal reaction noted  . History of colon polyps    benign  . History of gastric ulcer   . History of GI bleed   . History of hiatal hernia   . History of stress test    done in [place]- done in  1992, told that it was normal.   . Hypertension   . Insomnia    takes Restoril and Trazodone nightly as needed  . Joint pain   . Joint swelling   . Nocturia   . Osteoarthritis   . Peripheral edema    occasionally but never been on fluid pill    Past Surgical History:  Procedure Laterality Date  . BACK SURGERY    . COLONOSCOPY    . ESOPHAGOGASTRODUODENOSCOPY N/A 06/16/2014   Procedure: ESOPHAGOGASTRODUODENOSCOPY (EGD);  Surgeon: Missy Sabins, MD;  Location: [facility] ENDOSCOPY;  Service: Endoscopy;  Laterality: N/A;  . JOINT REPLACEMENT    . LUMBAR FUSION    . SHOULDER SURGERY Left   . TOE SURGERY Right    "big toe was coming off"  . TOE SURGERY Left    "bone spur"  . TOTAL HIP  ARTHROPLASTY Left 02/26/2015   Procedure: TOTAL HIP ARTHROPLASTY ANTERIOR APPROACH;  Surgeon: Frederik Pear, MD;  Location: [facility];  Service: Orthopedics;  Laterality: Left;  . TOTAL HIP ARTHROPLASTY Right 03/21/2016   Procedure: TOTAL HIP ARTHROPLASTY ANTERIOR APPROACH;  Surgeon: Frederik Pear, MD;  Location: [facility];  Service: Orthopedics;  Laterality: Right;  . WISDOM TOOTH EXTRACTION  1980s     Inpatient Medications: Scheduled Meds: . atorvastatin  80 mg Oral q1800  . estrogen (conjugated)-medroxyprogesterone  1 tablet Oral QHS  . pantoprazole  40 mg Oral BID  . potassium chloride  40 mEq Oral Once  . sodium chloride flush  3 mL Intravenous Q12H   Continuous Infusions: . sodium chloride 100 mL/hr at  11/29/18 0728  . potassium chloride     PRN Meds: acetaminophen **OR** acetaminophen, atropine, diazepam, hydrALAZINE, morphine injection, nitroGLYCERIN  Allergies:    Allergies  Allergen Reactions  . Aspirin Other (See Comments)    Stomach ulcer, now inactive, OK with low dose ASA    Social History:   Social History   Socioeconomic History  . Marital status: Married    Spouse name: Not on file  . Number of children: Not on file  . Years of education: Not on file  . Highest education level: Not on file  Occupational History  . Occupation: retired    Fish farm manager: [place]  . Financial resource strain: Not on file  . Food insecurity    Worry: Not on file    Inability: Not on file  . Transportation needs    Medical: Not on file    Non-medical: Not on file  Tobacco Use  . Smoking status: Never Smoker  . Smokeless tobacco: Never Used  Substance and Sexual Activity  . Alcohol use: No  . Drug use: No  . Sexual activity: Yes    Birth control/protection: Post-menopausal  Lifestyle  . Physical activity    Days per week: Not on file    Minutes per session: Not on file  . Stress: Not on file  Relationships  . Social Herbalist on phone: Not on file    Gets together: Not on file    Attends religious service: Not on file    Active member of club or organization: Not on file    Attends meetings of clubs or organizations: Not on file    Relationship status: Not on file  . Intimate partner violence    Fear of current or ex partner: Not on file    Emotionally abused: Not on file    Physically abused: Not on file    Forced sexual activity: Not on file  Other Topics Concern  . Not on file  Social History Narrative   Retired from [employer] in [place], exercise - walking   retired Environmental manager, Musician, librarian    Family History:    Family History  Problem Relation Age of Onset  . Cancer Mother        colon cancer  . Cancer  Father        colon cancer  . Cancer Sister 55       died of colon cancer  . Heart disease Neg Hx      ROS:  Please see the history of present illness.  All other ROS reviewed and negative.     Physical Exam/Data:   Vitals:   11/29/18 3419 11/29/18 0334 11/29/18 0339 11/29/18 0629  BP:  127/77 112/77 117/77 (!) 143/83  Pulse: 75 79 80 71  Resp:      Temp:    (!) 97.2 F (36.2 C)  TempSrc:    Oral  SpO2: 100% 99% 100% 100%  Weight:      Height:        Intake/Output Summary (Last 24 hours) at 11/29/2018 0921 Last data filed at 11/29/2018 0630 Gross per 24 hour  Intake 900 ml  Output -  Net 900 ml   Last 3 Weights 11/28/2018 09/07/2018 07/09/2016  Weight (lbs) 116 lb 2.9 oz 130 lb 131 lb  Weight (kg) 52.7 kg 58.968 kg 59.421 kg     Body mass index is 19.94 kg/m.  General: underweight, in no acute distress HEENT: normal Lymph: no adenopathy Neck: no JVD Endocrine:  No thryomegaly Vascular: No carotid bruits; FA pulses 2+ bilaterally without bruits  Cardiac:  normal S1, S2; RRR; no murmur  Lungs:  clear to auscultation bilaterally, no wheezing, rhonchi or rales  Abd: soft, nontender, no hepatomegaly  Ext: no edema Musculoskeletal:  No deformities, BUE and BLE strength normal and equal Skin: warm and dry  Neuro:  CNs 2-12 intact, no focal abnormalities noted Psych:  Normal affect   EKG:  The EKG was personally reviewed and demonstrates:  SR, non-specific ST-T wave abnormalities Telemetry:  Telemetry was personally reviewed and demonstrates:  SR  Relevant CV Studies:  11/29/2018   1. The left ventricle has normal systolic function, with an ejection fraction of 60-65%. The cavity size was normal. Left ventricular diastolic function could not be evaluated.  2. The right ventricle has normal systolc function. The cavity was normal. There is no increase in right ventricular wall thickness.  3. Mild thickening of the mitral valve leaflet.  4. Aortic valve regurgitation  was not assessed by color flow Doppler.  5. Pulmonic valve regurgitation was not assessed by color flow Doppler.  Laboratory Data:  Chemistry Recent Labs  Lab 11/28/18 1545 11/29/18 0235 11/29/18 0424  NA 138  --  141  K 2.6* 2.6* 2.8*  CL 91*  --  104  CO2 33*  --  26  GLUCOSE 110*  --  89  BUN 9  --  5*  CREATININE 1.31*  --  1.08*  CALCIUM 10.1  --  8.3*  GFRNONAA 41*  --  52*  GFRAA 47*  --  60*  ANIONGAP 14  --  11    Recent Labs  Lab 11/28/18 1545  PROT 8.1  ALBUMIN 4.3  AST 30  ALT 16  ALKPHOS 55  BILITOT 1.4*   Hematology Recent Labs  Lab 11/28/18 1545 11/29/18 0235  WBC 10.0 10.0  RBC 4.02 3.57*  HGB 12.5 10.9*  HCT 36.9 32.3*  MCV 91.8 90.5  MCH 31.1 30.5  MCHC 33.9 33.7  RDW 14.8 14.6  PLT 352 310   Cardiac Enzymes Recent Labs  Lab 11/29/18 0235  TROPONINI 0.10*   No results for input(s): TROPIPOC in the last 168 hours.  BNPNo results for input(s): BNP, PROBNP in the last 168 hours.  DDimer No results for input(s): DDIMER in the last 168 hours.  Radiology/Studies:  Ct Abdomen Pelvis W Contrast  Result Date: 11/28/2018 CLINICAL DATA:  GI bleed. Weight loss abdominal pain. Black tarry stools for 1 week. Bright red blood per rectum 2 weeks before that. IMPRESSION: 1. Diffuse gallbladder wall thickening, maximum of 9 mm. The etiology of this is unclear. Recommend further assessment with limited right upper  quadrant ultrasound. 2. Mild haziness along the wall of the lower esophagus at the gastroesophageal junction. This may reflect inflammation. Consider follow-up upper endoscopy for further assessment.   US Abdomen Limited Ruq  Result Date: 11/29/2018 CLINICAL DATA:  Abnormal gallbladder on CT. EXAM: ULTRASOUND ABDOMEN LIMITED RIGHT UPPER QUADRANT COMPARISON:  CT 11/28/2018. FINDINGS: Gallbladder: Multiple tiny gallstones are noted. Gallbladder sludge noted. Gallbladder wall thickness is 14.1 mm suggesting cholecystitis. Negative Murphy sign. No  pericholecystic fluid collection. Common bile duct: Diameter: 6.3 mm Liver: No focal lesion identified. Within normal limits in parenchymal echogenicity. Portal vein is patent on color Doppler imaging with normal direction of blood flow towards the liver. IMPRESSION: Multiple tiny gallstones noted. Gallbladder sludge noted. Gallbladder wall is thickened at 14.1 mm suggesting cholecystitis. No biliary distention.   Assessment and Plan:   1. Syncope 1. Unexplained, reports of skipped beats, none on telemetry now, also in the settings of severe bradycardia and prolonged QT/QTc 2. Continue aggressive K replacement  3. I will order daily ECGs to minitor Qt/QTc  2. Prolonged QT/QTc in the settings of ausea/vomiting and severe hypokalemia, now improving 583/669 ms-> 519/621 ms -> 476/538 ms 1. We will follow  3. GI bleed, h/o melenas, associated with chest burning 1. HIDAA scan negative, surgical consult excluded acute cholecystitis 2. Awaiting EGD, colonoscopy tomorrow  4. Elevated troponin -> most probably demand ischemia, now downtrending 1. In the settings of acute anemia, GIB, nausea/vomiting 2. Echocardiogram showed normal LVEF and no regional wall motion abnormalities. I would proceed with EGD, colonoscopy and obtain coronary CTA once GI workup is completed, no ASA or statins given GIB and elevated LFTs.  We will follow.  For questions or updates, please contact [facility] Please consult [email or web address] for contact info under   Signed, Ena Dawley, MD  11/29/2018 9:21 AM

## 2018-11-29 NOTE — Progress Notes (Addendum)
  Echocardiogram 2D Echocardiogram has been performed.  Technically difficult study due to small rib spacing. 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   Fay Bagg L Androw 11/29/2018, 4:00 PM

## 2018-11-29 NOTE — Progress Notes (Signed)
Pt Trop 0.10, K 2.6.  Pt c/o CP 7/10 treated with 3 SL NTG, O2 @ 4L per [place] applied.  No acute EKG changes noted. [facility] paged via [facility].  Awaiting further orders.

## 2018-11-29 NOTE — H&P (View-Only) (Signed)
[facility] Consult  Referring Provider: [facility] Primary Care Physician:  Vincente Liberty, MD Primary Gastroenterologist: Dr.Schooler  Reason for Consultation:  Dark stools, blood in stool, abnormal LFTs  HPI: Brandi [name] is a 73 y.o. female was admitted yesterday complains of intermittent dark stools for the past 2 weeks.  This was associated with epigastric and lower sternal discomfort and severe heartburn.  Patient has been taking Zantac(despite being aware of FDA recall) for acid reflux with minimal relief of symptoms.  She has had 19 pounds of unintentional weight loss in the last 2 weeks.  In the last 3 months she has also noted intermittent episodes of bright red blood per rectum which has been small in amount and worse with constipation.  Patient is always had irregular bowel movements, complains of constipation and having bowel movement likely 2-3 times a week.  Her father died from colon cancer at 20 her mother had colon cancer after 87 and a sister had colon cancer at 17.  Her last colonoscopy was in [place] in 2015, last EGD was in 2016 which was normal except evidence of hiatal hernia. Patient denies difficulty swallowing or pain on swallowing. In the ER after getting a CAT scan she had a documented episode of syncope, possible contrast allergy related reaction.  Patient states she last traveled to [place] in February and denies recent fever, chills or rigors. She is not on any antiplatelets and denies use of NSAIDs.  Past Medical History:  Diagnosis Date  . Anxiety    takes Valium daily as needed  . Chronic back pain   . Chronic back pain   . Complication of anesthesia 1980s   "I stopped breathing on the table; I was gettin my wisdom teeth extracted"  . Constipation   . Depression    takes Lexapro daily  . Dysrhythmia    pt. reports that her heart skips sometimes   . GERD (gastroesophageal reflux disease)    takes Zantac daily  .  History of blood transfusion    no abnormal reaction noted  . History of colon polyps    benign  . History of gastric ulcer   . History of GI bleed   . History of hiatal hernia   . History of stress test    done in [place]- done in  1992, told that it was normal.   . Hypertension   . Insomnia    takes Restoril and Trazodone nightly as needed  . Joint pain   . Joint swelling   . Nocturia   . Osteoarthritis   . Peripheral edema    occasionally but never been on fluid pill    Past Surgical History:  Procedure Laterality Date  . BACK SURGERY    . COLONOSCOPY    . ESOPHAGOGASTRODUODENOSCOPY N/A 06/16/2014   Procedure: ESOPHAGOGASTRODUODENOSCOPY (EGD);  Surgeon: Missy Sabins, MD;  Location[facility] ENDOSCOPY;  Service: Endoscopy;  Laterality: N/A;  . JOINT REPLACEMENT    . LUMBAR FUSION    . SHOULDER SURGERY Left   . TOE SURGERY Right    "big toe was coming off"  . TOE SURGERY Left    "bone spur"  . TOTAL HIP ARTHROPLASTY Left 02/26/2015   Procedure: TOTAL HIP ARTHROPLASTY ANTERIOR APPROACH;  Surgeon: Frederik Pear, MD;  Location: [facility];  Service: Orthopedics;  Laterality: Left;  . TOTAL HIP ARTHROPLASTY Right 03/21/2016   Procedure: TOTAL HIP ARTHROPLASTY ANTERIOR APPROACH;  Surgeon: Frederik Pear, MD;  Location: [facility]  OR;  Service: Orthopedics;  Laterality: Right;  . WISDOM TOOTH EXTRACTION  1980s    Prior to Admission medications   Medication Sig Start Date End Date Taking? Authorizing Provider  diazepam (VALIUM) 10 MG tablet TAKE 1 TABLET BY MOUTH EVERY 12 HOURS AS NEEDED FOR ANXIETY Patient taking differently: Take 10 mg by mouth every 12 (twelve) hours as needed for anxiety. TAKE 1 TABLET BY MOUTH EVERY 12 HOURS AS NEEDED FOR ANXIETY 05/19/16  Yes Roma Schanz R, DO  diclofenac sodium (VOLTAREN) 1 % GEL APPLY 2 G TOPICALLY 4 (FOUR) TIMES DAILY. Patient taking differently: Apply 2 g topically as needed (pain).  02/05/15  Yes Roma Schanz R, DO  estrogen,  conjugated,-medroxyprogesterone (PREMPRO) 0.3-1.5 MG tablet Take 1 tablet by mouth daily. Patient taking differently: Take 1 tablet by mouth at bedtime.  09/05/15  Yes Roma Schanz R, DO  temazepam (RESTORIL) 15 MG capsule 1-2 po qhs prn Patient taking differently: Take 15 mg by mouth at bedtime as needed for sleep. 1-2 po qhs prn 06/13/16  Yes Lowne Lyndal Pulley R, DO  traZODone (DESYREL) 50 MG tablet Take 1 tablet (50 mg total) by mouth at bedtime. 06/13/16  Yes Ann Held, DO  aspirin EC 325 MG tablet Take 1 tablet (325 mg total) by mouth 2 (two) times daily. Patient not taking: Reported on 11/28/2018 03/21/16   Leighton Parody, PA-C  benzonatate (TESSALON PERLES) 100 MG capsule Take 1-2 capsules (100-200 mg total) by mouth 3 (three) times daily as needed for cough. Patient not taking: Reported on 11/28/2018 07/09/16   Smiley Houseman, MD  cetirizine (ZYRTEC ALLERGY) 10 MG tablet Take 1 tablet (10 mg total) by mouth daily. Patient not taking: Reported on 11/28/2018 09/07/18   Blanchie Dessert, MD  cyclobenzaprine (FLEXERIL) 5 MG tablet Take 1 tablet (5 mg total) by mouth 3 (three) times daily. Patient not taking: Reported on 11/28/2018 04/18/16   Carollee Herter, Alferd Apa, DO  doxycycline (VIBRA-TABS) 100 MG tablet Take 1 tablet (100 mg total) by mouth 2 (two) times daily. Patient not taking: Reported on 11/28/2018 06/13/16   Carollee Herter, Alferd Apa, DO  fluticasone [facility]) 50 MCG/ACT nasal spray Place 2 sprays into both nostrils daily. Patient not taking: Reported on 11/28/2018 06/03/16   Saguier, Percell Miller, PA-C  hydrochlorothiazide (HYDRODIURIL) 25 MG tablet Take 1 tablet (25 mg total) by mouth daily. Patient not taking: Reported on 11/28/2018 11/23/15   Carollee Herter, Alferd Apa, DO  hydrocortisone cream 1 % Apply to affected area 2 times daily Patient not taking: Reported on 11/28/2018 10/11/15   Hedges, Dellis Filbert, PA-C  mometasone (NASONEX) 50 MCG/ACT nasal spray Place 2 sprays into the  nose daily. Patient not taking: Reported on 11/28/2018 06/13/16   Carollee Herter, Kendrick Fries R, DO  ondansetron (ZOFRAN) 4 MG tablet Take 1 tablet (4 mg total) by mouth every 8 (eight) hours as needed for nausea or vomiting. Patient not taking: Reported on 11/28/2018 10/07/16   Wallene Huh, DPM  ondansetron (ZOFRAN) 4 MG tablet Take 1 tablet (4 mg total) by mouth every 8 (eight) hours as needed for nausea or vomiting. Patient not taking: Reported on 11/28/2018 10/23/16   Wallene Huh, DPM  oseltamivir (TAMIFLU) 75 MG capsule Take 1 capsule (75 mg total) by mouth 2 (two) times daily. Take for 5 days Patient not taking: Reported on 11/28/2018 07/07/16   Carollee Herter, Alferd Apa, DO  polyethylene glycol [facility]) packet Take 17 g by mouth  daily. For constipation Patient not taking: Reported on 11/28/2018 07/09/16   Smiley Houseman, MD  ranitidine (ZANTAC) 150 MG tablet TAKE 1 TABLET BY MOUTH 2 (TWO) TIMES DAILY. Patient not taking: TAKE 1 TABLET BY MOUTH 2 (TWO) TIMES DAILY. 09/01/16   Mosie Lukes, MD  spironolactone (ALDACTONE) 25 MG tablet Take 1 tablet (25 mg total) by mouth daily. Patient not taking: Reported on 11/28/2018 07/07/16   Ann Held, DO    Current Facility-Administered Medications  Medication Dose Route Frequency Provider Last Rate Last Dose  . 0.9 %  sodium chloride infusion   Intravenous Continuous Ivor Costa, MD 100 mL/hr at 11/29/18 0728    . acetaminophen (TYLENOL) tablet 650 mg  650 mg Oral Q6H PRN Ivor Costa, MD   650 mg at 11/29/18 3151   Or  . acetaminophen (TYLENOL) suppository 650 mg  650 mg Rectal Q6H PRN Ivor Costa, MD      . atropine 1 MG/10ML injection 0.5 mg  0.5 mg Intravenous PRN Ivor Costa, MD      . diazepam (VALIUM) tablet 10 mg  10 mg Oral Q12H PRN Ivor Costa, MD   10 mg at 11/28/18 2346  . estrogen (conjugated)-medroxyprogesterone (PREMPRO) 0.3-1.5 MG per tablet 1 tablet  1 tablet Oral QHS Ivor Costa, MD      . hydrALAZINE (APRESOLINE) injection 5 mg   5 mg Intravenous Q2H PRN Ivor Costa, MD      . morphine 2 MG/ML injection 1 mg  1 mg Intravenous Q4H PRN Ivor Costa, MD   1 mg at 11/29/18 0447  . nitroGLYCERIN (NITROSTAT) SL tablet 0.4 mg  0.4 mg Sublingual Q5 min PRN Ivor Costa, MD   0.4 mg at 11/29/18 0333  . pantoprazole (PROTONIX) EC tablet 40 mg  40 mg Oral BID Ivor Costa, MD   40 mg at 11/29/18 1004  . polyethylene glycol-electrolytes (NuLYTELY/GoLYTELY) solution 4,000 mL  4,000 mL Oral Once [name], MD      . potassium chloride 10 mEq in 100 mL IVPB  10 mEq Intravenous Q1 Hr x 2 Kc, Ramesh, MD 100 mL/hr at 11/29/18 1415 10 mEq at 11/29/18 1415  . sodium chloride flush (NS) 0.9 % injection 3 mL  3 mL Intravenous Q12H Ivor Costa, MD        Allergies as of 11/28/2018 - Review Complete 11/28/2018  Allergen Reaction Noted  . Aspirin Other (See Comments) 07/02/2012    Family History  Problem Relation Age of Onset  . Cancer Mother        colon cancer  . Cancer Father        colon cancer  . Cancer Sister 51       died of colon cancer  . Heart disease Neg Hx     Social History   Socioeconomic History  . Marital status: Married    Spouse name: Not on file  . Number of children: Not on file  . Years of education: Not on file  . Highest education level: Not on file  Occupational History  . Occupation: retired    Fish farm manager: [place]  . Financial resource strain: Not on file  . Food insecurity    Worry: Not on file    Inability: Not on file  . Transportation needs    Medical: Not on file    Non-medical: Not on file  Tobacco Use  . Smoking status: Never Smoker  . Smokeless tobacco: Never Used  Substance and Sexual Activity  . Alcohol use: No  . Drug use: No  . Sexual activity: Yes    Birth control/protection: Post-menopausal  Lifestyle  . Physical activity    Days per week: Not on file    Minutes per session: Not on file  . Stress: Not on file  Relationships  . Social Herbalist on  phone: Not on file    Gets together: Not on file    Attends religious service: Not on file    Active member of club or organization: Not on file    Attends meetings of clubs or organizations: Not on file    Relationship status: Not on file  . Intimate partner violence    Fear of current or ex partner: Not on file    Emotionally abused: Not on file    Physically abused: Not on file    Forced sexual activity: Not on file  Other Topics Concern  . Not on file  Social History Narrative   Retired from [employer] in [place], exercise - walking   retired Environmental manager, Musician, librarian    Review of Systems: Positive for: GI: Described in detail in HPI.    Gen: anorexia, fatigue, weakness, malaise, involuntary weight loss, Denies any fever, chills, rigors, night sweats, and sleep disorder CV: Denies chest pain, angina, palpitations, syncope, orthopnea, PND, peripheral edema, and claudication. Resp: Denies dyspnea, cough, sputum, wheezing, coughing up blood. GU : Denies urinary burning, blood in urine, urinary frequency, urinary hesitancy, nocturnal urination, and urinary incontinence. MS: Denies joint pain or swelling.  Denies muscle weakness, cramps, atrophy.  Derm: Denies rash, itching, oral ulcerations, hives, unhealing ulcers.  Psych: Denies depression, anxiety, memory loss, suicidal ideation, hallucinations,  and confusion. Heme: Denies bruising, bleeding, and enlarged lymph nodes. Neuro:  Denies any headaches, dizziness, paresthesias. Endo:  Denies any problems with DM, thyroid, adrenal function.  Physical Exam: Vital signs in last 24 hours: Temp:  [96.4 F (35.8 C)-97.2 F (36.2 C)] 97.2 F (36.2 C) (06/15 0629) Pulse Rate:  [31-101] 71 (06/15 0629) Resp:  [9-17] 12 (06/14 2233) BP: (112-170)/(64-98) 143/83 (06/15 0629) SpO2:  [17 %-100 %] 100 % (06/15 0629) Weight:  [52.7 kg] 52.7 kg (06/14 2233) Last BM Date: 11/24/18  General:   Alert,  Well-developed,  well-nourished, pleasant and cooperative in NAD Head:  Normocephalic and atraumatic. Eyes:  Sclera clear, no icterus.  Mild pallor Ears:  Normal auditory acuity. Nose:  No deformity, discharge,  or lesions. Mouth:  No deformity or lesions.  Oropharynx pink & moist. Neck:  Supple; no masses or thyromegaly. Lungs:  Clear throughout to auscultation.   No wheezes, crackles, or rhonchi. No acute distress. Heart:  Regular rate and rhythm; no murmurs, clicks, rubs,  or gallops. Extremities:  Without clubbing or edema. Neurologic:  Alert and  oriented x4;  grossly normal neurologically. Skin:  Intact without significant lesions or rashes. Psych:  Alert and cooperative. Normal mood and affect. Abdomen:  Soft, nontender and nondistended. No masses, hepatosplenomegaly or hernias noted. Normal bowel sounds, without guarding, and without rebound.         Lab Results: Recent Labs    11/28/18 1545 11/29/18 0235 11/29/18 0913  WBC 10.0 10.0 7.8  HGB 12.5 10.9* 10.7*  HCT 36.9 32.3* 32.5*  PLT 352 310 275   BMET Recent Labs    11/28/18 1545 11/29/18 0235 11/29/18 0424  NA 138  --  141  K 2.6* 2.6* 2.8*  CL 91*  --  104  CO2 33*  --  26  GLUCOSE 110*  --  89  BUN 9  --  5*  CREATININE 1.31*  --  1.08*  CALCIUM 10.1  --  8.3*   LFT Recent Labs    11/29/18 0913  PROT 6.2*  ALBUMIN 3.2*  AST 161*  ALT 64*  ALKPHOS 98  BILITOT 1.3*  BILIDIR 0.3*  IBILI 1.0*   PT/INR Recent Labs    11/29/18 0235  LABPROT 13.7  INR 1.1    Studies/Results: Ct Abdomen Pelvis W Contrast  Result Date: 11/28/2018 CLINICAL DATA:  GI bleed. Weight loss abdominal pain. Black tarry stools for 1 week. Bright red blood per rectum 2 weeks before that. EXAM: CT ABDOMEN AND PELVIS WITH CONTRAST TECHNIQUE: Multidetector CT imaging of the abdomen and pelvis was performed using the standard protocol following bolus administration of intravenous contrast. CONTRAST:  73mL OMNIPAQUE IOHEXOL 300 MG/ML  SOLN  COMPARISON:  CT, 06/15/2014 FINDINGS: Lower chest: Lung bases essentially clear. Heart normal size. Pectus excavatum deformity. Hepatobiliary: 2, 4 mm low-density lesions in the right lobe consistent with cysts. Focal fat adjacent to the falciform ligament. No other liver abnormality. There is diffuse gallbladder wall thickening, maximum of 9 mm. No convincing gallstone. No bile duct dilation. Pancreas: Unremarkable. No pancreatic ductal dilatation or surrounding inflammatory changes. Spleen: Normal in size without focal abnormality. Adrenals/Urinary Tract: No adrenal masses. Kidneys normal in overall size, orientation and position. 2.6 cm midpole right renal cyst. Small low-density lesion in the anterior midpole the left kidney, also likely a cyst but not fully characterized. No other renal masses or lesions. No hydronephrosis. Ureters normal in course and in caliber. Bladder is unremarkable, but partly obscured by artifact from bilateral hip prostheses. Stomach/Bowel: Mild haziness along wall of the distal esophagus at the gastroesophageal junction. This is nonspecific. Stomach is unremarkable. Small bowel and colon are normal in caliber. No evidence of a mass. No wall thickening or inflammation. Normal appendix visualized. Vascular/Lymphatic: No significant vascular findings are present. No enlarged abdominal or pelvic lymph nodes. Reproductive: Uterus is heterogeneous. 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 Other: No abdominal wall hernia.  No ascites. Musculoskeletal: Status post L4-L5 fusion. Status post bilateral total hip arthroplasties. No fracture or acute finding. IMPRESSION: 1. Diffuse gallbladder wall thickening, maximum of 9 mm. The etiology of this is unclear. Recommend further assessment with limited right upper quadrant ultrasound. 2. Mild haziness along the wall of the lower esophagus at the gastroesophageal junction. This may reflect inflammation. Consider  follow-up upper endoscopy for further assessment. 3. No evidence of a bowel mass, obstruction or inflammation. No other findings to account for GI bleeding. Electronically Signed   By: Lajean Manes M.D.   On: 11/28/2018 19:26   Nm Hepato W/eject Fract  Result Date: 11/29/2018 CLINICAL DATA:  Loss of appetite.  Weight loss. EXAM: NUCLEAR MEDICINE HEPATOBILIARY IMAGING WITH GALLBLADDER EF TECHNIQUE: Sequential images of the abdomen were obtained out to 60 minutes following intravenous administration of radiopharmaceutical. After oral ingestion of Ensure, gallbladder ejection fraction was determined. At 60 min, normal ejection fraction is greater than 33%. RADIOPHARMACEUTICALS:  5.1 mCi Tc-46m  Choletec IV COMPARISON:  Ultrasound 11/29/2018. FINDINGS: Prompt uptake and biliary excretion of activity by the liver is seen. Gallbladder activity is visualized, consistent with patency of cystic duct. Biliary activity passes into small bowel, consistent with patent common bile duct. Calculated gallbladder ejection fraction is 0%. (Normal  gallbladder ejection fraction with Ensure is greater than 33%.) IMPRESSION: 1.  Abnormal gallbladder ejection fraction at 0%. 2. Liver, biliary system, gallbladder, and bowel visualize normally. Electronically Signed   By: Marcello Moores  Register   On: 11/29/2018 14:39   Dg Hips Bilat With Pelvis 3-4 Views  Result Date: 11/29/2018 CLINICAL DATA:  Right greater than left hip pain after a fall 4 days ago. EXAM: DG HIP (WITH OR WITHOUT PELVIS) 3-4V BILAT COMPARISON:  CT abdomen and pelvis 11/28/2018. Right hip radiographs 09/27/2015. 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 No acute fracture or dislocation is identified. There is no lucency about the femoral stems to suggest loosening or infection. A prominent subchondral cyst superiorly in the left acetabulum is unchanged. Excreted IV contrast is noted in the bladder, and postsurgical changes are noted in the lower lumbar  spine. 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 Electronically Signed   By: Logan Bores M.D.   On: 11/29/2018 09:49   US Abdomen Limited Ruq  Result Date: 11/29/2018 CLINICAL DATA:  Abnormal gallbladder on CT. EXAM: ULTRASOUND ABDOMEN LIMITED RIGHT UPPER QUADRANT COMPARISON:  CT 11/28/2018. FINDINGS: Gallbladder: Multiple tiny gallstones are noted. Gallbladder sludge noted. Gallbladder wall thickness is 14.1 mm suggesting cholecystitis. Negative Murphy sign. No pericholecystic fluid collection. Common bile duct: Diameter: 6.3 mm Liver: No focal lesion identified. Within normal limits in parenchymal echogenicity. Portal vein is patent on color Doppler imaging with normal direction of blood flow towards the liver. IMPRESSION: Multiple tiny gallstones noted. Gallbladder sludge noted. Gallbladder wall is thickened at 14.1 mm suggesting cholecystitis. No biliary distention. Electronically Signed   By: Marcello Moores  Register   On: 11/29/2018 06:02    Impression: Intermittent dark stools and rectal bleeding Significant family history of colon cancer in 3 first-degree relatives Normocytic anemia Mild haziness along the wall of distal esophagus or GE junction  Elevated liver enzymes, T bili/AST/ALT/ALP of 1.3/161/64/98 Multiple tiny gallstones, gallbladder sludge and thickened gallbladder wall at 14.1 mm suggestive of cholecystitis normal CBD of 6.3 mm HIDA showed ejection fraction of 0%  Hypokalemia   Plan: Proceed with diagnostic EGD and colonoscopy in a.m. The risks and the benefits of the procedure were discussed with the patient in details. She verbalized understanding and consents.  Recommend surgical evaluation, as patient has abnormal CAT scan, ultrasound and HIDA scan with abnormal LFTs suspicious for cholecystitis.   LOS: 0 days   [name], MD  11/29/2018, 2:43 PM  Pager [phone] If no answer or after 5 PM call [phone]

## 2018-11-29 NOTE — Progress Notes (Signed)
PROGRESS NOTE    Brandi [name]  PRF:[id number] DOB: 1945-09-28 DOA: 11/28/2018 PCP: Vincente Liberty, MD   Brief Narrative:  73 y.o. female with medical history significant of hypertension, GERD, depression, anxiety, GI bleeding, gastric ulcer, insomnia, who presents with dark stool, and syncope. Patient having intermittent dark tarry stool for more than 2 weeks small amount of bright red blood on 6/14.  Has lost about 10 pounds recently has had poor appetite decreased intake.  In the ER she had work-up along with CT scan and on return from CT scan appeared to have "syncopal episode" was found to be "not responsive" by technician.  in ER, significantly hypokalemic, hemoglobin 12.5 (10.7 on 04/18/2016), negative FOBT,sent COVID-19 test, heart rate 43- 101, oxygen saturation 100% on room air, blood pressure 163/70  Subjective: Seen this morning, complains of mild chest discomfort.  Denies any abdominal pain.  Complains of pain on the right hip, reports having fall about 2 months back and also 2 weeks back with prior history of hip surgeries.  Assessment & Plan:   Syncope after returning from CT scan in the ER: Unclear etiology could be from electrolyte imbalance/?  Arrhythmia.  EKG has prolonged QTC.  Follow-up 2D echocardiogram.  Check orthostatic vital signs, continue IV fluids. Patient has pacer pads in place.  Cardiology consulted  Dark stools more than 2 weeks at home, suspect GI bleeding. But FOBT negative in ER. Currently no bowel movement.hb  On admission 12.5 (10.7 on 04/18/2016), and this am at  10.9-->10.7 w/o over sign of gi bleed. Cont ppi. C/s GI. Recent Labs  Lab 11/28/18 1545 11/29/18 0235 11/29/18 0913  HGB 12.5 10.9* 10.7*  HCT 36.9 32.3* 32.5*   Hypokalemia, unknown etiology, is being repleted aggressively.  Reports she has had similar hypokalemia in the past and was taking supplementation but has not needed recently.  Repeat this afternoon.  Acute kidney injury with  creatinine 1.3,improved to 1.0 with hydration.  QT prolongation:Monitor electrolytes, replete potassium aggressively and repeat EKG.  Mag is stable.  Cholelithiasis with thickened gallbladder wall ultrasound reported as acute cholecystitis.  Unclear etiology. Interestingly patient does not complain of significant abdominal pain.  Ordered LFTs and HIDA scan this am and her liver function tests are uptrending AST 161 ALT 64 total bili 1.3 from 1.4. Hold Lipitor. Addendum: HIDA scan came back with abnormal ejection fraction of 0% otherwise able to visualize liver, mediastinum, gallbladder and bowel. Discussed w GI and consulted Gen surgery for recommendations.  Elevated troponin 0.1-->0.05, also with chest pain. Likely demand mismatch. Cardiology has been consulted for further recommendation.  Aspirin has not been started due to dark stool/GI bleed concern. Check serial troponins.  Recent fall and pain in the right hip, obtaining hip x-ray.  She reports she has had hip surgeries and is followed by her orthopedic surgery.  GERD: PPI  Essential hypertension, benign: Blood pressure is controlled.  Insomnia/Anxiety: On as needed Valium at home.  Hold hme Restoril due to prolonged QTC.  Suppressed TSH at 0.28 , will check free T4 T3  DVT prophylaxis: SCD Code Status: full code Family Communication: plan of care discussed with the patient, verbalized understanding.  Available to update family if requested.  Disposition Plan: Anticipate her length of stay at least 2 midnights, patient is at risk for decompensation, needs close monitoring of electrolytes with aggressive replacement and also needs close monitoring of her abnormal EKGs/prolonged qtc , and monitor for arrhythmia.  Consultants:  Cardiology  Procedures:  CT  abdomen/pelvis: 1.Diffuse gallbladder wall thickening, maximum of 9 mm. The etiology of this is unclear. Recommend further assessment with limited right upper quadrant ultrasound.  2. Mild haziness along the wall of the lower esophagus at the gastroesophageal junction. This may reflect inflammation. Consider follow-up upper endoscopy for further assessment. 3. No evidence of a bowel mass, obstruction or inflammation. No other findings to account for GI bleeding.  RUQ [place] Multiple tiny gallstones noted. Gallbladder sludge noted. Gallbladder wall is thickened at 14.1 mm suggesting cholecystitis. No biliary distention  PAST 2016 ENDOSCOPIC IMPRESSION:  small hiatal hernia with no source of upper GI tract bleeding noted. RECOMMENDATIONS: monitor stools and hemoglobin and consider capsule endoscopy if continues to have melenic stools associated with drop in hemoglobin. REPEAT EXAM: eSigned: Teena Irani, MD 06/16/2014 11:39 AM  Antimicrobials: Anti-infectives (From admission, onward)   None       Objective: Vitals:   11/29/18 0326 11/29/18 0334 11/29/18 0339 11/29/18 0629  BP: 127/77 112/77 117/77 (!) 143/83  Pulse: 75 79 80 71  Resp:      Temp:    (!) 97.2 F (36.2 C)  TempSrc:    Oral  SpO2: 100% 99% 100% 100%  Weight:      Height:        Intake/Output Summary (Last 24 hours) at 11/29/2018 1139 Last data filed at 11/29/2018 0630 Gross per 24 hour  Intake 900 ml  Output --  Net 900 ml   Filed Weights   11/28/18 2233  Weight: 52.7 kg   Weight change:   Body mass index is 19.94 kg/m.  Intake/Output from previous day: 06/14 0701 - 06/15 0700 In: 900 [I.V.:800; IV Piggyback:100] Out: -  Intake/Output this shift: No intake/output data recorded.  Examination:  General exam: Appears calm and comfortable,Not in distress, older fore the age HEENT:PERRL,Oral mucosa moist, Ear/Nose normal on gross exam Respiratory system: Bilateral equal air entry, normal vesicular breath sounds, no wheezes or crackles  Cardiovascular system: S1 & S2 heard,No JVD, murmurs. Gastrointestinal system: Abdomen is  soft, mild tenderness epigastrium right abdomen on deep  palpation no Murphy sign, non distended, BS +  Nervous System:Alert and oriented. No focal neurological deficits/moving extremities, sensation intact. Extremities: No edema, no clubbing, distal peripheral pulses palpable. Skin: No rashes, lesions, no icterus MSK: Normal muscle bulk,tone ,power  Medications:  Scheduled Meds:  atorvastatin  80 mg Oral q1800   estrogen (conjugated)-medroxyprogesterone  1 tablet Oral QHS   pantoprazole  40 mg Oral BID   sodium chloride flush  3 mL Intravenous Q12H   Continuous Infusions:  sodium chloride 100 mL/hr at 11/29/18 3532    Data Reviewed: I have personally reviewed following labs and imaging studies  CBC: Recent Labs  Lab 11/28/18 1545 11/29/18 0235 11/29/18 0913  WBC 10.0 10.0 7.8  HGB 12.5 10.9* 10.7*  HCT 36.9 32.3* 32.5*  MCV 91.8 90.5 92.6  PLT 352 310 992   Basic Metabolic Panel: Recent Labs  Lab 11/28/18 1545 11/29/18 0235 11/29/18 0424 11/29/18 0913  NA 138  --  141  --   K 2.6* 2.6* 2.8*  --   CL 91*  --  104  --   CO2 33*  --  26  --   GLUCOSE 110*  --  89  --   BUN 9  --  5*  --   CREATININE 1.31*  --  1.08*  --   CALCIUM 10.1  --  8.3*  --   MG 2.0  --   --  2.2   GFR: Estimated Creatinine Clearance: 39.7 mL/min (A) (by C-G formula based on SCr of 1.08 mg/dL (H)). Liver Function Tests: Recent Labs  Lab 11/28/18 1545 11/29/18 0913  AST 30 161*  ALT 16 64*  ALKPHOS 55 98  BILITOT 1.4* 1.3*  PROT 8.1 6.2*  ALBUMIN 4.3 3.2*   No results for input(s): LIPASE, AMYLASE in the last 168 hours. No results for input(s): AMMONIA in the last 168 hours. Coagulation Profile: Recent Labs  Lab 11/29/18 0235  INR 1.1   Cardiac Enzymes: Recent Labs  Lab 11/29/18 0235 11/29/18 0913  TROPONINI 0.10* 0.05*   BNP (last 3 results) No results for input(s): PROBNP in the last 8760 hours. HbA1C: Recent Labs    11/29/18 0235  HGBA1C 5.2   CBG: Recent Labs  Lab 11/28/18 1912  GLUCAP 125*   Lipid  Profile: Recent Labs    11/29/18 0235  CHOL 207*  HDL 80  LDLCALC 113*  TRIG 71  CHOLHDL 2.6   Thyroid Function Tests: Recent Labs    11/29/18 0235  TSH 0.285*   Anemia Panel: No results for input(s): VITAMINB12, FOLATE, FERRITIN, TIBC, IRON, RETICCTPCT in the last 72 hours. Sepsis Labs: No results for input(s): PROCALCITON, LATICACIDVEN in the last 168 hours.  No results found for this or any previous visit (from the past 240 hour(s)).    Radiology Studies: Ct Abdomen Pelvis W Contrast  Result Date: 11/28/2018 CLINICAL DATA:  GI bleed. Weight loss abdominal pain. Black tarry stools for 1 week. Bright red blood per rectum 2 weeks before that. EXAM: CT ABDOMEN AND PELVIS WITH CONTRAST TECHNIQUE: Multidetector CT imaging of the abdomen and pelvis was performed using the standard protocol following bolus administration of intravenous contrast. CONTRAST:  64mL OMNIPAQUE IOHEXOL 300 MG/ML  SOLN COMPARISON:  CT, 06/15/2014 FINDINGS: Lower chest: Lung bases essentially clear. Heart normal size. Pectus excavatum deformity. Hepatobiliary: 2, 4 mm low-density lesions in the right lobe consistent with cysts. Focal fat adjacent to the falciform ligament. No other liver abnormality. There is diffuse gallbladder wall thickening, maximum of 9 mm. No convincing gallstone. No bile duct dilation. Pancreas: Unremarkable. No pancreatic ductal dilatation or surrounding inflammatory changes. Spleen: Normal in size without focal abnormality. Adrenals/Urinary Tract: No adrenal masses. Kidneys normal in overall size, orientation and position. 2.6 cm midpole right renal cyst. Small low-density lesion in the anterior midpole the left kidney, also likely a cyst but not fully characterized. No other renal masses or lesions. No hydronephrosis. Ureters normal in course and in caliber. Bladder is unremarkable, but partly obscured by artifact from bilateral hip prostheses. Stomach/Bowel: Mild haziness along wall of the  distal esophagus at the gastroesophageal junction. This is nonspecific. Stomach is unremarkable. Small bowel and colon are normal in caliber. No evidence of a mass. No wall thickening or inflammation. Normal appendix visualized. Vascular/Lymphatic: No significant vascular findings are present. No enlarged abdominal or pelvic lymph nodes. Reproductive: Uterus is heterogeneous. 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 Other: No abdominal wall hernia.  No ascites. Musculoskeletal: Status post L4-L5 fusion. Status post bilateral total hip arthroplasties. No fracture or acute finding. IMPRESSION: 1. Diffuse gallbladder wall thickening, maximum of 9 mm. The etiology of this is unclear. Recommend further assessment with limited right upper quadrant ultrasound. 2. Mild haziness along the wall of the lower esophagus at the gastroesophageal junction. This may reflect inflammation. Consider follow-up upper endoscopy for further assessment. 3. No evidence of a bowel mass, obstruction  or inflammation. No other findings to account for GI bleeding. Electronically Signed   By: Lajean Manes M.D.   On: 11/28/2018 19:26   Dg Hips Bilat With Pelvis 3-4 Views  Result Date: 11/29/2018 CLINICAL DATA:  Right greater than left hip pain after a fall 4 days ago. EXAM: DG HIP (WITH OR WITHOUT PELVIS) 3-4V BILAT COMPARISON:  CT abdomen and pelvis 11/28/2018. Right hip radiographs 09/27/2015. 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 No acute fracture or dislocation is identified. There is no lucency about the femoral stems to suggest loosening or infection. A prominent subchondral cyst superiorly in the left acetabulum is unchanged. Excreted IV contrast is noted in the bladder, and postsurgical changes are noted in the lower lumbar spine. 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 Electronically Signed   By: Logan Bores M.D.   On: 11/29/2018  09:49   US Abdomen Limited Ruq  Result Date: 11/29/2018 CLINICAL DATA:  Abnormal gallbladder on CT. EXAM: ULTRASOUND ABDOMEN LIMITED RIGHT UPPER QUADRANT COMPARISON:  CT 11/28/2018. FINDINGS: Gallbladder: Multiple tiny gallstones are noted. Gallbladder sludge noted. Gallbladder wall thickness is 14.1 mm suggesting cholecystitis. Negative Murphy sign. No pericholecystic fluid collection. Common bile duct: Diameter: 6.3 mm Liver: No focal lesion identified. Within normal limits in parenchymal echogenicity. Portal vein is patent on color Doppler imaging with normal direction of blood flow towards the liver. IMPRESSION: Multiple tiny gallstones noted. Gallbladder sludge noted. Gallbladder wall is thickened at 14.1 mm suggesting cholecystitis. No biliary distention. Electronically Signed   By: Marcello Moores  Register   On: 11/29/2018 06:02      LOS: 0 days   Time spent: More than 50% of that time was spent in counseling and/or coordination of care.  Antonieta Pert, MD [facility]  11/29/2018, 11:39 AM

## 2018-11-30 ENCOUNTER — Inpatient Hospital Stay (HOSPITAL_COMMUNITY): Payer: Medicare Other | Admitting: Anesthesiology

## 2018-11-30 ENCOUNTER — Inpatient Hospital Stay (HOSPITAL_COMMUNITY): Payer: Medicare Other

## 2018-11-30 ENCOUNTER — Encounter (HOSPITAL_COMMUNITY)
Admission: EM | Disposition: A | Discharge status: Home / Self Care | Payer: Self-pay | Source: Home / Self Care | Attending: Internal Medicine

## 2018-11-30 ENCOUNTER — Encounter (HOSPITAL_COMMUNITY): Payer: Self-pay | Admitting: Certified Registered"

## 2018-11-30 DIAGNOSIS — R945 Abnormal results of liver function studies: Secondary | ICD-10-CM

## 2018-11-30 HISTORY — PX: COLONOSCOPY WITH PROPOFOL: SHX5780

## 2018-11-30 HISTORY — PX: ESOPHAGOGASTRODUODENOSCOPY (EGD) WITH PROPOFOL: SHX5813

## 2018-11-30 HISTORY — PX: BIOPSY: SHX5522

## 2018-11-30 LAB — CBC
HCT: 33.9 % — ABNORMAL LOW (ref 36.0–46.0)
Hemoglobin: 11.1 g/dL — ABNORMAL LOW (ref 12.0–15.0)
MCH: 30.7 pg (ref 26.0–34.0)
MCHC: 32.7 g/dL (ref 30.0–36.0)
MCV: 93.6 fL (ref 80.0–100.0)
Platelets: 263 10*3/uL (ref 150–400)
RBC: 3.62 MIL/uL — ABNORMAL LOW (ref 3.87–5.11)
RDW: 15.7 % — ABNORMAL HIGH (ref 11.5–15.5)
WBC: 10.7 10*3/uL — ABNORMAL HIGH (ref 4.0–10.5)
nRBC: 0 % (ref 0.0–0.2)

## 2018-11-30 LAB — COMPREHENSIVE METABOLIC PANEL
ALT: 312 U/L — ABNORMAL HIGH (ref 0–44)
AST: 825 U/L — ABNORMAL HIGH (ref 15–41)
Albumin: 3.2 g/dL — ABNORMAL LOW (ref 3.5–5.0)
Alkaline Phosphatase: 160 U/L — ABNORMAL HIGH (ref 38–126)
Anion gap: 9 (ref 5–15)
BUN: 5 mg/dL — ABNORMAL LOW (ref 8–23)
CO2: 25 mmol/L (ref 22–32)
Calcium: 8.5 mg/dL — ABNORMAL LOW (ref 8.9–10.3)
Chloride: 108 mmol/L (ref 98–111)
Creatinine, Ser: 0.89 mg/dL (ref 0.44–1.00)
GFR calc Af Amer: >60 mL/min (ref >60)
GFR calc non Af Amer: >60 mL/min (ref >60)
Glucose, Bld: 96 mg/dL (ref 70–99)
Potassium: 3.6 mmol/L (ref 3.5–5.1)
Sodium: 142 mmol/L (ref 135–145)
Total Bilirubin: 4.4 mg/dL — ABNORMAL HIGH (ref 0.3–1.2)
Total Protein: 6.2 g/dL — ABNORMAL LOW (ref 6.5–8.1)

## 2018-11-30 LAB — T3, FREE: T3, Free: 2.5 pg/mL (ref 2.0–4.4)

## 2018-11-30 LAB — GLUCOSE, CAPILLARY: Glucose-Capillary: 99 mg/dL (ref 70–99)

## 2018-11-30 LAB — NOVEL CORONAVIRUS, NAA (HOSP ORDER, SEND-OUT TO REF LAB; TAT 18-24 HRS): SARS-CoV-2, NAA: NOT DETECTED

## 2018-11-30 SURGERY — ESOPHAGOGASTRODUODENOSCOPY (EGD) WITH PROPOFOL
Anesthesia: Monitor Anesthesia Care

## 2018-11-30 MED ORDER — PROPOFOL 10 MG/ML IV BOLUS
INTRAVENOUS | Status: DC | PRN
  Administered 2018-11-30: 30 mg via INTRAVENOUS
  Administered 2018-11-30: 40 mg via INTRAVENOUS
  Administered 2018-11-30: 30 mg via INTRAVENOUS
  Administered 2018-11-30 (×2): 50 mg via INTRAVENOUS

## 2018-11-30 MED ORDER — LIDOCAINE 2% (20 MG/ML) 5 ML SYRINGE
INTRAMUSCULAR | Status: DC | PRN
  Administered 2018-11-30 (×2): 50 mg via INTRAVENOUS

## 2018-11-30 MED ORDER — SODIUM CHLORIDE 0.9 % IV SOLN
INTRAVENOUS | Status: DC | PRN
  Administered 2018-11-30: 12:00:00 via INTRAVENOUS

## 2018-11-30 MED ORDER — PROPOFOL 500 MG/50ML IV EMUL
INTRAVENOUS | Status: DC | PRN
  Administered 2018-11-30: 75 ug/kg/min via INTRAVENOUS

## 2018-11-30 MED ORDER — ENSURE ENLIVE PO LIQD
237.0000 mL | Freq: Two times a day (BID) | ORAL | Status: DC
  Administered 2018-11-30 – 2018-12-04 (×6): 237 mL via ORAL

## 2018-11-30 MED ORDER — POTASSIUM CHLORIDE CRYS ER 20 MEQ PO TBCR
40.0000 meq | EXTENDED_RELEASE_TABLET | Freq: Once | ORAL | Status: AC
  Administered 2018-11-30: 40 meq via ORAL
  Filled 2018-11-30: qty 2

## 2018-11-30 SURGICAL SUPPLY — 25 items

## 2018-11-30 NOTE — Progress Notes (Signed)
Initial Nutrition Assessment  INTERVENTION:   Once diet advanced: provide Ensure Enlive po BID, each supplement provides 350 kcal and 20 grams of protein  NUTRITION DIAGNOSIS:   Inadequate oral intake related to poor appetite as evidenced by per patient/family report.  GOAL:   Patient will meet greater than or equal to 90% of their needs  MONITOR:   Diet advancement, Labs, Weight trends, I & O's  REASON FOR ASSESSMENT:   Malnutrition Screening Tool    ASSESSMENT:   73 y.o. female with medical history significant of hypertension, GERD, depression, anxiety, GI bleeding, gastric ulcer, insomnia, who presents with dark stool, and syncope.  **RD working remotely**  Patient currently having colonoscopy today d/t dark stools. Currently NPO. Per chart review, pt has had poor appetite for 2 weeks PTA. Once diet is advance, would order Ensure supplements.   Per patient, UBW is 135 lbs. Pt has lost 19 lbs over 2 weeks (14% wt loss x 2 weeks, significant for time frame).   Labs reviewed. Medications: K-DUR tablet once   NUTRITION - FOCUSED PHYSICAL EXAM:  Unable to perform per department requirement to work remotely.  Diet Order:   Diet Order            Diet NPO time specified  Diet effective midnight              EDUCATION NEEDS:   No education needs have been identified at this time  Skin:  Skin Assessment: Reviewed RN Assessment  Last BM:  6/16  Height:   Ht Readings from Last 1 Encounters:  11/28/18 5\' 4"  (1.626 m)    Weight:   Wt Readings from Last 1 Encounters:  11/30/18 56.7 kg    Ideal Body Weight:  54.5 kg  BMI:  Body mass index is 21.46 kg/m.  Estimated Nutritional Needs:   Kcal:  3532-9924  Protein:  65-75g  Fluid:  1.5L/day  Clayton Bibles, MS, RD, LDN [facility] Pager: [phone] After Hours Pager: [phone]

## 2018-11-30 NOTE — Op Note (Signed)
EGD showed Normal esophagus and Z line Small hiatal hernia Antral gastritis, biopsies taken for H pylori. Unremarkable duodenum  Colonsocopy showed Hepatic flexure and descending colon polyps- removed Internal hemorrhoids.   Increase in LFTs noted,TB/AST/ALT/ALP of 4.4/825/312/160, will order MRCP. USG from 11/28/2018 showed CBD of 6.3 mm with multiple tiny gallstones and GB sludge.  [name], MD

## 2018-11-30 NOTE — Interval H&P Note (Signed)
History and Physical Interval Note: 73/female with intermittent dark stools,blood in stool, family history of colon cancer in 3 first degree relatives for an EGD and colonoscopy today.  11/30/2018 12:04 PM  Brandi [name]  has presented today for EGD and colonoscopy, with the diagnosis of anemia, dark stool, blood in stool, family history of colon cancer in father, mother and sister.  The various methods of treatment have been discussed with the patient and family. After consideration of risks, benefits and other options for treatment, the patient has consented to  Procedure(s): ESOPHAGOGASTRODUODENOSCOPY (EGD) WITH PROPOFOL (N/A) COLONOSCOPY WITH PROPOFOL (N/A) as a surgical intervention.  The patient's history has been reviewed, patient examined, no change in status, stable for surgery.  I have reviewed the patient's chart and labs.  Questions were answered to the patient's satisfaction.     Brandi [name]

## 2018-11-30 NOTE — Op Note (Signed)
[facility] Patient Name: Brandi [name] Procedure Date : 11/30/2018 MRN: [id number] Attending MD: [name] , MD Date of Birth: 05-Feb-1946 CSN: [id number] Age: 73 Admit Type: Inpatient Procedure:                Colonoscopy Indications:              Last colonoscopy: 2015, Inermittent rectal                            bleeding, Family history of colon cancer in                            multiple first-degree relatives(father, mother and                            sister) Providers:                [name], MD, Baird [facility], RN, Marguerita Merles,                            Technician, William Dalton, Technician Referring MD:              Medicines:                Monitored Anesthesia Care Complications:            No immediate complications. Estimated blood loss:                            None. Estimated Blood Loss:     Estimated blood loss: none. Procedure:                Pre-Anesthesia Assessment:                           - Prior to the procedure, a History and Physical                            was performed, and patient medications and                            allergies were reviewed. The patient's tolerance of                            previous anesthesia was also reviewed. The risks                            and benefits of the procedure and the sedation                            options and risks were discussed with the patient.                            All questions were answered, and informed consent                            was obtained. Prior Anticoagulants: The patient has  taken no previous anticoagulant or antiplatelet                            agents. ASA Grade Assessment: III - A patient with                            severe systemic disease. After reviewing the risks                            and benefits, the patient was deemed in                            satisfactory condition to undergo the procedure.                      After obtaining informed consent, the colonoscope                            was passed under direct vision. Throughout the                            procedure, the patient's blood pressure, pulse, and                            oxygen saturations were monitored continuously. The                            [id number]-[id number] ([id number]) Olympus pediatric colonoscope                            was introduced through the anus and advanced to the                            the cecum, identified by appendiceal orifice and                            ileocecal valve. The colonoscopy was performed                            without difficulty. The patient tolerated the                            procedure well. The quality of the bowel                            preparation was fair. Scope In: 12:31:40 PM Scope Out: 12:51:35 PM Scope Withdrawal Time: 0 hours 13 minutes 34 seconds  Total Procedure Duration: 0 hours 19 minutes 55 seconds  Findings:      The perianal and digital rectal examinations were normal.      A 7 mm polyp was found in the hepatic flexure. The polyp was sessile.       The polyp was removed with a piecemeal technique using a cold biopsy       forceps. Resection and retrieval were complete.      A  6 mm polyp was found in the descending colon. The polyp was sessile.       The polyp was removed with a piecemeal technique using a cold biopsy       forceps. Resection and retrieval were complete.      Non-bleeding internal hemorrhoids were found during retroflexion. The       hemorrhoids were small.      The exam was otherwise without abnormality. Impression:               - Preparation of the colon was fair.                           - One 7 mm polyp at the hepatic flexure, removed                            piecemeal using a cold biopsy forceps. Resected and                            retrieved.                           - One 6 mm polyp in the descending colon,  removed                            piecemeal using a cold biopsy forceps. Resected and                            retrieved.                           - Non-bleeding internal hemorrhoids.                           - The examination was otherwise normal. Moderate Sedation:      Patient did not receive moderate sedation for this procedure, but       instead received monitored anesthesia care. Recommendation:           - Resume regular diet.                           - Continue present medications.                           - Await pathology results.                           - Repeat colonoscopy for surveillance based on                            pathology results. Procedure Code(s):        --- Professional ---                           [phone], Colonoscopy, flexible; with biopsy, single                            or multiple Diagnosis Code(s):        ---  Professional ---                           K64.8, Other hemorrhoids                           K63.5, Polyp of colon                           K62.5, Hemorrhage of anus and rectum                           Z80.0, Family history of malignant neoplasm of                            digestive organs CPT copyright 2019 [employer] Association. All rights reserved. The codes documented in this report are preliminary and upon coder review may  be revised to meet current compliance requirements. [name], MD 11/30/2018 1:00:20 PM This report has been signed electronically. Number of Addenda: 0

## 2018-11-30 NOTE — Transfer of Care (Signed)
Immediate Anesthesia Transfer of Care Note  Patient: DAZHA KEMPA  Procedure(s) Performed: ESOPHAGOGASTRODUODENOSCOPY (EGD) WITH PROPOFOL (N/A ) COLONOSCOPY WITH PROPOFOL (N/A ) BIOPSY  Patient Location: Endoscopy Unit  Anesthesia Type:MAC  Level of Consciousness: awake and patient cooperative  Airway & Oxygen Therapy: Patient Spontanous Breathing and Patient connected to nasal cannula oxygen  Post-op Assessment: Report given to RN and Post -op Vital signs reviewed and stable  Post vital signs: Reviewed and stable  Last Vitals:  Vitals Value Taken Time  BP 138/82 11/30/18 1257  Temp    Pulse    Resp 18 11/30/18 1257  SpO2    Vitals shown include unvalidated device data.  Last Pain:  Vitals:   11/30/18 1114  TempSrc: Temporal  PainSc: 9       Patients Stated Pain Goal: 0 (02/58/52 7782)  Complications: No apparent anesthesia complications

## 2018-11-30 NOTE — Progress Notes (Signed)
PROGRESS NOTE    Brandi [name]  ZOX:[id number] DOB: 08/26/1945 DOA: 11/28/2018 PCP: Vincente Liberty, MD   Brief Narrative:  73 y.o. female with medical history significant of hypertension, GERD, depression, anxiety, GI bleeding, gastric ulcer, insomnia, who presents with dark stool, and syncope. Patient having intermittent dark tarry stool for more than 2 weeks small amount of bright red blood on 6/14.  Has lost about 10 pounds recently has had poor appetite decreased intake.  In the ER she had work-up along with CT scan and on return from CT scan appeared to have "syncopal episode" was found to be "not responsive" by technician.  in ER, significantly hypokalemic, hemoglobin 12.5 (10.7 on 04/18/2016), negative FOBT,sent COVID-19 test, heart rate 43- 101, oxygen saturation 100% on room air, blood pressure 163/70 GI was consulted regarding possible GI bleed.  Since imaging studies showed gallbladder wall thickening and ultrasound showed possible cholecystitis, HIDA scan was obtained and general surgery was consulted given normal HIDA scan with gallbladder EF 0%. 6/16 -EGD/COLO  Subjective: This point patient has no abdominal pain chest pain nausea vomiting.  She feels well.  She is waiting for colonoscopy/endoscopy. Noted uptrending LFTs  Assessment & Plan:   Syncope after returning from CT scan in the ER: Unclear etiology could be from electrolyte imbalance/?  Arrhythmia.  EKG has prolonged QTC.  Follow-up 2D echocardiogram.  Check orthostatic vital signs, continue IV fluids. Patient has pacer pads in place.  Cardiology consulted  Dark stools more than 2 weeks at home, suspect GI bleeding. But FOBT negative in ER. Currently no bowel movement.hb  On admission 12.5 (10.7 on 04/18/2016), and hemoglobin overall stable.  For EGD and colonoscopy today.  PPI Recent Labs  Lab 11/28/18 1545 11/29/18 0235 11/29/18 0913 11/29/18 1500 11/30/18 0456  HGB 12.5 10.9* 10.7* 10.3* 11.1*  HCT 36.9 32.3*  32.5* 30.6* 33.9*   Hypokalemia, unknown etiology 2.8, replaced aggressively and has improved.she reports she has had similar hypokalemia in the past and was taking supplementation but has not needed recently.  Repeat this afternoon.  Acute kidney injury with creatinine 1.3,improved with hydration.  QT prolongation at 538 :Monitor electrolytes, repleted potassium aggressively. Repeat EKG in am.Mag is stable.  Cholelithiasis with thickened gallbladder wall ultrasound reported as acute cholecystitis.  Unclear etiology.patient does not complain of significant abdominal pain.  Noted elevated LFTs and HIDA scan taht came back with abnormal ejection fraction of 0% otherwise able to visualize liver, mediastinum, gallbladder and bowel. Discussed w GI and consulted Gen surgery, following. MRCP ordered.  Negative for CBD stone surgery is planning for lap chole and IOC pending cardiology clearance  Transaminitis: LFTs uptrending tb/ast/alt/alp  1.3/161/64/98.> 4.4/824/312/160.CBD 6.3 mm ultrasound.  Appreciate GI input obtaining MRCP.  Elevated troponin 0.1-->0.05, also with chest pain. Likely demand mismatch. Cardiology has been consulted for further recommendation.  Aspirin has not been started due to dark stool/GI bleed concern.   Recent fall and pain in the right hip, obtained hip x-ray and appears stable.She reports she has had hip surgeries and is followed by her orthopedic surgery.  Pain control  GERD: PPI  Essential hypertension, benign: Blood pressure is controlled.  Insomnia/Anxiety: On as needed Valium at home.  Hold home Restoril due to prolonged QTC.  Suppressed TSH at 0.28 , normal free T4 T3  DVT prophylaxis: SCD Code Status: full code Family Communication: plan of care discussed with the patient Disposition Plan: Remains inpatient pending clinical improvement.   Consultants:  Cardiology  Procedures:  EGD  Normal esophagus and Z line Small hiatal hernia Antral gastritis,  biopsies taken for H pylori. Unremarkable duodenum  Colonsocopy  Hepatic flexure and descending colon polyps- removed Internal hemorrhoids.  CT abdomen/pelvis: 1.Diffuse gallbladder wall thickening, maximum of 9 mm. The etiology of this is unclear. Recommend further assessment with limited right upper quadrant ultrasound. 2. Mild haziness along the wall of the lower esophagus at the gastroesophageal junction. This may reflect inflammation. Consider follow-up upper endoscopy for further assessment. 3. No evidence of a bowel mass, obstruction or inflammation. No other findings to account for GI bleeding.  RUQ [place] Multiple tiny gallstones noted. Gallbladder sludge noted. Gallbladder wall is thickened at 14.1 mm suggesting cholecystitis. No biliary distention  PAST 2016 ENDOSCOPIC  small hiatal hernia with no source of upper GI tract bleeding noted. RECOMMENDATIONS: monitor stools and hemoglobin and consider capsule endoscopy if continues to have melenic stools associated with drop in hemoglobin.  Antimicrobials: Anti-infectives (From admission, onward)   None       Objective: Vitals:   11/30/18 1257 11/30/18 1310 11/30/18 1318 11/30/18 1344  BP: 138/82 (!) 137/97 (!) 147/80 (!) 153/83  Pulse: 80 75 73   Resp: 18 (!) 21 14   Temp: 98 F (36.7 C)     TempSrc: Temporal     SpO2: 100% 98% 100%   Weight:      Height:        Intake/Output Summary (Last 24 hours) at 11/30/2018 1416 Last data filed at 11/30/2018 1257 Gross per 24 hour  Intake 2435.71 ml  Output 1100 ml  Net 1335.71 ml   Filed Weights   11/28/18 2233 11/30/18 0558  Weight: 52.7 kg 56.7 kg   Weight change: 4 kg  Body mass index is 21.46 kg/m.  Intake/Output from previous day: 06/15 0701 - 06/16 0700 In: 1935.7 [I.V.:1760.2; IV Piggyback:175.5] Out: 1100 [Urine:1100] Intake/Output this shift: Total I/O In: 500 [I.V.:500] Out: -   Examination:  General exam: Appears calm and comfortable,Not in  distress HEENT:PERRL,Oral mucosa moist, Ear/Nose normal on gross exam Respiratory system: Bilateral equal air entry, normal vesicular breath sounds, no wheezes or crackles  Cardiovascular system: S1 & S2 heard,No JVD, murmurs. Gastrointestinal system: Abdomen is  soft, tender in epigastric and right abdomen with deep palpationbut  no Murphy sign, non distended, BS +  Nervous System:Alert and oriented. No focal neurological deficits/moving extremities, sensation intact. Extremities: No edema, no clubbing, distal peripheral pulses palpable. Skin: No rashes, lesions, no icterus MSK: Normal muscle bulk,tone ,power  Medications:  Scheduled Meds: . estrogen (conjugated)-medroxyprogesterone  1 tablet Oral QHS  . feeding supplement (ENSURE ENLIVE)  237 mL Oral BID BM  . pantoprazole  40 mg Oral BID  . polyethylene glycol-electrolytes  2,000 mL Oral Once  . sodium chloride flush  3 mL Intravenous Q12H   Continuous Infusions: . sodium chloride 100 mL/hr at 11/30/18 7654    Data Reviewed: I have personally reviewed following labs and imaging studies  CBC: Recent Labs  Lab 11/28/18 1545 11/29/18 0235 11/29/18 0913 11/29/18 1500 11/30/18 0456  WBC 10.0 10.0 7.8 7.5 10.7*  HGB 12.5 10.9* 10.7* 10.3* 11.1*  HCT 36.9 32.3* 32.5* 30.6* 33.9*  MCV 91.8 90.5 92.6 93.0 93.6  PLT 352 310 275 290 650   Basic Metabolic Panel: Recent Labs  Lab 11/28/18 1545 11/29/18 0235 11/29/18 0424 11/29/18 0913 11/29/18 1500 11/29/18 2107 11/30/18 0456  NA 138  --  141  --  142 141 142  K 2.6* 2.6* 2.8*  --  3.3* 3.4* 3.6  CL 91*  --  104  --  107 108 108  CO2 33*  --  26  --  27 24 25   GLUCOSE 110*  --  89  --  107* 103* 96  BUN 9  --  5*  --  <5* 5* 5*  CREATININE 1.31*  --  1.08*  --  1.04* 0.95 0.89  CALCIUM 10.1  --  8.3*  --  8.6* 8.4* 8.5*  MG 2.0  --   --  2.2  --   --   --    GFR: Estimated Creatinine Clearance: 50.1 mL/min (by C-G formula based on SCr of 0.89 mg/dL). Liver Function  Tests: Recent Labs  Lab 11/28/18 1545 11/29/18 0913 11/30/18 0456  AST 30 161* 825*  ALT 16 64* 312*  ALKPHOS 55 98 160*  BILITOT 1.4* 1.3* 4.4*  PROT 8.1 6.2* 6.2*  ALBUMIN 4.3 3.2* 3.2*   No results for input(s): LIPASE, AMYLASE in the last 168 hours. No results for input(s): AMMONIA in the last 168 hours. Coagulation Profile: Recent Labs  Lab 11/29/18 0235  INR 1.1   Cardiac Enzymes: Recent Labs  Lab 11/29/18 0235 11/29/18 0913  TROPONINI 0.10* 0.05*   BNP (last 3 results) No results for input(s): PROBNP in the last 8760 hours. HbA1C: Recent Labs    11/29/18 0235  HGBA1C 5.2   CBG: Recent Labs  Lab 11/28/18 1912 11/30/18 0806  GLUCAP 125* 99   Lipid Profile: Recent Labs    11/29/18 0235  CHOL 207*  HDL 80  LDLCALC 113*  TRIG 71  CHOLHDL 2.6   Thyroid Function Tests: Recent Labs    11/29/18 0235 11/29/18 1500  TSH 0.285*  --   FREET4  --  0.99  T3FREE  --  2.5   Anemia Panel: No results for input(s): VITAMINB12, FOLATE, FERRITIN, TIBC, IRON, RETICCTPCT in the last 72 hours. Sepsis Labs: No results for input(s): PROCALCITON, LATICACIDVEN in the last 168 hours.  Recent Results (from the past 240 hour(s))  Novel Coronavirus,NAA,(SEND-OUT TO REF LAB - TAT 24-48 hrs); Hosp Order     Status: None   Collection Time: 11/28/18  8:05 PM   Specimen: Nasopharyngeal Swab; Respiratory  Result Value Ref Range Status   SARS-CoV-2, NAA NOT DETECTED NOT DETECTED Final    Comment: (NOTE) This test was developed and its performance characteristics determined by [employer]. This test has not been FDA cleared or approved. This test has been authorized by FDA under an Emergency Use Authorization (EUA). This test is only authorized for the duration of time the declaration that circumstances exist justifying the authorization of the emergency use of in vitro diagnostic tests for detection of SARS-CoV-2 virus and/or diagnosis of COVID-19 infection  under section 564(b)(1) of the Act, 21 U.S.C. 283TDV-7(O)(1), unless the authorization is terminated or revoked sooner. When diagnostic testing is negative, the possibility of a false negative result should be considered in the context of a patient's recent exposures and the presence of clinical signs and symptoms consistent with COVID-19. An individual without symptoms of COVID-19 and who is not shedding SARS-CoV-2 virus would expect to have a negative (not detected) result in this assay. Performed  At: [facility] [address] [place], [place] [id number] Rush Farmer MD IR:[id number]    [place]  Final    Comment: Performed at [facility], [place] [address] [place], [place] [address]      Radiology Studies: Ct  Abdomen Pelvis W Contrast  Result Date: 11/28/2018 CLINICAL DATA:  GI bleed. Weight loss abdominal pain. Black tarry stools for 1 week. Bright red blood per rectum 2 weeks before that. EXAM: CT ABDOMEN AND PELVIS WITH CONTRAST TECHNIQUE: Multidetector CT imaging of the abdomen and pelvis was performed using the standard protocol following bolus administration of intravenous contrast. CONTRAST:  10mL OMNIPAQUE IOHEXOL 300 MG/ML  SOLN COMPARISON:  CT, 06/15/2014 FINDINGS: Lower chest: Lung bases essentially clear. Heart normal size. Pectus excavatum deformity. Hepatobiliary: 2, 4 mm low-density lesions in the right lobe consistent with cysts. Focal fat adjacent to the falciform ligament. No other liver abnormality. There is diffuse gallbladder wall thickening, maximum of 9 mm. No convincing gallstone. No bile duct dilation. Pancreas: Unremarkable. No pancreatic ductal dilatation or surrounding inflammatory changes. Spleen: Normal in size without focal abnormality. Adrenals/Urinary Tract: No adrenal masses. Kidneys normal in overall size, orientation and position. 2.6 cm midpole right renal cyst. Small low-density lesion in the anterior midpole the  left kidney, also likely a cyst but not fully characterized. No other renal masses or lesions. No hydronephrosis. Ureters normal in course and in caliber. Bladder is unremarkable, but partly obscured by artifact from bilateral hip prostheses. Stomach/Bowel: Mild haziness along wall of the distal esophagus at the gastroesophageal junction. This is nonspecific. Stomach is unremarkable. Small bowel and colon are normal in caliber. No evidence of a mass. No wall thickening or inflammation. Normal appendix visualized. Vascular/Lymphatic: No significant vascular findings are present. No enlarged abdominal or pelvic lymph nodes. Reproductive: Uterus is heterogeneous. 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 Other: No abdominal wall hernia.  No ascites. Musculoskeletal: Status post L4-L5 fusion. Status post bilateral total hip arthroplasties. No fracture or acute finding. IMPRESSION: 1. Diffuse gallbladder wall thickening, maximum of 9 mm. The etiology of this is unclear. Recommend further assessment with limited right upper quadrant ultrasound. 2. Mild haziness along the wall of the lower esophagus at the gastroesophageal junction. This may reflect inflammation. Consider follow-up upper endoscopy for further assessment. 3. No evidence of a bowel mass, obstruction or inflammation. No other findings to account for GI bleeding. Electronically Signed   By: Lajean Manes M.D.   On: 11/28/2018 19:26   Nm Hepato W/eject Fract  Result Date: 11/29/2018 CLINICAL DATA:  Loss of appetite.  Weight loss. EXAM: NUCLEAR MEDICINE HEPATOBILIARY IMAGING WITH GALLBLADDER EF TECHNIQUE: Sequential images of the abdomen were obtained out to 60 minutes following intravenous administration of radiopharmaceutical. After oral ingestion of Ensure, gallbladder ejection fraction was determined. At 60 min, normal ejection fraction is greater than 33%. RADIOPHARMACEUTICALS:  5.1 mCi Tc-62m  Choletec IV COMPARISON:   Ultrasound 11/29/2018. FINDINGS: Prompt uptake and biliary excretion of activity by the liver is seen. Gallbladder activity is visualized, consistent with patency of cystic duct. Biliary activity passes into small bowel, consistent with patent common bile duct. Calculated gallbladder ejection fraction is 0%. (Normal gallbladder ejection fraction with Ensure is greater than 33%.) IMPRESSION: 1.  Abnormal gallbladder ejection fraction at 0%. 2. Liver, biliary system, gallbladder, and bowel visualize normally. Electronically Signed   By: Marcello Moores  Register   On: 11/29/2018 14:39   Dg Hips Bilat With Pelvis 3-4 Views  Result Date: 11/29/2018 CLINICAL DATA:  Right greater than left hip pain after a fall 4 days ago. EXAM: DG HIP (WITH OR WITHOUT PELVIS) 3-4V BILAT COMPARISON:  CT abdomen and pelvis 11/28/2018. Right hip radiographs 09/27/2015. 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 No  acute fracture or dislocation is identified. There is no lucency about the femoral stems to suggest loosening or infection. A prominent subchondral cyst superiorly in the left acetabulum is unchanged. Excreted IV contrast is noted in the bladder, and postsurgical changes are noted in the lower lumbar spine. 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 Electronically Signed   By: Logan Bores M.D.   On: 11/29/2018 09:49   US Abdomen Limited Ruq  Result Date: 11/29/2018 CLINICAL DATA:  Abnormal gallbladder on CT. EXAM: ULTRASOUND ABDOMEN LIMITED RIGHT UPPER QUADRANT COMPARISON:  CT 11/28/2018. FINDINGS: Gallbladder: Multiple tiny gallstones are noted. Gallbladder sludge noted. Gallbladder wall thickness is 14.1 mm suggesting cholecystitis. Negative Murphy sign. No pericholecystic fluid collection. Common bile duct: Diameter: 6.3 mm Liver: No focal lesion identified. Within normal limits in parenchymal echogenicity. Portal vein is patent on color Doppler imaging with normal  direction of blood flow towards the liver. IMPRESSION: Multiple tiny gallstones noted. Gallbladder sludge noted. Gallbladder wall is thickened at 14.1 mm suggesting cholecystitis. No biliary distention. Electronically Signed   By: Marcello Moores  Register   On: 11/29/2018 06:02      LOS: 1 day   Time spent: More than 50% of that time was spent in counseling and/or coordination of care.  Antonieta Pert, MD [facility]  11/30/2018, 2:16 PM

## 2018-11-30 NOTE — Progress Notes (Signed)
Day of Surgery    CC: Black tarry stools cholelithiasis with abnormal HIDA ejection fraction 0%  Subjective: On her way down to endoscopy now.  She says her pain is much better with the morphine.  No complaints of nausea or vomiting.  Objective: Vital signs in last 24 hours: Temp:  [98.1 F (36.7 C)-99.4 F (37.4 C)] 99.3 F (37.4 C) (06/16 0808) Pulse Rate:  [72-98] 78 (06/16 0808) Resp:  [16] 16 (06/16 0808) BP: (143-169)/(85-100) 169/100 (06/16 0808) SpO2:  [99 %-100 %] 100 % (06/16 0808) Weight:  [56.7 kg] 56.7 kg (06/16 0558) Last BM Date: 11/30/18 NPO 1935 IV 1100 urine Afebrile, VSS BP up and poorly controlled LFT's going up CMP Latest Ref Rng & Units 11/30/2018 11/29/2018 11/29/2018  Glucose 70 - 99 mg/dL 96 103(H) 107(H)  BUN 8 - 23 mg/dL 5(L) 5(L) <5(L)  Creatinine 0.44 - 1.00 mg/dL 0.89 0.95 1.04(H)  Sodium 135 - 145 mmol/L 142 141 142  Potassium 3.5 - 5.1 mmol/L 3.6 3.4(L) 3.3(L)  Chloride 98 - 111 mmol/L 108 108 107  CO2 22 - 32 mmol/L 25 24 27   Calcium 8.9 - 10.3 mg/dL 8.5(L) 8.4(L) 8.6(L)  Total Protein 6.5 - 8.1 g/dL 6.2(L) - -  Total Bilirubin 0.3 - 1.2 mg/dL 4.4(H) - -  Alkaline Phos 38 - 126 U/L 160(H) - -  AST 15 - 41 U/L 825(H) - -  ALT 0 - 44 U/L 312(H) - -  HIDA: 1.  Abnormal gallbladder ejection fraction at 0%.  2. Liver, biliary system, gallbladder, and bowel visualize normally.   Intake/Output from previous day: 06/15 0701 - 06/16 0700 In: 1935.7 [I.V.:1760.2; IV Piggyback:175.5] Out: 1100 [Urine:1100] Intake/Output this shift: No intake/output data recorded.  General appearance: alert, cooperative, no distress and In the wheelchair in route to endoscopy.  Further physical exam was not performed.  Lab Results:  Recent Labs    11/29/18 1500 11/30/18 0456  WBC 7.5 10.7*  HGB 10.3* 11.1*  HCT 30.6* 33.9*  PLT 290 263    BMET Recent Labs    11/29/18 2107 11/30/18 0456  NA 141 142  K 3.4* 3.6  CL 108 108  CO2 24 25  GLUCOSE  103* 96  BUN 5* 5*  CREATININE 0.95 0.89  CALCIUM 8.4* 8.5*   PT/INR Recent Labs    11/29/18 0235  LABPROT 13.7  INR 1.1    Recent Labs  Lab 11/28/18 1545 11/29/18 0913 11/30/18 0456  AST 30 161* 825*  ALT 16 64* 312*  ALKPHOS 55 98 160*  BILITOT 1.4* 1.3* 4.4*  PROT 8.1 6.2* 6.2*  ALBUMIN 4.3 3.2* 3.2*     Lipase  No results found for: LIPASE   Medications: . estrogen (conjugated)-medroxyprogesterone  1 tablet Oral QHS  . pantoprazole  40 mg Oral BID  . polyethylene glycol-electrolytes  2,000 mL Oral Once  . sodium chloride flush  3 mL Intravenous Q12H    Assessment/Plan Acute kidney injury Syncope/mild troponin elevation/QTc prolongation - 17 beats SVT 11/30/18 Hypokalemia - being replaced Hx hypertension Suppressed TSH Hx gastric ulcers/GERD Strong family hx of colon cancer Hx anxiety/depression/insomnia Remote hx of tobacco use COVID - neg 07/2018 & 11/28/18   Dark stools/possible rectal bleeding Cholelithiasis/gallbladder wall thickening/abnormal gallbladder function Abnormal LFTs:   T bil 1.3(6/14) >> 1.3(6/15) >> 4.4(6/16)          AST: 30 (6/14)>> 161(6/15) >> 825(6/16)          ALT:  16(6/14) >>64(6/15) >>312(6/16)  Alk Phos:  55(6/14) >> 98 6/15) >>160(6/16) FEN: IV fluids/clear liquids/n.p.o. after midnight ID: None: DVT: SCDs. Follow-up: TBD POC: [name],[name] Spouse [phone]  [phone]  [name],[name] Sister   [phone]    Plan:  She is in Endoscopy currently we will continue to follow with you.  Await GI input.   Cardiology plans further testing when she is stable from a GI standpoint.Discussed with Dr.Karki, she will order  MRCP.      LOS: 1 day    Brandi [name] 11/30/2018 [id number]

## 2018-11-30 NOTE — Brief Op Note (Signed)
11/28/2018 - 11/30/2018  12:54 PM  PATIENT:  Brandi [name]  73 y.o. female  PRE-OPERATIVE DIAGNOSIS:  anemia, dark stool, blood in stool, family history of colon cancer in father, mother and sister  POST-OPERATIVE DIAGNOSIS:  EGD- Gastritis, biopsy taken at gastric antrum r/o H.pylori, hiatal hernia. Colon- Hemorrhoids, hepatic flexure polyp removed, descending colon polyp removed   PROCEDURE:  Procedure(s): ESOPHAGOGASTRODUODENOSCOPY (EGD) WITH PROPOFOL (N/A) COLONOSCOPY WITH PROPOFOL (N/A) BIOPSY  SURGEON:  Surgeon(s) and Role:    [name], MD - Primary  PHYSICIAN ASSISTANT:   ASSISTANTS: Baird Cancer, RN, William Dalton, Tech   ANESTHESIA:   MAC  EBL:  Minimal  BLOOD ADMINISTERED:none  DRAINS: none   LOCAL MEDICATIONS USED:  NONE  SPECIMEN:  Biopsy / Limited Resection  DISPOSITION OF SPECIMEN:  PATHOLOGY  COUNTS:  YES  TOURNIQUET:  * No tourniquets in log *  DICTATION: .Dragon Dictation  PLAN OF CARE: Admit to inpatient   PATIENT DISPOSITION:  PACU - hemodynamically stable.   Delay start of Pharmacological VTE agent (>24hrs) due to surgical blood loss or risk of bleeding: not applicable

## 2018-11-30 NOTE — Progress Notes (Signed)
17 beats NSVT noted on telemetry.  Last K+ 3.4, Mag 2.2  K. Schorr, NP notified.  No new orders received.  Will continue to monitor.  Jodell Cipro

## 2018-11-30 NOTE — Anesthesia Postprocedure Evaluation (Signed)
Anesthesia Post Note  Patient: Brandi [name]  Procedure(s) Performed: ESOPHAGOGASTRODUODENOSCOPY (EGD) WITH PROPOFOL (N/A ) COLONOSCOPY WITH PROPOFOL (N/A ) BIOPSY     Patient location during evaluation: PACU Anesthesia Type: MAC Level of consciousness: awake and alert Pain management: pain level controlled Vital Signs Assessment: post-procedure vital signs reviewed and stable Respiratory status: spontaneous breathing, nonlabored ventilation, respiratory function stable and patient connected to nasal cannula oxygen Cardiovascular status: stable and blood pressure returned to baseline Postop Assessment: no apparent nausea or vomiting Anesthetic complications: no    Last Vitals:  Vitals:   11/30/18 1318 11/30/18 1344  BP: (!) 147/80 (!) 153/83  Pulse: 73   Resp: 14   Temp:    SpO2: 100%                  Effie Berkshire

## 2018-11-30 NOTE — Op Note (Signed)
[facility] Patient Name: Brandi [name] Procedure Date : 11/30/2018 MRN: [id number] Attending MD: [name] , MD Date of Birth: 1945/12/16 CSN: [id number] Age: 73 Admit Type: Inpatient Procedure:                Upper GI endoscopy Indications:              Anemia, Heartburn, Follow-up of gastro-esophageal                            reflux disease, intermittent dark stools Providers:                [name], MD, Baird Cancer, RN, Marguerita Merles,                            Technician, William Dalton, Technician Referring MD:              Medicines:                Monitored Anesthesia Care Complications:            No immediate complications. Estimated blood loss:                            None. Estimated Blood Loss:     Estimated blood loss: none. Procedure:                Pre-Anesthesia Assessment:                           - Prior to the procedure, a History and Physical                            was performed, and patient medications and                            allergies were reviewed. The patient's tolerance of                            previous anesthesia was also reviewed. The risks                            and benefits of the procedure and the sedation                            options and risks were discussed with the patient.                            All questions were answered, and informed consent                            was obtained. Prior Anticoagulants: The patient has                            taken no previous anticoagulant or antiplatelet                            agents.  ASA Grade Assessment: III - A patient with                            severe systemic disease. After reviewing the risks                            and benefits, the patient was deemed in                            satisfactory condition to undergo the procedure.                           After obtaining informed consent, the endoscope was                             passed under direct vision. Throughout the                            procedure, the patient's blood pressure, pulse, and                            oxygen saturations were monitored continuously. The                            [id number]-H190 ([id number] Olympus gastroscope was                            introduced through the mouth, and advanced to the                            second part of duodenum. The upper GI endoscopy was                            accomplished without difficulty. The patient                            tolerated the procedure well. Scope In: Scope Out: Findings:      The examined esophagus was normal.      The Z-line was regular and was found 38 cm from the incisors.      A 2 cm hiatal hernia was present.      Localized mildly erythematous mucosa without bleeding was found in the       gastric antrum. Biopsies were taken with a cold forceps for Helicobacter       pylori testing.      The cardia and gastric fundus were normal on retroflexion.      The examined duodenum was normal. Impression:               - Normal esophagus.                           - Z-line regular, 38 cm from the incisors.                           - 2 cm hiatal hernia.                           -  Erythematous mucosa in the antrum. Biopsied.                           - Normal examined duodenum. Moderate Sedation:      Patient did not receive moderate sedation for this procedure, but       instead received monitored anesthesia care. Recommendation:           - Resume regular diet.                           - Continue present medications.                           - Await pathology results.                           - Aggressive anti reflux measures such as weight                            loss, elevate head end of the bed during sleep,                            avoid or limit caffeinated products and space last                            meal of the day and bedtime by at least 3  hours. Procedure Code(s):        --- Professional ---                           [id number], Esophagogastroduodenoscopy, flexible,                            transoral; with biopsy, single or multiple Diagnosis Code(s):        --- Professional ---                           K44.9, Diaphragmatic hernia without obstruction or                            gangrene                           K31.89, Other diseases of stomach and duodenum                           D50.0, Iron deficiency anemia secondary to blood                            loss (chronic)                           R12, Heartburn                           K21.9, Gastro-esophageal reflux disease without  esophagitis CPT copyright 2019 [employer]. All rights reserved. The codes documented in this report are preliminary and upon coder review may  be revised to meet current compliance requirements. [name], MD 11/30/2018 12:57:54 PM This report has been signed electronically. Number of Addenda: 0

## 2018-11-30 NOTE — Progress Notes (Addendum)
   Per Dr. Francesca Oman note, will plan for coronary CT following GI work-up. Troponin flat/down-trending at 0.10 >> 0.05. Patient had 17 beats of non-sustained VT this morning. Otherwise, telemetry reveals sinus rhythm. Potassium 3.6 today and Magnesium 2.2 yesterday. Patient has been hypokalemic this admission with potassium as low as 2.6. Will give another dose of K-Dur 40 mEq today. Potassium goal >4.0.  Will recheck BMET tomorrow morning. Will see patient tomorrow after GI work-up and once COVID testing has come back.  Darreld Mclean, PA-C 11/30/2018 8:38 AM Pager: [phone]

## 2018-11-30 NOTE — Anesthesia Procedure Notes (Signed)
Procedure Name: MAC Date/Time: 11/30/2018 12:24 PM Performed by: Orlie Dakin, CRNA Pre-anesthesia Checklist: Patient identified, Emergency Drugs available, Suction available and Patient being monitored Patient Re-evaluated:Patient Re-evaluated prior to induction Oxygen Delivery Method: Nasal cannula Preoxygenation: Pre-oxygenation with 100% oxygen Induction Type: IV induction

## 2018-12-01 DIAGNOSIS — K810 Acute cholecystitis: Secondary | ICD-10-CM

## 2018-12-01 DIAGNOSIS — R079 Chest pain, unspecified: Secondary | ICD-10-CM

## 2018-12-01 DIAGNOSIS — K828 Other specified diseases of gallbladder: Secondary | ICD-10-CM

## 2018-12-01 DIAGNOSIS — R002 Palpitations: Secondary | ICD-10-CM

## 2018-12-01 DIAGNOSIS — R7989 Other specified abnormal findings of blood chemistry: Secondary | ICD-10-CM

## 2018-12-01 LAB — GLUCOSE, CAPILLARY
Glucose-Capillary: 84 mg/dL (ref 70–99)
Glucose-Capillary: 82 mg/dL (ref 70–99)

## 2018-12-01 LAB — CBC
HCT: 26.8 % — ABNORMAL LOW (ref 36.0–46.0)
Hemoglobin: 9 g/dL — ABNORMAL LOW (ref 12.0–15.0)
MCH: 31.3 pg (ref 26.0–34.0)
MCHC: 33.6 g/dL (ref 30.0–36.0)
MCV: 93.1 fL (ref 80.0–100.0)
Platelets: 231 10*3/uL (ref 150–400)
RBC: 2.88 MIL/uL — ABNORMAL LOW (ref 3.87–5.11)
RDW: 15.9 % — ABNORMAL HIGH (ref 11.5–15.5)
WBC: 6.4 10*3/uL (ref 4.0–10.5)
nRBC: 0 % (ref 0.0–0.2)

## 2018-12-01 LAB — COMPREHENSIVE METABOLIC PANEL
ALT: 215 U/L — ABNORMAL HIGH (ref 0–44)
AST: 249 U/L — ABNORMAL HIGH (ref 15–41)
Albumin: 2.6 g/dL — ABNORMAL LOW (ref 3.5–5.0)
Alkaline Phosphatase: 136 U/L — ABNORMAL HIGH (ref 38–126)
Anion gap: 5 (ref 5–15)
BUN: <5 mg/dL — ABNORMAL LOW (ref 8–23)
CO2: 21 mmol/L — ABNORMAL LOW (ref 22–32)
Calcium: 8 mg/dL — ABNORMAL LOW (ref 8.9–10.3)
Chloride: 113 mmol/L — ABNORMAL HIGH (ref 98–111)
Creatinine, Ser: 0.97 mg/dL (ref 0.44–1.00)
GFR calc Af Amer: >60 mL/min (ref >60)
GFR calc non Af Amer: 59 mL/min — ABNORMAL LOW (ref >60)
Glucose, Bld: 91 mg/dL (ref 70–99)
Potassium: 3.7 mmol/L (ref 3.5–5.1)
Sodium: 139 mmol/L (ref 135–145)
Total Bilirubin: 1.6 mg/dL — ABNORMAL HIGH (ref 0.3–1.2)
Total Protein: 5.3 g/dL — ABNORMAL LOW (ref 6.5–8.1)

## 2018-12-01 MED ORDER — TEMAZEPAM 7.5 MG PO CAPS
15.0000 mg | ORAL_CAPSULE | Freq: Once | ORAL | Status: AC
  Administered 2018-12-01: 15 mg via ORAL
  Filled 2018-12-01: qty 2

## 2018-12-01 MED ORDER — SODIUM CHLORIDE 0.9 % IV SOLN
2.0000 g | INTRAVENOUS | Status: AC
  Administered 2018-12-02: 2 g via INTRAVENOUS
  Filled 2018-12-01: qty 2
  Filled 2018-12-01: qty 20

## 2018-12-01 NOTE — Progress Notes (Signed)
1 Day Post-Op    CC: Black tarry stools  Subjective: She wants to eat a regular diet.  No pain this AM.    Objective: Vital signs in last 24 hours: Temp:  [97.9 F (36.6 C)-99.4 F (37.4 C)] 98.6 F (37 C) (06/17 0818) Pulse Rate:  [70-87] 70 (06/17 0818) Resp:  [13-21] 18 (06/17 0818) BP: (129-153)/(80-97) 148/89 (06/17 0818) SpO2:  [98 %-100 %] 100 % (06/17 0818) Weight:  [54.5 kg-54.6 kg] 54.5 kg (06/17 0610) Last BM Date: 11/30/18  N.p.o. 500 IV recorded 200 urine recorded Afebrile vital signs are stable LFTs are improving as noted below. WBC 6.4. EGD and colonoscopy performed yesterday did not any significant pathology. MRCP shows multiple gallstones in the lumen of the gallbladder extending to the neck with moderate amount of pericholecystic fluid, EF 0% HIDA Intake/Output from previous day: 06/16 0701 - 06/17 0700 In: 500 [I.V.:500] Out: 200 [Urine:200] Intake/Output this shift: No intake/output data recorded.  General appearance: alert, cooperative and no distress Resp: clear to auscultation bilaterally GI: No pain except some discomfort with palpation on the left side.  Lab Results:  Recent Labs    11/30/18 0456 12/01/18 0415  WBC 10.7* 6.4  HGB 11.1* 9.0*  HCT 33.9* 26.8*  PLT 263 231    BMET Recent Labs    11/30/18 0456 12/01/18 0415  NA 142 139  K 3.6 3.7  CL 108 113*  CO2 25 21*  GLUCOSE 96 91  BUN 5* <5*  CREATININE 0.89 0.97  CALCIUM 8.5* 8.0*   PT/INR Recent Labs    11/29/18 0235  LABPROT 13.7  INR 1.1    Recent Labs  Lab 11/28/18 1545 11/29/18 0913 11/30/18 0456 12/01/18 0415  AST 30 161* 825* 249*  ALT 16 64* 312* 215*  ALKPHOS 55 98 160* 136*  BILITOT 1.4* 1.3* 4.4* 1.6*  PROT 8.1 6.2* 6.2* 5.3*  ALBUMIN 4.3 3.2* 3.2* 2.6*     Lipase  No results found for: LIPASE   Medications: . estrogen (conjugated)-medroxyprogesterone  1 tablet Oral QHS  . feeding supplement (ENSURE ENLIVE)  237 mL Oral BID BM  .  pantoprazole  40 mg Oral BID  . polyethylene glycol-electrolytes  2,000 mL Oral Once  . sodium chloride flush  3 mL Intravenous Q12H    Assessment/Plan Acute kidney injury Syncope/mild troponin elevation/QTc prolongation - 17 beats SVT 11/30/18 Hypokalemia - being replaced Hx hypertension Suppressed TSH Hx gastric ulcers/GERD Strong family hx of colon cancer Hx anxiety/depression/insomnia Remote hx of tobacco use COVID - neg 07/2018 & 11/28/18   Dark stools/possible rectal bleeding Cholelithiasis/gallbladder wall thickening/abnormal gallbladder function Abnormal LFTs:   T bil 1.3(6/14) >> 1.3(6/15) >> 4.4(6/16) >>1.6                               AST: 30 (6/14)>> 161(6/15) >> 825(6/16)>>249                                ALT:  16(6/14) >>64(6/15) >>312(6/16)>>215                                Alk Phos:  55(6/14) >> 98 6/15) >>160(6/16)>>136 FEN: IV fluids/clear liquids/n.p.o. after midnight ID: None: DVT: SCDs. Follow-up: TBD POC: Andreason,[name] Spouse [phone]  [phone]  [place] Sister   [phone]  PLan:  I will start setting her up for laparoscopic cholecystectomy tomorrow.  Dr. Ninfa Linden will make the final decision.       LOS: 2 days    Tatum Corl 12/01/2018 [phone]

## 2018-12-01 NOTE — Progress Notes (Signed)
PROGRESS NOTE    Brandi [name]  DGL:[id number] DOB: 1946/04/16 DOA: 11/28/2018 PCP: Vincente Liberty, MD   Brief Narrative:  73 y.o. female with medical history significant of hypertension, GERD, depression, anxiety, GI bleeding, gastric ulcer, insomnia, who presents with dark stool, and syncope. Patient having intermittent dark tarry stool for more than 2 weeks small amount of bright red blood on 6/14.  Has lost about 10 pounds recently has had poor appetite decreased intake.  In the ER she had work-up along with CT scan and on return from CT scan appeared to have "syncopal episode" was found to be "not responsive" by technician.  in ER, significantly hypokalemic, hemoglobin 12.5 (10.7 on 04/18/2016), negative FOBT,sent COVID-19 test, heart rate 43- 101, oxygen saturation 100% on room air, blood pressure 163/70 GI was consulted regarding possible GI bleed.  Since imaging studies showed gallbladder wall thickening and ultrasound showed possible cholecystitis, HIDA scan was obtained and general surgery was consulted given normal HIDA scan with gallbladder EF 0%. 6/16 -EGD/COLO  12/01/2018: Patient seen.  Results of the HIDA scan seen.  Plan is to proceed with lap cholecystectomy.  Surgical input is appreciated.  Otherwise, no significant complaints.  Subjective: No fever or chills No shortness of breath No abdominal pain.  Assessment & Plan:   Syncope after returning from CT scan in the ER: Unclear etiology could be from electrolyte imbalance/?  Arrhythmia.  EKG has prolonged QTC.  Follow-up 2D echocardiogram.  Check orthostatic vital signs, continue IV fluids. Patient has pacer pads in place.  Cardiology consulted  Dark stools more than 2 weeks at home, suspect GI bleeding. But FOBT negative in ER. Currently no bowel movement.hb  On admission 12.5 (10.7 on 04/18/2016), and hemoglobin overall stable.  For EGD and colonoscopy today.  PPI Recent Labs  Lab 11/29/18 0235 11/29/18 0913 11/29/18  1500 11/30/18 0456 12/01/18 0415  HGB 10.9* 10.7* 10.3* 11.1* 9.0*  HCT 32.3* 32.5* 30.6* 33.9* 26.8*   Hypokalemia, unknown etiology 2.8, replaced aggressively and has improved.she reports she has had similar hypokalemia in the past and was taking supplementation but has not needed recently.  Repeat this afternoon.  Acute kidney injury with creatinine 1.3,improved with hydration.  QT prolongation at 538 :Monitor electrolytes, repleted potassium aggressively. Repeat EKG in am.Mag is stable.  Cholelithiasis with thickened gallbladder wall ultrasound reported as acute cholecystitis.  Unclear etiology.patient does not complain of significant abdominal pain.  Noted elevated LFTs and HIDA scan taht came back with abnormal ejection fraction of 0% otherwise able to visualize liver, mediastinum, gallbladder and bowel. Discussed w GI and consulted Gen surgery, following. MRCP ordered.  Negative for CBD stone surgery is planning for lap chole and IOC pending cardiology clearance 12/01/2018: HIDA scan revealed likely a cholecystitis with cholelithiasis.  Surgical team is already on board.  For possible cholecystectomy.  Transaminitis: LFTs uptrending tb/ast/alt/alp  1.3/161/64/98.> 4.4/824/312/160.CBD 6.3 mm ultrasound.  Appreciate GI input obtaining MRCP.  Elevated troponin 0.1-->0.05, also with chest pain. Likely demand mismatch. Cardiology has been consulted for further recommendation.  Aspirin has not been started due to dark stool/GI bleed concern.   Recent fall and pain in the right hip, obtained hip x-ray and appears stable.She reports she has had hip surgeries and is followed by her orthopedic surgery.  Pain control  GERD: PPI  Essential hypertension, benign: Blood pressure is controlled.  Insomnia/Anxiety: On as needed Valium at home.  Hold home Restoril due to prolonged QTC.  Suppressed TSH at 0.28 , normal free  T4 T3  DVT prophylaxis: SCD Code Status: full code Family Communication:  plan of care discussed with the patient Disposition Plan: Remains inpatient pending clinical improvement.   Consultants:  Cardiology  Procedures:  EGD  Normal esophagus and Z line Small hiatal hernia Antral gastritis, biopsies taken for H pylori. Unremarkable duodenum  Colonsocopy  Hepatic flexure and descending colon polyps- removed Internal hemorrhoids.  CT abdomen/pelvis: 1.Diffuse gallbladder wall thickening, maximum of 9 mm. The etiology of this is unclear. Recommend further assessment with limited right upper quadrant ultrasound. 2. Mild haziness along the wall of the lower esophagus at the gastroesophageal junction. This may reflect inflammation. Consider follow-up upper endoscopy for further assessment. 3. No evidence of a bowel mass, obstruction or inflammation. No other findings to account for GI bleeding.  RUQ [place] Multiple tiny gallstones noted. Gallbladder sludge noted. Gallbladder wall is thickened at 14.1 mm suggesting cholecystitis. No biliary distention  PAST 2016 ENDOSCOPIC  small hiatal hernia with no source of upper GI tract bleeding noted. RECOMMENDATIONS: monitor stools and hemoglobin and consider capsule endoscopy if continues to have melenic stools associated with drop in hemoglobin.  Antimicrobials: Anti-infectives (From admission, onward)   None       Objective: Vitals:   12/01/18 0116 12/01/18 0434 12/01/18 0610 12/01/18 0818  BP:  (!) 145/87  (!) 148/89  Pulse:  72  70  Resp:  17  18  Temp:  98.7 F (37.1 C)  98.6 F (37 C)  TempSrc:  Oral  Oral  SpO2: 100% 100%  100%  Weight:  54.6 kg 54.5 kg   Height:        Intake/Output Summary (Last 24 hours) at 12/01/2018 0956 Last data filed at 12/01/2018 0500 Gross per 24 hour  Intake 500 ml  Output 200 ml  Net 300 ml   Filed Weights   11/30/18 0558 12/01/18 0434 12/01/18 0610  Weight: 56.7 kg 54.6 kg 54.5 kg   Weight change: -2.087 kg  Body mass index is 20.63 kg/m.  Intake/Output  from previous day: 06/16 0701 - 06/17 0700 In: 500 [I.V.:500] Out: 200 [Urine:200] Intake/Output this shift: No intake/output data recorded.  Examination:  General exam: Appears calm and comfortable,Not in distress HEENT:PERRL,Oral mucosa moist, Ear/Nose normal on gross exam Respiratory system: Bilateral equal air entry, normal vesicular breath sounds, no wheezes or crackles  Cardiovascular system: S1 & S2 heard,No JVD, murmurs. Gastrointestinal system: Abdomen is  soft, tender in epigastric and right abdomen with deep palpationbut  no Murphy sign, non distended, BS +  Nervous System:Alert and oriented. No focal neurological deficits/moving extremities, sensation intact. Extremities: No edema, no clubbing, distal peripheral pulses palpable. Skin: No rashes, lesions, no icterus MSK: Normal muscle bulk,tone ,power  Medications:  Scheduled Meds: . estrogen (conjugated)-medroxyprogesterone  1 tablet Oral QHS  . feeding supplement (ENSURE ENLIVE)  237 mL Oral BID BM  . pantoprazole  40 mg Oral BID  . polyethylene glycol-electrolytes  2,000 mL Oral Once  . sodium chloride flush  3 mL Intravenous Q12H   Continuous Infusions: . sodium chloride 100 mL/hr at 11/30/18 2209    Data Reviewed: I have personally reviewed following labs and imaging studies  CBC: Recent Labs  Lab 11/29/18 0235 11/29/18 0913 11/29/18 1500 11/30/18 0456 12/01/18 0415  WBC 10.0 7.8 7.5 10.7* 6.4  HGB 10.9* 10.7* 10.3* 11.1* 9.0*  HCT 32.3* 32.5* 30.6* 33.9* 26.8*  MCV 90.5 92.6 93.0 93.6 93.1  PLT 310 275 290 263 419   Basic Metabolic Panel:  Recent Labs  Lab 11/28/18 1545  11/29/18 0424 11/29/18 0913 11/29/18 1500 11/29/18 2107 11/30/18 0456 12/01/18 0415  NA 138  --  141  --  142 141 142 139  K 2.6*   < > 2.8*  --  3.3* 3.4* 3.6 3.7  CL 91*  --  104  --  107 108 108 113*  CO2 33*  --  26  --  27 24 25  21*  GLUCOSE 110*  --  89  --  107* 103* 96 91  BUN 9  --  5*  --  <5* 5* 5* <5*   CREATININE 1.31*  --  1.08*  --  1.04* 0.95 0.89 0.97  CALCIUM 10.1  --  8.3*  --  8.6* 8.4* 8.5* 8.0*  MG 2.0  --   --  2.2  --   --   --   --    < > = values in this interval not displayed.   GFR: Estimated Creatinine Clearance: 45.8 mL/min (by C-G formula based on SCr of 0.97 mg/dL). Liver Function Tests: Recent Labs  Lab 11/28/18 1545 11/29/18 0913 11/30/18 0456 12/01/18 0415  AST 30 161* 825* 249*  ALT 16 64* 312* 215*  ALKPHOS 55 98 160* 136*  BILITOT 1.4* 1.3* 4.4* 1.6*  PROT 8.1 6.2* 6.2* 5.3*  ALBUMIN 4.3 3.2* 3.2* 2.6*   No results for input(s): LIPASE, AMYLASE in the last 168 hours. No results for input(s): AMMONIA in the last 168 hours. Coagulation Profile: Recent Labs  Lab 11/29/18 0235  INR 1.1   Cardiac Enzymes: Recent Labs  Lab 11/29/18 0235 11/29/18 0913  TROPONINI 0.10* 0.05*   BNP (last 3 results) No results for input(s): PROBNP in the last 8760 hours. HbA1C: Recent Labs    11/29/18 0235  HGBA1C 5.2   CBG: Recent Labs  Lab 11/28/18 1912 11/30/18 0806 12/01/18 0446 12/01/18 0740  GLUCAP 125* 99 84 82   Lipid Profile: Recent Labs    11/29/18 0235  CHOL 207*  HDL 80  LDLCALC 113*  TRIG 71  CHOLHDL 2.6   Thyroid Function Tests: Recent Labs    11/29/18 0235 11/29/18 1500  TSH 0.285*  --   FREET4  --  0.99  T3FREE  --  2.5   Anemia Panel: No results for input(s): VITAMINB12, FOLATE, FERRITIN, TIBC, IRON, RETICCTPCT in the last 72 hours. Sepsis Labs: No results for input(s): PROCALCITON, LATICACIDVEN in the last 168 hours.  Recent Results (from the past 240 hour(s))  Novel Coronavirus,NAA,(SEND-OUT TO REF LAB - TAT 24-48 hrs); Hosp Order     Status: None   Collection Time: 11/28/18  8:05 PM   Specimen: Nasopharyngeal Swab; Respiratory  Result Value Ref Range Status   SARS-CoV-2, NAA NOT DETECTED NOT DETECTED Final    Comment: (NOTE) This test was developed and its performance characteristics determined by [employer]. This test has not been FDA cleared or approved. This test has been authorized by FDA under an Emergency Use Authorization (EUA). This test is only authorized for the duration of time the declaration that circumstances exist justifying the authorization of the emergency use of in vitro diagnostic tests for detection of SARS-CoV-2 virus and/or diagnosis of COVID-19 infection under section 564(b)(1) of the Act, 21 U.S.C. 626RSW-5(I)(6), unless the authorization is terminated or revoked sooner. When diagnostic testing is negative, the possibility of a false negative result should be considered in the context of a patient's recent exposures and the presence of clinical signs  and symptoms consistent with COVID-19. An individual without symptoms of COVID-19 and who is not shedding SARS-CoV-2 virus would expect to have a negative (not detected) result in this assay. Performed  At: [facility] [address] [place], [place] [id number] Rush Farmer MD OZ:[id number]    [place]  Final    Comment: Performed at [facility], [place] [address] [place], [place] [address]      Radiology Studies: Mr Abdomen Mrcp Wo Contrast  Result Date: 12/01/2018 CLINICAL DATA:  Dark stool and syncope.  Abnormal liver function. EXAM: MRI ABDOMEN WITHOUT CONTRAST  (INCLUDING MRCP) TECHNIQUE: Multiplanar multisequence MR imaging of the abdomen was performed. Heavily T2-weighted images of the biliary and pancreatic ducts were obtained, and three-dimensional MRCP images were rendered by post processing. COMPARISON:  HIDA scan 11/29/2018, ultrasound 11/29/2018, CT 11/28/2018 FINDINGS: Lower chest:  Lung bases are clear. Hepatobiliary: No intrahepatic biliary duct dilatation. There multiple small gallstones within lumen gallbladder. Several small gallstones within the neck of the gallbladder (image 4/15). Small to moderate amount pericholecystic fluid. No gallbladder wall  thickening. Common bile duct is normal caliber. No filling defect within the common bile duct. Small amount fluid along the margin of the RIGHT hepatic lobe. No hepatic steatosis. Pancreas: Normal pancreatic parenchymal intensity. No ductal dilatation or inflammation. Spleen: Normal spleen. Adrenals/urinary tract: Adrenal glands and kidneys are normal. Stomach/Bowel: Stomach and limited of the small bowel is unremarkable Vascular/Lymphatic: Abdominal aortic normal caliber. No retroperitoneal periportal lymphadenopathy. Musculoskeletal: No aggressive osseous lesion IMPRESSION: 1. Multiple gallstones in lumen gallbladder which extend in the neck of the gallbladder. Moderate amount of pericholecystic fluid. Patent cystic duct but poor gallbladder function on HIDA scan. Findings suggests chronic cholecystitis or potentially intermittent obstruction of the cystic duct (transient acute cholecystitis). 2. No choledocholithiasis. Electronically Signed   By: Suzy Bouchard M.D.   On: 12/01/2018 07:51   Mr 3d Recon At Scanner  Result Date: 12/01/2018 CLINICAL DATA:  Dark stool and syncope.  Abnormal liver function. EXAM: MRI ABDOMEN WITHOUT CONTRAST  (INCLUDING MRCP) TECHNIQUE: Multiplanar multisequence MR imaging of the abdomen was performed. Heavily T2-weighted images of the biliary and pancreatic ducts were obtained, and three-dimensional MRCP images were rendered by post processing. COMPARISON:  HIDA scan 11/29/2018, ultrasound 11/29/2018, CT 11/28/2018 FINDINGS: Lower chest:  Lung bases are clear. Hepatobiliary: No intrahepatic biliary duct dilatation. There multiple small gallstones within lumen gallbladder. Several small gallstones within the neck of the gallbladder (image 4/15). Small to moderate amount pericholecystic fluid. No gallbladder wall thickening. Common bile duct is normal caliber. No filling defect within the common bile duct. Small amount fluid along the margin of the RIGHT hepatic lobe. No hepatic  steatosis. Pancreas: Normal pancreatic parenchymal intensity. No ductal dilatation or inflammation. Spleen: Normal spleen. Adrenals/urinary tract: Adrenal glands and kidneys are normal. Stomach/Bowel: Stomach and limited of the small bowel is unremarkable Vascular/Lymphatic: Abdominal aortic normal caliber. No retroperitoneal periportal lymphadenopathy. Musculoskeletal: No aggressive osseous lesion IMPRESSION: 1. Multiple gallstones in lumen gallbladder which extend in the neck of the gallbladder. Moderate amount of pericholecystic fluid. Patent cystic duct but poor gallbladder function on HIDA scan. Findings suggests chronic cholecystitis or potentially intermittent obstruction of the cystic duct (transient acute cholecystitis). 2. No choledocholithiasis. Electronically Signed   By: Suzy Bouchard M.D.   On: 12/01/2018 07:51   Nm Hepato W/eject Fract  Result Date: 11/29/2018 CLINICAL DATA:  Loss of appetite.  Weight loss. EXAM: NUCLEAR MEDICINE HEPATOBILIARY IMAGING WITH GALLBLADDER EF TECHNIQUE: Sequential images  of the abdomen were obtained out to 60 minutes following intravenous administration of radiopharmaceutical. After oral ingestion of Ensure, gallbladder ejection fraction was determined. At 60 min, normal ejection fraction is greater than 33%. RADIOPHARMACEUTICALS:  5.1 mCi Tc-25m  Choletec IV COMPARISON:  Ultrasound 11/29/2018. FINDINGS: Prompt uptake and biliary excretion of activity by the liver is seen. Gallbladder activity is visualized, consistent with patency of cystic duct. Biliary activity passes into small bowel, consistent with patent common bile duct. Calculated gallbladder ejection fraction is 0%. (Normal gallbladder ejection fraction with Ensure is greater than 33%.) IMPRESSION: 1.  Abnormal gallbladder ejection fraction at 0%. 2. Liver, biliary system, gallbladder, and bowel visualize normally. Electronically Signed   By: Marcello Moores  Register   On: 11/29/2018 14:39      LOS: 2 days    Time spent: More than 50% of that time was spent in counseling and/or coordination of care.  Bonnell Public, MD [facility]  12/01/2018, 9:56 AM

## 2018-12-01 NOTE — Progress Notes (Addendum)
Subjective: Patient states that she was given Maalox for acid reflux which helped her symptoms as she complains of epigastric and lower sternal discomfort.  Objective: Vital signs in last 24 hours: Temp:  [97.9 F (36.6 C)-99.4 F (37.4 C)] 98.6 F (37 C) (06/17 0818) Pulse Rate:  [70-87] 70 (06/17 0818) Resp:  [13-21] 18 (06/17 0818) BP: (129-153)/(80-97) 148/89 (06/17 0818) SpO2:  [98 %-100 %] 100 % (06/17 0818) Weight:  [54.5 kg-54.6 kg] 54.5 kg (06/17 0610) Weight change: -2.087 kg Last BM Date: 11/30/18  PE: Mild pallor, mild icterus GENERAL: Not in distress ABDOMEN: Soft, nondistended, nontender EXTREMITIES: No deformity  Lab Results: Results for orders placed or performed during the hospital encounter of 11/28/18 (from the past 48 hour(s))  CBC     Status: Abnormal   Collection Time: 11/29/18  3:00 PM  Result Value Ref Range   WBC 7.5 4.0 - 10.5 K/uL   RBC 3.29 (L) 3.87 - 5.11 MIL/uL   Hemoglobin 10.3 (L) 12.0 - 15.0 g/dL   HCT 30.6 (L) 36.0 - 46.0 %   MCV 93.0 80.0 - 100.0 fL   MCH 31.3 26.0 - 34.0 pg   MCHC 33.7 30.0 - 36.0 g/dL   RDW 15.3 11.5 - 15.5 %   Platelets 290 150 - 400 K/uL   nRBC 0.0 0.0 - 0.2 %    Comment: Performed at [facility], [place] [address], [place], [place] [address]  Basic metabolic panel     Status: Abnormal   Collection Time: 11/29/18  3:00 PM  Result Value Ref Range   Sodium 142 135 - 145 mmol/L   Potassium 3.3 (L) 3.5 - 5.1 mmol/L   Chloride 107 98 - 111 mmol/L   CO2 27 22 - 32 mmol/L   Glucose, Bld 107 (H) 70 - 99 mg/dL   BUN <5 (L) 8 - 23 mg/dL   Creatinine, Ser 1.04 (H) 0.44 - 1.00 mg/dL   Calcium 8.6 (L) 8.9 - 10.3 mg/dL   GFR calc non Af Amer 54 (L) >60 mL/min   GFR calc Af Amer >60 >60 mL/min   Anion gap 8 5 - 15    Comment: Performed at [facility] [address] [place], [place] [address]  T4, free     Status: None   Collection Time: 11/29/18  3:00 PM  Result Value Ref Range   Free T4 0.99 0.61 - 1.12  ng/dL    Comment: Please note change in reference range. (NOTE) Biotin ingestion may interfere with free T4 tests. If the results are inconsistent with the TSH level, previous test results, or the clinical presentation, then consider biotin interference. If needed, order repeat testing after stopping biotin. Performed at [facility], [place] [address], [place], [place] [address]   T3, free     Status: None   Collection Time: 11/29/18  3:00 PM  Result Value Ref Range   T3, Free 2.5 2.0 - 4.4 pg/mL    Comment: (NOTE) Performed At: [facility] [place], [place] [id number] Rush Farmer MD NO:[id number]   Basic metabolic panel     Status: Abnormal   Collection Time: 11/29/18  9:07 PM  Result Value Ref Range   Sodium 141 135 - 145 mmol/L   Potassium 3.4 (L) 3.5 - 5.1 mmol/L   Chloride 108 98 - 111 mmol/L   CO2 24 22 - 32 mmol/L   Glucose, Bld 103 (H) 70 - 99 mg/dL  BUN 5 (L) 8 - 23 mg/dL   Creatinine, Ser 0.95 0.44 - 1.00 mg/dL   Calcium 8.4 (L) 8.9 - 10.3 mg/dL   GFR calc non Af Amer >60 >60 mL/min   GFR calc Af Amer >60 >60 mL/min   Anion gap 9 5 - 15    Comment: Performed at [facility] [address] [place], [place] [address]  Comprehensive metabolic panel     Status: Abnormal   Collection Time: 11/30/18  4:56 AM  Result Value Ref Range   Sodium 142 135 - 145 mmol/L   Potassium 3.6 3.5 - 5.1 mmol/L   Chloride 108 98 - 111 mmol/L   CO2 25 22 - 32 mmol/L   Glucose, Bld 96 70 - 99 mg/dL   BUN 5 (L) 8 - 23 mg/dL   Creatinine, Ser 0.89 0.44 - 1.00 mg/dL   Calcium 8.5 (L) 8.9 - 10.3 mg/dL   Total Protein 6.2 (L) 6.5 - 8.1 g/dL   Albumin 3.2 (L) 3.5 - 5.0 g/dL   AST 825 (H) 15 - 41 U/L   ALT 312 (H) 0 - 44 U/L   Alkaline Phosphatase 160 (H) 38 - 126 U/L   Total Bilirubin 4.4 (H) 0.3 - 1.2 mg/dL   GFR calc non Af Amer >60 >60 mL/min   GFR calc Af Amer >60 >60 mL/min   Anion gap 9 5 - 15    Comment: Performed at [facility], [place] [address], [place], [place] [address]  CBC     Status: Abnormal   Collection Time: 11/30/18  4:56 AM  Result Value Ref Range   WBC 10.7 (H) 4.0 - 10.5 K/uL   RBC 3.62 (L) 3.87 - 5.11 MIL/uL   Hemoglobin 11.1 (L) 12.0 - 15.0 g/dL   HCT 33.9 (L) 36.0 - 46.0 %   MCV 93.6 80.0 - 100.0 fL   MCH 30.7 26.0 - 34.0 pg   MCHC 32.7 30.0 - 36.0 g/dL   RDW 15.7 (H) 11.5 - 15.5 %   Platelets 263 150 - 400 K/uL   nRBC 0.0 0.0 - 0.2 %    Comment: Performed at [facility] [address] [place], [place] [address]  Glucose, capillary     Status: None   Collection Time: 11/30/18  8:06 AM  Result Value Ref Range   Glucose-Capillary 99 70 - 99 mg/dL   Comment 1 Notify RN    Comment 2 Document in Chart   Comprehensive metabolic panel     Status: Abnormal   Collection Time: 12/01/18  4:15 AM  Result Value Ref Range   Sodium 139 135 - 145 mmol/L   Potassium 3.7 3.5 - 5.1 mmol/L   Chloride 113 (H) 98 - 111 mmol/L   CO2 21 (L) 22 - 32 mmol/L   Glucose, Bld 91 70 - 99 mg/dL   BUN <5 (L) 8 - 23 mg/dL   Creatinine, Ser 0.97 0.44 - 1.00 mg/dL   Calcium 8.0 (L) 8.9 - 10.3 mg/dL   Total Protein 5.3 (L) 6.5 - 8.1 g/dL   Albumin 2.6 (L) 3.5 - 5.0 g/dL   AST 249 (H) 15 - 41 U/L   ALT 215 (H) 0 - 44 U/L   Alkaline Phosphatase 136 (H) 38 - 126 U/L   Total Bilirubin 1.6 (H) 0.3 - 1.2 mg/dL   GFR calc non Af Amer 59 (L) >60 mL/min   GFR calc Af Amer >60 >60 mL/min  Anion gap 5 5 - 15    Comment: Performed at [facility] [address] [place], [place] [address]  CBC     Status: Abnormal   Collection Time: 12/01/18  4:15 AM  Result Value Ref Range   WBC 6.4 4.0 - 10.5 K/uL   RBC 2.88 (L) 3.87 - 5.11 MIL/uL   Hemoglobin 9.0 (L) 12.0 - 15.0 g/dL   HCT 26.8 (L) 36.0 - 46.0 %   MCV 93.1 80.0 - 100.0 fL   MCH 31.3 26.0 - 34.0 pg   MCHC 33.6 30.0 - 36.0 g/dL   RDW 15.9 (H) 11.5 - 15.5 %   Platelets 231 150 - 400 K/uL   nRBC 0.0 0.0 - 0.2 %    Comment: Performed at [facility], [place] [address] [place], [place] [address]  Glucose, capillary     Status: None   Collection Time: 12/01/18  4:46 AM  Result Value Ref Range   Glucose-Capillary 84 70 - 99 mg/dL  Glucose, capillary     Status: None   Collection Time: 12/01/18  7:40 AM  Result Value Ref Range   Glucose-Capillary 82 70 - 99 mg/dL   Comment 1 Notify RN    Comment 2 Document in Chart     Studies/Results: Mr Abdomen Mrcp Wo Contrast  Result Date: 12/01/2018 CLINICAL DATA:  Dark stool and syncope.  Abnormal liver function. EXAM: MRI ABDOMEN WITHOUT CONTRAST  (INCLUDING MRCP) TECHNIQUE: Multiplanar multisequence MR imaging of the abdomen was performed. Heavily T2-weighted images of the biliary and pancreatic ducts were obtained, and three-dimensional MRCP images were rendered by post processing. COMPARISON:  HIDA scan 11/29/2018, ultrasound 11/29/2018, CT 11/28/2018 FINDINGS: Lower chest:  Lung bases are clear. Hepatobiliary: No intrahepatic biliary duct dilatation. There multiple small gallstones within lumen gallbladder. Several small gallstones within the neck of the gallbladder (image 4/15). Small to moderate amount pericholecystic fluid. No gallbladder wall thickening. Common bile duct is normal caliber. No filling defect within the common bile duct. Small amount fluid along the margin of the RIGHT hepatic lobe. No hepatic steatosis. Pancreas: Normal pancreatic parenchymal intensity. No ductal dilatation or inflammation. Spleen: Normal spleen. Adrenals/urinary tract: Adrenal glands and kidneys are normal. Stomach/Bowel: Stomach and limited of the small bowel is unremarkable Vascular/Lymphatic: Abdominal aortic normal caliber. No retroperitoneal periportal lymphadenopathy. Musculoskeletal: No aggressive osseous lesion IMPRESSION: 1. Multiple gallstones in lumen gallbladder which extend in the neck of the gallbladder. Moderate amount of pericholecystic fluid. Patent cystic duct but poor gallbladder function on HIDA scan.  Findings suggests chronic cholecystitis or potentially intermittent obstruction of the cystic duct (transient acute cholecystitis). 2. No choledocholithiasis. Electronically Signed   By: Suzy Bouchard M.D.   On: 12/01/2018 07:51   Mr 3d Recon At Scanner  Result Date: 12/01/2018 CLINICAL DATA:  Dark stool and syncope.  Abnormal liver function. EXAM: MRI ABDOMEN WITHOUT CONTRAST  (INCLUDING MRCP) TECHNIQUE: Multiplanar multisequence MR imaging of the abdomen was performed. Heavily T2-weighted images of the biliary and pancreatic ducts were obtained, and three-dimensional MRCP images were rendered by post processing. COMPARISON:  HIDA scan 11/29/2018, ultrasound 11/29/2018, CT 11/28/2018 FINDINGS: Lower chest:  Lung bases are clear. Hepatobiliary: No intrahepatic biliary duct dilatation. There multiple small gallstones within lumen gallbladder. Several small gallstones within the neck of the gallbladder (image 4/15). Small to moderate amount pericholecystic fluid. No gallbladder wall thickening. Common bile duct is normal caliber. No filling defect within the common bile duct. Small amount fluid along the  margin of the RIGHT hepatic lobe. No hepatic steatosis. Pancreas: Normal pancreatic parenchymal intensity. No ductal dilatation or inflammation. Spleen: Normal spleen. Adrenals/urinary tract: Adrenal glands and kidneys are normal. Stomach/Bowel: Stomach and limited of the small bowel is unremarkable Vascular/Lymphatic: Abdominal aortic normal caliber. No retroperitoneal periportal lymphadenopathy. Musculoskeletal: No aggressive osseous lesion IMPRESSION: 1. Multiple gallstones in lumen gallbladder which extend in the neck of the gallbladder. Moderate amount of pericholecystic fluid. Patent cystic duct but poor gallbladder function on HIDA scan. Findings suggests chronic cholecystitis or potentially intermittent obstruction of the cystic duct (transient acute cholecystitis). 2. No choledocholithiasis.  Electronically Signed   By: Suzy Bouchard M.D.   On: 12/01/2018 07:51   Nm Hepato W/eject Fract  Result Date: 11/29/2018 CLINICAL DATA:  Loss of appetite.  Weight loss. EXAM: NUCLEAR MEDICINE HEPATOBILIARY IMAGING WITH GALLBLADDER EF TECHNIQUE: Sequential images of the abdomen were obtained out to 60 minutes following intravenous administration of radiopharmaceutical. After oral ingestion of Ensure, gallbladder ejection fraction was determined. At 60 min, normal ejection fraction is greater than 33%. RADIOPHARMACEUTICALS:  5.1 mCi Tc-26m  Choletec IV COMPARISON:  Ultrasound 11/29/2018. FINDINGS: Prompt uptake and biliary excretion of activity by the liver is seen. Gallbladder activity is visualized, consistent with patency of cystic duct. Biliary activity passes into small bowel, consistent with patent common bile duct. Calculated gallbladder ejection fraction is 0%. (Normal gallbladder ejection fraction with Ensure is greater than 33%.) IMPRESSION: 1.  Abnormal gallbladder ejection fraction at 0%. 2. Liver, biliary system, gallbladder, and bowel visualize normally. Electronically Signed   By: Marcello Moores  Register   On: 11/29/2018 14:39    Medications: I have reviewed the patient's current medications.  Assessment: Although patient complains of intermittent dark stools and severe heartburn, except a small hiatal hernia and mild antral gastritis, EGD was fairly unremarkable. For intermittent rectal bleeding, colonoscopy showed a few polyps which were removed and internal hemorrhoids.  Patient noted to have elevated liver enzymes sequently had an MRCP showed multiple gallstones in the lumen of gallbladder extending to the neck with moderate amount of pericholecystic fluid with EF of 0% on HIDA scan compatible with chronic cholecystitis or potentially intermittent obstruction of cystic duct.  No choledocholithiasis noted, liver enzymes trending down?passed CBD stone.  Plan: Patient on PPI twice daily  and Maalox for symptoms of acid reflux. Biopsies from antrum pending. It may be that patient has symptoms of indigestion possibly related to gallbladder disease. Agree with laparoscopic cholecystectomy and IOC, no further cardiology workup recommended as per their evaluation.Will f/u results of IOC.  Discussed about not going back on Zantac/ranitidine due to FDA recall, she verbalized understanding.  ADDENDUM: Antral biopsies were negative for H pylori. Two Colon polyps were removed, reported as adenomas, also has strong family history of colon cancer, repeat colonoscopy recommended in 5 years.    [name] 12/01/2018, 10:02 AM   Pager [phone] If no answer or after 5 PM call [phone]

## 2018-12-01 NOTE — Progress Notes (Signed)
Progress Note  Patient Name: Brandi [name] Date of Encounter: 12/01/2018  Primary Cardiologist: Ena Dawley, MD (New Patient)  Subjective   No significant overnight events. Patient reports continued burning chest pain that has been constant for 6 weeks now. Non-exertional. Patient has history of gastric ulcer and states pain is similar to prior pain with this. No shortness of breath. Mild abdominal pain with palpation. She reports having some lightheadedness yesterday and feeling a little unsteady on her feet.  Inpatient Medications    Scheduled Meds: . estrogen (conjugated)-medroxyprogesterone  1 tablet Oral QHS  . feeding supplement (ENSURE ENLIVE)  237 mL Oral BID BM  . pantoprazole  40 mg Oral BID  . polyethylene glycol-electrolytes  2,000 mL Oral Once  . sodium chloride flush  3 mL Intravenous Q12H   Continuous Infusions: . sodium chloride 100 mL/hr at 11/30/18 2209   PRN Meds: acetaminophen **OR** acetaminophen, alum & mag hydroxide-simeth, atropine, diazepam, hydrALAZINE, morphine injection, nitroGLYCERIN   Vital Signs    Vitals:   11/30/18 1344 12/01/18 0115 12/01/18 0116 12/01/18 0434  BP: (!) 153/83 (!) 129/93    Pulse: 76 81  72  Resp:    17  Temp: 97.9 F (36.6 C)   98.7 F (37.1 C)  TempSrc: Oral   Oral  SpO2: 99%  100% 100%  Weight:    54.6 kg  Height:        Intake/Output Summary (Last 24 hours) at 12/01/2018 0641 Last data filed at 11/30/2018 1257 Gross per 24 hour  Intake 500 ml  Output -  Net 500 ml   Filed Weights   11/28/18 2233 11/30/18 0558 12/01/18 0434  Weight: 52.7 kg 56.7 kg 54.6 kg    Telemetry    Sinus rhythm with heart rates in the 60's to low 100's. - Personally Reviewed  ECG    Normal sinus rhythm, rate 69 bpm, with minimal ST elevation in inferior leads and minimal T wave inversion in aVL. QT/QTC improving at 446/488ms today. - Personally Reviewed  Physical Exam   Physical Exam per MD:  GEN: 73 year  African-American female resting comfortably. Alert and in no acute distress.   Neck: Supple.  Cardiac: RRR. No murmurs, gallops, or rubs.  Respiratory: Clear to auscultation bilaterally. No wheezes, rhonchi, or rales. GI: Abdomen soft, non-distended, and mild tenderness in right upper quadrant and epigastric area. Bowel sounds present. Extremities: No lower extremity edema. No deformity. Radial pulses 2+ and equal bilaterally. Skin: Warm and dry. Neuro:  No focal deficits. Psych: Normal affect. Responds appropriately.  Labs    Chemistry Recent Labs  Lab 11/29/18 0913  11/29/18 2107 11/30/18 0456 12/01/18 0415  NA  --    < > 141 142 139  K  --    < > 3.4* 3.6 3.7  CL  --    < > 108 108 113*  CO2  --    < > 24 25 21*  GLUCOSE  --    < > 103* 96 91  BUN  --    < > 5* 5* <5*  CREATININE  --    < > 0.95 0.89 0.97  CALCIUM  --    < > 8.4* 8.5* 8.0*  PROT 6.2*  --   --  6.2* 5.3*  ALBUMIN 3.2*  --   --  3.2* 2.6*  AST 161*  --   --  825* 249*  ALT 64*  --   --  312* 215*  ALKPHOS  98  --   --  160* 136*  BILITOT 1.3*  --   --  4.4* 1.6*  GFRNONAA  --    < > >60 >60 59*  GFRAA  --    < > >60 >60 >60  ANIONGAP  --    < > 9 9 5    < > = values in this interval not displayed.     Hematology Recent Labs  Lab 11/29/18 1500 11/30/18 0456 12/01/18 0415  WBC 7.5 10.7* 6.4  RBC 3.29* 3.62* 2.88*  HGB 10.3* 11.1* 9.0*  HCT 30.6* 33.9* 26.8*  MCV 93.0 93.6 93.1  MCH 31.3 30.7 31.3  MCHC 33.7 32.7 33.6  RDW 15.3 15.7* 15.9*  PLT 290 263 231    Cardiac Enzymes Recent Labs  Lab 11/29/18 0235 11/29/18 0913  TROPONINI 0.10* 0.05*   No results for input(s): TROPIPOC in the last 168 hours.   BNPNo results for input(s): BNP, PROBNP in the last 168 hours.   DDimer No results for input(s): DDIMER in the last 168 hours.   Radiology    Nm Hepato W/eject Fract  Result Date: 11/29/2018 CLINICAL DATA:  Loss of appetite.  Weight loss. EXAM: NUCLEAR MEDICINE HEPATOBILIARY IMAGING  WITH GALLBLADDER EF TECHNIQUE: Sequential images of the abdomen were obtained out to 60 minutes following intravenous administration of radiopharmaceutical. After oral ingestion of Ensure, gallbladder ejection fraction was determined. At 60 min, normal ejection fraction is greater than 33%. RADIOPHARMACEUTICALS:  5.1 mCi Tc-40m  Choletec IV COMPARISON:  Ultrasound 11/29/2018. FINDINGS: Prompt uptake and biliary excretion of activity by the liver is seen. Gallbladder activity is visualized, consistent with patency of cystic duct. Biliary activity passes into small bowel, consistent with patent common bile duct. Calculated gallbladder ejection fraction is 0%. (Normal gallbladder ejection fraction with Ensure is greater than 33%.) IMPRESSION: 1.  Abnormal gallbladder ejection fraction at 0%. 2. Liver, biliary system, gallbladder, and bowel visualize normally. Electronically Signed   By: Marcello Moores  Register   On: 11/29/2018 14:39   Dg Hips Bilat With Pelvis 3-4 Views  Result Date: 11/29/2018 CLINICAL DATA:  Right greater than left hip pain after a fall 4 days ago. EXAM: DG HIP (WITH OR WITHOUT PELVIS) 3-4V BILAT COMPARISON:  CT abdomen and pelvis 11/28/2018. Right hip radiographs 09/27/2015. 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 No acute fracture or dislocation is identified. There is no lucency about the femoral stems to suggest loosening or infection. A prominent subchondral cyst superiorly in the left acetabulum is unchanged. Excreted IV contrast is noted in the bladder, and postsurgical changes are noted in the lower lumbar spine. 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 Electronically Signed   By: Logan Bores M.D.   On: 11/29/2018 09:49    Cardiac Studies   Echocardiogram 11/29/2018: Impressions:  1. The left ventricle has normal systolic function, with an ejection fraction of 60-65%. The cavity size was normal. Left ventricular  diastolic function could not be evaluated.  2. The right ventricle has normal systolc function. The cavity was normal. There is no increase in right ventricular wall thickness.  3. Mild thickening of the mitral valve leaflet.  4. Aortic valve regurgitation was not assessed by color flow Doppler.  5. Pulmonic valve regurgitation was not assessed by color flow Doppler.  Patient Profile   Ms. Mourer is a 73 y.o. female with a history of hypertension, GERD, gastric ulcer with GI bleeding, depression, anxiety, and insomnia who presented with heart burn,  dark tarry stool, and syncope. Cardiology was consulted for evaluation of elevated troponin at the request of Dr. Blaine Hamper.   Assessment & Plan    Syncope - Patient had a syncopal episode in the ED returning from CT scan where she was found to be  "not responsive." No hypotension or signs of allergic reaction to contrast. No signs of stroke like symptoms. Per ED notes, there were no arrhythmias or pauses on telemetry.  - Telemetry shows sinus rhythm with rates in the 60's to low 100's. No further episodes of non-sustained VT. - Echo showed LVEF of 60-65% with no regional wall motion abnormalities.  - Continue to monitor.  Elevated Troponin  - Troponin minimally elevated and down-trending at 0.10 >> 0.05. Likely demand ischemia. - EKG this morning shows normal sinus rhythm with minimally elevated ST elevations in inferior leads and mild T wave inversion in lead aVL which is new from other EKGs this admission. - Patient reports burning chest pain that has been constant for 6 weeks. Non-exertion. Similar to prior pain with gastric ulcer. Symptoms not consistent with cardiac chest pain. Suspect secondary to GI issues. - Echocardiogram showed normal LVEF and no regional wall motion abnormalities.  - Dr. Meda Coffee initially recommended coronary CT following EGD and colonoscopy. EGD and colonoscopy performed yesterday. Surgery now planning for possible lap  cholecystectomy and IOC. Will discuss timing of coronary CT with MD. May be able to do as outpatient.  Prolonged QT/QTc  - In setting of nausea/vomiting and severe hypokalemia. - Improving 583/632ms >> 519/629ms >> 476/540ms >> 446/473ms. - Continue to monitor. Avoid any QT prolonging medications.  Possible Melena  - Patient reports dark stools x2 weeks suspicious for GI bleeding. - Hemoglobin 9.0 today, down from 11.1 yesterday. - FOBT negative in the ED. - EGD on 6/16 showed small hiatal hernia and antra gastritis (biopsies taken for H pylori) otherwise unremarkable. - Colonoscopy on 6/16 showed polyp on hepatic flexure and descending colon which were removed as well as internal hemorrhoid.  - Management per GI.  Cholelithiasis with Transaminitis - HIDA scan showed abnormal ejection fraction of 0% otherwise normal. - LFTs slowly improving but still elevated. - MRCP on 6/16 showed multiple gallstones in lumen gallbladder which extend in the neck of the gallbladder with moderate amount of pericholecystic fluid. Patent cystic duct but poor gallbladder function on HIDA scan. Findings suggestive of chronic cholecystitis or potentially transient acute cholecystitis. - Surgery mentioned possibly proceeding with laparoscopic cholecystectomy. - Management per Surgery and GI.  Pre-Op Evaluation - Echo as above.  - Per Revised Cardiac Risk Index, consider low risk. - No additional cardiac work-up necessary prior to laparoscopic cholecystectomy.   For questions or updates, please contact [facility] Please consult [email or web address] for contact info under Cardiology/STEMI.      SignedDarreld Mclean, PA-C  12/01/2018, 6:41 AM   Pager: [phone]

## 2018-12-02 ENCOUNTER — Inpatient Hospital Stay (HOSPITAL_COMMUNITY): Payer: Medicare Other | Admitting: Certified Registered Nurse Anesthetist

## 2018-12-02 ENCOUNTER — Inpatient Hospital Stay (HOSPITAL_COMMUNITY): Payer: Medicare Other

## 2018-12-02 ENCOUNTER — Other Ambulatory Visit: Payer: Self-pay | Admitting: Student

## 2018-12-02 ENCOUNTER — Encounter (HOSPITAL_COMMUNITY)
Admission: EM | Disposition: A | Discharge status: Home / Self Care | Payer: Self-pay | Source: Home / Self Care | Attending: Internal Medicine

## 2018-12-02 DIAGNOSIS — R072 Precordial pain: Secondary | ICD-10-CM

## 2018-12-02 DIAGNOSIS — R0789 Other chest pain: Secondary | ICD-10-CM

## 2018-12-02 DIAGNOSIS — R778 Other specified abnormalities of plasma proteins: Secondary | ICD-10-CM

## 2018-12-02 HISTORY — PX: CHOLECYSTECTOMY: SHX55

## 2018-12-02 LAB — COMPREHENSIVE METABOLIC PANEL
ALT: 143 U/L — ABNORMAL HIGH (ref 0–44)
AST: 78 U/L — ABNORMAL HIGH (ref 15–41)
Albumin: 2.8 g/dL — ABNORMAL LOW (ref 3.5–5.0)
Alkaline Phosphatase: 113 U/L (ref 38–126)
Anion gap: 7 (ref 5–15)
BUN: <5 mg/dL — ABNORMAL LOW (ref 8–23)
CO2: 22 mmol/L (ref 22–32)
Calcium: 8.3 mg/dL — ABNORMAL LOW (ref 8.9–10.3)
Chloride: 112 mmol/L — ABNORMAL HIGH (ref 98–111)
Creatinine, Ser: 1.05 mg/dL — ABNORMAL HIGH (ref 0.44–1.00)
GFR calc Af Amer: >60 mL/min (ref >60)
GFR calc non Af Amer: 53 mL/min — ABNORMAL LOW (ref >60)
Glucose, Bld: 86 mg/dL (ref 70–99)
Potassium: 3.6 mmol/L (ref 3.5–5.1)
Sodium: 141 mmol/L (ref 135–145)
Total Bilirubin: 1.2 mg/dL (ref 0.3–1.2)
Total Protein: 5.6 g/dL — ABNORMAL LOW (ref 6.5–8.1)

## 2018-12-02 LAB — SURGICAL PCR SCREEN
MRSA, PCR: NEGATIVE
Staphylococcus aureus: NEGATIVE

## 2018-12-02 SURGERY — LAPAROSCOPIC CHOLECYSTECTOMY WITH INTRAOPERATIVE CHOLANGIOGRAM
Anesthesia: General | Site: Abdomen

## 2018-12-02 MED ORDER — EPHEDRINE SULFATE-NACL 50-0.9 MG/10ML-% IV SOSY
PREFILLED_SYRINGE | INTRAVENOUS | Status: DC | PRN
  Administered 2018-12-02 (×2): 15 mg via INTRAVENOUS

## 2018-12-02 MED ORDER — OXYCODONE HCL 5 MG PO TABS
5.0000 mg | ORAL_TABLET | Freq: Four times a day (QID) | ORAL | Status: DC | PRN
  Administered 2018-12-02 (×2): 5 mg via ORAL
  Filled 2018-12-02 (×2): qty 1

## 2018-12-02 MED ORDER — ACETAMINOPHEN 160 MG/5ML PO SOLN: 325.0000 mg | ORAL | Status: DC | PRN

## 2018-12-02 MED ORDER — LIDOCAINE 2% (20 MG/ML) 5 ML SYRINGE
INTRAMUSCULAR | Status: AC
  Filled 2018-12-02: qty 15

## 2018-12-02 MED ORDER — PROPOFOL 10 MG/ML IV BOLUS
INTRAVENOUS | Status: AC
  Filled 2018-12-02: qty 20

## 2018-12-02 MED ORDER — BUPIVACAINE-EPINEPHRINE (PF) 0.25% -1:200000 IJ SOLN
INTRAMUSCULAR | Status: AC
  Filled 2018-12-02: qty 30

## 2018-12-02 MED ORDER — SUGAMMADEX SODIUM 200 MG/2ML IV SOLN
INTRAVENOUS | Status: DC | PRN
  Administered 2018-12-02: 200 mg via INTRAVENOUS

## 2018-12-02 MED ORDER — FENTANYL CITRATE (PF) 100 MCG/2ML IJ SOLN
25.0000 ug | INTRAMUSCULAR | Status: DC | PRN
  Administered 2018-12-02 (×4): 25 ug via INTRAVENOUS

## 2018-12-02 MED ORDER — PROPOFOL 10 MG/ML IV BOLUS
INTRAVENOUS | Status: DC | PRN
  Administered 2018-12-02: 150 mg via INTRAVENOUS

## 2018-12-02 MED ORDER — CARVEDILOL 3.125 MG PO TABS
3.1250 mg | ORAL_TABLET | Freq: Two times a day (BID) | ORAL | Status: DC
  Administered 2018-12-02 – 2018-12-04 (×4): 3.125 mg via ORAL
  Filled 2018-12-02 (×4): qty 1

## 2018-12-02 MED ORDER — DEXAMETHASONE SODIUM PHOSPHATE 10 MG/ML IJ SOLN
INTRAMUSCULAR | Status: DC | PRN
  Administered 2018-12-02: 5 mg via INTRAVENOUS

## 2018-12-02 MED ORDER — FENTANYL CITRATE (PF) 250 MCG/5ML IJ SOLN
INTRAMUSCULAR | Status: AC
  Filled 2018-12-02: qty 5

## 2018-12-02 MED ORDER — SODIUM CHLORIDE 0.9 % IV SOLN: INTRAVENOUS | Status: DC

## 2018-12-02 MED ORDER — AMLODIPINE BESYLATE 5 MG PO TABS
5.0000 mg | ORAL_TABLET | Freq: Every day | ORAL | Status: DC
  Administered 2018-12-02 – 2018-12-04 (×3): 5 mg via ORAL
  Filled 2018-12-02 (×3): qty 1

## 2018-12-02 MED ORDER — OXYCODONE HCL 5 MG/5ML PO SOLN: 5.0000 mg | Freq: Once | ORAL | Status: DC | PRN

## 2018-12-02 MED ORDER — FENTANYL CITRATE (PF) 250 MCG/5ML IJ SOLN
INTRAMUSCULAR | Status: DC | PRN
  Administered 2018-12-02 (×7): 50 ug via INTRAVENOUS

## 2018-12-02 MED ORDER — SODIUM CHLORIDE 0.9 % IR SOLN
Status: DC | PRN
  Administered 2018-12-02: 1

## 2018-12-02 MED ORDER — ROCURONIUM BROMIDE 10 MG/ML (PF) SYRINGE
PREFILLED_SYRINGE | INTRAVENOUS | Status: AC
  Filled 2018-12-02: qty 20

## 2018-12-02 MED ORDER — LACTATED RINGERS IV SOLN
INTRAVENOUS | Status: DC
  Administered 2018-12-02: 09:00:00 via INTRAVENOUS

## 2018-12-02 MED ORDER — DEXAMETHASONE SODIUM PHOSPHATE 10 MG/ML IJ SOLN
INTRAMUSCULAR | Status: AC
  Filled 2018-12-02: qty 2

## 2018-12-02 MED ORDER — SODIUM CHLORIDE 0.9 % IV SOLN
INTRAVENOUS | Status: DC | PRN
  Administered 2018-12-02: 50 mL

## 2018-12-02 MED ORDER — FENTANYL CITRATE (PF) 100 MCG/2ML IJ SOLN
INTRAMUSCULAR | Status: AC
  Filled 2018-12-02: qty 2

## 2018-12-02 MED ORDER — ACETAMINOPHEN 10 MG/ML IV SOLN: 1000.0000 mg | Freq: Once | INTRAVENOUS | Status: DC | PRN

## 2018-12-02 MED ORDER — SUCCINYLCHOLINE CHLORIDE 200 MG/10ML IV SOSY
PREFILLED_SYRINGE | INTRAVENOUS | Status: DC | PRN
  Administered 2018-12-02: 100 mg via INTRAVENOUS

## 2018-12-02 MED ORDER — MEPERIDINE HCL 25 MG/ML IJ SOLN: 6.2500 mg | INTRAMUSCULAR | Status: DC | PRN

## 2018-12-02 MED ORDER — SUCCINYLCHOLINE CHLORIDE 200 MG/10ML IV SOSY
PREFILLED_SYRINGE | INTRAVENOUS | Status: AC
  Filled 2018-12-02: qty 30

## 2018-12-02 MED ORDER — BUPIVACAINE-EPINEPHRINE 0.25% -1:200000 IJ SOLN
INTRAMUSCULAR | Status: DC | PRN
  Administered 2018-12-02: 20 mL

## 2018-12-02 MED ORDER — OXYCODONE HCL 5 MG PO TABS: 5.0000 mg | ORAL_TABLET | Freq: Once | ORAL | Status: DC | PRN

## 2018-12-02 MED ORDER — 0.9 % SODIUM CHLORIDE (POUR BTL) OPTIME
TOPICAL | Status: DC | PRN
  Administered 2018-12-02: 1000 mL

## 2018-12-02 MED ORDER — DEXAMETHASONE SODIUM PHOSPHATE 4 MG/ML IJ SOLN: 8.0000 mg | Freq: Once | INTRAMUSCULAR | Status: DC | PRN

## 2018-12-02 MED ORDER — ONDANSETRON HCL 4 MG/2ML IJ SOLN
INTRAMUSCULAR | Status: AC
  Filled 2018-12-02: qty 4

## 2018-12-02 MED ORDER — LIDOCAINE 2% (20 MG/ML) 5 ML SYRINGE
INTRAMUSCULAR | Status: DC | PRN
  Administered 2018-12-02: 100 mg via INTRAVENOUS

## 2018-12-02 MED ORDER — ACETAMINOPHEN 325 MG PO TABS: 325.0000 mg | ORAL_TABLET | ORAL | Status: DC | PRN

## 2018-12-02 MED ORDER — MORPHINE SULFATE (PF) 2 MG/ML IV SOLN
1.0000 mg | INTRAVENOUS | Status: DC | PRN
  Administered 2018-12-03: 1 mg via INTRAVENOUS
  Filled 2018-12-02: qty 1

## 2018-12-02 MED ORDER — POTASSIUM CHLORIDE CRYS ER 20 MEQ PO TBCR
40.0000 meq | EXTENDED_RELEASE_TABLET | Freq: Once | ORAL | Status: AC
  Administered 2018-12-02: 40 meq via ORAL
  Filled 2018-12-02: qty 2

## 2018-12-02 MED ORDER — ROCURONIUM BROMIDE 10 MG/ML (PF) SYRINGE
PREFILLED_SYRINGE | INTRAVENOUS | Status: DC | PRN
  Administered 2018-12-02: 10 mg via INTRAVENOUS
  Administered 2018-12-02: 20 mg via INTRAVENOUS

## 2018-12-02 MED ORDER — ONDANSETRON HCL 4 MG/2ML IJ SOLN
INTRAMUSCULAR | Status: DC | PRN
  Administered 2018-12-02: 4 mg via INTRAVENOUS

## 2018-12-02 SURGICAL SUPPLY — 39 items
ADH SKN CLS APL DERMABOND .7 (GAUZE/BANDAGES/DRESSINGS) ×1
APPLIER CLIP 5 13 M/L LIGAMAX5 (MISCELLANEOUS) ×3
APR CLP MED LRG 5 ANG JAW (MISCELLANEOUS) ×1
BAG SPEC RTRVL LRG 6X4 10 (ENDOMECHANICALS) ×1
BLADE CLIPPER SURG (BLADE) IMPLANT
CANISTER SUCT 3000ML PPV (MISCELLANEOUS) ×3 IMPLANT
CHLORAPREP W/TINT 26ML (MISCELLANEOUS) ×3 IMPLANT
CLIP APPLIE 5 13 M/L LIGAMAX5 (MISCELLANEOUS) ×1 IMPLANT
COVER MAYO STAND STRL (DRAPES) ×2 IMPLANT
COVER SURGICAL LIGHT HANDLE (MISCELLANEOUS) ×3 IMPLANT
COVER WAND RF STERILE (DRAPES) ×3 IMPLANT
DERMABOND ADVANCED (GAUZE/BANDAGES/DRESSINGS) ×2
DERMABOND ADVANCED .7 DNX12 (GAUZE/BANDAGES/DRESSINGS) ×1 IMPLANT
DRAPE C-ARM 42X72 X-RAY (DRAPES) IMPLANT
ELECT REM PT RETURN 9FT ADLT (ELECTROSURGICAL) ×3
ELECTRODE REM PT RTRN 9FT ADLT (ELECTROSURGICAL) ×1 IMPLANT
GLOVE SURG SIGNA 7.5 PF LTX (GLOVE) ×3 IMPLANT
GOWN STRL REUS W/ TWL LRG LVL3 (GOWN DISPOSABLE) ×2 IMPLANT
GOWN STRL REUS W/ TWL XL LVL3 (GOWN DISPOSABLE) ×1 IMPLANT
GOWN STRL REUS W/TWL LRG LVL3 (GOWN DISPOSABLE) ×6
GOWN STRL REUS W/TWL XL LVL3 (GOWN DISPOSABLE) ×3
KIT BASIN OR (CUSTOM PROCEDURE TRAY) ×3 IMPLANT
KIT TURNOVER KIT B (KITS) ×3 IMPLANT
NS IRRIG 1000ML POUR BTL (IV SOLUTION) ×3 IMPLANT
PAD ARMBOARD 7.5X6 YLW CONV (MISCELLANEOUS) ×3 IMPLANT
POUCH SPECIMEN RETRIEVAL 10MM (ENDOMECHANICALS) ×3 IMPLANT
SCISSORS LAP 5X35 DISP (ENDOMECHANICALS) ×3 IMPLANT
SET CHOLANGIOGRAPH 5 50 .035 (SET/KITS/TRAYS/PACK) IMPLANT
SET IRRIG TUBING LAPAROSCOPIC (IRRIGATION / IRRIGATOR) ×3 IMPLANT
SET TUBE SMOKE EVAC HIGH FLOW (TUBING) ×3 IMPLANT
SLEEVE ENDOPATH XCEL 5M (ENDOMECHANICALS) ×8 IMPLANT
SPECIMEN JAR SMALL (MISCELLANEOUS) ×3 IMPLANT
SUT MNCRL AB 4-0 PS2 18 (SUTURE) ×3 IMPLANT
TOWEL GREEN STERILE FF (TOWEL DISPOSABLE) ×3 IMPLANT
TOWEL OR 17X26 10 PK STRL BLUE (TOWEL DISPOSABLE) ×3 IMPLANT
TRAY LAPAROSCOPIC MC (CUSTOM PROCEDURE TRAY) ×3 IMPLANT
TROCAR XCEL BLUNT TIP 100MML (ENDOMECHANICALS) ×3 IMPLANT
TROCAR XCEL NON-BLD 5MMX100MML (ENDOMECHANICALS) ×3 IMPLANT
WATER STERILE IRR 1000ML POUR (IV SOLUTION) ×3 IMPLANT

## 2018-12-02 NOTE — Transfer of Care (Signed)
Immediate Anesthesia Transfer of Care Note  Patient: Brandi [name]  Procedure(s) Performed: LAPAROSCOPIC CHOLECYSTECTOMY WITH INTRAOPERATIVE CHOLANGIOGRAM (N/A Abdomen)  Patient Location: PACU  Anesthesia Type:General  Level of Consciousness: awake, alert  and patient cooperative  Airway & Oxygen Therapy: Patient Spontanous Breathing  Post-op Assessment: Report given to RN and Post -op Vital signs reviewed and stable  Post vital signs: Reviewed and stable  Last Vitals:  Vitals Value Taken Time  BP 139/80 12/02/18 1158  Temp    Pulse 86 12/02/18 1200  Resp 13 12/02/18 1200  SpO2 99 % 12/02/18 1200  Vitals shown include unvalidated device data.  Last Pain:  Vitals:   12/02/18 0504  TempSrc: Oral  PainSc:       Patients Stated Pain Goal: 0 (11/65/79 0383)  Complications: No apparent anesthesia complications

## 2018-12-02 NOTE — Discharge Instructions (Signed)
[facility] ______[facility] LAPAROSCOPIC SURGERY: POST OP INSTRUCTIONS Always review your discharge instruction sheet given to you by the facility where your surgery was performed. IF YOU HAVE DISABILITY OR FAMILY LEAVE FORMS, YOU MUST BRING THEM TO THE OFFICE FOR PROCESSING.   DO NOT GIVE THEM TO YOUR DOCTOR.  1. A prescription for pain medication may be given to you upon discharge.  Take your pain medication as prescribed, if needed.  If narcotic pain medicine is not needed, then you may take acetaminophen (Tylenol) or ibuprofen (Advil) as needed. 2. Take your usually prescribed medications unless otherwise directed. 3. If you need a refill on your pain medication, please contact your pharmacy.  They will contact our office to request authorization. Prescriptions will not be filled after 5pm or on week-ends. 4. You should follow a light diet the first few days after arrival home, such as soup and crackers, etc.  Be sure to include lots of fluids daily. 5. Most patients will experience some swelling and bruising in the area of the incisions.  Ice packs will help.  Swelling and bruising can take several days to resolve.  6. It is common to experience some constipation if taking pain medication after surgery.  Increasing fluid intake and taking a stool softener (such as Colace) will usually help or prevent this problem from occurring.  A mild laxative (Milk of Magnesia or Miralax) should be taken according to package instructions if there are no bowel movements after 48 hours. 7. Unless discharge instructions indicate otherwise, you may remove your bandages 24-48 hours after surgery, and you may shower at that time.  You may have steri-strips (small skin tapes) in place directly over the incision.  These strips should be left on the skin for 7-10 days.  If your surgeon used skin glue on the incision, you may shower in 24 hours.  The glue will flake off over the next 2-3 weeks.  Any sutures or  staples will be removed at the office during your follow-up visit. 8. ACTIVITIES:  You may resume regular (light) daily activities beginning the next day--such as daily self-care, walking, climbing stairs--gradually increasing activities as tolerated.  You may have sexual intercourse when it is comfortable.  Refrain from any heavy lifting or straining until approved by your doctor. a. You may drive when you are no longer taking prescription pain medication, you can comfortably wear a seatbelt, and you can safely maneuver your car and apply brakes.  9. You should see your doctor in the office for a follow-up appointment approximately 2-3 weeks after your surgery.  Make sure that you call for this appointment within a day or two after you arrive home to insure a convenient appointment time.  WHEN TO CALL YOUR DOCTOR: 1. Fever over 101.0 2. Inability to urinate 3. Continued bleeding from incision. 4. Increased pain, redness, or drainage from the incision. 5. Increasing abdominal pain  The clinic staff is available to answer your questions during regular business hours.  Please dont hesitate to call and ask to speak to one of the nurses for clinical concerns.  If you have a medical emergency, go to the nearest emergency room or call 911.  A surgeon from [facility] is always on call at the hospital. [address] [place], [place], [place]  [address] ? [address], [place], [place]   [address] [phone] ? [phone] ? FAX [phone] Web site: [email or web address]     Managing Your Pain After  Surgery Without Opioids    Thank you for participating in our program to help patients manage their pain after surgery without opioids. This is part of our effort to provide you with the best care possible, without exposing you or your family to the risk that opioids pose.  What pain can I expect after surgery? You can expect to have some pain after  surgery. This is normal. The pain is typically worse the day after surgery, and quickly begins to get better. Many studies have found that many patients are able to manage their pain after surgery with Over-the-Counter (OTC) medications such as Tylenol and Motrin. If you have a condition that does not allow you to take Tylenol or Motrin, notify your surgical team.  How will I manage my pain? The best strategy for controlling your pain after surgery is around the clock pain control with Tylenol (acetaminophen) and Motrin (ibuprofen or Advil). Alternating these medications with each other allows you to maximize your pain control. In addition to Tylenol and Motrin, you can use heating pads or ice packs on your incisions to help reduce your pain.  How will I alternate your regular strength over-the-counter pain medication? You will take a dose of pain medication every three hours. ; Start by taking 650 mg of Tylenol (2 pills of 325 mg) ; 3 hours later take 600 mg of Motrin (3 pills of 200 mg) ; 3 hours after taking the Motrin take 650 mg of Tylenol ; 3 hours after that take 600 mg of Motrin.   - 1 -  See example - if your first dose of Tylenol is at 12:00 PM   12:00 PM Tylenol 650 mg (2 pills of 325 mg)  3:00 PM Motrin 600 mg (3 pills of 200 mg)  6:00 PM Tylenol 650 mg (2 pills of 325 mg)  9:00 PM Motrin 600 mg (3 pills of 200 mg)  Continue alternating every 3 hours   We recommend that you follow this schedule around-the-clock for at least 3 days after surgery, or until you feel that it is no longer needed. Use the table on the last page of this handout to keep track of the medications you are taking. Important: Do not take more than 3000mg  of Tylenol or 3200mg  of Motrin in a 24-hour period. Do not take ibuprofen/Motrin if you have a history of bleeding stomach ulcers, severe kidney disease, &/or actively taking a blood thinner  What if I still have pain? If you have pain that is not  controlled with the over-the-counter pain medications (Tylenol and Motrin or Advil) you might have what we call breakthrough pain. You will receive a prescription for a small amount of an opioid pain medication such as Oxycodone, Tramadol, or Tylenol with Codeine. Use these opioid pills in the first 24 hours after surgery if you have breakthrough pain. Do not take more than 1 pill every 4-6 hours.  If you still have uncontrolled pain after using all opioid pills, don't hesitate to call our staff using the number provided. We will help make sure you are managing your pain in the best way possible, and if necessary, we can provide a prescription for additional pain medication.   Day 1    Time  Name of Medication Number of pills taken  Amount of Acetaminophen  Pain Level   Comments  AM PM       AM PM       AM PM  AM PM       AM PM       AM PM       AM PM       AM PM       Total Daily amount of Acetaminophen Do not take more than  3,000 mg per day      Day 2    Time  Name of Medication Number of pills taken  Amount of Acetaminophen  Pain Level   Comments  AM PM       AM PM       AM PM       AM PM       AM PM       AM PM       AM PM       AM PM       Total Daily amount of Acetaminophen Do not take more than  3,000 mg per day      Day 3    Time  Name of Medication Number of pills taken  Amount of Acetaminophen  Pain Level   Comments  AM PM       AM PM       AM PM       AM PM          AM PM       AM PM       AM PM       AM PM       Total Daily amount of Acetaminophen Do not take more than  3,000 mg per day      Day 4    Time  Name of Medication Number of pills taken  Amount of Acetaminophen  Pain Level   Comments  AM PM       AM PM       AM PM       AM PM       AM PM       AM PM       AM PM       AM PM       Total Daily amount of Acetaminophen Do not take more than  3,000 mg per day      Day 5    Time  Name of Medication  Number of pills taken  Amount of Acetaminophen  Pain Level   Comments  AM PM       AM PM       AM PM       AM PM       AM PM       AM PM       AM PM       AM PM       Total Daily amount of Acetaminophen Do not take more than  3,000 mg per day       Day 6    Time  Name of Medication Number of pills taken  Amount of Acetaminophen  Pain Level  Comments  AM PM       AM PM       AM PM       AM PM       AM PM       AM PM       AM PM       AM PM       Total Daily amount of Acetaminophen Do not take more than  3,000 mg per day      Day 7    Time  Name of Medication Number of pills taken  Amount of Acetaminophen  Pain Level   Comments  AM PM       AM PM       AM PM       AM PM       AM PM       AM PM       AM PM       AM PM       Total Daily amount of Acetaminophen Do not take more than  3,000 mg per day        For additional information about how and where to safely dispose of unused opioid medications - [employer]  Disclaimer: This document contains information and/or instructional materials adapted from [place] for the typical patient with your condition. It does not replace medical advice from your health care provider because your experience may differ from that of the typical patient. Talk to your health care provider if you have any questions about this document, your condition or your treatment plan. Adapted from [place]

## 2018-12-02 NOTE — Anesthesia Preprocedure Evaluation (Signed)
Anesthesia Evaluation  Patient identified by MRN, date of birth, ID band Patient awake    Reviewed: Allergy & Precautions, NPO status , Patient's Chart, lab work & pertinent test results  Airway Mallampati: I  TM Distance: >3 FB Neck ROM: Full    Dental  (+) Dental Advisory Given, Partial Upper   Pulmonary neg pulmonary ROS,    Pulmonary exam normal breath sounds clear to auscultation       Cardiovascular hypertension, Pt. on medications Normal cardiovascular exam Rhythm:Regular Rate:Normal     Neuro/Psych PSYCHIATRIC DISORDERS Anxiety Depression negative neurological ROS     GI/Hepatic Neg liver ROS, hiatal hernia, GERD  Medicated,  Endo/Other    Renal/GU      Musculoskeletal   Abdominal Normal abdominal exam  (+)   Peds  Hematology   Anesthesia Other Findings   Reproductive/Obstetrics                             Anesthesia Physical  Anesthesia Plan  ASA: III  Anesthesia Plan: General   Post-op Pain Management:    Induction: Intravenous  PONV Risk Score and Plan: 4 or greater and Treatment may vary due to age or medical condition, Ondansetron and Dexamethasone  Airway Management Planned: Oral ETT  Additional Equipment: None  Intra-op Plan:   Post-operative Plan: Extubation in OR  Informed Consent: I have reviewed the patients History and Physical, chart, labs and discussed the procedure including the risks, benefits and alternatives for the proposed anesthesia with the patient or authorized representative who has indicated his/her understanding and acceptance.     Dental advisory given  Plan Discussed with: CRNA  Anesthesia Plan Comments:         Anesthesia Quick Evaluation

## 2018-12-02 NOTE — Progress Notes (Signed)
IOC noted- no filling defects, patent biliary system, dilated CBD. TB 1.2/AST 78/ALT 143/ALP 113. No H Pylori and tubular adenomas removed, repeat colonoscopy in 5 years. Recommend PPI once a day. GI will sign off.  [name], MD

## 2018-12-02 NOTE — Progress Notes (Signed)
Patient ID: Brandi [name], female   DOB: [birth year], 73 y.o.   MRN: [id number]   Pre Procedure note for inpatients:   Brandi [name] has been scheduled for Procedure(s): LAPAROSCOPIC CHOLECYSTECTOMY WITH INTRAOPERATIVE CHOLANGIOGRAM (N/A) today. The various methods of treatment have been discussed with the patient. After consideration of the risks, benefits and treatment options the patient has consented to the planned procedure.   The patient has been seen and labs reviewed. There are no changes in the patient's condition to prevent proceeding with the planned procedure today.  Recent labs:  Lab Results  Component Value Date   WBC 6.4 12/01/2018   HGB 9.0 (L) 12/01/2018   HCT 26.8 (L) 12/01/2018   PLT 231 12/01/2018   GLUCOSE 86 12/02/2018   CHOL 207 (H) 11/29/2018   TRIG 71 11/29/2018   HDL 80 11/29/2018   LDLCALC 113 (H) 11/29/2018   ALT 143 (H) 12/02/2018   AST 78 (H) 12/02/2018   NA 141 12/02/2018   K 3.6 12/02/2018   CL 112 (H) 12/02/2018   CREATININE 1.05 (H) 12/02/2018   BUN <5 (L) 12/02/2018   CO2 22 12/02/2018   TSH 0.285 (L) 11/29/2018   INR 1.1 11/29/2018   HGBA1C 5.2 11/29/2018    Coralie Keens, MD 12/02/2018 10:18 AM

## 2018-12-02 NOTE — Op Note (Addendum)
Laparoscopic Cholecystectomy with IOC Procedure Note  Indications: This patient presents with symptomatic gallbladder disease and will undergo laparoscopic cholecystectomy.  Pre-operative Diagnosis: cholecystitis with cholelithiasis   Post-operative Diagnosis: Same  Surgeon: Coralie Keens   Assistants: Brigid Re PA  Anesthesia: General endotracheal anesthesia  ASA Class: 3  Procedure Details  The patient was seen again in the Holding Room. The risks, benefits, complications, treatment options, and expected outcomes were discussed with the patient. The possibilities of reaction to medication, pulmonary aspiration, perforation of viscus, bleeding, recurrent infection, finding a normal gallbladder, the need for additional procedures, failure to diagnose a condition, the possible need to convert to an open procedure, and creating a complication requiring transfusion or operation were discussed with the patient. The likelihood of improving the patient's symptoms with return to their baseline status is good.  The patient and/or family concurred with the proposed plan, giving informed consent. The site of surgery properly noted. The patient was taken to Operating Room, identified as Brandi [name] and the procedure verified as Laparoscopic Cholecystectomy with Intraoperative Cholangiogram. A Time Out was held and the above information confirmed.  Prior to the induction of general anesthesia, antibiotic prophylaxis was administered. General endotracheal anesthesia was then administered and tolerated well. After the induction, the abdomen was prepped with Chloraprep and draped in the sterile fashion. The patient was positioned in the supine position.  Local anesthetic agent was injected into the skin near the umbilicus and an incision made. We dissected down to the abdominal fascia with blunt dissection.  The fascia was incised vertically and we entered the peritoneal cavity bluntly.  A  pursestring suture of 0-Vicryl was placed around the fascial opening.  The Hasson cannula was inserted and secured with the stay suture.  Pneumoperitoneum was then created with CO2 and tolerated well without any adverse changes in the patient's vital signs. A 5-mm port was placed in the subxiphoid position.  Two 5-mm ports were placed in the right upper quadrant. All skin incisions were infiltrated with a local anesthetic agent before making the incision and placing the trocars.   We positioned the patient in reverse Trendelenburg, tilted slightly to the patient's left.  The gallbladder was identified, the fundus grasped and retracted cephalad. Adhesions were lysed bluntly and with the electrocautery where indicated, taking care not to injure any adjacent organs or viscus. The infundibulum was grasped and retracted laterally, exposing the peritoneum overlying the triangle of Calot. This was then divided and exposed in a blunt fashion. A critical view of the cystic duct and cystic artery was obtained.  The cystic duct was clearly identified and bluntly dissected circumferentially. The cystic duct was ligated with a clip distally.   An incision was made in the cystic duct and the [facility] cholangiogram catheter introduced. The catheter was secured using a clip. A cholangiogram was then obtained which showed good visualization of the distal  biliary tree with no sign of filling defects or obstruction.  The duct was dilated.  Contrast flowed so briskly, I couldn't get much to go proximal. Contrast flowed easily into the duodenum. The catheter was then removed.   The cystic duct was then ligated with clips and divided. The cystic artery was identified, dissected free, ligated with clips and divided as well.   The gallbladder was dissected from the liver bed in retrograde fashion with the electrocautery. The gallbladder was removed and placed in an Endocatch sac. The liver bed was irrigated and inspected. Hemostasis was  achieved with the  electrocautery. Copious irrigation was utilized and was repeatedly aspirated until clear.  The gallbladder and Endocatch sac were then removed through the umbilical port site.  The pursestring suture was used to close the umbilical fascia.    We again inspected the right upper quadrant for hemostasis.  Pneumoperitoneum was released as we removed the trocars.  4-0 Monocryl was used to close the skin. Skin glue was then applied. The patient was then extubated and brought to the recovery room in stable condition. Instrument, sponge, and needle counts were correct at closure and at the conclusion of the case.   Findings: Cholecystitis with Cholelithiasis Dilated bile duct without obstruction  Estimated Blood Loss: Minimal         Drains: 0         Specimens: Gallbladder           Complications: None; patient tolerated the procedure well.         Disposition: PACU - hemodynamically stable.         Condition: stable

## 2018-12-02 NOTE — Anesthesia Procedure Notes (Signed)
Procedure Name: Intubation Date/Time: 12/02/2018 11:06 AM Performed by: Elayne Snare, CRNA Pre-anesthesia Checklist: Patient identified, Emergency Drugs available, Suction available and Patient being monitored Patient Re-evaluated:Patient Re-evaluated prior to induction Oxygen Delivery Method: Circle System Utilized Preoxygenation: Pre-oxygenation with 100% oxygen Induction Type: IV induction Ventilation: Mask ventilation without difficulty Laryngoscope Size: Mac and 3 Grade View: Grade I Tube type: Oral Tube size: 7.0 mm Number of attempts: 1 Airway Equipment and Method: Stylet and Oral airway Placement Confirmation: ETT inserted through vocal cords under direct vision,  positive ETCO2 and breath sounds checked- equal and bilateral Secured at: 19 cm Tube secured with: Tape Dental Injury: Teeth and Oropharynx as per pre-operative assessment

## 2018-12-02 NOTE — Progress Notes (Signed)
PROGRESS NOTE    Brandi [name]  OMV:[id number] DOB: 14-Sep-1945 DOA: 11/28/2018 PCP: Vincente Liberty, MD   Brief Narrative:  73 y.o. female with medical history significant of hypertension, GERD, depression, anxiety, GI bleeding, gastric ulcer, insomnia, who presents with dark stool, and syncope. Patient having intermittent dark tarry stool for more than 2 weeks small amount of bright red blood on 6/14.  Has lost about 10 pounds recently has had poor appetite decreased intake.  In the ER she had work-up along with CT scan and on return from CT scan appeared to have "syncopal episode" was found to be "not responsive" by technician.  in ER, significantly hypokalemic, hemoglobin 12.5 (10.7 on 04/18/2016), negative FOBT,sent COVID-19 test, heart rate 43- 101, oxygen saturation 100% on room air, blood pressure 163/70 GI was consulted regarding possible GI bleed.  Since imaging studies showed gallbladder wall thickening and ultrasound showed possible cholecystitis, HIDA scan was obtained and general surgery was consulted given normal HIDA scan with gallbladder EF 0%. 6/16 -EGD/COLO  12/01/2018: Patient seen.  Results of the HIDA scan seen.  Plan is to proceed with lap cholecystectomy.  Surgical input is appreciated.  Otherwise, no significant complaints.  12/02/2018: Patient just returned from lap cholecystectomy.  Concerns regarding elevated blood pressure.  Blood pressure checked manually, systolic blood pressure is in 160s to 170s millimeters mercury.  Will start patient on Coreg.  Will monitor closely.  Otherwise, no complaints.  Patient's pain is controlled.  LFTs are improving.  ALT is down to 143 (from 215), and AST is down to 78 from 249.  Albumin is 2.8.  Potassium is 3.6.  Subjective: No fever or chills No shortness of breath No abdominal pain.  Assessment & Plan:   Syncope after returning from CT scan in the ER: Unclear etiology could be from electrolyte imbalance/?  Arrhythmia.  EKG has  prolonged QTC.  Follow-up 2D echocardiogram.  Check orthostatic vital signs, continue IV fluids. Patient has pacer pads in place.  Cardiology consulted 12/02/2018: We will keep potassium greater than 4.  Repeat EKG in the morning  Dark stools more than 2 weeks at home, suspect GI bleeding. But FOBT negative in ER. Currently no bowel movement.hb  On admission 12.5 (10.7 on 04/18/2016), and hemoglobin overall stable.  For EGD and colonoscopy today.  PPI 12/02/2018: Check CBC in the morning.  Hemoglobin done on 12/01/2018 was 9 g/dL. Recent Labs  Lab 11/29/18 0235 11/29/18 0913 11/29/18 1500 11/30/18 0456 12/01/18 0415  HGB 10.9* 10.7* 10.3* 11.1* 9.0*  HCT 32.3* 32.5* 30.6* 33.9* 26.8*   Hypokalemia, unknown etiology 2.8, replaced aggressively and has improved.she reports she has had similar hypokalemia in the past and was taking supplementation but has not needed recently.  Repeat this afternoon. 12/02/2018: Potassium today is 3.6.  Acute kidney injury with creatinine 1.3,improved with hydration. 12/02/2018: Creatinine is 1.05 today.  QT prolongation at 538 :Monitor electrolytes, repleted potassium aggressively. Repeat EKG in am.Mag is stable. 12/02/2018: Patient has chronically prolonged QTc interval.  Optimize electrolytes.  Cholelithiasis with thickened gallbladder wall ultrasound reported as acute cholecystitis.  Unclear etiology.patient does not complain of significant abdominal pain.  Noted elevated LFTs and HIDA scan taht came back with abnormal ejection fraction of 0% otherwise able to visualize liver, mediastinum, gallbladder and bowel. Discussed w GI and consulted Gen surgery, following. MRCP ordered.  Negative for CBD stone surgery is planning for lap chole and IOC pending cardiology clearance 12/01/2018: HIDA scan revealed likely a cholecystitis with cholelithiasis.  Surgical team is already on board.  For possible cholecystectomy. 12/02/2018: Patient status post laparoscopic  cholecystectomy.  Patient has remained stable.  Transaminitis: LFTs uptrending tb/ast/alt/alp  1.3/161/64/98.> 4.4/824/312/160.CBD 6.3 mm ultrasound.  Appreciate GI input obtaining MRCP. 12/02/2018: LFTs improving.  Elevated troponin 0.1-->0.05, also with chest pain. Likely demand mismatch. Cardiology has been consulted for further recommendation.  Aspirin has not been started due to dark stool/GI bleed concern.   Recent fall and pain in the right hip, obtained hip x-ray and appears stable.She reports she has had hip surgeries and is followed by her orthopedic surgery.  Pain control  GERD: PPI  Essential hypertension, benign: Blood pressure is controlled.  Insomnia/Anxiety: On as needed Valium at home.  Hold home Restoril due to prolonged QTC.  Suppressed TSH at 0.28 , normal free T4 T3  DVT prophylaxis: SCD Code Status: full code Family Communication: plan of care discussed with the patient Disposition Plan: Remains inpatient pending clinical improvement.   Consultants:  Cardiology  Procedures:  EGD  Normal esophagus and Z line Small hiatal hernia Antral gastritis, biopsies taken for H pylori. Unremarkable duodenum  Colonsocopy  Hepatic flexure and descending colon polyps- removed Internal hemorrhoids.  CT abdomen/pelvis: 1.Diffuse gallbladder wall thickening, maximum of 9 mm. The etiology of this is unclear. Recommend further assessment with limited right upper quadrant ultrasound. 2. Mild haziness along the wall of the lower esophagus at the gastroesophageal junction. This may reflect inflammation. Consider follow-up upper endoscopy for further assessment. 3. No evidence of a bowel mass, obstruction or inflammation. No other findings to account for GI bleeding.  RUQ [place] Multiple tiny gallstones noted. Gallbladder sludge noted. Gallbladder wall is thickened at 14.1 mm suggesting cholecystitis. No biliary distention  PAST 2016 ENDOSCOPIC  small hiatal hernia with no  source of upper GI tract bleeding noted. RECOMMENDATIONS: monitor stools and hemoglobin and consider capsule endoscopy if continues to have melenic stools associated with drop in hemoglobin.  Antimicrobials: Anti-infectives (From admission, onward)   Start     Dose/Rate Route Frequency Ordered Stop   12/02/18 1000  cefTRIAXone (ROCEPHIN) 2 g in sodium chloride 0.9 % 100 mL IVPB     2 g 200 mL/hr over 30 Minutes Intravenous To Short Stay 12/01/18 1119 12/02/18 1109       Objective: Vitals:   12/02/18 1503 12/02/18 1600 12/02/18 1615 12/02/18 1648  BP: (!) 183/121 (!) 195/122 (!) 166/92 (!) 167/104  Pulse:    86  Resp:    18  Temp:    98.1 F (36.7 C)  TempSrc:    Oral  SpO2:      Weight:      Height:        Intake/Output Summary (Last 24 hours) at 12/02/2018 1810 Last data filed at 12/02/2018 1400 Gross per 24 hour  Intake 350 ml  Output 220 ml  Net 130 ml   Filed Weights   12/01/18 0434 12/01/18 0610 12/02/18 0509  Weight: 54.6 kg 54.5 kg 54.3 kg   Weight change: -0.318 kg  Body mass index is 20.55 kg/m.  Intake/Output from previous day: 06/17 0701 - 06/18 0700 In: 360 [P.O.:360] Out: 600 [Urine:600] Intake/Output this shift: Total I/O In: 350 [I.V.:350] Out: 20 [Blood:20]  Examination:  General exam: Appears calm and comfortable,Not in distress HEENT:PERRL,Oral mucosa moist, Ear/Nose normal on gross exam Respiratory system: Bilateral equal air entry, normal vesicular breath sounds, no wheezes or crackles  Cardiovascular system: S1 & S2 heard,No JVD, murmurs. Gastrointestinal system: Abdomen is  soft, tender in epigastric and right abdomen with deep palpationbut  no Murphy sign, non distended, BS +  Nervous System:Alert and oriented. No focal neurological deficits/moving extremities, sensation intact. Extremities: No edema, no clubbing, distal peripheral pulses palpable. Skin: No rashes, lesions, no icterus MSK: Normal muscle bulk,tone  ,power  Medications:  Scheduled Meds:  amLODipine  5 mg Oral Daily   carvedilol  3.125 mg Oral BID WC   estrogen (conjugated)-medroxyprogesterone  1 tablet Oral QHS   feeding supplement (ENSURE ENLIVE)  237 mL Oral BID BM   fentaNYL       pantoprazole  40 mg Oral BID   polyethylene glycol-electrolytes  2,000 mL Oral Once   sodium chloride flush  3 mL Intravenous Q12H   Continuous Infusions:  sodium chloride     lactated ringers 10 mL/hr at 12/02/18 0847    Data Reviewed: I have personally reviewed following labs and imaging studies  CBC: Recent Labs  Lab 11/29/18 0235 11/29/18 0913 11/29/18 1500 11/30/18 0456 12/01/18 0415  WBC 10.0 7.8 7.5 10.7* 6.4  HGB 10.9* 10.7* 10.3* 11.1* 9.0*  HCT 32.3* 32.5* 30.6* 33.9* 26.8*  MCV 90.5 92.6 93.0 93.6 93.1  PLT 310 275 290 263 124   Basic Metabolic Panel: Recent Labs  Lab 11/28/18 1545  11/29/18 0913 11/29/18 1500 11/29/18 2107 11/30/18 0456 12/01/18 0415 12/02/18 0515  NA 138   < >  --  142 141 142 139 141  K 2.6*   < >  --  3.3* 3.4* 3.6 3.7 3.6  CL 91*   < >  --  107 108 108 113* 112*  CO2 33*   < >  --  27 24 25  21* 22  GLUCOSE 110*   < >  --  107* 103* 96 91 86  BUN 9   < >  --  <5* 5* 5* <5* <5*  CREATININE 1.31*   < >  --  1.04* 0.95 0.89 0.97 1.05*  CALCIUM 10.1   < >  --  8.6* 8.4* 8.5* 8.0* 8.3*  MG 2.0  --  2.2  --   --   --   --   --    < > = values in this interval not displayed.   GFR: Estimated Creatinine Clearance: 42.1 mL/min (A) (by C-G formula based on SCr of 1.05 mg/dL (H)). Liver Function Tests: Recent Labs  Lab 11/28/18 1545 11/29/18 0913 11/30/18 0456 12/01/18 0415 12/02/18 0515  AST 30 161* 825* 249* 78*  ALT 16 64* 312* 215* 143*  ALKPHOS 55 98 160* 136* 113  BILITOT 1.4* 1.3* 4.4* 1.6* 1.2  PROT 8.1 6.2* 6.2* 5.3* 5.6*  ALBUMIN 4.3 3.2* 3.2* 2.6* 2.8*   No results for input(s): LIPASE, AMYLASE in the last 168 hours. No results for input(s): AMMONIA in the last 168  hours. Coagulation Profile: Recent Labs  Lab 11/29/18 0235  INR 1.1   Cardiac Enzymes: Recent Labs  Lab 11/29/18 0235 11/29/18 0913  TROPONINI 0.10* 0.05*   BNP (last 3 results) No results for input(s): PROBNP in the last 8760 hours. HbA1C: No results for input(s): HGBA1C in the last 72 hours. CBG: Recent Labs  Lab 11/28/18 1912 11/30/18 0806 12/01/18 0446 12/01/18 0740  GLUCAP 125* 99 84 82   Lipid Profile: No results for input(s): CHOL, HDL, LDLCALC, TRIG, CHOLHDL, LDLDIRECT in the last 72 hours. Thyroid Function Tests: No results for input(s): TSH, T4TOTAL, FREET4, T3FREE, THYROIDAB in the last 72 hours. Anemia Panel:  No results for input(s): VITAMINB12, FOLATE, FERRITIN, TIBC, IRON, RETICCTPCT in the last 72 hours. Sepsis Labs: No results for input(s): PROCALCITON, LATICACIDVEN in the last 168 hours.  Recent Results (from the past 240 hour(s))  Novel Coronavirus,NAA,(SEND-OUT TO REF LAB - TAT 24-48 hrs); Hosp Order     Status: None   Collection Time: 11/28/18  8:05 PM   Specimen: Nasopharyngeal Swab; Respiratory  Result Value Ref Range Status   SARS-CoV-2, NAA NOT DETECTED NOT DETECTED Final    Comment: (NOTE) This test was developed and its performance characteristics determined by [employer]. This test has not been FDA cleared or approved. This test has been authorized by FDA under an Emergency Use Authorization (EUA). This test is only authorized for the duration of time the declaration that circumstances exist justifying the authorization of the emergency use of in vitro diagnostic tests for detection of SARS-CoV-2 virus and/or diagnosis of COVID-19 infection under section 564(b)(1) of the Act, 21 U.S.C. 662HUT-6(L)(4), unless the authorization is terminated or revoked sooner. When diagnostic testing is negative, the possibility of a false negative result should be considered in the context of a patient's recent exposures and the presence of  clinical signs and symptoms consistent with COVID-19. An individual without symptoms of COVID-19 and who is not shedding SARS-CoV-2 virus would expect to have a negative (not detected) result in this assay. Performed  At: [facility] [address] [place], [place] [id number] Rush Farmer MD CL:[id number]    [place]  Final    Comment: Performed at [facility], [place] [address], [place], [place] [address]  Surgical pcr screen     Status: None   Collection Time: 12/01/18 10:49 PM   Specimen: Nasal Mucosa; Nasal Swab  Result Value Ref Range Status   MRSA, PCR NEGATIVE NEGATIVE Final   Staphylococcus aureus NEGATIVE NEGATIVE Final    Comment: (NOTE) The Xpert SA Assay (FDA approved for NASAL specimens in patients 27 years of age and older), is one component of a comprehensive surveillance program. It is not intended to diagnose infection nor to guide or monitor treatment. Performed at [facility], [place] [address] [place], [place] [address]       Radiology Studies: Dg Cholangiogram Operative  Result Date: 12/02/2018 CLINICAL DATA:  Calculus cholecystitis EXAM: INTRAOPERATIVE CHOLANGIOGRAM TECHNIQUE: Cholangiographic images from the C-arm fluoroscopic device were submitted for interpretation post-operatively. Please see the procedural report for the amount of contrast and the fluoroscopy time utilized. COMPARISON:  11/30/2018 FINDINGS: Intraop cholangiogram performed during laparoscopic procedure. The residual cystic duct and common bile duct are patent. Contrast easily drains into the duodenum. There is mild dilatation of the CBD without obstruction, stricture, or large filling defect. IMPRESSION: Patent biliary system.  Nonspecific CBD dilatation. Electronically Signed   By: Jerilynn Mages.  Shick M.D.   On: 12/02/2018 13:51   Mr Abdomen Mrcp Wo Contrast  Result Date: 12/01/2018 CLINICAL DATA:  Dark stool and syncope.  Abnormal liver function. EXAM:  MRI ABDOMEN WITHOUT CONTRAST  (INCLUDING MRCP) TECHNIQUE: Multiplanar multisequence MR imaging of the abdomen was performed. Heavily T2-weighted images of the biliary and pancreatic ducts were obtained, and three-dimensional MRCP images were rendered by post processing. COMPARISON:  HIDA scan 11/29/2018, ultrasound 11/29/2018, CT 11/28/2018 FINDINGS: Lower chest:  Lung bases are clear. Hepatobiliary: No intrahepatic biliary duct dilatation. There multiple small gallstones within lumen gallbladder. Several small gallstones within the neck of the gallbladder (image 4/15). Small to moderate amount pericholecystic fluid. No gallbladder  wall thickening. Common bile duct is normal caliber. No filling defect within the common bile duct. Small amount fluid along the margin of the RIGHT hepatic lobe. No hepatic steatosis. Pancreas: Normal pancreatic parenchymal intensity. No ductal dilatation or inflammation. Spleen: Normal spleen. Adrenals/urinary tract: Adrenal glands and kidneys are normal. Stomach/Bowel: Stomach and limited of the small bowel is unremarkable Vascular/Lymphatic: Abdominal aortic normal caliber. No retroperitoneal periportal lymphadenopathy. Musculoskeletal: No aggressive osseous lesion IMPRESSION: 1. Multiple gallstones in lumen gallbladder which extend in the neck of the gallbladder. Moderate amount of pericholecystic fluid. Patent cystic duct but poor gallbladder function on HIDA scan. Findings suggests chronic cholecystitis or potentially intermittent obstruction of the cystic duct (transient acute cholecystitis). 2. No choledocholithiasis. Electronically Signed   By: Suzy Bouchard M.D.   On: 12/01/2018 07:51   Mr 3d Recon At Scanner  Result Date: 12/01/2018 CLINICAL DATA:  Dark stool and syncope.  Abnormal liver function. EXAM: MRI ABDOMEN WITHOUT CONTRAST  (INCLUDING MRCP) TECHNIQUE: Multiplanar multisequence MR imaging of the abdomen was performed. Heavily T2-weighted images of the biliary  and pancreatic ducts were obtained, and three-dimensional MRCP images were rendered by post processing. COMPARISON:  HIDA scan 11/29/2018, ultrasound 11/29/2018, CT 11/28/2018 FINDINGS: Lower chest:  Lung bases are clear. Hepatobiliary: No intrahepatic biliary duct dilatation. There multiple small gallstones within lumen gallbladder. Several small gallstones within the neck of the gallbladder (image 4/15). Small to moderate amount pericholecystic fluid. No gallbladder wall thickening. Common bile duct is normal caliber. No filling defect within the common bile duct. Small amount fluid along the margin of the RIGHT hepatic lobe. No hepatic steatosis. Pancreas: Normal pancreatic parenchymal intensity. No ductal dilatation or inflammation. Spleen: Normal spleen. Adrenals/urinary tract: Adrenal glands and kidneys are normal. Stomach/Bowel: Stomach and limited of the small bowel is unremarkable Vascular/Lymphatic: Abdominal aortic normal caliber. No retroperitoneal periportal lymphadenopathy. Musculoskeletal: No aggressive osseous lesion IMPRESSION: 1. Multiple gallstones in lumen gallbladder which extend in the neck of the gallbladder. Moderate amount of pericholecystic fluid. Patent cystic duct but poor gallbladder function on HIDA scan. Findings suggests chronic cholecystitis or potentially intermittent obstruction of the cystic duct (transient acute cholecystitis). 2. No choledocholithiasis. Electronically Signed   By: Suzy Bouchard M.D.   On: 12/01/2018 07:51      LOS: 3 days   Time spent: More than 50% of that time was spent in counseling and/or coordination of care.  Bonnell Public, MD [facility]  12/02/2018, 6:10 PM

## 2018-12-03 LAB — CBC WITH DIFFERENTIAL/PLATELET
Abs Immature Granulocytes: 0.1 10*3/uL — ABNORMAL HIGH (ref 0.00–0.07)
Basophils Absolute: 0 10*3/uL (ref 0.0–0.1)
Basophils Relative: 0 %
Eosinophils Absolute: 0 10*3/uL (ref 0.0–0.5)
Eosinophils Relative: 0 %
HCT: 28 % — ABNORMAL LOW (ref 36.0–46.0)
Hemoglobin: 9.3 g/dL — ABNORMAL LOW (ref 12.0–15.0)
Immature Granulocytes: 1 %
Lymphocytes Relative: 7 %
Lymphs Abs: 0.9 10*3/uL (ref 0.7–4.0)
MCH: 31.1 pg (ref 26.0–34.0)
MCHC: 33.2 g/dL (ref 30.0–36.0)
MCV: 93.6 fL (ref 80.0–100.0)
Monocytes Absolute: 1 10*3/uL (ref 0.1–1.0)
Monocytes Relative: 8 %
Neutro Abs: 10.9 10*3/uL — ABNORMAL HIGH (ref 1.7–7.7)
Neutrophils Relative %: 84 %
Platelets: 252 10*3/uL (ref 150–400)
RBC: 2.99 MIL/uL — ABNORMAL LOW (ref 3.87–5.11)
RDW: 15.9 % — ABNORMAL HIGH (ref 11.5–15.5)
WBC: 13 10*3/uL — ABNORMAL HIGH (ref 4.0–10.5)
nRBC: 0 % (ref 0.0–0.2)

## 2018-12-03 LAB — RENAL FUNCTION PANEL
Albumin: 3.2 g/dL — ABNORMAL LOW (ref 3.5–5.0)
Anion gap: 8 (ref 5–15)
BUN: 9 mg/dL (ref 8–23)
CO2: 22 mmol/L (ref 22–32)
Calcium: 8.9 mg/dL (ref 8.9–10.3)
Chloride: 108 mmol/L (ref 98–111)
Creatinine, Ser: 1.12 mg/dL — ABNORMAL HIGH (ref 0.44–1.00)
GFR calc Af Amer: 57 mL/min — ABNORMAL LOW (ref >60)
GFR calc non Af Amer: 49 mL/min — ABNORMAL LOW (ref >60)
Glucose, Bld: 102 mg/dL — ABNORMAL HIGH (ref 70–99)
Phosphorus: 1.4 mg/dL — ABNORMAL LOW (ref 2.5–4.6)
Potassium: 4.6 mmol/L (ref 3.5–5.1)
Sodium: 138 mmol/L (ref 135–145)

## 2018-12-03 LAB — GLUCOSE, CAPILLARY
Glucose-Capillary: 120 mg/dL — ABNORMAL HIGH (ref 70–99)
Glucose-Capillary: 100 mg/dL — ABNORMAL HIGH (ref 70–99)

## 2018-12-03 LAB — MAGNESIUM: Magnesium: 1.8 mg/dL (ref 1.7–2.4)

## 2018-12-03 MED ORDER — TEMAZEPAM 7.5 MG PO CAPS
30.0000 mg | ORAL_CAPSULE | Freq: Every day | ORAL | Status: DC
  Administered 2018-12-03: 30 mg via ORAL
  Filled 2018-12-03: qty 4

## 2018-12-03 MED ORDER — OXYCODONE HCL 5 MG PO TABS
5.0000 mg | ORAL_TABLET | Freq: Four times a day (QID) | ORAL | Status: DC | PRN
  Administered 2018-12-03 – 2018-12-04 (×3): 10 mg via ORAL
  Administered 2018-12-04: 5 mg via ORAL
  Filled 2018-12-03 (×2): qty 2
  Filled 2018-12-03: qty 1
  Filled 2018-12-03: qty 2

## 2018-12-03 MED ORDER — DIAZEPAM 5 MG PO TABS
5.0000 mg | ORAL_TABLET | Freq: Four times a day (QID) | ORAL | Status: DC | PRN
  Administered 2018-12-04: 5 mg via ORAL
  Filled 2018-12-03: qty 1

## 2018-12-03 MED ORDER — ACETAMINOPHEN 325 MG PO TABS
650.0000 mg | ORAL_TABLET | Freq: Four times a day (QID) | ORAL | Status: DC
  Administered 2018-12-03 – 2018-12-04 (×5): 650 mg via ORAL
  Filled 2018-12-03 (×5): qty 2

## 2018-12-03 MED ORDER — ACETAMINOPHEN 650 MG RE SUPP: 650.0000 mg | Freq: Four times a day (QID) | RECTAL | Status: DC

## 2018-12-03 NOTE — Progress Notes (Signed)
PROGRESS NOTE    Brandi [name]  FYB:[id number] DOB: 1945/08/23 DOA: 11/28/2018 PCP: Vincente Liberty, MD   Brief Narrative:  73 y.o. female with medical history significant of hypertension, GERD, depression, anxiety, GI bleeding, gastric ulcer, insomnia, who presents with dark stool, and syncope. Patient having intermittent dark tarry stool for more than 2 weeks small amount of bright red blood on 6/14.  Has lost about 10 pounds recently has had poor appetite decreased intake.  In the ER she had work-up along with CT scan and on return from CT scan appeared to have "syncopal episode" was found to be "not responsive" by technician.  in ER, significantly hypokalemic, hemoglobin 12.5 (10.7 on 04/18/2016), negative FOBT,sent COVID-19 test, heart rate 43- 101, oxygen saturation 100% on room air, blood pressure 163/70 GI was consulted regarding possible GI bleed.  Since imaging studies showed gallbladder wall thickening and ultrasound showed possible cholecystitis, HIDA scan was obtained and general surgery was consulted given normal HIDA scan with gallbladder EF 0%. 6/16 -EGD/COLO  12/01/2018: Patient seen.  Results of the HIDA scan seen.  Plan is to proceed with lap cholecystectomy.  Surgical input is appreciated.  Otherwise, no significant complaints.  12/02/2018: Patient just returned from lap cholecystectomy.  Concerns regarding elevated blood pressure.  Blood pressure checked manually, systolic blood pressure is in 160s to 170s millimeters mercury.  Will start patient on Coreg.  Will monitor closely.  Otherwise, no complaints.  Patient's pain is controlled.  LFTs are improving.  ALT is down to 143 (from 215), and AST is down to 78 from 249.  Albumin is 2.8.  Potassium is 3.6.  12/03/2018: Patient seen alongside patient's nurse.  Patient reports that she did not have a good night last night.  Apparently, patient reported being in pain.  Patient also reported feeling lightheaded earlier today.  Blood  pressure control is improving.  Likely, patient be discharged back on tomorrow.  Subjective: No fever or chills No shortness of breath No abdominal pain. Mildly lightheaded while trying to use the restroom.  Assessment & Plan:   Syncope after returning from CT scan in the ER: Unclear etiology could be from electrolyte imbalance/?  Arrhythmia.  EKG has prolonged QTC.  Follow-up 2D echocardiogram.  Check orthostatic vital signs, continue IV fluids. Patient has pacer pads in place.  Cardiology consulted 12/02/2018: We will keep potassium greater than 4.   12/03/2018: Potassium is 4.6 today.  Dark stools more than 2 weeks at home, suspect GI bleeding. But FOBT negative in ER. Currently no bowel movement.hb  On admission 12.5 (10.7 on 04/18/2016), and hemoglobin overall stable.  For EGD and colonoscopy today.  PPI 12/02/2018: Check CBC in the morning.  Hemoglobin done on 12/01/2018 was 9 g/dL. This 19 2020: Hemoglobin is 9.3 g/dL today. Recent Labs  Lab 11/29/18 0913 11/29/18 1500 11/30/18 0456 12/01/18 0415 12/03/18 0440  HGB 10.7* 10.3* 11.1* 9.0* 9.3*  HCT 32.5* 30.6* 33.9* 26.8* 28.0*   Hypokalemia, unknown etiology 2.8, replaced aggressively and has improved.she reports she has had similar hypokalemia in the past and was taking supplementation but has not needed recently.  Repeat this afternoon. 12/02/2018: Potassium today is 3.6. 12/03/2018: Potassium is 4.6 today.  Acute kidney injury with creatinine 1.3,improved with hydration. 12/03/2018: Serum creatinine is 1.12 today.    QT prolongation at 538 :Monitor electrolytes, repleted potassium aggressively. Repeat EKG in am.Mag is stable. 12/02/2018: Patient has chronically prolonged QTc interval.  Optimize electrolytes.  Cholelithiasis with thickened gallbladder wall ultrasound reported  as acute cholecystitis.  Unclear etiology.patient does not complain of significant abdominal pain.  Noted elevated LFTs and HIDA scan taht came back with  abnormal ejection fraction of 0% otherwise able to visualize liver, mediastinum, gallbladder and bowel. Discussed w GI and consulted Gen surgery, following. MRCP ordered.  Negative for CBD stone surgery is planning for lap chole and IOC pending cardiology clearance 12/01/2018: HIDA scan revealed likely a cholecystitis with cholelithiasis.  Surgical team is already on board.  For possible cholecystectomy. 12/02/2018: Patient status post laparoscopic cholecystectomy.  Patient has remained stable. 12/03/2018: Postop day 1.  Surgical team is managing.  Transaminitis: LFTs uptrending tb/ast/alt/alp  1.3/161/64/98.> 4.4/824/312/160.CBD 6.3 mm ultrasound.  Appreciate GI input obtaining MRCP. 12/02/2018: LFTs improving.  Elevated troponin 0.1-->0.05, also with chest pain. Likely demand mismatch. Cardiology has been consulted for further recommendation.  Aspirin has not been started due to dark stool/GI bleed concern.   Recent fall and pain in the right hip, obtained hip x-ray and appears stable.She reports she has had hip surgeries and is followed by her orthopedic surgery.  Pain control  GERD: PPI  Essential hypertension, benign: Blood pressure is controlled.  Insomnia/Anxiety: On as needed Valium at home.  Hold home Restoril due to prolonged QTC.  Suppressed TSH at 0.28 , normal free T4 T3  DVT prophylaxis: SCD Code Status: full code Family Communication: plan of care discussed with the patient Disposition Plan: Remains inpatient pending clinical improvement.   Consultants:  Cardiology  Procedures:  EGD  Normal esophagus and Z line Small hiatal hernia Antral gastritis, biopsies taken for H pylori. Unremarkable duodenum  Colonsocopy  Hepatic flexure and descending colon polyps- removed Internal hemorrhoids.  CT abdomen/pelvis: 1.Diffuse gallbladder wall thickening, maximum of 9 mm. The etiology of this is unclear. Recommend further assessment with limited right upper quadrant  ultrasound. 2. Mild haziness along the wall of the lower esophagus at the gastroesophageal junction. This may reflect inflammation. Consider follow-up upper endoscopy for further assessment. 3. No evidence of a bowel mass, obstruction or inflammation. No other findings to account for GI bleeding.  RUQ [place] Multiple tiny gallstones noted. Gallbladder sludge noted. Gallbladder wall is thickened at 14.1 mm suggesting cholecystitis. No biliary distention  Lap cholecystectomy on 12/02/2018  PAST 2016 ENDOSCOPIC  small hiatal hernia with no source of upper GI tract bleeding noted. RECOMMENDATIONS: monitor stools and hemoglobin and consider capsule endoscopy if continues to have melenic stools associated with drop in hemoglobin.  Antimicrobials: Anti-infectives (From admission, onward)   Start     Dose/Rate Route Frequency Ordered Stop   12/02/18 1000  cefTRIAXone (ROCEPHIN) 2 g in sodium chloride 0.9 % 100 mL IVPB     2 g 200 mL/hr over 30 Minutes Intravenous To Short Stay 12/01/18 1119 12/02/18 1109       Objective: Vitals:   12/03/18 0734 12/03/18 0929 12/03/18 1029 12/03/18 1443  BP: (!) 145/83 (!) 178/90 (!) 168/106 134/75  Pulse: 81   93  Resp:    14  Temp: 98.6 F (37 C)   98.5 F (36.9 C)  TempSrc: Oral   Oral  SpO2: 99%   100%  Weight:      Height:        Intake/Output Summary (Last 24 hours) at 12/03/2018 1557 Last data filed at 12/03/2018 0900 Gross per 24 hour  Intake 477 ml  Output -  Net 477 ml   Filed Weights   12/01/18 0610 12/02/18 0509 12/03/18 0422  Weight: 54.5 kg 54.3 kg  55.3 kg   Weight change: 1.004 kg  Body mass index is 20.93 kg/m.  Intake/Output from previous day: 06/18 0701 - 06/19 0700 In: 1067 [P.O.:717; I.V.:350] Out: 20 [Blood:20] Intake/Output this shift: Total I/O In: 240 [P.O.:240] Out: -   Examination:  General exam: Appears calm and comfortable,Not in distress HEENT:PERRL,Oral mucosa moist, Ear/Nose normal on gross exam  Respiratory system: Bilateral equal air entry, normal vesicular breath sounds, no wheezes or crackles  Cardiovascular system: S1 & S2 heard,No JVD, murmurs. Gastrointestinal system: Abdomen is  soft, tender in epigastric and right abdomen with deep palpationbut  no Murphy sign, non distended, BS +  Nervous System:Alert and oriented. No focal neurological deficits/moving extremities, sensation intact. Extremities: No edema, no clubbing, distal peripheral pulses palpable.  Medications:  Scheduled Meds: . acetaminophen  650 mg Oral Q6H   Or  . acetaminophen  650 mg Rectal Q6H  . amLODipine  5 mg Oral Daily  . carvedilol  3.125 mg Oral BID WC  . estrogen (conjugated)-medroxyprogesterone  1 tablet Oral QHS  . feeding supplement (ENSURE ENLIVE)  237 mL Oral BID BM  . pantoprazole  40 mg Oral BID  . polyethylene glycol-electrolytes  2,000 mL Oral Once  . sodium chloride flush  3 mL Intravenous Q12H  . temazepam  30 mg Oral QHS   Continuous Infusions: . sodium chloride    . lactated ringers 10 mL/hr at 12/02/18 0847    Data Reviewed: I have personally reviewed following labs and imaging studies  CBC: Recent Labs  Lab 11/29/18 0913 11/29/18 1500 11/30/18 0456 12/01/18 0415 12/03/18 0440  WBC 7.8 7.5 10.7* 6.4 13.0*  NEUTROABS  --   --   --   --  10.9*  HGB 10.7* 10.3* 11.1* 9.0* 9.3*  HCT 32.5* 30.6* 33.9* 26.8* 28.0*  MCV 92.6 93.0 93.6 93.1 93.6  PLT 275 290 263 231 759   Basic Metabolic Panel: Recent Labs  Lab 11/28/18 1545  11/29/18 0913  11/29/18 2107 11/30/18 0456 12/01/18 0415 12/02/18 0515 12/03/18 0440  NA 138   < >  --    < > 141 142 139 141 138  K 2.6*   < >  --    < > 3.4* 3.6 3.7 3.6 4.6  CL 91*   < >  --    < > 108 108 113* 112* 108  CO2 33*   < >  --    < > 24 25 21* 22 22  GLUCOSE 110*   < >  --    < > 103* 96 91 86 102*  BUN 9   < >  --    < > 5* 5* <5* <5* 9  CREATININE 1.31*   < >  --    < > 0.95 0.89 0.97 1.05* 1.12*  CALCIUM 10.1   < >  --    <  > 8.4* 8.5* 8.0* 8.3* 8.9  MG 2.0  --  2.2  --   --   --   --   --  1.8  PHOS  --   --   --   --   --   --   --   --  1.4*   < > = values in this interval not displayed.   GFR: Estimated Creatinine Clearance: 39.8 mL/min (A) (by C-G formula based on SCr of 1.12 mg/dL (H)). Liver Function Tests: Recent Labs  Lab 11/28/18 1545 11/29/18 0913 11/30/18 0456 12/01/18 0415 12/02/18 0515 12/03/18  0440  AST 30 161* 825* 249* 78*  --   ALT 16 64* 312* 215* 143*  --   ALKPHOS 55 98 160* 136* 113  --   BILITOT 1.4* 1.3* 4.4* 1.6* 1.2  --   PROT 8.1 6.2* 6.2* 5.3* 5.6*  --   ALBUMIN 4.3 3.2* 3.2* 2.6* 2.8* 3.2*   No results for input(s): LIPASE, AMYLASE in the last 168 hours. No results for input(s): AMMONIA in the last 168 hours. Coagulation Profile: Recent Labs  Lab 11/29/18 0235  INR 1.1   Cardiac Enzymes: Recent Labs  Lab 11/29/18 0235 11/29/18 0913  TROPONINI 0.10* 0.05*   BNP (last 3 results) No results for input(s): PROBNP in the last 8760 hours. HbA1C: No results for input(s): HGBA1C in the last 72 hours. CBG: Recent Labs  Lab 11/30/18 0806 12/01/18 0446 12/01/18 0740 12/03/18 0639 12/03/18 0729  GLUCAP 99 84 82 120* 100*   Lipid Profile: No results for input(s): CHOL, HDL, LDLCALC, TRIG, CHOLHDL, LDLDIRECT in the last 72 hours. Thyroid Function Tests: No results for input(s): TSH, T4TOTAL, FREET4, T3FREE, THYROIDAB in the last 72 hours. Anemia Panel: No results for input(s): VITAMINB12, FOLATE, FERRITIN, TIBC, IRON, RETICCTPCT in the last 72 hours. Sepsis Labs: No results for input(s): PROCALCITON, LATICACIDVEN in the last 168 hours.  Recent Results (from the past 240 hour(s))  Novel Coronavirus,NAA,(SEND-OUT TO REF LAB - TAT 24-48 hrs); Hosp Order     Status: None   Collection Time: 11/28/18  8:05 PM   Specimen: Nasopharyngeal Swab; Respiratory  Result Value Ref Range Status   SARS-CoV-2, NAA NOT DETECTED NOT DETECTED Final    Comment: (NOTE) This test  was developed and its performance characteristics determined by [employer]. This test has not been FDA cleared or approved. This test has been authorized by FDA under an Emergency Use Authorization (EUA). This test is only authorized for the duration of time the declaration that circumstances exist justifying the authorization of the emergency use of in vitro diagnostic tests for detection of SARS-CoV-2 virus and/or diagnosis of COVID-19 infection under section 564(b)(1) of the Act, 21 U.S.C. 696EXB-2(W)(4), unless the authorization is terminated or revoked sooner. When diagnostic testing is negative, the possibility of a false negative result should be considered in the context of a patient's recent exposures and the presence of clinical signs and symptoms consistent with COVID-19. An individual without symptoms of COVID-19 and who is not shedding SARS-CoV-2 virus would expect to have a negative (not detected) result in this assay. Performed  At: [facility] [address] [place], [place] [id number] Rush Farmer MD VO:[id number]    [place]  Final    Comment: Performed at [facility], [place] [address] [place], [place] [address]  Surgical pcr screen     Status: None   Collection Time: 12/01/18 10:49 PM   Specimen: Nasal Mucosa; Nasal Swab  Result Value Ref Range Status   MRSA, PCR NEGATIVE NEGATIVE Final   Staphylococcus aureus NEGATIVE NEGATIVE Final    Comment: (NOTE) The Xpert SA Assay (FDA approved for NASAL specimens in patients 67 years of age and older), is one component of a comprehensive surveillance program. It is not intended to diagnose infection nor to guide or monitor treatment. Performed at [facility], [place] [address], [place], [place] [address]       Radiology Studies: Dg Cholangiogram Operative  Result Date: 12/02/2018 CLINICAL DATA:  Calculus cholecystitis EXAM: INTRAOPERATIVE CHOLANGIOGRAM  TECHNIQUE: Cholangiographic images  from the C-arm fluoroscopic device were submitted for interpretation post-operatively. Please see the procedural report for the amount of contrast and the fluoroscopy time utilized. COMPARISON:  11/30/2018 FINDINGS: Intraop cholangiogram performed during laparoscopic procedure. The residual cystic duct and common bile duct are patent. Contrast easily drains into the duodenum. There is mild dilatation of the CBD without obstruction, stricture, or large filling defect. IMPRESSION: Patent biliary system.  Nonspecific CBD dilatation. Electronically Signed   By: Jerilynn Mages.  Shick M.D.   On: 12/02/2018 13:51      LOS: 4 days   Time spent: More than 50% of that time was spent in counseling and/or coordination of care.  Bonnell Public, MD [facility]  12/03/2018, 3:57 PM

## 2018-12-03 NOTE — Anesthesia Postprocedure Evaluation (Signed)
Anesthesia Post Note  Patient: Brandi [name]  Procedure(s) Performed: LAPAROSCOPIC CHOLECYSTECTOMY WITH INTRAOPERATIVE CHOLANGIOGRAM (N/A Abdomen)     Patient location during evaluation: PACU Anesthesia Type: General Level of consciousness: sedated Pain management: pain level controlled Vital Signs Assessment: post-procedure vital signs reviewed and stable Respiratory status: spontaneous breathing Cardiovascular status: stable Postop Assessment: no apparent nausea or vomiting Anesthetic complications: no    Last Vitals:  Vitals:   12/03/18 0734 12/03/18 0929  BP: (!) 145/83 (!) 178/90  Pulse: 81   Resp:    Temp: 37 C   SpO2: 99%     Last Pain:  Vitals:   12/03/18 0929  TempSrc:   PainSc: 0-No pain   Pain Goal: Patients Stated Pain Goal: 0 (12/02/18 2000)                 Huston Foley

## 2018-12-03 NOTE — Progress Notes (Signed)
[facility] Progress Note  1 Day Post-Op  Subjective: CC: pain and lightheadedness Patient slightly dizzy with getting up, feels like it is getting better but she is concerned about going home. Abdominal pain overnight, very sore. Discussed pain regimen today and mobilizing to improve distention as well. Tolerating liquids, denies nausea. Planning to get up and walk after breakfast this AM.   Objective: Vital signs in last 24 hours: Temp:  [97 F (36.1 C)-98.8 F (37.1 C)] 98.6 F (37 C) (06/19 0734) Pulse Rate:  [79-130] 81 (06/19 0734) Resp:  [10-23] 16 (06/19 0023) BP: (121-196)/(71-122) 178/90 (06/19 0929) SpO2:  [98 %-100 %] 99 % (06/19 0734) Weight:  [55.3 kg] 55.3 kg (06/19 0422) Last BM Date: 11/30/18  Intake/Output from previous day: 06/18 0701 - 06/19 0700 In: 1067 [P.O.:717; I.V.:350] Out: 20 [Blood:20] Intake/Output this shift: Total I/O In: 240 [P.O.:240] Out: -   PE: Gen:  Alert, NAD, pleasant Card:  Regular rate and rhythm, pedal pulses 2+ BL Pulm:  Normal effort, clear to auscultation bilaterally Abd: Soft, appropriately tender, minimally distended, +BS, no HSM, incisions C/D/I Skin: warm and dry, no rashes  Psych: A&Ox3   Lab Results:  Recent Labs    12/01/18 0415 12/03/18 0440  WBC 6.4 13.0*  HGB 9.0* 9.3*  HCT 26.8* 28.0*  PLT 231 252   BMET Recent Labs    12/02/18 0515 12/03/18 0440  NA 141 138  K 3.6 4.6  CL 112* 108  CO2 22 22  GLUCOSE 86 102*  BUN <5* 9  CREATININE 1.05* 1.12*  CALCIUM 8.3* 8.9   PT/INR No results for input(s): LABPROT, INR in the last 72 hours. CMP     Component Value Date/Time   NA 138 12/03/2018 0440   K 4.6 12/03/2018 0440   CL 108 12/03/2018 0440   CO2 22 12/03/2018 0440   GLUCOSE 102 (H) 12/03/2018 0440   BUN 9 12/03/2018 0440   CREATININE 1.12 (H) 12/03/2018 0440   CREATININE 1.01 (H) 04/18/2016 1443   CALCIUM 8.9 12/03/2018 0440   PROT 5.6 (L) 12/02/2018 0515   ALBUMIN 3.2 (L)  12/03/2018 0440   AST 78 (H) 12/02/2018 0515   ALT 143 (H) 12/02/2018 0515   ALKPHOS 113 12/02/2018 0515   BILITOT 1.2 12/02/2018 0515   GFRNONAA 49 (L) 12/03/2018 0440   GFRAA 57 (L) 12/03/2018 0440   Lipase  No results found for: LIPASE     Studies/Results: Dg Cholangiogram Operative  Result Date: 12/02/2018 CLINICAL DATA:  Calculus cholecystitis EXAM: INTRAOPERATIVE CHOLANGIOGRAM TECHNIQUE: Cholangiographic images from the C-arm fluoroscopic device were submitted for interpretation post-operatively. Please see the procedural report for the amount of contrast and the fluoroscopy time utilized. COMPARISON:  11/30/2018 FINDINGS: Intraop cholangiogram performed during laparoscopic procedure. The residual cystic duct and common bile duct are patent. Contrast easily drains into the duodenum. There is mild dilatation of the CBD without obstruction, stricture, or large filling defect. IMPRESSION: Patent biliary system.  Nonspecific CBD dilatation. Electronically Signed   By: Jerilynn Mages.  Shick M.D.   On: 12/02/2018 13:51    Anti-infectives: Anti-infectives (From admission, onward)   Start     Dose/Rate Route Frequency Ordered Stop   12/02/18 1000  cefTRIAXone (ROCEPHIN) 2 g in sodium chloride 0.9 % 100 mL IVPB     2 g 200 mL/hr over 30 Minutes Intravenous To Short Stay 12/01/18 1119 12/02/18 1109       Assessment/Plan Acute kidney injury Syncope/mild troponin elevation/QTc prolongation- 17 beats SVT6/16/20  Hypokalemia - being replaced Hx hypertension Suppressed TSH Hx gastric ulcers/GERD Strong family hx of colon cancer Hx anxiety/depression/insomnia Remote hx of tobacco use COVID - neg 07/2018& 11/28/18   Dark stools/possible rectal bleeding Cholelithiasis/gallbladder wall thickening/abnormal gallbladder function S/P laparoscopic cholecystectomy with IOC 12/02/18 Dr. Ninfa Linden - POD#1 - tolerating diet  - needs to mobilize more - increased oxy ir to 5-10 mg q6h prn, scheduled  tylenol q6h - stable for discharge from a surgical perspective but may be more ready tomorrow with lightheadedness and pain    FEN: reg diet ID: rocephin pre-op DVT: SCDs, ok for chemical VTE from a surgical standpoint Follow-up: [facility] POC: Yolette Hastings (husband) [phone] or [phone]; Amedeo Plenty (sister) [phone]  Patient doing well POD#1. Continue to work on pain control and mobilizing today. Having some lightheadedness, maybe from colonoscopy prep and anesthesia yesterday. Likely home tomorrow AM.   LOS: 4 days    Brigid Re , [facility] 12/03/2018, 9:33 AM Pager: [phone] Consults: [phone]

## 2018-12-03 NOTE — Plan of Care (Signed)
  Problem: Safety: Goal: Ability to remain free from injury will improve Outcome: Completed/Met

## 2018-12-03 NOTE — Care Management Important Message (Signed)
Important Message  Patient Details  Name: LEANDRA VANDERWEELE MRN: [id number] Date of Birth: [birth year]   Medicare Important Message Given:  Yes     Annalysa, Mohammad 12/03/2018, 12:13 PM

## 2018-12-04 ENCOUNTER — Encounter (HOSPITAL_COMMUNITY): Payer: Self-pay | Admitting: Surgery

## 2018-12-04 LAB — GLUCOSE, CAPILLARY: Glucose-Capillary: 101 mg/dL — ABNORMAL HIGH (ref 70–99)

## 2018-12-04 MED ORDER — CARVEDILOL 3.125 MG PO TABS: 3.1250 mg | ORAL_TABLET | Freq: Two times a day (BID) | ORAL | 0 refills | Status: DC

## 2018-12-04 MED ORDER — OXYCODONE HCL 5 MG PO TABS: 5.0000 mg | ORAL_TABLET | ORAL | 0 refills | Status: AC | PRN

## 2018-12-04 NOTE — Progress Notes (Signed)
Assessment & Plan: Cholelithiasis/gallbladder wall thickening/abnormal gallbladder function S/P laparoscopic cholecystectomy with IOC 12/02/18 Dr. Ninfa Linden - tolerating diet  - oxy ir to 5-10 mg q6h prn - will give Rx  - stable for discharge from a surgical perspective  - will arrange follow up with [facility] office in 2 weeks        Armandina Gemma, MD       [facility]       Office: [phone]   Chief Complaint: Abdominal pain  Subjective: Patient in bed, comfortable, anticipating discharge home today.  Objective: Vital signs in last 24 hours: Temp:  [98.3 F (36.8 C)-98.6 F (37 C)] 98.3 F (36.8 C) (06/20 0432) Pulse Rate:  [93-96] 96 (06/19 1631) Resp:  [14-16] 16 (06/20 0432) BP: (116-148)/(69-91) 123/69 (06/20 0432) SpO2:  [97 %-100 %] 100 % (06/20 0432) Weight:  [55.2 kg] 55.2 kg (06/20 0432) Last BM Date: 12/02/18  Intake/Output from previous day: 06/19 0701 - 06/20 0700 In: 720 [P.O.:720] Out: 200 [Urine:200] Intake/Output this shift: No intake/output data recorded.  Physical Exam: HEENT - sclerae clear, mucous membranes moist Neck - soft Chest - clear bilaterally Cor - RRR Abdomen - soft, wounds dry and intact Ext - no edema, non-tender Neuro - alert & oriented, no focal deficits  Lab Results:  Recent Labs    12/03/18 0440  WBC 13.0*  HGB 9.3*  HCT 28.0*  PLT 252   BMET Recent Labs    12/02/18 0515 12/03/18 0440  NA 141 138  K 3.6 4.6  CL 112* 108  CO2 22 22  GLUCOSE 86 102*  BUN <5* 9  CREATININE 1.05* 1.12*  CALCIUM 8.3* 8.9   PT/INR No results for input(s): LABPROT, INR in the last 72 hours. Comprehensive Metabolic Panel:    Component Value Date/Time   NA 138 12/03/2018 0440   NA 141 12/02/2018 0515   K 4.6 12/03/2018 0440   K 3.6 12/02/2018 0515   CL 108 12/03/2018 0440   CL 112 (H) 12/02/2018 0515   CO2 22 12/03/2018 0440   CO2 22 12/02/2018 0515   BUN 9 12/03/2018 0440   BUN <5 (L) 12/02/2018 0515    CREATININE 1.12 (H) 12/03/2018 0440   CREATININE 1.05 (H) 12/02/2018 0515   CREATININE 1.01 (H) 04/18/2016 1443   CREATININE 0.90 06/14/2013 1421   GLUCOSE 102 (H) 12/03/2018 0440   GLUCOSE 86 12/02/2018 0515   CALCIUM 8.9 12/03/2018 0440   CALCIUM 8.3 (L) 12/02/2018 0515   AST 78 (H) 12/02/2018 0515   AST 249 (H) 12/01/2018 0415   ALT 143 (H) 12/02/2018 0515   ALT 215 (H) 12/01/2018 0415   ALKPHOS 113 12/02/2018 0515   ALKPHOS 136 (H) 12/01/2018 0415   BILITOT 1.2 12/02/2018 0515   BILITOT 1.6 (H) 12/01/2018 0415   PROT 5.6 (L) 12/02/2018 0515   PROT 5.3 (L) 12/01/2018 0415   ALBUMIN 3.2 (L) 12/03/2018 0440   ALBUMIN 2.8 (L) 12/02/2018 0515    Studies/Results: Dg Cholangiogram Operative  Result Date: 12/02/2018 CLINICAL DATA:  Calculus cholecystitis EXAM: INTRAOPERATIVE CHOLANGIOGRAM TECHNIQUE: Cholangiographic images from the C-arm fluoroscopic device were submitted for interpretation post-operatively. Please see the procedural report for the amount of contrast and the fluoroscopy time utilized. COMPARISON:  11/30/2018 FINDINGS: Intraop cholangiogram performed during laparoscopic procedure. The residual cystic duct and common bile duct are patent. Contrast easily drains into the duodenum. There is mild dilatation of the CBD without obstruction, stricture, or large filling defect.  IMPRESSION: Patent biliary system.  Nonspecific CBD dilatation. Electronically Signed   By: Jerilynn Mages.  Shick M.D.   On: 12/02/2018 13:51      Armandina Gemma 12/04/2018  Patient ID: Johnathan Hausen, female   DOB: 1946/05/07, 73 y.o.   MRN: [id number]

## 2018-12-04 NOTE — Discharge Summary (Signed)
Physician Discharge Summary  Patient ID: Brandi [name] MRN: [id number] DOB/AGE: 02-12-1946 73 y.o.  Admit date: 11/28/2018 Discharge date: 12/04/2018  Admission Diagnoses:  Discharge Diagnoses:  Principal Problem:   Syncope Active Problems:   GERD (gastroesophageal reflux disease)   Essential hypertension, benign   Hypokalemia   Insomnia   QT prolongation   Anxiety   Dark stools   Palpitations   Elevated troponin   Acute cholecystitis   Elevated LFTs   Discharged Condition: stable  Hospital Course: Patient is a 73 year old African-American female with past medical history significant for hypertension, GERD, depression, anxiety, GI bleeding, gastric ulcer and insomnia.  Patient presented with rectal bleed and syncope.  Work-up revealed prolonged QTc interval, electrolyte abnormalities (hypokalemia) and imaging studies suggestive of acute cholecystitis.  Work-up and management of the syncope was directed by the cardiology team.  Patient will follow cardiology team on discharge.  Patient has chronically prolonged QTc interval.  Patient underwent laparoscopic cholecystectomy during the hospital stay.  Patient's blood pressure was initially elevated postop, but has been controlled with Coreg 3.125 Mg p.o. twice daily and Norvasc 5 Mg p.o. once daily.  Will discharge patient home only on Coreg.  Patient has been advised to check her blood pressure at least 4 times daily, document readings and review with the primary care provider.  Patient was also advised to contact the primary care provider for significantly elevated or low blood pressure.  Patient has been optimized and will be discharge home to the care of the primary care provider, cardiology group and the surgery group.    Syncope: This was reported after returning from CT scan in the ER. Etiology suspected to be related to electrolyte imbalance/possible Arrhythmia.   EKG has prolonged QTC but patient has chronically prolonged QTc  interval. Cardiology team was consulted to direct care. Electrolytes were corrected. Patient also reported rectal bleed. Electrolytes have been optimized. Syncope has resolved. Consider referral to electrophysiology team/cardiology team if syncope persists.  Dark stools:  Reported to have been ongoing for greater than 2 weeks prior to presentation.   FOBT negative in ER. This was managed supportively.  Patient underwent EGD and colonoscopy during the hospital stay.  Continue to monitor hemoglobin and manage expectantly.  Hypokalemia: This was monitored and repleted during the hospital stay. Continue to monitor potassium and other electrolytes on discharge.  Acute kidney injury: This was mild.  Serum creatinine peaked at 1.3.   AKI has resolved.   Continue to monitor renal function closely.      QTc prolongation: Patient has chronically prolonged QTc interval  Continue to monitor and optimize electrolytes.   Consider electrophysiology/cardiology referral if syncope persists.   Continue to monitor QTc interval closely.   Avoid medications that can prolong QTc interval whenever possible.     Acute cholecystitis/cholelithiasis  Worked up extensively. HIDA scan revealed acute cholecystitis with gallbladder ejection fraction of 0%. Patient underwent laparoscopic cholecystectomy. Patient will follow with the surgery team on discharge.  Transaminitis:  Resolving. May be related to above.  Hypertension:  Continue to optimize. Patient be discharged on Coreg 3.125 Mg p.o. twice daily. Monitor blood pressure closely reviewed with your primary care provider.  GERD: PPI  Consults: cardiology, gastroenterology and general surgery  Significant Diagnostic Studies:  -AST peaked at 825 during the hospital stay, and was down to 78 prior to discharge. -ALT peaked at 312, was down to 143 prior to discharge. -Colonoscopy revealed hepatic flexure and descending colon polyps, as  well as,  nonbleeding internal hemorrhoids. -EGD done on 11/30/2018 revealed erythematous antral mucosa that was biopsied. -Intraoperative cholangiogram revealed "Patent biliary system.  Nonspecific CBD dilatation". -MRCP revealed gallstones in the gallbladder with signs of acute cholecystitis. -HIDA scan revealed acute cholecystitis with Gallbladder ejection fraction of 0%. -EKG done on presentation revealed QTc interval of 669 ms. -CT scan of the abdomen and pelvis done on presentation revealed gallbladder wall thickening.   Treatments: Laparoscopic cholecystectomy.  Discharge Exam: Blood pressure 123/69, pulse 96, temperature 98.3 F (36.8 C), resp. rate 16, height 5\' 4"  (1.626 m), weight 55.2 kg, SpO2 100 %.  Disposition: Discharge disposition: 01-Home or Self Care   Discharge Instructions    Diet - low sodium heart healthy   Complete by: As directed    Increase activity slowly   Complete by: As directed      Allergies as of 12/04/2018      Reactions   Aspirin Other (See Comments)   Stomach ulcer, now inactive, OK with low dose ASA      Medication List    STOP taking these medications   aspirin EC 325 MG tablet   benzonatate 100 MG capsule Commonly known as: Tessalon Perles   cetirizine 10 MG tablet Commonly known as: ZyrTEC Allergy   cyclobenzaprine 5 MG tablet Commonly known as: FLEXERIL   doxycycline 100 MG tablet Commonly known as: VIBRA-TABS   fluticasone 50 MCG/ACT nasal spray Commonly known as: FLONASE   hydrochlorothiazide 25 MG tablet Commonly known as: HYDRODIURIL   hydrocortisone cream 1 %   mometasone 50 MCG/ACT nasal spray Commonly known as: Nasonex   ondansetron 4 MG tablet Commonly known as: ZOFRAN   oseltamivir 75 MG capsule Commonly known as: TAMIFLU   ranitidine 150 MG tablet Commonly known as: ZANTAC   spironolactone 25 MG tablet Commonly known as: ALDACTONE     TAKE these medications   carvedilol 3.125 MG tablet Commonly  known as: COREG Take 1 tablet (3.125 mg total) by mouth 2 (two) times daily with a meal.   diazepam 10 MG tablet Commonly known as: VALIUM TAKE 1 TABLET BY MOUTH EVERY 12 HOURS AS NEEDED FOR ANXIETY What changed:   how much to take  how to take this  when to take this  reasons to take this   diclofenac sodium 1 % Gel Commonly known as: VOLTAREN APPLY 2 G TOPICALLY 4 (FOUR) TIMES DAILY. What changed:   how much to take  how to take this  when to take this  reasons to take this  additional instructions   estrogen (conjugated)-medroxyprogesterone 0.3-1.5 MG tablet Commonly known as: Prempro Take 1 tablet by mouth daily. What changed: when to take this   oxyCODONE 5 MG immediate release tablet Commonly known as: Oxy IR/ROXICODONE Take 1-2 tablets (5-10 mg total) by mouth every 4 (four) hours as needed for moderate pain.   polyethylene glycol 17 g packet Commonly known as: MiraLax Take 17 g by mouth daily. For constipation   temazepam 15 MG capsule Commonly known as: RESTORIL 1-2 po qhs prn What changed:   how much to take  how to take this  when to take this  reasons to take this   traZODone 50 MG tablet Commonly known as: DESYREL Take 1 tablet (50 mg total) by mouth at bedtime.      Follow-up Information    Surgery, [place]. Call on 12/23/2018.   Specialty: General Surgery Why: Follow up appointment scheduled for 3:00 PM. A provider will call you  during scheduled appointment time. Please send a photo of incisions with name and DOB to [email or web address] the day prior to appointment.  Contact information: [address] [place] [address] [phone]           Signed: Bonnell Public 12/04/2018, 1:18 PM

## 2018-12-04 NOTE — Progress Notes (Signed)
Brandi [name] to be D/C'd per MD order. Discussed with the patient and all questions fully answered. ? VSS, Skin clean, dry and intact without evidence of skin break down, no evidence of skin tears noted. ? IV catheter discontinued intact. Site without signs and symptoms of complications. Dressing and pressure applied. ? An After Visit Summary was printed and given to the patient. Patient informed where to pickup prescriptions. ? D/c education completed with patient/family including follow up instructions, medication list, d/c activities limitations if indicated, with other d/c instructions as indicated by MD - patient able to verbalize understanding, all questions fully answered.  ? Patient instructed to return to ED, call 911, or call MD for any changes in condition.  ? Patient to be escorted via [place], and D/C home via private auto.

## 2018-12-07 DIAGNOSIS — I119 Hypertensive heart disease without heart failure: Secondary | ICD-10-CM | POA: Diagnosis not present

## 2018-12-07 DIAGNOSIS — K210 Gastro-esophageal reflux disease with esophagitis: Secondary | ICD-10-CM | POA: Diagnosis not present

## 2018-12-07 DIAGNOSIS — F419 Anxiety disorder, unspecified: Secondary | ICD-10-CM | POA: Diagnosis not present

## 2018-12-07 DIAGNOSIS — K625 Hemorrhage of anus and rectum: Secondary | ICD-10-CM | POA: Diagnosis not present

## 2018-12-07 DIAGNOSIS — Z79899 Other long term (current) drug therapy: Secondary | ICD-10-CM | POA: Diagnosis not present

## 2018-12-07 DIAGNOSIS — E7800 Pure hypercholesterolemia, unspecified: Secondary | ICD-10-CM | POA: Diagnosis not present

## 2018-12-07 DIAGNOSIS — D120 Benign neoplasm of cecum: Secondary | ICD-10-CM | POA: Diagnosis not present

## 2018-12-07 DIAGNOSIS — K449 Diaphragmatic hernia without obstruction or gangrene: Secondary | ICD-10-CM | POA: Diagnosis not present

## 2018-12-07 DIAGNOSIS — K2950 Unspecified chronic gastritis without bleeding: Secondary | ICD-10-CM | POA: Diagnosis not present

## 2018-12-07 DIAGNOSIS — G4700 Insomnia, unspecified: Secondary | ICD-10-CM | POA: Diagnosis not present

## 2018-12-07 DIAGNOSIS — J302 Other seasonal allergic rhinitis: Secondary | ICD-10-CM | POA: Diagnosis not present

## 2018-12-07 DIAGNOSIS — Z139 Encounter for screening, unspecified: Secondary | ICD-10-CM | POA: Diagnosis not present

## 2019-01-03 ENCOUNTER — Telehealth (HOSPITAL_COMMUNITY): Payer: Self-pay | Admitting: Emergency Medicine

## 2019-01-03 NOTE — Telephone Encounter (Signed)
Left message on voicemail with name and callback number Kaizley Aja RN Navigator Cardiac Imaging [facility] [phone] Office [phone] Cell  

## 2019-01-04 ENCOUNTER — Encounter (HOSPITAL_COMMUNITY): Payer: Self-pay

## 2019-01-04 ENCOUNTER — Other Ambulatory Visit: Payer: Self-pay

## 2019-01-04 ENCOUNTER — Ambulatory Visit (HOSPITAL_COMMUNITY)
Admission: RE | Admit: 2019-01-04 | Discharge: 2019-01-04 | Disposition: A | Discharge status: Home / Self Care | Payer: Medicare Other | Source: Ambulatory Visit | Attending: Student | Admitting: Student

## 2019-01-04 DIAGNOSIS — R778 Other specified abnormalities of plasma proteins: Secondary | ICD-10-CM

## 2019-01-04 DIAGNOSIS — M549 Dorsalgia, unspecified: Secondary | ICD-10-CM | POA: Diagnosis not present

## 2019-01-04 DIAGNOSIS — F329 Major depressive disorder, single episode, unspecified: Secondary | ICD-10-CM | POA: Diagnosis not present

## 2019-01-04 DIAGNOSIS — R7989 Other specified abnormal findings of blood chemistry: Secondary | ICD-10-CM | POA: Insufficient documentation

## 2019-01-04 DIAGNOSIS — N179 Acute kidney failure, unspecified: Secondary | ICD-10-CM | POA: Diagnosis not present

## 2019-01-04 DIAGNOSIS — R072 Precordial pain: Secondary | ICD-10-CM | POA: Insufficient documentation

## 2019-01-04 DIAGNOSIS — Z20828 Contact with and (suspected) exposure to other viral communicable diseases: Secondary | ICD-10-CM | POA: Diagnosis not present

## 2019-01-04 DIAGNOSIS — Z03818 Encounter for observation for suspected exposure to other biological agents ruled out: Secondary | ICD-10-CM | POA: Diagnosis not present

## 2019-01-04 DIAGNOSIS — F419 Anxiety disorder, unspecified: Secondary | ICD-10-CM | POA: Diagnosis not present

## 2019-01-04 DIAGNOSIS — E876 Hypokalemia: Secondary | ICD-10-CM | POA: Diagnosis not present

## 2019-01-04 DIAGNOSIS — I4581 Long QT syndrome: Secondary | ICD-10-CM | POA: Diagnosis not present

## 2019-01-04 LAB — POCT I-STAT CREATININE: Creatinine, Ser: 1.9 mg/dL — ABNORMAL HIGH (ref 0.44–1.00)

## 2019-01-04 NOTE — Progress Notes (Signed)
ISTAT Crea completed on patient, result 1.9mg /dL.  Notified Dr. Meda Coffee of elevated creatinine.  Dr. Meda Coffee stated to cancel the Cardiac CTA and for patient to follow up with PCP.  Notified patient of elevated creatinine and that test has been cancelled.  Answered all questions.  Notified patient to make an appointment with PCP this week.  PIV removed and dressing applied.  Pt discharged

## 2019-01-05 ENCOUNTER — Other Ambulatory Visit: Payer: Self-pay | Admitting: *Deleted

## 2019-01-05 DIAGNOSIS — N289 Disorder of kidney and ureter, unspecified: Secondary | ICD-10-CM

## 2019-01-05 DIAGNOSIS — Z79899 Other long term (current) drug therapy: Secondary | ICD-10-CM

## 2019-01-05 DIAGNOSIS — I10 Essential (primary) hypertension: Secondary | ICD-10-CM

## 2019-01-06 ENCOUNTER — Telehealth: Payer: Self-pay | Admitting: Student

## 2019-01-06 ENCOUNTER — Other Ambulatory Visit: Payer: Self-pay

## 2019-01-06 ENCOUNTER — Telehealth: Payer: Self-pay | Admitting: Physician Assistant

## 2019-01-06 ENCOUNTER — Inpatient Hospital Stay (HOSPITAL_COMMUNITY)
Admission: EM | Admit: 2019-01-06 | Discharge: 2019-01-09 | DRG: 641 | Disposition: A | Discharge status: Home / Self Care | Payer: Medicare Other | Attending: Internal Medicine | Admitting: Internal Medicine

## 2019-01-06 ENCOUNTER — Other Ambulatory Visit: Payer: Medicare Other | Admitting: *Deleted

## 2019-01-06 DIAGNOSIS — F419 Anxiety disorder, unspecified: Secondary | ICD-10-CM | POA: Diagnosis present

## 2019-01-06 DIAGNOSIS — E873 Alkalosis: Secondary | ICD-10-CM | POA: Diagnosis present

## 2019-01-06 DIAGNOSIS — N179 Acute kidney failure, unspecified: Secondary | ICD-10-CM | POA: Diagnosis not present

## 2019-01-06 DIAGNOSIS — I10 Essential (primary) hypertension: Secondary | ICD-10-CM | POA: Diagnosis not present

## 2019-01-06 DIAGNOSIS — G4700 Insomnia, unspecified: Secondary | ICD-10-CM | POA: Diagnosis present

## 2019-01-06 DIAGNOSIS — Z96643 Presence of artificial hip joint, bilateral: Secondary | ICD-10-CM | POA: Diagnosis present

## 2019-01-06 DIAGNOSIS — M549 Dorsalgia, unspecified: Secondary | ICD-10-CM | POA: Diagnosis present

## 2019-01-06 DIAGNOSIS — K219 Gastro-esophageal reflux disease without esophagitis: Secondary | ICD-10-CM | POA: Diagnosis present

## 2019-01-06 DIAGNOSIS — E876 Hypokalemia: Secondary | ICD-10-CM | POA: Diagnosis not present

## 2019-01-06 DIAGNOSIS — Z20828 Contact with and (suspected) exposure to other viral communicable diseases: Secondary | ICD-10-CM | POA: Diagnosis present

## 2019-01-06 DIAGNOSIS — N289 Disorder of kidney and ureter, unspecified: Secondary | ICD-10-CM | POA: Diagnosis not present

## 2019-01-06 DIAGNOSIS — Z8711 Personal history of peptic ulcer disease: Secondary | ICD-10-CM

## 2019-01-06 DIAGNOSIS — I4581 Long QT syndrome: Secondary | ICD-10-CM | POA: Diagnosis not present

## 2019-01-06 DIAGNOSIS — F329 Major depressive disorder, single episode, unspecified: Secondary | ICD-10-CM | POA: Diagnosis present

## 2019-01-06 DIAGNOSIS — Z800 Family history of malignant neoplasm of digestive organs: Secondary | ICD-10-CM

## 2019-01-06 DIAGNOSIS — M1990 Unspecified osteoarthritis, unspecified site: Secondary | ICD-10-CM | POA: Diagnosis present

## 2019-01-06 DIAGNOSIS — Z886 Allergy status to analgesic agent status: Secondary | ICD-10-CM

## 2019-01-06 DIAGNOSIS — G8929 Other chronic pain: Secondary | ICD-10-CM | POA: Diagnosis present

## 2019-01-06 DIAGNOSIS — Z03818 Encounter for observation for suspected exposure to other biological agents ruled out: Secondary | ICD-10-CM | POA: Diagnosis not present

## 2019-01-06 DIAGNOSIS — K449 Diaphragmatic hernia without obstruction or gangrene: Secondary | ICD-10-CM | POA: Diagnosis present

## 2019-01-06 DIAGNOSIS — Z981 Arthrodesis status: Secondary | ICD-10-CM

## 2019-01-06 LAB — BASIC METABOLIC PANEL
Anion gap: 14 (ref 5–15)
BUN: 17 mg/dL (ref 8–23)
CO2: 39 mmol/L — ABNORMAL HIGH (ref 22–32)
Calcium: 9.2 mg/dL (ref 8.9–10.3)
Chloride: 81 mmol/L — ABNORMAL LOW (ref 98–111)
Creatinine, Ser: 2.3 mg/dL — ABNORMAL HIGH (ref 0.44–1.00)
GFR calc Af Amer: 24 mL/min — ABNORMAL LOW (ref >60)
GFR calc non Af Amer: 21 mL/min — ABNORMAL LOW (ref >60)
Glucose, Bld: 97 mg/dL (ref 70–99)
Potassium: 2.3 mmol/L — CL (ref 3.5–5.1)
Sodium: 134 mmol/L — ABNORMAL LOW (ref 135–145)

## 2019-01-06 LAB — PHOSPHORUS: Phosphorus: 3.7 mg/dL (ref 2.5–4.6)

## 2019-01-06 LAB — CBC
HCT: 36.4 % (ref 36.0–46.0)
Hemoglobin: 12.2 g/dL (ref 12.0–15.0)
MCH: 31.6 pg (ref 26.0–34.0)
MCHC: 33.5 g/dL (ref 30.0–36.0)
MCV: 94.3 fL (ref 80.0–100.0)
Platelets: 319 10*3/uL (ref 150–400)
RBC: 3.86 MIL/uL — ABNORMAL LOW (ref 3.87–5.11)
RDW: 13.2 % (ref 11.5–15.5)
WBC: 7.2 10*3/uL (ref 4.0–10.5)
nRBC: 0 % (ref 0.0–0.2)

## 2019-01-06 LAB — MAGNESIUM: Magnesium: 2.5 mg/dL — ABNORMAL HIGH (ref 1.7–2.4)

## 2019-01-06 MED ORDER — POTASSIUM CHLORIDE 10 MEQ/100ML IV SOLN
10.0000 meq | INTRAVENOUS | Status: AC
  Administered 2019-01-06 – 2019-01-07 (×3): 10 meq via INTRAVENOUS
  Filled 2019-01-06 (×3): qty 100

## 2019-01-06 MED ORDER — SODIUM CHLORIDE 0.9 % IV BOLUS
1000.0000 mL | Freq: Once | INTRAVENOUS | Status: AC
  Administered 2019-01-06: 1000 mL via INTRAVENOUS

## 2019-01-06 NOTE — Telephone Encounter (Signed)
   Called patient to discuss BMET results from today. Renal function is continuing to worsen with creatinine of 2.02 and potassium low at 2.7. Patient not on any medications at home that could cause AKI. She was previously on HCTZ and Spironolactone but these were discontinued when she was discharged from the hospital on 12/04/2018.   Patient states she feels okay but is still very weak from her surgery (laparoscopic cholecystectomy) over 1 month ago.  Advised patient she needs to go to the ED for further evaluation/managment of AKI and hypokalemia. Patient voiced understanding and agreed to go to the hospital tonight. She said her husband could drive her.   Patient also asked me to fax lab results to PCP (Dr. Vincente Liberty), which I will do.   Darreld Mclean, PA-C 01/06/2019 5:30 PM

## 2019-01-06 NOTE — ED Notes (Signed)
IV team bedside. 

## 2019-01-06 NOTE — H&P (Signed)
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  Patient has never seen a nephrologist to work-up hypokalemia, specifically, to rule out Gettleman syndrome.  Low normal blood pressure and metabolic alkalosis is noted, with reported polyuria, polydipsia and consequent acute kidney injury.  Other past medical history includes documented hypertension, chronically prolonged QTc interval, GERD, depression, anxiety, GI bleeding, gastric ulcer and insomnia.  Patient was discharged from this hospital on 12/04/2018 after inpatient care for syncope, acute cholecystitis and laparoscopic cholecystectomy, hypokalemia and acute kidney injury.  Patient's diuretics, including Aldactone, may have been discontinued on prior hospitalization.  Patient was asked to follow with the primary care provider and cardiology team.  Patient's problem started about 2 to 3 days ago when she went for CT coronary angiogram that was not done due to hyperkalemia.  Patient was asked to follow-up with her cardiologist for repeat chemistry.  Patient was seen at the cardiologist's office today, and chemistry done revealed potassium of 2.7 and serum creatinine of 2.02.  Patient was advised to come to the hospital for further assessment and management.  As mentioned above, patient endorsed polyuria and polydipsia.  Patient has been having significant muscle ache and weakness.  No headache, no neck pain, no URI symptoms, no fever or chills, no chest pain, no nausea vomiting or other GI symptoms.  Patient be admitted for further assessment and management.    ED Course: On presentation to the hospital, blood pressure was noted to  be 122/79, temperature of 98.3, heart rate of 69 with respiratory rate of 16 and O2 sat of 100%.  Potassium was noted to be 2.30, with a BUN of 17 and serum creatinine of 2.3.  CO2 is 39.  Patient is being hydrated, and potassium is being repleted.  Hospitalist team has been asked to admit patient for further assessment and management.  Pertinent labs: BMP revealed sodium of 134, potassium of 2.3, chloride of 81, CO2 of 39, BUN of 17, serum creatinine of 2.3 would blood sugar of 97.  Magnesium is 2.5.  EKG: Independently reviewed.  QTc interval is 515 ms.  Review of Systems:  Negative for fever, visual changes, sore throat, rash, chest pain, SOB, dysuria, bleeding, n/v/abdominal pain.  Past Medical History:  Diagnosis Date  . Anxiety    takes Valium daily as needed  . Chronic back pain   . Chronic back pain   . Complication of anesthesia 1980s   "I stopped breathing on the table; I was gettin my wisdom teeth extracted"  . Constipation   . Depression    takes Lexapro daily  . Dysrhythmia    pt. reports that her heart skips sometimes   . GERD (gastroesophageal reflux disease)    takes Zantac daily  . History of blood transfusion    no abnormal reaction noted  . History of colon polyps    benign  . History of gastric ulcer   . History of GI bleed   . History of hiatal hernia   . History of stress test    done in [place]- done in  1992, told that it was normal.   . Hypertension   . Insomnia    takes Restoril and Trazodone  nightly as needed  . Joint pain   . Joint swelling   . Nocturia   . Osteoarthritis   . Peripheral edema    occasionally but never been on fluid pill    Past Surgical History:  Procedure Laterality Date  . BACK SURGERY    . BIOPSY  11/30/2018   Procedure: BIOPSY;  Surgeon: [name], MD;  Location: [facility];  Service: Gastroenterology;;  . CHOLECYSTECTOMY N/A 12/02/2018   Procedure: LAPAROSCOPIC CHOLECYSTECTOMY WITH INTRAOPERATIVE CHOLANGIOGRAM;  Surgeon:  Coralie Keens, MD;  Location: [facility];  Service: General;  Laterality: N/A;  . COLONOSCOPY    . COLONOSCOPY WITH PROPOFOL N/A 11/30/2018   Procedure: COLONOSCOPY WITH PROPOFOL;  Surgeon: [name], MD;  Location: [facility];  Service: Gastroenterology;  Laterality: N/A;  . ESOPHAGOGASTRODUODENOSCOPY N/A 06/16/2014   Procedure: ESOPHAGOGASTRODUODENOSCOPY (EGD);  Surgeon: Missy Sabins, MD;  [facility] ENDOSCOPY;  Service: Endoscopy;  Laterality: N/A;  . ESOPHAGOGASTRODUODENOSCOPY (EGD) WITH PROPOFOL N/A 11/30/2018   Procedure: ESOPHAGOGASTRODUODENOSCOPY (EGD) WITH PROPOFOL;  Surgeon: [name], MD;  Location: [facility];  Service: Gastroenterology;  Laterality: N/A;  . JOINT REPLACEMENT    . LUMBAR FUSION    . SHOULDER SURGERY Left   . TOE SURGERY Right    "big toe was coming off"  . TOE SURGERY Left    "bone spur"  . TOTAL HIP ARTHROPLASTY Left 02/26/2015   Procedure: TOTAL HIP ARTHROPLASTY ANTERIOR APPROACH;  Surgeon: Frederik Pear, MD;  Location: [facility];  Service: Orthopedics;  Laterality: Left;  . TOTAL HIP ARTHROPLASTY Right 03/21/2016   Procedure: TOTAL HIP ARTHROPLASTY ANTERIOR APPROACH;  Surgeon: Frederik Pear, MD;  Location: [facility];  Service: Orthopedics;  Laterality: Right;  . WISDOM TOOTH EXTRACTION  1980s     reports that she has never smoked. She has never used smokeless tobacco. She reports that she does not drink alcohol or use drugs.  Allergies  Allergen Reactions  . Aspirin Other (See Comments)    Stomach ulcer, now inactive, OK with low dose ASA    Family History  Problem Relation Age of Onset  . Cancer Mother        colon cancer  . Cancer Father        colon cancer  . Cancer Sister 12       died of colon cancer  . Heart disease Neg Hx      Prior to Admission medications   Medication Sig Start Date End Date Taking? Authorizing Provider  carvedilol (COREG) 3.125 MG tablet Take 1 tablet (3.125 mg total) by mouth 2 (two) times daily with a meal. 12/04/18    Bonnell Public, MD  diazepam (VALIUM) 10 MG tablet TAKE 1 TABLET BY MOUTH EVERY 12 HOURS AS NEEDED FOR ANXIETY Patient taking differently: Take 10 mg by mouth every 12 (twelve) hours as needed for anxiety. TAKE 1 TABLET BY MOUTH EVERY 12 HOURS AS NEEDED FOR ANXIETY 05/19/16   Roma Schanz R, DO  diclofenac sodium (VOLTAREN) 1 % GEL APPLY 2 G TOPICALLY 4 (FOUR) TIMES DAILY. Patient taking differently: Apply 2 g topically as needed (pain).  02/05/15   Ann Held, DO  estrogen, conjugated,-medroxyprogesterone (PREMPRO) 0.3-1.5 MG tablet Take 1 tablet by mouth daily. Patient taking differently: Take 1 tablet by mouth at bedtime.  09/05/15   Ann Held, DO  oxyCODONE (OXY IR/ROXICODONE) 5 MG immediate release tablet Take 1-2 tablets (5-10 mg total) by mouth every 4 (four) hours as needed for moderate  pain. 12/04/18   Armandina Gemma, MD  polyethylene glycol [facility]) packet Take 17 g by mouth daily. For constipation Patient not taking: Reported on 11/28/2018 07/09/16   Smiley Houseman, MD  temazepam (RESTORIL) 15 MG capsule 1-2 po qhs prn Patient taking differently: Take 15 mg by mouth at bedtime as needed for sleep. 1-2 po qhs prn 06/13/16   Carollee Herter, Alferd Apa, DO  traZODone (DESYREL) 50 MG tablet Take 1 tablet (50 mg total) by mouth at bedtime. 06/13/16   Ann Held, DO    Physical Exam: Vitals:   01/06/19 1914 01/06/19 2202  BP: 110/76 122/79  Pulse: 73 69  Resp: 16 16  Temp: 98.5 F (36.9 C) 98.2 F (36.8 C)  TempSrc: Oral Oral  SpO2: 99% 100%    Constitutional:  . Appears calm and comfortable Eyes:  . No pallor. No jaundice.  ENMT:  . external ears, nose appear normal Neck:  . Neck is supple. No JVD Respiratory:  . CTA bilaterally, no w/r/r.  . Respiratory effort normal. No retractions or accessory muscle use Cardiovascular:  . S1S2 . No LE extremity edema   Abdomen:  . Abdomen is soft and non tender. Organs are difficult to  assess. Neurologic:  . Awake and alert. . Moves all limbs.  Wt Readings from Last 3 Encounters:  12/04/18 55.2 kg  09/07/18 59 kg  07/09/16 59.4 kg    I have personally reviewed following labs and imaging studies  Labs on Admission:  CBC: Recent Labs  Lab 01/06/19 1926  WBC 7.2  HGB 12.2  HCT 36.4  MCV 94.3  PLT 929   Basic Metabolic Panel: Recent Labs  Lab 01/04/19 1511 01/06/19 0941 01/06/19 1926  NA  --  134 134*  K  --  2.7* 2.3*  CL  --  83* 81*  CO2  --  34* 39*  GLUCOSE  --  92 97  BUN  --  17 17  CREATININE 1.90* 2.02* 2.30*  CALCIUM  --  9.5 9.2   Liver Function Tests: No results for input(s): AST, ALT, ALKPHOS, BILITOT, PROT, ALBUMIN in the last 168 hours. No results for input(s): LIPASE, AMYLASE in the last 168 hours. No results for input(s): AMMONIA in the last 168 hours. Coagulation Profile: No results for input(s): INR, PROTIME in the last 168 hours. Cardiac Enzymes: No results for input(s): CKTOTAL, CKMB, CKMBINDEX, TROPONINI in the last 168 hours. BNP (last 3 results) No results for input(s): PROBNP in the last 8760 hours. HbA1C: No results for input(s): HGBA1C in the last 72 hours. CBG: No results for input(s): GLUCAP in the last 168 hours. Lipid Profile: No results for input(s): CHOL, HDL, LDLCALC, TRIG, CHOLHDL, LDLDIRECT in the last 72 hours. Thyroid Function Tests: No results for input(s): TSH, T4TOTAL, FREET4, T3FREE, THYROIDAB in the last 72 hours. Anemia Panel: No results for input(s): VITAMINB12, FOLATE, FERRITIN, TIBC, IRON, RETICCTPCT in the last 72 hours. Urine analysis:    Component Value Date/Time   COLORURINE YELLOW 03/23/2016 [place] 03/23/2016 1403   LABSPEC 1.020 03/23/2016 1403   PHURINE 7.0 03/23/2016 1403   GLUCOSEU NEGATIVE 03/23/2016 1403   HGBUR NEGATIVE 03/23/2016 1403   BILIRUBINUR neg 04/18/2016 1534   KETONESUR NEGATIVE 03/23/2016 1403   PROTEINUR neg 04/18/2016 1534   PROTEINUR  NEGATIVE 03/23/2016 1403   UROBILINOGEN 1.0 04/18/2016 1534   UROBILINOGEN 1.0 02/15/2015 1209   NITRITE neg 04/18/2016 1534   NITRITE NEGATIVE 03/23/2016 1403  LEUKOCYTESUR Trace (A) 04/18/2016 1534   Sepsis Labs: @LABRCNTIP (procalcitonin:4,lacticidven:4) )No results found for this or any previous visit (from the past 240 hour(s)).    Radiological Exams on Admission: No results found.  EKG: Independently reviewed.   Active Problems:   * No active hospital problems. 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 Hydrate patient. Urine chloride, magnesium and calcium Please consult nephrology  Consider restarting Aldactone when AKI resolves. Patient may also benefit from NSAIDs if hypokalemia secondary to Gitelman syndrome Further management will depend on hospital course.  Acute kidney injury: This may be volume related Hydrate patient Avoid nephrotoxic medications Dose all medications assuming GFR of 50 mils per minute Monitor renal function For further work-up if no significant improvement.  Prolonged QTc interval:  This may be related to electrolyte abnormality Repeat EKG in the morning.  DVT prophylaxis: Subcut heparin Code Status: Full Family Communication:  Disposition Plan: Home eventually Consults called: Please consult nephrology later this morning Admission status: Inpatient  Time spent: 65 minutes  Dana Allan, MD  [facility] Pager #: [phone] 7PM-7AM contact night coverage as above  01/06/2019, 11:42 PM

## 2019-01-06 NOTE — Telephone Encounter (Signed)
Critical lab result received - K 2.7, sCr 2.02.   She spoke with Sande Rives PAC earlier today who advised to go to the ER. She is on her way.   Ledora Bottcher, PA-C 01/06/2019, 6:03 PM [facility] [address] [place], [place] [address]

## 2019-01-06 NOTE — ED Provider Notes (Signed)
[facility] EMERGENCY DEPARTMENT Provider Note   CSN: [id number] Arrival date & time: 01/06/19  1847    History   Chief Complaint Chief Complaint  Patient presents with  . Abnormal Lab    HPI Brandi [name] is a 73 y.o. female presenting for evaluation of abnormal lab results.  Patient states she had a routine blood draw earlier this week, results came back today and showed that she was hypokalemic with an elevation in her creatinine.  She is told to come to the ER.  Patient states she frequently has issues with her potassium, but has never had any issues with her creatinine.  She admitted to the hospital last month during which she had cholecystectomy, her creatinine was normal upon discharge.  Patient states the past several years, she is having increasing feeling that she is not completely emptying her bladder when she urinates.  She denies fever, chills, cough, chest pain, shortness breath, nausea, vomiting, abdominal pain, abnormal bowel movements.  She denies generalized weakness or myalgias.  She denies medication changes.  She is not on the Lasix or fluid pills.  Per chart review, her Spironolactone was discontinued during her last hospitalization last month.  Patient states she has been eating and drinking well.  She drinks a lot of water throughout the day, no change in her fluid intake.  Patient patient has a history of cardiac abnormality, states her heartbeat is slow and it is often irregular.  Patient states several years ago she was told that she had a "on her kidneys, but that it was not concerning.  Additional history obtained from chart review.  Per CT performed June 2020, patient has a mid pole right renal cyst and a low-density lesion of the anterior midpole of the left kidney, likely a cyst but not fully characterized.     HPI  Past Medical History:  Diagnosis Date  . Anxiety    takes Valium daily as needed  . Chronic back pain   . Chronic back  pain   . Complication of anesthesia 1980s   "I stopped breathing on the table; I was gettin my wisdom teeth extracted"  . Constipation   . Depression    takes Lexapro daily  . Dysrhythmia    pt. reports that her heart skips sometimes   . GERD (gastroesophageal reflux disease)    takes Zantac daily  . History of blood transfusion    no abnormal reaction noted  . History of colon polyps    benign  . History of gastric ulcer   . History of GI bleed   . History of hiatal hernia   . History of stress test    done in [place]- done in  1992, told that it was normal.   . Hypertension   . Insomnia    takes Restoril and Trazodone nightly as needed  . Joint pain   . Joint swelling   . Nocturia   . Osteoarthritis   . Peripheral edema    occasionally but never been on fluid pill    Patient Active Problem List   Diagnosis Date Noted  . Acute cholecystitis   . Elevated LFTs   . Palpitations   . Elevated troponin   . Syncope 11/28/2018  . QT prolongation 11/28/2018  . Anxiety 11/28/2018  . Dark stools   . Arthritis of right hip 03/21/2016  . Primary osteoarthritis of right hip 03/20/2016  . Right ear impacted cerumen 03/12/2016  . Fall at  home 10/21/2015  . Leg pain 09/27/2015  . Fatigue 04/22/2015  . Primary osteoarthritis of left hip 02/25/2015  . Pre-op evaluation 02/22/2015  . Sinusitis, chronic 01/30/2015  . Insomnia 01/30/2015  . Lymphadenopathy 11/24/2014  . Hypokalemia 06/15/2014  . Rectal bleeding 06/15/2014  . AKI (acute kidney injury) ([place]) 06/15/2014  . Essential hypertension, benign 04/10/2014  . Tachycardia 04/10/2014  . GERD (gastroesophageal reflux disease) 11/08/2013  . Chronic constipation 11/08/2013    Past Surgical History:  Procedure Laterality Date  . BACK SURGERY    . BIOPSY  11/30/2018   Procedure: BIOPSY;  Surgeon: [name], MD;  Location: [place];  Service: Gastroenterology;;  . CHOLECYSTECTOMY N/A 12/02/2018   Procedure: LAPAROSCOPIC  CHOLECYSTECTOMY WITH INTRAOPERATIVE CHOLANGIOGRAM;  Surgeon: Coralie Keens, MD;  Location: [place];  Service: General;  Laterality: N/A;  . COLONOSCOPY    . COLONOSCOPY WITH PROPOFOL N/A 11/30/2018   Procedure: COLONOSCOPY WITH PROPOFOL;  Surgeon: [name], MD;  Location: [place];  Service: Gastroenterology;  Laterality: N/A;  . ESOPHAGOGASTRODUODENOSCOPY N/A 06/16/2014   Procedure: ESOPHAGOGASTRODUODENOSCOPY (EGD);  Surgeon: Missy Sabins, MD;  Location: [facility] ENDOSCOPY;  Service: Endoscopy;  Laterality: N/A;  . ESOPHAGOGASTRODUODENOSCOPY (EGD) WITH PROPOFOL N/A 11/30/2018   Procedure: ESOPHAGOGASTRODUODENOSCOPY (EGD) WITH PROPOFOL;  Surgeon: [name], MD;  Location: [place];  Service: Gastroenterology;  Laterality: N/A;  . JOINT REPLACEMENT    . LUMBAR FUSION    . SHOULDER SURGERY Left   . TOE SURGERY Right    "big toe was coming off"  . TOE SURGERY Left    "bone spur"  . TOTAL HIP ARTHROPLASTY Left 02/26/2015   Procedure: TOTAL HIP ARTHROPLASTY ANTERIOR APPROACH;  Surgeon: Frederik Pear, MD;  Location: [place];  Service: Orthopedics;  Laterality: Left;  . TOTAL HIP ARTHROPLASTY Right 03/21/2016   Procedure: TOTAL HIP ARTHROPLASTY ANTERIOR APPROACH;  Surgeon: Frederik Pear, MD;  Location: [place];  Service: Orthopedics;  Laterality: Right;  . WISDOM TOOTH EXTRACTION  1980s     OB History   No obstetric history on file.      Home Medications    Prior to Admission medications   Medication Sig Start Date End Date Taking? Authorizing Provider  carvedilol (COREG) 3.125 MG tablet Take 1 tablet (3.125 mg total) by mouth 2 (two) times daily with a meal. 12/04/18   Bonnell Public, MD  diazepam (VALIUM) 10 MG tablet TAKE 1 TABLET BY MOUTH EVERY 12 HOURS AS NEEDED FOR ANXIETY Patient taking differently: Take 10 mg by mouth every 12 (twelve) hours as needed for anxiety. TAKE 1 TABLET BY MOUTH EVERY 12 HOURS AS NEEDED FOR ANXIETY 05/19/16   Roma Schanz R, DO  diclofenac sodium  (VOLTAREN) 1 % GEL APPLY 2 G TOPICALLY 4 (FOUR) TIMES DAILY. Patient taking differently: Apply 2 g topically as needed (pain).  02/05/15   Ann Held, DO  estrogen, conjugated,-medroxyprogesterone (PREMPRO) 0.3-1.5 MG tablet Take 1 tablet by mouth daily. Patient taking differently: Take 1 tablet by mouth at bedtime.  09/05/15   Roma Schanz R, DO  oxyCODONE (OXY IR/ROXICODONE) 5 MG immediate release tablet Take 1-2 tablets (5-10 mg total) by mouth every 4 (four) hours as needed for moderate pain. 12/04/18   Armandina Gemma, MD  polyethylene glycol [facility]) packet Take 17 g by mouth daily. For constipation Patient not taking: Reported on 11/28/2018 07/09/16   Smiley Houseman, MD  temazepam (RESTORIL) 15 MG capsule 1-2 po qhs prn Patient taking differently: Take 15 mg  by mouth at bedtime as needed for sleep. 1-2 po qhs prn 06/13/16   Carollee Herter, Alferd Apa, DO  traZODone (DESYREL) 50 MG tablet Take 1 tablet (50 mg total) by mouth at bedtime. 06/13/16   Ann Held, DO    Family History Family History  Problem Relation Age of Onset  . Cancer Mother        colon cancer  . Cancer Father        colon cancer  . Cancer Sister 83       died of colon cancer  . Heart disease Neg Hx     Social History Social History   Tobacco Use  . Smoking status: Never Smoker  . Smokeless tobacco: Never Used  Substance Use Topics  . Alcohol use: No  . Drug use: No     Allergies   Aspirin   Review of Systems Review of Systems  All other systems reviewed and are negative.    Physical Exam Updated Vital Signs BP 122/79 (BP Location: Left Arm)   Pulse 69   Temp 98.2 F (36.8 C) (Oral)   Resp 16   SpO2 100%   Physical Exam Vitals signs and nursing note reviewed.  Constitutional:      General: She is not in acute distress.    Appearance: She is well-developed.     Comments: Resting comfortably in the bed in no acute distress  HENT:     Head: Normocephalic and  atraumatic.  Eyes:     Conjunctiva/sclera: Conjunctivae normal.     Pupils: Pupils are equal, round, and reactive to light.  Neck:     Musculoskeletal: Normal range of motion and neck supple.  Cardiovascular:     Rate and Rhythm: Normal rate and regular rhythm.     Pulses: Normal pulses.  Pulmonary:     Effort: Pulmonary effort is normal. No respiratory distress.     Breath sounds: Normal breath sounds. No wheezing.  Abdominal:     General: There is no distension.     Palpations: Abdomen is soft. There is no mass.     Tenderness: There is no abdominal tenderness. There is no right CVA tenderness, left CVA tenderness, guarding or rebound.     Comments: No tenderness palpation the abdomen.  Soft without rigidity, guarding, distention.  Negative rebound.  No CVA tenderness.  Musculoskeletal: Normal range of motion.  Skin:    General: Skin is warm and dry.     Capillary Refill: Capillary refill takes less than 2 seconds.  Neurological:     Mental Status: She is alert and oriented to person, place, and time.      ED Treatments / Results  Labs (all labs ordered are listed, but only abnormal results are displayed) Labs Reviewed  CBC - Abnormal; Notable for the following components:      Result Value   RBC 3.86 (*)    All other components within normal limits  BASIC METABOLIC PANEL - Abnormal; Notable for the following components:   Sodium 134 (*)    Potassium 2.3 (*)    Chloride 81 (*)    CO2 39 (*)    Creatinine, Ser 2.30 (*)    GFR calc non Af Amer 21 (*)    GFR calc Af Amer 24 (*)    All other components within normal limits  SARS CORONAVIRUS 2 (HOSPITAL ORDER, [place] [facility])  URINE CULTURE  URINALYSIS, ROUTINE W REFLEX MICROSCOPIC  MAGNESIUM  PHOSPHORUS    EKG EKG Interpretation  Date/Time:  Thursday January 06 2019 19:06:49 EDT Ventricular Rate:  70 PR Interval:  184 QRS Duration: 68 QT Interval:  470 QTC Calculation: 507 R Axis:   47  Text Interpretation:  Normal sinus rhythm Prolonged QT which has been present on multiple previous EKGs Abnormal ECG Confirmed by Pryor Curia [phone]) on 01/06/2019 11:10:22 PM   Radiology No results found.  Procedures .Critical Care Performed by: Franchot Heidelberg, PA-C Authorized by: Franchot Heidelberg, PA-C   Critical care provider statement:    Critical care time (minutes):  35   Critical care time was exclusive of:  Teaching time and separately billable procedures and treating other patients   Critical care was necessary to treat or prevent imminent or life-threatening deterioration of the following conditions:  Metabolic crisis   Critical care was time spent personally by me on the following activities:  Blood draw for specimens, development of treatment plan with patient or surrogate, evaluation of patient's response to treatment, obtaining history from patient or surrogate, examination of patient, ordering and review of laboratory studies, ordering and performing treatments and interventions, ordering and review of radiographic studies, pulse oximetry, re-evaluation of patient's condition and review of old charts   I assumed direction of critical care for this patient from another provider in my specialty: no   Comments:     Patient with critically low potassium at 2.3.  AKI with doubling in creatinine since last. Pt given multiple runs of potassium and admitted.    (including critical care time)  Medications Ordered in ED Medications  potassium chloride 10 mEq in 100 mL IVPB (has no administration in time range)  sodium chloride 0.9 % bolus 1,000 mL (has no administration in time range)     Initial Impression / Assessment and Plan / ED Course  I have reviewed the triage vital signs and the nursing notes.  Pertinent labs & imaging results that were available during my care of the patient were reviewed by me and considered in my medical decision making (see chart for details).         Patient presenting for evaluation of hyperkalemia and AKI.  On exam, patient is well-appearing.  Labs are concerning and that her potassium is 2.3.  EKG shows prolonged QT without obvious U waves.  Distally, patient's creatinine has doubled from 1 month ago to 2.3.  Will obtain mag, phosphorus, urine, urine culture, and post void bladder scan.  Will start potassium IV.  Patient will need to be admitted for her hyperkalemia and AKI.  Post void bladder scan 48.  Case discussed with attending, Dr. Leonides Schanz evaluated patient.  Patient dated that she has been taking ibuprofen every day since February, this may be the cause of her AKI.  Will call for admission for hypokalemia and AKI.  Discussed with Dr. Marthenia Rolling from [facility], pt to be admitted.   Final Clinical Impressions(s) / ED Diagnoses   Final diagnoses:  Hypokalemia  AKI (acute kidney injury) [facility])    ED Discharge Orders    None       Franchot Heidelberg, PA-C 01/06/19 2341    Ward, Delice Bison, DO 01/07/19 [phone]

## 2019-01-06 NOTE — ED Triage Notes (Signed)
Pt reports she was told her potassium was low and her Crt was high. Pt is not a dialysis pt. She denies CP, N/V, or any other complaints. She reports she has been drinking plenty of water.

## 2019-01-06 NOTE — ED Notes (Signed)
Pt called and updated spouse.

## 2019-01-06 NOTE — ED Notes (Signed)
Provider bedside.

## 2019-01-07 ENCOUNTER — Telehealth: Payer: Self-pay | Admitting: Cardiology

## 2019-01-07 DIAGNOSIS — N179 Acute kidney failure, unspecified: Secondary | ICD-10-CM

## 2019-01-07 DIAGNOSIS — Z8711 Personal history of peptic ulcer disease: Secondary | ICD-10-CM | POA: Diagnosis not present

## 2019-01-07 DIAGNOSIS — F329 Major depressive disorder, single episode, unspecified: Secondary | ICD-10-CM | POA: Diagnosis not present

## 2019-01-07 DIAGNOSIS — M549 Dorsalgia, unspecified: Secondary | ICD-10-CM | POA: Diagnosis not present

## 2019-01-07 DIAGNOSIS — Z96643 Presence of artificial hip joint, bilateral: Secondary | ICD-10-CM | POA: Diagnosis present

## 2019-01-07 DIAGNOSIS — E873 Alkalosis: Secondary | ICD-10-CM | POA: Diagnosis present

## 2019-01-07 DIAGNOSIS — F419 Anxiety disorder, unspecified: Secondary | ICD-10-CM | POA: Diagnosis not present

## 2019-01-07 DIAGNOSIS — Z20828 Contact with and (suspected) exposure to other viral communicable diseases: Secondary | ICD-10-CM | POA: Diagnosis not present

## 2019-01-07 DIAGNOSIS — G4700 Insomnia, unspecified: Secondary | ICD-10-CM | POA: Diagnosis not present

## 2019-01-07 DIAGNOSIS — K449 Diaphragmatic hernia without obstruction or gangrene: Secondary | ICD-10-CM | POA: Diagnosis not present

## 2019-01-07 DIAGNOSIS — G8929 Other chronic pain: Secondary | ICD-10-CM | POA: Diagnosis not present

## 2019-01-07 DIAGNOSIS — M1990 Unspecified osteoarthritis, unspecified site: Secondary | ICD-10-CM | POA: Diagnosis not present

## 2019-01-07 DIAGNOSIS — E876 Hypokalemia: Secondary | ICD-10-CM | POA: Diagnosis not present

## 2019-01-07 DIAGNOSIS — Z886 Allergy status to analgesic agent status: Secondary | ICD-10-CM | POA: Diagnosis not present

## 2019-01-07 DIAGNOSIS — Z981 Arthrodesis status: Secondary | ICD-10-CM | POA: Diagnosis not present

## 2019-01-07 DIAGNOSIS — I10 Essential (primary) hypertension: Secondary | ICD-10-CM | POA: Diagnosis not present

## 2019-01-07 DIAGNOSIS — Z800 Family history of malignant neoplasm of digestive organs: Secondary | ICD-10-CM | POA: Diagnosis not present

## 2019-01-07 DIAGNOSIS — K219 Gastro-esophageal reflux disease without esophagitis: Secondary | ICD-10-CM | POA: Diagnosis not present

## 2019-01-07 LAB — URINALYSIS, COMPLETE (UACMP) WITH MICROSCOPIC
Bilirubin Urine: NEGATIVE
Glucose, UA: NEGATIVE mg/dL
Hgb urine dipstick: NEGATIVE
Ketones, ur: NEGATIVE mg/dL
Leukocytes,Ua: NEGATIVE
Nitrite: NEGATIVE
Protein, ur: NEGATIVE mg/dL
Specific Gravity, Urine: 1.008 (ref 1.005–1.030)
pH: 8 (ref 5.0–8.0)

## 2019-01-07 LAB — BASIC METABOLIC PANEL
Anion gap: 12 (ref 5–15)
BUN/Creatinine Ratio: 8 — ABNORMAL LOW (ref 12–28)
BUN: 17 mg/dL (ref 8–27)
BUN: 13 mg/dL (ref 8–23)
CO2: 34 mmol/L — ABNORMAL HIGH (ref 20–29)
CO2: 33 mmol/L — ABNORMAL HIGH (ref 22–32)
Calcium: 9.5 mg/dL (ref 8.7–10.3)
Calcium: 8.7 mg/dL — ABNORMAL LOW (ref 8.9–10.3)
Chloride: 83 mmol/L — ABNORMAL LOW (ref 96–106)
Chloride: 92 mmol/L — ABNORMAL LOW (ref 98–111)
Creatinine, Ser: 2.02 mg/dL — ABNORMAL HIGH (ref 0.57–1.00)
Creatinine, Ser: 1.89 mg/dL — ABNORMAL HIGH (ref 0.44–1.00)
GFR calc Af Amer: 28 mL/min/{1.73_m2} — ABNORMAL LOW (ref >59)
GFR calc Af Amer: 30 mL/min — ABNORMAL LOW (ref >60)
GFR calc non Af Amer: 24 mL/min/{1.73_m2} — ABNORMAL LOW (ref >59)
GFR calc non Af Amer: 26 mL/min — ABNORMAL LOW (ref >60)
Glucose, Bld: 93 mg/dL (ref 70–99)
Glucose: 92 mg/dL (ref 65–99)
Potassium: 2.7 mmol/L — CL (ref 3.5–5.2)
Potassium: 2.9 mmol/L — ABNORMAL LOW (ref 3.5–5.1)
Sodium: 134 mmol/L (ref 134–144)
Sodium: 137 mmol/L (ref 135–145)

## 2019-01-07 LAB — CBC
HCT: 33.6 % — ABNORMAL LOW (ref 36.0–46.0)
Hemoglobin: 11.1 g/dL — ABNORMAL LOW (ref 12.0–15.0)
MCH: 31.6 pg (ref 26.0–34.0)
MCHC: 33 g/dL (ref 30.0–36.0)
MCV: 95.7 fL (ref 80.0–100.0)
Platelets: 276 10*3/uL (ref 150–400)
RBC: 3.51 MIL/uL — ABNORMAL LOW (ref 3.87–5.11)
RDW: 13.3 % (ref 11.5–15.5)
WBC: 8.6 10*3/uL (ref 4.0–10.5)
nRBC: 0 % (ref 0.0–0.2)

## 2019-01-07 LAB — SODIUM, URINE, RANDOM: Sodium, Ur: 49 mmol/L

## 2019-01-07 LAB — CHLORIDE, URINE, RANDOM: Chloride Urine: <15 mmol/L

## 2019-01-07 LAB — PROTEIN, URINE, RANDOM: Total Protein, Urine: 13 mg/dL

## 2019-01-07 LAB — TSH: TSH: 3.126 u[IU]/mL (ref 0.350–4.500)

## 2019-01-07 LAB — SARS CORONAVIRUS 2 BY RT PCR (HOSPITAL ORDER, PERFORMED IN ~~LOC~~ HOSPITAL LAB): SARS Coronavirus 2: NEGATIVE

## 2019-01-07 MED ORDER — POTASSIUM CHLORIDE CRYS ER 20 MEQ PO TBCR
40.0000 meq | EXTENDED_RELEASE_TABLET | Freq: Once | ORAL | Status: AC
  Administered 2019-01-07: 40 meq via ORAL
  Filled 2019-01-07: qty 2

## 2019-01-07 MED ORDER — POTASSIUM CHLORIDE IN NACL 20-0.9 MEQ/L-% IV SOLN
INTRAVENOUS | Status: DC
  Administered 2019-01-07 (×3): via INTRAVENOUS
  Filled 2019-01-07 (×4): qty 1000

## 2019-01-07 MED ORDER — CONJ ESTROG-MEDROXYPROGEST ACE 0.3-1.5 MG PO TABS
1.0000 | ORAL_TABLET | Freq: Every day | ORAL | Status: DC
  Administered 2019-01-07 – 2019-01-08 (×3): 1 via ORAL

## 2019-01-07 MED ORDER — HEPARIN SODIUM (PORCINE) 5000 UNIT/ML IJ SOLN
5000.0000 [IU] | Freq: Three times a day (TID) | INTRAMUSCULAR | Status: DC
  Administered 2019-01-07 – 2019-01-09 (×7): 5000 [IU] via SUBCUTANEOUS
  Filled 2019-01-07 (×7): qty 1

## 2019-01-07 MED ORDER — TEMAZEPAM 15 MG PO CAPS
15.0000 mg | ORAL_CAPSULE | Freq: Every evening | ORAL | Status: DC | PRN
  Administered 2019-01-07 (×2): 15 mg via ORAL
  Filled 2019-01-07 (×2): qty 1

## 2019-01-07 MED ORDER — ALUM & MAG HYDROXIDE-SIMETH 200-200-20 MG/5ML PO SUSP
15.0000 mL | ORAL | Status: DC | PRN
  Administered 2019-01-07 – 2019-01-08 (×4): 15 mL via ORAL
  Filled 2019-01-07 (×4): qty 30

## 2019-01-07 MED ORDER — DIAZEPAM 5 MG PO TABS: 10.0000 mg | ORAL_TABLET | Freq: Two times a day (BID) | ORAL | Status: DC | PRN

## 2019-01-07 MED ORDER — POTASSIUM CHLORIDE CRYS ER 20 MEQ PO TBCR
40.0000 meq | EXTENDED_RELEASE_TABLET | Freq: Two times a day (BID) | ORAL | Status: AC
  Administered 2019-01-07 (×2): 40 meq via ORAL
  Filled 2019-01-07 (×2): qty 2

## 2019-01-07 MED ORDER — OXYCODONE HCL 5 MG PO TABS
5.0000 mg | ORAL_TABLET | ORAL | Status: DC | PRN
  Administered 2019-01-07 – 2019-01-08 (×7): 10 mg via ORAL
  Filled 2019-01-07 (×4): qty 2
  Filled 2019-01-07: qty 1
  Filled 2019-01-07 (×3): qty 2

## 2019-01-07 NOTE — Consult Note (Signed)
Brandi [name] Admit Date: 01/06/2019 01/07/2019 Rexene Agent Requesting Physician:  Eligah East   Reason for Consult:  Hypokalemia, Alkalosis, AKI HPI:  73 year old female admitted overnight after outpatient labs identified hyperkalemia, AKI, metabolic alkalosis.  PMH Incudes:  Recent admission for syncope and acute cholecystitis with laparoscopic cholecystectomy  GERD, history of peptic ulcers, hiatal hernia, chronic discomfort  Prolonged QTC  Depression/anxiety  Patient has had longstanding issues with hypokalemia and metabolic alkalosis.  Review of the available labs demonstrates hypokalemia dating as far back as 2015, always accompanied by normal or elevated serum bicarbonate.  She appears to have baseline normal renal function but was admitted with a creatinine of 2.0.  She has a tendency towards hypocalcemia.  She is only that she stopped taking her potassium chloride pills prior to the admitting blood work because she thought she was doing well.  Blood pressure reviews and [place] link dating back several years suggest she is normally normotensive.  Seldom she hyper- or hypo-tensive.  No peripheral edema.  She denies cramps.  She states that she drinks water all day long and has some nocturia.  Couple times a week she is stiff on the stomach having several episodes of emesis.  No family history of hypokalemia, renal disease.  She denies any presence of this in childhood or early adulthood.  No diuretics are on her MAR.  Urine chemistries here include a urine chloride of less than 15.   Creat (mg/dL)  Date Value  04/18/2016 1.01 (H)  06/14/2013 0.90   Creatinine, Ser (mg/dL)  Date Value  01/07/2019 1.89 (H)  01/06/2019 2.30 (H)  01/06/2019 2.02 (H)  01/04/2019 1.90 (H)  12/03/2018 1.12 (H)  12/02/2018 1.05 (H)  12/01/2018 0.97  11/30/2018 0.89  11/29/2018 0.95  11/29/2018 1.04 (H)  ] I/Os: I/O last 3 completed shifts: In: -  Out: 150 [Urine:150]    ROS Balance of 12 systems is negative w/ exceptions as above  PMH  Past Medical History:  Diagnosis Date  . Anxiety    takes Valium daily as needed  . Chronic back pain   . Chronic back pain   . Complication of anesthesia 1980s   "I stopped breathing on the table; I was gettin my wisdom teeth extracted"  . Constipation   . Depression    takes Lexapro daily  . Dysrhythmia    pt. reports that her heart skips sometimes   . GERD (gastroesophageal reflux disease)    takes Zantac daily  . History of blood transfusion    no abnormal reaction noted  . History of colon polyps    benign  . History of gastric ulcer   . History of GI bleed   . History of hiatal hernia   . History of stress test    done in [place]- done in  1992, told that it was normal.   . Hypertension   . Insomnia    takes Restoril and Trazodone nightly as needed  . Joint pain   . Joint swelling   . Nocturia   . Osteoarthritis   . Peripheral edema    occasionally but never been on fluid pill   PSH  Past Surgical History:  Procedure Laterality Date  . BACK SURGERY    . BIOPSY  11/30/2018   Procedure: BIOPSY;  Surgeon: [name], MD;  Location: [place];  Service: Gastroenterology;;  . CHOLECYSTECTOMY N/A 12/02/2018   Procedure: LAPAROSCOPIC CHOLECYSTECTOMY WITH INTRAOPERATIVE CHOLANGIOGRAM;  Surgeon: Coralie Keens, MD;  Location: [facility];  Service: General;  Laterality: N/A;  . COLONOSCOPY    . COLONOSCOPY WITH PROPOFOL N/A 11/30/2018   Procedure: COLONOSCOPY WITH PROPOFOL;  Surgeon: [name], MD;  Location: [place];  Service: Gastroenterology;  Laterality: N/A;  . ESOPHAGOGASTRODUODENOSCOPY N/A 06/16/2014   Procedure: ESOPHAGOGASTRODUODENOSCOPY (EGD);  Surgeon: Missy Sabins, MD;  Location: [facility] ENDOSCOPY;  Service: Endoscopy;  Laterality: N/A;  . ESOPHAGOGASTRODUODENOSCOPY (EGD) WITH PROPOFOL N/A 11/30/2018   Procedure: ESOPHAGOGASTRODUODENOSCOPY (EGD) WITH PROPOFOL;  Surgeon: [name], MD;   Location: [place];  Service: Gastroenterology;  Laterality: N/A;  . JOINT REPLACEMENT    . LUMBAR FUSION    . SHOULDER SURGERY Left   . TOE SURGERY Right    "big toe was coming off"  . TOE SURGERY Left    "bone spur"  . TOTAL HIP ARTHROPLASTY Left 02/26/2015   Procedure: TOTAL HIP ARTHROPLASTY ANTERIOR APPROACH;  Surgeon: Frederik Pear, MD;  Location: [place];  Service: Orthopedics;  Laterality: Left;  . TOTAL HIP ARTHROPLASTY Right 03/21/2016   Procedure: TOTAL HIP ARTHROPLASTY ANTERIOR APPROACH;  Surgeon: Frederik Pear, MD;  Location: [place];  Service: Orthopedics;  Laterality: Right;  . WISDOM TOOTH EXTRACTION  26s   FH  Family History  Problem Relation Age of Onset  . Cancer Mother        colon cancer  . Cancer Father        colon cancer  . Cancer Sister 5       died of colon cancer  . Heart disease Neg Hx    SH  reports that she has never smoked. She has never used smokeless tobacco. She reports that she does not drink alcohol or use drugs. Allergies  Allergies  Allergen Reactions  . Aspirin Other (See Comments)    Stomach ulcer, now inactive, OK with low dose ASA   Home medications Prior to Admission medications   Medication Sig Start Date End Date Taking? Authorizing Provider  diazepam (VALIUM) 10 MG tablet TAKE 1 TABLET BY MOUTH EVERY 12 HOURS AS NEEDED FOR ANXIETY Patient taking differently: Take 10 mg by mouth every 12 (twelve) hours as needed for anxiety. TAKE 1 TABLET BY MOUTH EVERY 12 HOURS AS NEEDED FOR ANXIETY 05/19/16  Yes Roma Schanz R, DO  diclofenac sodium (VOLTAREN) 1 % GEL APPLY 2 G TOPICALLY 4 (FOUR) TIMES DAILY. Patient taking differently: Apply 2 g topically as needed (pain).  02/05/15  Yes Roma Schanz R, DO  estrogen, conjugated,-medroxyprogesterone (PREMPRO) 0.3-1.5 MG tablet Take 1 tablet by mouth daily. Patient taking differently: Take 1 tablet by mouth at bedtime.  09/05/15  Yes Roma Schanz R, DO  oxyCODONE (OXY IR/ROXICODONE)  5 MG immediate release tablet Take 1-2 tablets (5-10 mg total) by mouth every 4 (four) hours as needed for moderate pain. 12/04/18  Yes Armandina Gemma, MD  temazepam (RESTORIL) 15 MG capsule 1-2 po qhs prn 06/13/16  Yes Lowne Lyndal Pulley R, DO  carvedilol (COREG) 3.125 MG tablet Take 1 tablet (3.125 mg total) by mouth 2 (two) times daily with a meal. Patient not taking: Reported on 01/06/2019 12/04/18   Dana Allan I, MD  polyethylene glycol [facility]) packet Take 17 g by mouth daily. For constipation Patient not taking: Reported on 11/28/2018 07/09/16   Smiley Houseman, MD  traZODone (DESYREL) 50 MG tablet Take 1 tablet (50 mg total) by mouth at bedtime. Patient not taking: Reported on 01/06/2019 06/13/16   Ann Held, DO  Current Medications Scheduled Meds: . estrogen (conjugated)-medroxyprogesterone  1 tablet Oral QHS  . heparin  5,000 Units Subcutaneous Q8H  . potassium chloride  40 mEq Oral BID   Continuous Infusions: . 0.9 % NaCl with KCl 20 mEq / L 100 mL/hr at 01/07/19 1229   PRN Meds:.alum & mag hydroxide-simeth, diazepam, oxyCODONE, temazepam  CBC Recent Labs  Lab 01/06/19 1926 01/07/19 0508  WBC 7.2 8.6  HGB 12.2 11.1*  HCT 36.4 33.6*  MCV 94.3 95.7  PLT 319 817   Basic Metabolic Panel Recent Labs  Lab 01/04/19 1511 01/06/19 0941 01/06/19 1926 01/07/19 0508  NA  --  134 134* 137  K  --  2.7* 2.3* 2.9*  CL  --  83* 81* 92*  CO2  --  34* 39* 33*  GLUCOSE  --  92 97 93  BUN  --  17 17 13   CREATININE 1.90* 2.02* 2.30* 1.89*  CALCIUM  --  9.5 9.2 8.7*  PHOS  --   --  3.7  --     Physical Exam  Blood pressure 108/81, pulse 61, temperature 97.9 F (36.6 C), temperature source Oral, resp. rate 18, weight 56.9 kg, SpO2 100 %. GEN: NAD, well-appearing ENT: NCAT, fair dentition EYES: EOMI CV: Regular, normal S1 and S2 PULM: CTA B ABD: Soft, nontender SKIN: No rashes or lesions EXT: No peripheral edema NEURO: Grossly  intact  Assessment 55F with recurrent/persistent hypokalemia, metabolic alkalosis; also with AKI from a normal baseline.  She is not hypertensive, primary hyperaldosteronism is unlikely to be present.  Possibly she has some gastrointestinal losses from her periodic nausea and vomiting which could present like this.  Also possible is renal potassium/salt wasting nephropathy's of which Gittleman's would be more likely than Bartter's given her age and blood pressure.  In these diseases urine chloride should not be suppressed  1. Recurrent severe hypokalemia and metabolic alkalosis: Differential of renal wasting or gastrointestinal wasting. 2. Periodic nausea and vomiting, unclear etiology, usually postprandial per patient 3. Peptic ulcer disease/GERD  Plan 1. Continue with potassium and crystalloid replacement as you are doing 2. Repeat urine chloride, spot 3. Start 24-hour urine sodium, potassium, creatinine, calcium 4. Check plasma renin and aldosterone levels 5. Do not provide spironolactone or other mineralocorticoid blockade 6. Would not start nonsteroidals at this time 7. We will follow along closely   [name] MD 01/07/2019, 3:59 PM

## 2019-01-07 NOTE — Progress Notes (Signed)
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  Patient has never seen a nephrologist to work-up hypokalemia, specifically, to rule out Gettleman syndrome.  Low normal blood pressure and metabolic alkalosis is noted, with reported polyuria, polydipsia and consequent acute kidney injury.  Other past medical history includes documented hypertension, chronically prolonged QTc interval, GERD, depression, anxiety, GI bleeding, gastric ulcerand insomnia.  Patient was discharged from this hospital on 12/04/2018 after inpatient care for syncope, acute cholecystitis and laparoscopic cholecystectomy, hypokalemia and acute kidney injury.  Patient's diuretics, including Aldactone, may have been discontinued on prior hospitalization.  Patient was asked to follow with the primary care provider and cardiology team.  Patient's problem started about 2 to 3 days ago when she went for CT coronary angiogram that was not done due to hyperkalemia.  Patient was asked to follow-up with her cardiologist for repeat chemistry.  Patient was seen at the cardiologist's office today, and chemistry done revealed potassium of 2.7 and serum creatinine of 2.02.  Patient was advised to come to the hospital for further assessment and management.  As mentioned above, patient endorsed polyuria and polydipsia.  Patient has been having significant muscle ache and weakness.  No headache, no neck pain, no URI symptoms, no fever or chills, no chest pain, no nausea vomiting or other GI symptoms.  Patient be admitted for further assessment and management.    Patient was admitted to evaluate and treat AKI with severe hypokalemia. Nephrology consulted on 7/24 to evaluate and assist with management.  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 Continue supplementation and IVF.   Consultants:   Nephrology  Procedures:   None  Antimicrobials:   None   Subjective: Patient seen and evaluated today with no new acute complaints or concerns. No acute concerns or events noted overnight.  Objective: Vitals:   01/07/19 0146 01/07/19 0732 01/07/19 0927 01/07/19 1107  BP: 131/86 97/60  108/81  Pulse: 63 (!) 59  61  Resp: 20 16  18   Temp: 98.2 F (36.8 C) 98.1 F (36.7 C)  97.9 F (36.6 C)  TempSrc: Oral Oral  Oral  SpO2: 100% 100%  100%  Weight:   56.9 kg     Intake/Output Summary (Last 24 hours) at 01/07/2019 1338 Last data filed at 01/07/2019 1310 Gross per 24 hour  Intake 402 ml  Output 400 ml  Net 2 ml   Filed Weights   01/07/19 0927  Weight: 56.9 kg    Examination:  General exam: Appears calm and comfortable  Respiratory system: Clear to auscultation. Respiratory effort normal. Cardiovascular system: S1 & S2 heard, RRR. No JVD, murmurs, rubs, gallops or clicks. No pedal edema. Gastrointestinal system: Abdomen is nondistended, soft and nontender. No organomegaly or masses felt. Normal bowel sounds heard. Central  nervous system: Alert and oriented. No focal neurological deficits. Extremities: Symmetric 5 x 5 power. Skin: No rashes, lesions or ulcers Psychiatry: Judgement and insight  appear normal. Mood & affect appropriate.     Data Reviewed: I have personally reviewed following labs and imaging studies  CBC: Recent Labs  Lab 01/06/19 1926 01/07/19 0508  WBC 7.2 8.6  HGB 12.2 11.1*  HCT 36.4 33.6*  MCV 94.3 95.7  PLT 319 573   Basic Metabolic Panel: Recent Labs  Lab 01/04/19 1511 01/06/19 0941 01/06/19 1926 01/07/19 0508  NA  --  134 134* 137  K  --  2.7* 2.3* 2.9*  CL  --  83* 81* 92*  CO2  --  34* 39* 33*  GLUCOSE  --  92 97 93  BUN  --  17 17 13   CREATININE 1.90* 2.02* 2.30* 1.89*  CALCIUM  --  9.5 9.2 8.7*  MG  --   --  2.5*  --   PHOS  --   --  3.7  --    GFR: Estimated Creatinine Clearance: 23.6 mL/min (A) (by C-G formula based on SCr of 1.89 mg/dL (H)). Liver Function Tests: No results for input(s): AST, ALT, ALKPHOS, BILITOT, PROT, ALBUMIN in the last 168 hours. No results for input(s): LIPASE, AMYLASE in the last 168 hours. No results for input(s): AMMONIA in the last 168 hours. Coagulation Profile: No results for input(s): INR, PROTIME in the last 168 hours. Cardiac Enzymes: No results for input(s): CKTOTAL, CKMB, CKMBINDEX, TROPONINI in the last 168 hours. BNP (last 3 results) No results for input(s): PROBNP in the last 8760 hours. HbA1C: No results for input(s): HGBA1C in the last 72 hours. CBG: No results for input(s): GLUCAP in the last 168 hours. Lipid Profile: No results for input(s): CHOL, HDL, LDLCALC, TRIG, CHOLHDL, LDLDIRECT in the last 72 hours. Thyroid Function Tests: Recent Labs    01/07/19 0508  TSH 3.126   Anemia Panel: No results for input(s): VITAMINB12, FOLATE, FERRITIN, TIBC, IRON, RETICCTPCT in the last 72 hours. Sepsis Labs: No results for input(s): PROCALCITON, LATICACIDVEN in the last 168 hours.  Recent Results (from the past 240 hour(s))  SARS Coronavirus 2 (CEPHEID - Performed in [place] hospital lab), Hosp Order     Status: None   Collection Time: 01/06/19 11:47 PM   Specimen:  Nasopharyngeal Swab  Result Value Ref Range Status   SARS Coronavirus 2 NEGATIVE NEGATIVE Final    Comment: (NOTE) If result is NEGATIVE SARS-CoV-2 target nucleic acids are NOT DETECTED. The SARS-CoV-2 RNA is generally detectable in upper and lower  respiratory specimens during the acute phase of infection. The lowest  concentration of SARS-CoV-2 viral copies this assay can detect is 250  copies / mL. A negative result does not preclude SARS-CoV-2 infection  and should not be used as the sole basis for treatment or other  patient management decisions.  A negative result may occur with  improper specimen collection / handling, submission of specimen other  than nasopharyngeal swab, presence of viral mutation(s) within the  areas targeted by this assay, and inadequate number of viral copies  (<250 copies / mL). A negative result must be combined with clinical  observations, patient history, and epidemiological information. If result is POSITIVE SARS-CoV-2 target nucleic acids are DETECTED. The SARS-CoV-2 RNA is generally detectable in upper and lower  respiratory specimens dur ing the acute phase of infection.  Positive  results are indicative of active infection with SARS-CoV-2.  Clinical  correlation with patient history and other diagnostic information is  necessary to determine patient infection status.  Positive results do  not rule out bacterial infection or co-infection with other viruses. If result is PRESUMPTIVE POSTIVE SARS-CoV-2 nucleic acids MAY BE PRESENT.   A presumptive positive result was obtained on the submitted specimen  and confirmed on repeat testing.  While 2019 novel coronavirus  (SARS-CoV-2) nucleic acids may be present in the submitted sample  additional confirmatory testing may be necessary for epidemiological  and / or clinical management purposes  to differentiate between  SARS-CoV-2 and other Sarbecovirus currently known to infect humans.  If clinically  indicated additional testing with an alternate test  methodology [phone]) is advised. The SARS-CoV-2 RNA is generally  detectable in upper and lower respiratory sp ecimens during the acute  phase of infection. The expected result is Negative. Fact Sheet for Patients:  StrictlyIdeas.no Fact Sheet for Healthcare Providers: BankingDealers.co.za This test is not yet approved or cleared by the [place] FDA and has been authorized for detection and/or diagnosis of SARS-CoV-2 by FDA under an Emergency Use Authorization (EUA).  This EUA will remain in effect (meaning this test can be used) for the duration of the COVID-19 declaration under Section 564(b)(1) of the Act, 21 U.S.C. section 360bbb-3(b)(1), unless the authorization is terminated or revoked sooner. Performed at [facility], [place] [address] [place], [place] [address]          Radiology Studies: No results found.      Scheduled Meds: . estrogen (conjugated)-medroxyprogesterone  1 tablet Oral QHS  . heparin  5,000 Units Subcutaneous Q8H  . potassium chloride  40 mEq Oral BID   Continuous Infusions: . 0.9 % NaCl with KCl 20 mEq / L 100 mL/hr at 01/07/19 1229     LOS: 0 days    Time spent: 30 minutes    Kairy Folsom Darleen Crocker, DO [facility] Pager [phone]  If 7PM-7AM, please contact night-coverage [email or web address] Password [facility] 01/07/2019, 1:38 PM

## 2019-01-07 NOTE — ED Notes (Signed)
Admitting provider bedside 

## 2019-01-07 NOTE — Telephone Encounter (Signed)
Received call from [facility] [employer] re critical lab K of 2.7 Pt currently admitted for hypkalemia./c

## 2019-01-07 NOTE — Telephone Encounter (Signed)
[facility] [employer] calling with critical labs 

## 2019-01-08 LAB — NA AND K (SODIUM & POTASSIUM), 24 H UR
Potassium Urine: 30 mmol/L
Potassium, 24H Ur: 36 mmol/d (ref 25–125)
Sodium, 24H Ur: 150 mEq/d (ref 40–220)
Sodium, Ur: 125 mmol/L
Urine Total Volume-UNAK24: 1200 mL

## 2019-01-08 LAB — MAGNESIUM: Magnesium: 2.1 mg/dL (ref 1.7–2.4)

## 2019-01-08 LAB — CBC
HCT: 33.7 % — ABNORMAL LOW (ref 36.0–46.0)
Hemoglobin: 10.9 g/dL — ABNORMAL LOW (ref 12.0–15.0)
MCH: 31.8 pg (ref 26.0–34.0)
MCHC: 32.3 g/dL (ref 30.0–36.0)
MCV: 98.3 fL (ref 80.0–100.0)
Platelets: 261 10*3/uL (ref 150–400)
RBC: 3.43 MIL/uL — ABNORMAL LOW (ref 3.87–5.11)
RDW: 13.2 % (ref 11.5–15.5)
WBC: 6.8 10*3/uL (ref 4.0–10.5)
nRBC: 0 % (ref 0.0–0.2)

## 2019-01-08 LAB — BASIC METABOLIC PANEL
Anion gap: 10 (ref 5–15)
BUN: 7 mg/dL — ABNORMAL LOW (ref 8–23)
CO2: 27 mmol/L (ref 22–32)
Calcium: 8.5 mg/dL — ABNORMAL LOW (ref 8.9–10.3)
Chloride: 101 mmol/L (ref 98–111)
Creatinine, Ser: 1.45 mg/dL — ABNORMAL HIGH (ref 0.44–1.00)
GFR calc Af Amer: 42 mL/min — ABNORMAL LOW (ref >60)
GFR calc non Af Amer: 36 mL/min — ABNORMAL LOW (ref >60)
Glucose, Bld: 79 mg/dL (ref 70–99)
Potassium: 3.3 mmol/L — ABNORMAL LOW (ref 3.5–5.1)
Sodium: 138 mmol/L (ref 135–145)

## 2019-01-08 LAB — CHLORIDE, URINE, RANDOM: Chloride Urine: 20 mmol/L

## 2019-01-08 LAB — URINE CULTURE: Culture: <10000 — AB

## 2019-01-08 LAB — CREATININE, URINE, 24 HOUR
Collection Interval-UCRE24: 24 hours
Creatinine, 24H Ur: 1013 mg/d (ref 600–1800)
Creatinine, Urine: 84.44 mg/dL
Urine Total Volume-UCRE24: 1200 mL

## 2019-01-08 LAB — MAGNESIUM, URINE: Magnesium, urine: 2.6 mg/dL

## 2019-01-08 MED ORDER — POTASSIUM CHLORIDE CRYS ER 20 MEQ PO TBCR
40.0000 meq | EXTENDED_RELEASE_TABLET | Freq: Two times a day (BID) | ORAL | Status: DC
  Administered 2019-01-08 – 2019-01-09 (×3): 40 meq via ORAL
  Filled 2019-01-08 (×3): qty 2

## 2019-01-08 NOTE — Progress Notes (Signed)
24 urine collected and taken down to the lab

## 2019-01-08 NOTE — Progress Notes (Signed)
Admit: 01/06/2019 LOS: 1  81F with recurrent hypokalemia, metabolic alkalosis, AKI.  Subjective:  . No interval events . Potassium improved to 3.3, HCO3 27.  Creatinine improved to 1.45 . Blood pressure normal . Some nausea overnight, no emesis, no diarrhea  07/24 0701 - 07/25 0700 In: 4278.9 [P.O.:2146; I.V.:2132.9] Out: 1675 [Urine:1675]  Filed Weights   01/07/19 0927 01/08/19 0454  Weight: 56.9 kg 59.4 kg    Scheduled Meds: . estrogen (conjugated)-medroxyprogesterone  1 tablet Oral QHS  . heparin  5,000 Units Subcutaneous Q8H  . potassium chloride  40 mEq Oral BID   Continuous Infusions: . 0.9 % NaCl with KCl 20 mEq / L 100 mL/hr at 01/08/19 0854   PRN Meds:.alum & mag hydroxide-simeth, diazepam, oxyCODONE, temazepam  Current Labs: reviewed  Results for Brandi, [name] (MRN [id number]) as of 01/08/2019 09:35  Ref. Range 01/06/2019 21:43  Chloride Urine Latest Units: mmol/L <15    Physical Exam:  Blood pressure 127/68, pulse 60, temperature (!) 97 F (36.1 C), temperature source Oral, resp. rate 18, weight 59.4 kg, SpO2 100 %. GEN: NAD, well-appearing ENT: NCAT, fair dentition EYES: EOMI CV: Regular, normal S1 and S2 PULM: CTA B ABD: Soft, nontender SKIN: No rashes or lesions EXT: No peripheral edema NEURO: Grossly intact  A 1. Recurrent severe hypokalemia and metabolic alkalosis: Differential of renal wasting or gastrointestinal wasting. 2. Periodic nausea and vomiting, unclear etiology, usually postprandial per patient 3. Peptic ulcer disease/GERD  P . 24h U for K, Ca in process . Will want to repeat U Cl spot . Stop IVFs, cont KCl 40 PO BID . PRA and Aldo levels pending . Do not Rx diuretics or MRB . No NSAIDs . Will f/u tomorrow AM, likely can f/u with me as outpt and dc tomorrow if labs stable after 24h U collection   [name] MD 01/08/2019, 8:55 AM  Recent Labs  Lab 01/06/19 1926 01/07/19 0508 01/08/19 0504  NA 134* 137 138  K 2.3* 2.9*  3.3*  CL 81* 92* 101  CO2 39* 33* 27  GLUCOSE 97 93 79  BUN 17 13 7*  CREATININE 2.30* 1.89* 1.45*  CALCIUM 9.2 8.7* 8.5*  PHOS 3.7  --   --    Recent Labs  Lab 01/06/19 1926 01/07/19 0508 01/08/19 0504  WBC 7.2 8.6 6.8  HGB 12.2 11.1* 10.9*  HCT 36.4 33.6* 33.7*  MCV 94.3 95.7 98.3  PLT 319 276 261

## 2019-01-08 NOTE — Progress Notes (Addendum)
PROGRESS NOTE [facility]   Brandi [name]   GBT:[id number] DOB: [birth year]  DOA: 01/06/2019 PCP: Vincente Liberty, MD   Brief Narrative:  For full details see H&P.   Brandi [name] is a 73 year old female with past medical history of hypokalemia who was sent to the hospital by her cardiology after outpatient labs showed potassium of 2.7 and creatinine of 2.02.  At the time of admission patient was having muscle aches and weakness therefore she was admitted due to AKA with severe hypokalemia.  Nephrology was consulted and patient was started on IV fluids with KCl.  Subjective: Patient seen and examined, she has no complaints today.  Her potassium is improving.  No acute events overnight.  Assessment & Plan:   Active Problems:   Hypokalemia  AKI Unclear etiology at this time, nephrology recommendations appreciated.  Unlikely hyperaldosteronism given her normal BP.  Suspect to be from dehydration/GI losses. ?  Renal potassium/salt wasting nephropathy.  24-hour potassium collection pending.  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  Will continue p.o. potassium supplementation.  24 hours potassium collection pending.  If labs are stable in a.m. likely to be discharged.  GERD She used to be on Zantac, Check EKG in AM if QTC stable can place on famotidine.   Prolonged QTC Replete electrolytes, check levels along with magnesium in a.m.  DVT prophylaxis: Heparin Code Status: Full code Family Communication: None at bedside Disposition Plan: Per nephrology if labs stable in the morning okay to discharge home and follow-up as an outpatient.  Consultants:   Nephrology  Procedures:   None  Antimicrobials:  None   Objective: Vitals:   01/07/19 1951 01/08/19 0038 01/08/19 0454 01/08/19 0842  BP: 134/78 110/69 121/70 127/68  Pulse: 62 71 (!) 57 60  Resp: 18 18 18 18   Temp: 98 F (36.7 C) 97.8 F (36.6 C) (!) 97 F  (36.1 C)   TempSrc: Oral Oral Oral   SpO2: 100% 99% 100% 100%  Weight:   59.4 kg     Intake/Output Summary (Last 24 hours) at 01/08/2019 1240 Last data filed at 01/08/2019 1142 Gross per 24 hour  Intake 4338.91 ml  Output 1625 ml  Net 2713.91 ml   Filed Weights   01/07/19 0927 01/08/19 0454  Weight: 56.9 kg 59.4 kg    Examination:  General exam: Appears calm and comfortable  Respiratory system: Clear to auscultation. No wheezes,crackle or rhonchi Cardiovascular system: S1 & S2 heard, RRR. No JVD, murmurs, rubs or gallops Central nervous system: Alert and oriented. No focal neurological deficits. Extremities: No pedal edema. Symmetric, strength 5/5   Skin: No rashes, lesions or ulcers Psychiatry: Judgement and insight appear normal. Mood & affect appropriate.    Data Reviewed: I have personally reviewed following labs and imaging studies  CBC: Recent Labs  Lab 01/06/19 1926 01/07/19 0508 01/08/19 0504  WBC 7.2 8.6 6.8  HGB 12.2 11.1* 10.9*  HCT 36.4 33.6* 33.7*  MCV 94.3 95.7 98.3  PLT 319 276 710   Basic Metabolic Panel: Recent Labs  Lab 01/04/19 1511 01/06/19 0941 01/06/19 1926 01/07/19 0508 01/08/19 0504  NA  --  134 134* 137 138  K  --  2.7* 2.3* 2.9* 3.3*  CL  --  83* 81* 92* 101  CO2  --  34* 39* 33* 27  GLUCOSE  --  92 97 93 79  BUN  --  17 17 13  7*  CREATININE 1.90* 2.02* 2.30* 1.89* 1.45*  CALCIUM  --  9.5 9.2 8.7* 8.5*  MG  --   --  2.5*  --  2.1  PHOS  --   --  3.7  --   --    GFR: Estimated Creatinine Clearance: 30.7 mL/min (A) (by C-G formula based on SCr of 1.45 mg/dL (H)). Liver Function Tests: No results for input(s): AST, ALT, ALKPHOS, BILITOT, PROT, ALBUMIN in the last 168 hours. No results for input(s): LIPASE, AMYLASE in the last 168 hours. No results for input(s): AMMONIA in the last 168 hours. Coagulation Profile: No results for input(s): INR, PROTIME in the last 168 hours. Cardiac Enzymes: No results for input(s): CKTOTAL,  CKMB, CKMBINDEX, TROPONINI in the last 168 hours. BNP (last 3 results) No results for input(s): PROBNP in the last 8760 hours. HbA1C: No results for input(s): HGBA1C in the last 72 hours. CBG: No results for input(s): GLUCAP in the last 168 hours. Lipid Profile: No results for input(s): CHOL, HDL, LDLCALC, TRIG, CHOLHDL, LDLDIRECT in the last 72 hours. Thyroid Function Tests: Recent Labs    01/07/19 0508  TSH 3.126   Anemia Panel: No results for input(s): VITAMINB12, FOLATE, FERRITIN, TIBC, IRON, RETICCTPCT in the last 72 hours. Sepsis Labs: No results for input(s): PROCALCITON, LATICACIDVEN in the last 168 hours.  Recent Results (from the past 240 hour(s))  Urine culture     Status: Abnormal   Collection Time: 01/06/19  9:42 PM   Specimen: Urine, Clean Catch  Result Value Ref Range Status   Specimen Description URINE, CLEAN CATCH  Final   Special Requests NONE  Final   Culture (A)  Final    <10,000 COLONIES/mL INSIGNIFICANT GROWTH Performed at [facility], [address] [place], [place] [address]    Report Status 01/08/2019 FINAL  Final  SARS Coronavirus 2 (CEPHEID - Performed in [place] hospital lab), Hosp Order     Status: None   Collection Time: 01/06/19 11:47 PM   Specimen: Nasopharyngeal Swab  Result Value Ref Range Status   SARS Coronavirus 2 NEGATIVE NEGATIVE Final    Comment: (NOTE) If result is NEGATIVE SARS-CoV-2 target nucleic acids are NOT DETECTED. The SARS-CoV-2 RNA is generally detectable in upper and lower  respiratory specimens during the acute phase of infection. The lowest  concentration of SARS-CoV-2 viral copies this assay can detect is 250  copies / mL. A negative result does not preclude SARS-CoV-2 infection  and should not be used as the sole basis for treatment or other  patient management decisions.  A negative result may occur with  improper specimen collection / handling, submission of specimen other  than nasopharyngeal swab,  presence of viral mutation(s) within the  areas targeted by this assay, and inadequate number of viral copies  (<250 copies / mL). A negative result must be combined with clinical  observations, patient history, and epidemiological information. If result is POSITIVE SARS-CoV-2 target nucleic acids are DETECTED. The SARS-CoV-2 RNA is generally detectable in upper and lower  respiratory specimens dur ing the acute phase of infection.  Positive  results are indicative of active infection with SARS-CoV-2.  Clinical  correlation with patient history and other diagnostic information is  necessary to determine patient infection status.  Positive results do  not rule out bacterial infection or co-infection with other viruses. If result is PRESUMPTIVE POSTIVE SARS-CoV-2 nucleic acids MAY BE PRESENT.   A presumptive positive result was obtained on the submitted specimen  and confirmed on repeat testing.  While 2019 novel coronavirus  (  SARS-CoV-2) nucleic acids may be present in the submitted sample  additional confirmatory testing may be necessary for epidemiological  and / or clinical management purposes  to differentiate between  SARS-CoV-2 and other Sarbecovirus currently known to infect humans.  If clinically indicated additional testing with an alternate test  methodology [phone]) is advised. The SARS-CoV-2 RNA is generally  detectable in upper and lower respiratory sp ecimens during the acute  phase of infection. The expected result is Negative. Fact Sheet for Patients:  StrictlyIdeas.no Fact Sheet for Healthcare Providers: BankingDealers.co.za This test is not yet approved or cleared by the [place] FDA and has been authorized for detection and/or diagnosis of SARS-CoV-2 by FDA under an Emergency Use Authorization (EUA).  This EUA will remain in effect (meaning this test can be used) for the duration of the COVID-19 declaration under  Section 564(b)(1) of the Act, 21 U.S.C. section 360bbb-3(b)(1), unless the authorization is terminated or revoked sooner. Performed at [facility], [place] [address] [place], [place] [address]      Radiology Studies: No results found.   Scheduled Meds: . estrogen (conjugated)-medroxyprogesterone  1 tablet Oral QHS  . heparin  5,000 Units Subcutaneous Q8H  . potassium chloride  40 mEq Oral BID   Continuous Infusions:   LOS: 1 day    Time spent: Total of 25 minutes spent with pt, greater than 50% of which was spent in discussion of  treatment, counseling and coordination of care    Chipper Oman, MD  How to contact the [facility] Attending or Consulting provider [place] or covering provider during after hours [place], for this patient?  1. Check the care team in [facility] and look for a) attending/consulting [facility] provider listed and b) the [facility] team listed 2. Log into [email or web address] and use [facility]'s universal password to access. If you do not have the password, please contact the hospital operator. 3. Locate the [facility] provider you are looking for under [facility] and page to a number that you can be directly reached. 4. If you still have difficulty reaching the provider, please page the [facility] (Director on Call) for the Hospitalists listed on amion for assistance.

## 2019-01-08 NOTE — Progress Notes (Signed)
Pt requesting to be on regular diet, asked MD, MD agreeable. Placed new order per request.

## 2019-01-09 LAB — BASIC METABOLIC PANEL WITH GFR
Anion gap: 10 (ref 5–15)
BUN: <5 mg/dL — ABNORMAL LOW (ref 8–23)
CO2: 24 mmol/L (ref 22–32)
Calcium: 9 mg/dL (ref 8.9–10.3)
Chloride: 102 mmol/L (ref 98–111)
Creatinine, Ser: 1.15 mg/dL — ABNORMAL HIGH (ref 0.44–1.00)
GFR calc Af Amer: 55 mL/min — ABNORMAL LOW (ref >60)
GFR calc non Af Amer: 48 mL/min — ABNORMAL LOW (ref >60)
Glucose, Bld: 89 mg/dL (ref 70–99)
Potassium: 3.6 mmol/L (ref 3.5–5.1)
Sodium: 136 mmol/L (ref 135–145)

## 2019-01-09 LAB — CALCIUM / CREATININE RATIO, URINE
Calcium, Ur: 1.5 mg/dL
Calcium/Creat.Ratio: 20 mg/g creat — ABNORMAL LOW (ref 29–442)
Creatinine, Urine: 74.1 mg/dL

## 2019-01-09 LAB — CALCIUM, URINE, 24 HOUR
Calcium, 24 hour urine: 19 mg/24 hr — ABNORMAL LOW (ref 26–354)
Calcium, Ur: 1.6 mg/dL
Total Volume: 1200

## 2019-01-09 LAB — MAGNESIUM: Magnesium: 2.1 mg/dL (ref 1.7–2.4)

## 2019-01-09 MED ORDER — ALUM & MAG HYDROXIDE-SIMETH 200-200-20 MG/5ML PO SUSP: 15.0000 mL | ORAL | 0 refills | Status: AC | PRN

## 2019-01-09 MED ORDER — PANTOPRAZOLE SODIUM 40 MG PO TBEC: 40.0000 mg | DELAYED_RELEASE_TABLET | Freq: Every day | ORAL | 0 refills | Status: AC

## 2019-01-09 MED ORDER — DOCUSATE SODIUM 100 MG PO CAPS: 100.0000 mg | ORAL_CAPSULE | Freq: Every day | ORAL | 2 refills | Status: AC | PRN

## 2019-01-09 MED ORDER — POTASSIUM CHLORIDE CRYS ER 20 MEQ PO TBCR: 40.0000 meq | EXTENDED_RELEASE_TABLET | Freq: Two times a day (BID) | ORAL | 0 refills | Status: DC

## 2019-01-09 NOTE — Progress Notes (Signed)
Admit: 01/06/2019 LOS: 2  70F with recurrent hypokalemia, metabolic alkalosis, AKI.  Subjective:  . No interval events; feels well . Potassium improved to 3.6, HCO3 24.  Creatinine improved to 1.15 . Blood pressure normal . Good PO, no N/V  07/25 0701 - 07/26 0700 In: 840 [P.O.:840] Out: 850 [Urine:850]  Filed Weights   01/07/19 0927 01/08/19 0454  Weight: 56.9 kg 59.4 kg    Scheduled Meds: . estrogen (conjugated)-medroxyprogesterone  1 tablet Oral QHS  . heparin  5,000 Units Subcutaneous Q8H  . potassium chloride  40 mEq Oral BID   Continuous Infusions:  PRN Meds:.alum & mag hydroxide-simeth, diazepam, oxyCODONE, temazepam  Current Labs: reviewed  Results for Brandi [name], [name] (MRN [id number]) as of 01/08/2019 09:35  Ref. Range 01/06/2019 21:43  Chloride Urine Latest Units: mmol/L <15   Results for Brandi, [name] (MRN [id number]) as of 01/09/2019 10:42  Ref. Range 01/07/2019 18:28  Calcium, Ur Latest Ref Range: Not Estab. mg/dL 1.6  Calcium, 24 hour urine Latest Ref Range: 26 - 354 mg/24 hr 19 (L)  Chloride Urine Latest Units: mmol/L 20  Potassium Urine Latest Units: mmol/L 30  Sodium, Urine Latest Units: mmol/L 125  Total Volume Unknown 1,200  Creatinine, Urine Latest Units: mg/dL 84.44  Urine Total Volume-UCRE24 Latest Units: mL 1,200  Collection Interval-UCRE24 Latest Units: hours 24  Creatinine, 24H Ur Latest Ref Range: 600 - 1,800 mg/day 1,013    Physical Exam:  Blood pressure 135/79, pulse 83, temperature 98.2 F (36.8 C), temperature source Oral, resp. rate 18, weight 59.4 kg, SpO2 100 %. GEN: NAD, well-appearing ENT: NCAT, fair dentition EYES: EOMI CV: Regular, normal S1 and S2 PULM: CTA B ABD: Soft, nontender SKIN: No rashes or lesions EXT: No peripheral edema NEURO: Grossly intact  A 1. Recurrent severe hypokalemia and metabolic alkalosis: Differential of renal wasting or gastrointestinal wasting.  Has hypocalciuria AND low urine chloride -- a little  unusual.  Low U Ca c/w Gitelman's or thiazide like effect but would expect higher UCl  2. Periodic nausea and vomiting, unclear etiology, usually postprandial per patient 3. Peptic ulcer disease/GERD  P . Cont 40 PO KCL BID for now . Will arrange f/u labs and OV with me at [place] . PRA and Aldo levels pending . Do not Rx diuretics or MRB . No NSAIDs . OK for DC   Brandi Grippe MD 01/09/2019, 10:42 AM  Recent Labs  Lab 01/06/19 1926 01/07/19 0508 01/08/19 0504 01/09/19 0734  NA 134* 137 138 136  K 2.3* 2.9* 3.3* 3.6  CL 81* 92* 101 102  CO2 39* 33* 27 24  GLUCOSE 97 93 79 89  BUN 17 13 7* <5*  CREATININE 2.30* 1.89* 1.45* 1.15*  CALCIUM 9.2 8.7* 8.5* 9.0  PHOS 3.7  --   --   --    Recent Labs  Lab 01/06/19 1926 01/07/19 0508 01/08/19 0504  WBC 7.2 8.6 6.8  HGB 12.2 11.1* 10.9*  HCT 36.4 33.6* 33.7*  MCV 94.3 95.7 98.3  PLT 319 276 261

## 2019-01-09 NOTE — Discharge Instructions (Signed)

## 2019-01-09 NOTE — Progress Notes (Signed)
Patient alert and oriented denies pain, v/s stable. Iv and tele removed. Discharge instruction explain and given to the patient, all questions answered patient d/c home per order

## 2019-01-12 ENCOUNTER — Telehealth: Payer: Self-pay | Admitting: *Deleted

## 2019-01-12 DIAGNOSIS — E873 Alkalosis: Secondary | ICD-10-CM | POA: Diagnosis not present

## 2019-01-12 NOTE — Telephone Encounter (Signed)
Pt has answered NO to all covid19 prescreen questions.        COVID-19 Pre-Screening Questions:  . In the past 7 to 10 days have you had a cough,  shortness of breath, headache, congestion, fever (100 or greater) body aches, chills, sore throat, or sudden loss of taste or sense of smell? . Have you been around anyone with known Covid 19. . Have you been around anyone who is awaiting Covid 19 test results in the past 7 to 10 days? . Have you been around anyone who has been exposed to Covid 19, or has mentioned symptoms of Covid 19 within the past 7 to 10 days?  If you have any concerns/questions about symptoms patients report during screening (either on the phone or at threshold). Contact the provider seeing the patient or DOD for further guidance.  If neither are available contact a member of the leadership team.

## 2019-01-13 ENCOUNTER — Ambulatory Visit: Payer: Medicare Other | Admitting: Cardiology

## 2019-01-13 ENCOUNTER — Telehealth: Payer: Self-pay | Admitting: *Deleted

## 2019-01-13 LAB — ALDOSTERONE + RENIN ACTIVITY W/ RATIO
ALDO / PRA Ratio: 2.5 (ref 0.0–30.0)
Aldosterone: 3.8 ng/dL (ref 0.0–30.0)
PRA LC/MS/MS: 1.504 ng/mL/hr (ref 0.167–5.380)

## 2019-01-13 NOTE — Telephone Encounter (Signed)
Pt is scheduled to see Ellen Henri PA-C for 01/17/19 at 1:45 pm.  Pt made aware of appt date and time by Scheduling team.

## 2019-01-13 NOTE — Telephone Encounter (Signed)
-----   Message from Rivka Barbara sent at 01/13/2019 10:47 AM EDT ----- Regarding: RE: abn labs, cardiac CTA postponed Hey lady,   This patient has an appt on 8/3 with [place].  ----- Message ----- From: Nuala Alpha, LPN Sent: 4/35/6861  11:19 AM EDT To: Rivka Barbara Subject: FW: abn labs, cardiac CTA postponed            Nada Libman, this pt will need an appt with Meda Coffee or an APP on her team as a regular office visit, when she is discharged.  I wanted to see if you can help me keep a lookout for her to get her in?  Dr. Radford Pax said she thinks she is still admitted but its hard to tell in her chart.  Thanks, Karlene Einstein  ----- Message ----- From: Sueanne Margarita, MD Sent: 01/11/2019  10:48 AM EDT To: Nuala Alpha, LPN, Lorenza Evangelist, RN Subject: RE: abn labs, cardiac CTA postponed            It appears she is in the hospital with hypokalemia.  Renal function no amenable to coronary CTA at this time.   Ivy,  Please have her followup with Dr. Meda Coffee once she is discharged from hospital to determine further diagnostic modality for assessment of CAD.  Traci ----- Message ----- From: Lorenza Evangelist, RN Sent: 01/10/2019   3:17 PM EDT To: Dorothy Spark, MD, Armando Gang, # Subject: abn labs, cardiac CTA postponed                Callie, We did an istat creat on this patient the day of her cardiac CTA on 7/21 and her creat was 1.9. We cancelled her test and are willing to reschedule at a later date after her kidney function is addressed.  Thank you Marchia Bond RN Navigator Cardiac Imaging [facility] [phone] Office  [phone] Cell

## 2019-01-15 NOTE — Discharge Summary (Signed)
[facility] Discharge Summary   Patient: Brandi [name] BSJ:[id number]   PCP: Vincente Liberty, MD DOB: 1945-07-29   Date of admission: 01/06/2019   Date of discharge: 01/09/2019     Discharge Diagnoses:  Active Problems:   Hypokalemia   Admitted From: home Disposition:  Home   Recommendations for Outpatient Follow-up:  1. Please follow up with PCP in 1 week   Follow-up Information    Vincente Liberty, MD. Schedule an appointment as soon as possible for a visit in 1 week(s).   Specialty: Pulmonary Disease Contact information: [place] [address] [phone]        Rexene Agent, MD. Schedule an appointment as soon as possible for a visit in 2 week(s).   Specialty: Nephrology Contact information: [address] [place] [address] [phone]          Diet recommendation: REGULAR DIET  Activity: The patient is advised to gradually reintroduce usual activities,as tolerated.  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  Patient has never seen a nephrologist to work-up hypokalemia, specifically, to rule out Gettleman syndrome.  Low normal blood pressure and metabolic alkalosis is noted, with reported polyuria, polydipsia and consequent acute kidney injury.  Other past medical history includes documented hypertension, chronically prolonged QTc interval, GERD, depression, anxiety, GI bleeding, gastric ulcerand insomnia.  Patient was discharged from this hospital on 12/04/2018 after inpatient care for syncope, acute cholecystitis and laparoscopic cholecystectomy, hypokalemia and acute kidney injury.  Patient's diuretics, including Aldactone, may have been discontinued on prior hospitalization.  Patient was asked to follow with the primary care provider and  cardiology team.  Patient's problem started about 2 to 3 days ago when she went for CT coronary angiogram that was not done due to hyperkalemia.  Patient was asked to follow-up with her cardiologist for repeat chemistry.  Patient was seen at the cardiologist's office today, and chemistry done revealed potassium of 2.7 and serum creatinine of 2.02.  Patient was advised to come to the hospital for further assessment and management.  As mentioned above, patient endorsed polyuria and polydipsia.  Patient has been having significant muscle ache and weakness.  No headache, no neck pain, no URI symptoms, no fever or chills, no chest pain, no nausea vomiting or other GI symptoms.  Patient be admitted for further assessment and management.  "  Hospital Course:  Summary of her active problems in the hospital is as following. AKI Unclear etiology at this time, nephrology recommendations appreciated.  Unlikely hyperaldosteronism given her normal BP.  Suspect to be from dehydration/GI losses. ?  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  Will continue p.o. potassium supplementation.   GERD place on famotidine.   Prolonged QTC Repleted electrolytes,  Patient was ambulatory without any assistance. On the day of the discharge the patient's vitals were stable, and no other acute medical condition were reported by patient. the patient was felt safe to be discharge at Home with no therapy needed on discharge.  Consultants: nephrology Procedures: none  DISCHARGE MEDICATION: Allergies as of 01/09/2019      Reactions   Aspirin Other (See Comments)   Stomach ulcer, now inactive, OK with low dose ASA      Medication List    STOP taking these  medications   carvedilol 3.125 MG tablet Commonly known as: COREG   diclofenac sodium 1 % Gel Commonly known as: VOLTAREN     TAKE these medications   alum & mag  hydroxide-simeth [phone] MG/5ML suspension Commonly known as: MAALOX/MYLANTA Take 15 mLs by mouth every 4 (four) hours as needed for indigestion or heartburn.   diazepam 10 MG tablet Commonly known as: VALIUM TAKE 1 TABLET BY MOUTH EVERY 12 HOURS AS NEEDED FOR ANXIETY What changed:   how much to take  how to take this  when to take this  reasons to take this   docusate sodium 100 MG capsule Commonly known as: Colace Take 1 capsule (100 mg total) by mouth daily as needed.   estrogen (conjugated)-medroxyprogesterone 0.3-1.5 MG tablet Commonly known as: Prempro Take 1 tablet by mouth daily. What changed: when to take this   oxyCODONE 5 MG immediate release tablet Commonly known as: Oxy IR/ROXICODONE Take 1-2 tablets (5-10 mg total) by mouth every 4 (four) hours as needed for moderate pain.   pantoprazole 40 MG tablet Commonly known as: Protonix Take 1 tablet (40 mg total) by mouth daily for 14 days.   polyethylene glycol 17 g packet Commonly known as: MiraLax Take 17 g by mouth daily. For constipation   potassium chloride SA 20 MEQ tablet Commonly known as: K-DUR Take 2 tablets (40 mEq total) by mouth 2 (two) times daily for 13 days.   temazepam 15 MG capsule Commonly known as: RESTORIL 1-2 po qhs prn   traZODone 50 MG tablet Commonly known as: DESYREL Take 1 tablet (50 mg total) by mouth at bedtime.      Allergies  Allergen Reactions   Aspirin Other (See Comments)    Stomach ulcer, now inactive, OK with low dose ASA   Discharge Instructions    Diet - low sodium heart healthy   Complete by: As directed    Increase activity slowly   Complete by: As directed      Discharge Exam: Filed Weights   01/07/19 0927 01/08/19 0454  Weight: 56.9 kg 59.4 kg   Vitals:   01/08/19 1920 01/09/19 0449  BP: 122/72 135/79  Pulse: 69 83  Resp: 18 18  Temp: 98.6 F (37 C) 98.2 F (36.8 C)  SpO2: 99% 100%   General: Appear in no distress, no Rash; Oral Mucosa  Clear, moist. no Abnormal Mass Or lumps Cardiovascular: S1 and S2 Present, no Murmur, Respiratory: normal respiratory effort, Bilateral Air entry present and Clear to Auscultation, no Crackles, no wheezes Abdomen: Bowel Sound present, Soft and no tenderness, no hernia Extremities: no Pedal edema, no calf tenderness Neurology: alert and oriented to time, place, and person affect appropriate. normal without focal findings, mental status, speech normal, alert and oriented x3, PERLA, Motor strength 5/5 and symmetric and sensation grossly normal to light touch   The results of significant diagnostics from this hospitalization (including imaging, microbiology, ancillary and laboratory) are listed below for reference.    Significant Diagnostic Studies: No results found.  Microbiology: Recent Results (from the past 240 hour(s))  Urine culture     Status: Abnormal   Collection Time: 01/06/19  9:42 PM   Specimen: Urine, Clean Catch  Result Value Ref Range Status   Specimen Description URINE, CLEAN CATCH  Final   Special Requests NONE  Final   Culture (A)  Final    <10,000 COLONIES/mL INSIGNIFICANT GROWTH Performed at [facility], [address] [place], [place] [address]  Report Status 01/08/2019 FINAL  Final  SARS Coronavirus 2 (CEPHEID - Performed in [facility] hospital lab), Hosp Order     Status: None   Collection Time: 01/06/19 11:47 PM   Specimen: Nasopharyngeal Swab  Result Value Ref Range Status   SARS Coronavirus 2 NEGATIVE NEGATIVE Final    Comment: (NOTE) If result is NEGATIVE SARS-CoV-2 target nucleic acids are NOT DETECTED. The SARS-CoV-2 RNA is generally detectable in upper and lower  respiratory specimens during the acute phase of infection. The lowest  concentration of SARS-CoV-2 viral copies this assay can detect is 250  copies / mL. A negative result does not preclude SARS-CoV-2 infection  and should not be used as the sole basis for treatment or other    patient management decisions.  A negative result may occur with  improper specimen collection / handling, submission of specimen other  than nasopharyngeal swab, presence of viral mutation(s) within the  areas targeted by this assay, and inadequate number of viral copies  (<250 copies / mL). A negative result must be combined with clinical  observations, patient history, and epidemiological information. If result is POSITIVE SARS-CoV-2 target nucleic acids are DETECTED. The SARS-CoV-2 RNA is generally detectable in upper and lower  respiratory specimens dur ing the acute phase of infection.  Positive  results are indicative of active infection with SARS-CoV-2.  Clinical  correlation with patient history and other diagnostic information is  necessary to determine patient infection status.  Positive results do  not rule out bacterial infection or co-infection with other viruses. If result is PRESUMPTIVE POSTIVE SARS-CoV-2 nucleic acids MAY BE PRESENT.   A presumptive positive result was obtained on the submitted specimen  and confirmed on repeat testing.  While 2019 novel coronavirus  (SARS-CoV-2) nucleic acids may be present in the submitted sample  additional confirmatory testing may be necessary for epidemiological  and / or clinical management purposes  to differentiate between  SARS-CoV-2 and other Sarbecovirus currently known to infect humans.  If clinically indicated additional testing with an alternate test  methodology [phone]) is advised. The SARS-CoV-2 RNA is generally  detectable in upper and lower respiratory sp ecimens during the acute  phase of infection. The expected result is Negative. Fact Sheet for Patients:  StrictlyIdeas.no Fact Sheet for Healthcare Providers: BankingDealers.co.za This test is not yet approved or cleared by the [place] FDA and has been authorized for detection and/or diagnosis of SARS-CoV-2  by FDA under an Emergency Use Authorization (EUA).  This EUA will remain in effect (meaning this test can be used) for the duration of the COVID-19 declaration under Section 564(b)(1) of the Act, 21 U.S.C. section 360bbb-3(b)(1), unless the authorization is terminated or revoked sooner. Performed at [facility], [place] [address], [place], [place] [address]      Labs: CBC: No results for input(s): WBC, NEUTROABS, HGB, HCT, MCV, PLT in the last 168 hours. Basic Metabolic Panel: Recent Labs  Lab 01/09/19 0734  NA 136  K 3.6  CL 102  CO2 24  GLUCOSE 89  BUN <5*  CREATININE 1.15*  CALCIUM 9.0  MG 2.1   Liver Function Tests: No results for input(s): AST, ALT, ALKPHOS, BILITOT, PROT, ALBUMIN in the last 168 hours. No results for input(s): LIPASE, AMYLASE in the last 168 hours. No results for input(s): AMMONIA in the last 168 hours. Cardiac Enzymes: No results for input(s): CKTOTAL, CKMB, CKMBINDEX, TROPONINI in the last 168 hours. BNP (last 3 results) No results for input(s):  BNP in the last 8760 hours. CBG: No results for input(s): GLUCAP in the last 168 hours. Time spent: 35 minutes  Signed:  Berle Mull  [facility] 01/09/2019

## 2019-01-17 ENCOUNTER — Ambulatory Visit (INDEPENDENT_AMBULATORY_CARE_PROVIDER_SITE_OTHER): Payer: Medicare Other | Admitting: Cardiology

## 2019-01-17 ENCOUNTER — Encounter: Payer: Self-pay | Admitting: Cardiology

## 2019-01-17 ENCOUNTER — Other Ambulatory Visit: Payer: Self-pay

## 2019-01-17 VITALS — BP 120/70 | HR 88 | Ht 64.0 in | Wt 128.8 lb

## 2019-01-17 DIAGNOSIS — R0789 Other chest pain: Secondary | ICD-10-CM | POA: Diagnosis not present

## 2019-01-17 DIAGNOSIS — I472 Ventricular tachycardia: Secondary | ICD-10-CM | POA: Diagnosis not present

## 2019-01-17 DIAGNOSIS — I4729 Other ventricular tachycardia: Secondary | ICD-10-CM

## 2019-01-17 DIAGNOSIS — R079 Chest pain, unspecified: Secondary | ICD-10-CM

## 2019-01-17 MED ORDER — METOPROLOL TARTRATE 100 MG PO TABS: 100.0000 mg | ORAL_TABLET | Freq: Once | ORAL | 0 refills | Status: AC

## 2019-01-17 NOTE — Patient Instructions (Addendum)
Medication Instructions:  none If you need a refill on your cardiac medications before your next appointment, please call your pharmacy.   Lab work: none If you have labs (blood work) drawn today and your tests are completely normal, you will receive your results only by: Marland Kitchen MyChart Message (if you have MyChart) OR . A paper copy in the mail If you have any lab test that is abnormal or we need to change your treatment, we will call you to review the results.  Testing/Procedures: Coronary CT  Follow-Up: 3 MONTHS with Dr Meda Coffee At [facility], you and your health needs are our priority.  As part of our continuing mission to provide you with exceptional heart care, we have created designated Provider Care Teams.  These Care Teams include your primary Cardiologist (physician) and Advanced Practice Providers (APPs -  Physician Assistants and Nurse Practitioners) who all work together to provide you with the care you need, when you need it. .   Any Other Special Instructions Will Be Listed Below (If Applicable). Your cardiac CT will be scheduled at one of the below locations:   [facility] [address] [place], [place] [address] (336) [place] [address] [place], [place] [address] [phone]  Please arrive at the [facility] main entrance of [facility] 30-45 minutes prior to test start time. Proceed to the [facility] Radiology Department (first floor) to check-in and test prep.  Please follow these instructions carefully (unless otherwise directed):   On the Night Before the Test: . Be sure to Drink plenty of water. . Do not consume any caffeinated/decaffeinated beverages or chocolate 12 hours prior to your test. . Do not take any antihistamines 12 hours prior to your test. On the Day of the Test: . Drink plenty of water. Do not drink any water within one hour of the test. . Do not eat  any food 4 hours prior to the test. . You may take your regular medications prior to the test.  . Take metoprolol 100 mg (1 TABLET) (Lopressor) two hours prior to test. . FEMALES- please wear underwire-free bra if available   *     After the Test: . Drink plenty of water. . After receiving IV contrast, you may experience a mild flushed feeling. This is normal. . On occasion, you may experience a mild rash up to 24 hours after the test. This is not dangerous. If this occurs, you can take Benadryl 25 mg and increase your fluid intake. . If you experience trouble breathing, this can be serious. If it is severe call 911 IMMEDIATELY. If it is mild, please call our office. . If you take any of these medications: Glipizide/Metformin, Avandament, Glucavance, please do not take 48 hours after completing test.    Please contact the cardiac imaging nurse navigator should you have any questions/concerns Marchia Bond, RN Navigator Cardiac [place] [facility] [phone] Office  [phone] Cell

## 2019-01-17 NOTE — Progress Notes (Signed)
01/17/2019 Brandi [name]   [birth year]  [id number]  Primary Physician Vincente Liberty, MD Primary Cardiologist: Ena Dawley, MD  Electrophysiologist: None   Reason for Visit/CC: [facility] F/u for CP and NSVT   HPI:  Brandi [name] is a 73 y.o. female who is being seen today for post hospital f/u. She was admitted in June to [facility] for chest pain evaluation. Described as indigestion. Not worse w/ exertion. Associated with persistent n/v and also reported melena. On admit, she was found to be hypokalemic. ? GIB. Endoscopy showed gastritis. Also has trivial + trop elevation (0.10 >> 0.05), but not c/w ACS. Most consistent w/ demand ischemia. There was ? Of syncope while in the CT scanner but no arrhthymias on the monitor at that time, however she did have a 17 beat run of NSVT, later on during admit but this was in the setting of severe hypokalemia and hypomagnesemia from persistent vomiting. Echo showed normal LVEF and wall motion. No recurrent VT after correction of electrolyte abnormalties. However, Dr. Meda Coffee recommend further outpatient cardiac w/u with a coronary CTA to r/o CAD. This has been ordered but not yet arranged. Pt has now recovered from her gastritis but has had issues with hypokalemia. She was recently readmitted last week for hypokalemia w/ K of 2.7. Admitted by IM and started on supplementation. Unable to use spironolactone due to issues with hypotension in the past.   Today, she reports that she is feeling better. GI symptoms have resolved. Denies CP. No palpitations, dyspnea, syncope/ near syncope. Has been compliant w/ her potassium supplementation. She is being followed for this closely by her PCP and nephrology. ? 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    Cardiac Studies  2D Echo 11/29/18 IMPRESSIONS    1. The left ventricle has normal systolic function, with an ejection fraction of 60-65%. The cavity size was normal. Left ventricular diastolic function could not be evaluated.   2. The right ventricle has normal systolc function. The cavity was normal. There is no increase in right ventricular wall thickness.  3. Mild thickening of the mitral valve leaflet.  4. Aortic valve regurgitation was not assessed by color flow Doppler.  5. Pulmonic valve regurgitation was not assessed by color flow Doppler.  FINDINGS  Left Ventricle: The left ventricle has normal systolic function, with an ejection fraction of 60-65%. The cavity size was normal. There is no increase in left ventricular wall thickness. Left ventricular diastolic function could not be evaluated.    Right Ventricle: The right ventricle has normal systolic function. The cavity was normal. There is no increase in right ventricular wall thickness.  Left Atrium: Left atrial size was normal in size.  Right Atrium: Right atrial size was normal in size. Right atrial pressure is estimated at 10 mmHg.  Interatrial Septum: No atrial level shunt detected by color flow Doppler.  Pericardium: There is no evidence of pericardial effusion.  Mitral Valve: The mitral valve is normal in structure. Mild thickening of the mitral valve leaflet. Mitral valve regurgitation is trivial by color flow Doppler.  Tricuspid Valve: The tricuspid valve was normal in structure. Tricuspid valve regurgitation was not visualized by color flow Doppler.  Aortic Valve: The aortic valve is normal in structure. Aortic valve regurgitation was not assessed by color flow Doppler.  Pulmonic Valve: Pulmonic valve regurgitation was not assessed by color flow Doppler.  Venous: The inferior vena cava was not well visualized. The inferior vena cava is normal in size with greater than 50% respiratory  variability.  Compared to previous exam: LVEF 60-65% with normal wall motion abnormalities. Diastolic function wasn's assessed. No significant valvular abnormalities.   Current Meds  Medication Sig  . alum & mag hydroxide-simeth  (MAALOX/MYLANTA) 200-200-20 MG/5ML suspension Take 15 mLs by mouth every 4 (four) hours as needed for indigestion or heartburn.  . diazepam (VALIUM) 10 MG tablet TAKE 1 TABLET BY MOUTH EVERY 12 HOURS AS NEEDED FOR ANXIETY  . docusate sodium (COLACE) 100 MG capsule Take 1 capsule (100 mg total) by mouth daily as needed.  Marland Kitchen estrogen, conjugated,-medroxyprogesterone (PREMPRO) 0.3-1.5 MG tablet Take 1 tablet by mouth daily.  Marland Kitchen oxyCODONE (OXY IR/ROXICODONE) 5 MG immediate release tablet Take 1-2 tablets (5-10 mg total) by mouth every 4 (four) hours as needed for moderate pain.  . pantoprazole (PROTONIX) 40 MG tablet Take 1 tablet (40 mg total) by mouth daily for 14 days.  . potassium chloride SA (K-DUR) 20 MEQ tablet Take 40 mEq by mouth 3 (three) times daily.  . temazepam (RESTORIL) 15 MG capsule 1-2 po qhs prn   Allergies  Allergen Reactions  . Aspirin Other (See Comments)    Stomach ulcer, now inactive, OK with low dose ASA   Past Medical History:  Diagnosis Date  . Anxiety    takes Valium daily as needed  . Chronic back pain   . Chronic back pain   . Complication of anesthesia 1980s   "I stopped breathing on the table; I was gettin my wisdom teeth extracted"  . Constipation   . Depression    takes Lexapro daily  . Dysrhythmia    pt. reports that her heart skips sometimes   . GERD (gastroesophageal reflux disease)    takes Zantac daily  . History of blood transfusion    no abnormal reaction noted  . History of colon polyps    benign  . History of gastric ulcer   . History of GI bleed   . History of hiatal hernia   . History of stress test    done in [place]- done in  1992, told that it was normal.   . Hypertension   . Insomnia    takes Restoril and Trazodone nightly as needed  . Joint pain   . Joint swelling   . Nocturia   . Osteoarthritis   . Peripheral edema    occasionally but never been on fluid pill   Family History  Problem Relation Age of Onset  . Cancer Mother         colon cancer  . Cancer Father        colon cancer  . Cancer Sister 50       died of colon cancer  . Heart disease Neg Hx    Past Surgical History:  Procedure Laterality Date  . BACK SURGERY    . BIOPSY  11/30/2018   Procedure: BIOPSY;  Surgeon: [name], MD;  Location: [place];  Service: Gastroenterology;;  . CHOLECYSTECTOMY N/A 12/02/2018   Procedure: LAPAROSCOPIC CHOLECYSTECTOMY WITH INTRAOPERATIVE CHOLANGIOGRAM;  Surgeon: Coralie Keens, MD;  Location: [place];  Service: General;  Laterality: N/A;  . COLONOSCOPY    . COLONOSCOPY WITH PROPOFOL N/A 11/30/2018   Procedure: COLONOSCOPY WITH PROPOFOL;  Surgeon: [name], MD;  Location: [place];  Service: Gastroenterology;  Laterality: N/A;  . ESOPHAGOGASTRODUODENOSCOPY N/A 06/16/2014   Procedure: ESOPHAGOGASTRODUODENOSCOPY (EGD);  Surgeon: Missy Sabins, MD;  [facility] ENDOSCOPY;  Service: Endoscopy;  Laterality: N/A;  . ESOPHAGOGASTRODUODENOSCOPY (EGD) WITH PROPOFOL N/A  11/30/2018   Procedure: ESOPHAGOGASTRODUODENOSCOPY (EGD) WITH PROPOFOL;  Surgeon: [name], MD;  Location: [place];  Service: Gastroenterology;  Laterality: N/A;  . JOINT REPLACEMENT    . LUMBAR FUSION    . SHOULDER SURGERY Left   . TOE SURGERY Right    "big toe was coming off"  . TOE SURGERY Left    "bone spur"  . TOTAL HIP ARTHROPLASTY Left 02/26/2015   Procedure: TOTAL HIP ARTHROPLASTY ANTERIOR APPROACH;  Surgeon: Frederik Pear, MD;  Location: [place];  Service: Orthopedics;  Laterality: Left;  . TOTAL HIP ARTHROPLASTY Right 03/21/2016   Procedure: TOTAL HIP ARTHROPLASTY ANTERIOR APPROACH;  Surgeon: Frederik Pear, MD;  Location: [place];  Service: Orthopedics;  Laterality: Right;  . WISDOM TOOTH EXTRACTION  1980s   Social History   Socioeconomic History  . Marital status: Married    Spouse name: Not on file  . Number of children: Not on file  . Years of education: Not on file  . Highest education level: Not on file  Occupational History  .  Occupation: retired    Fish farm manager: [place]  . Financial resource strain: Not on file  . Food insecurity    Worry: Not on file    Inability: Not on file  . Transportation needs    Medical: Not on file    Non-medical: Not on file  Tobacco Use  . Smoking status: Never Smoker  . Smokeless tobacco: Never Used  Substance and Sexual Activity  . Alcohol use: No  . Drug use: No  . Sexual activity: Yes    Birth control/protection: Post-menopausal  Lifestyle  . Physical activity    Days per week: Not on file    Minutes per session: Not on file  . Stress: Not on file  Relationships  . Social Herbalist on phone: Not on file    Gets together: Not on file    Attends religious service: Not on file    Active member of club or organization: Not on file    Attends meetings of clubs or organizations: Not on file    Relationship status: Not on file  . Intimate partner violence    Fear of current or ex partner: Not on file    Emotionally abused: Not on file    Physically abused: Not on file    Forced sexual activity: Not on file  Other Topics Concern  . 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  Cardiovascular: negative for chest pain, dyspnea on exertion, edema, orthopnea, palpitations, paroxysmal nocturnal dyspnea or shortness of breath Dermatological: negative for rash Respiratory: negative for cough or wheezing Urologic: negative for hematuria Abdominal: negative for nausea, vomiting, diarrhea, bright red blood per rectum, melena, or  hematemesis Neurologic: negative for visual changes, syncope, or dizziness All other systems reviewed and are otherwise negative except as noted above.   Physical Exam:  Blood pressure 120/70, pulse 88, height 5\' 4"  (1.626 m), weight 128  lb 12.8 oz (58.4 kg), SpO2 98 %.  General appearance: alert, cooperative and no distress Neck: no carotid bruit and no JVD Lungs: clear to auscultation bilaterally Heart: regular rate and rhythm, S1, S2 normal, no murmur, click, rub or gallop Extremities: extremities normal, atraumatic, no cyanosis or edema Pulses: 2+ and symmetric Skin: Skin color, texture, turgor normal. No rashes or lesions Neurologic: Grossly normal  EKG not performed- personally reviewed   ASSESSMENT AND PLAN:   1. Recent CP and NSVT: as outlined above, suspect recent CP was 2/2 gastritis and suspect 17 beat run of NSVT was 2/2 hypokalemia and magnesemia from persistent vomiting w/ gastritis. Echo showed normal LVEF. She denies any recurrent CP and no syncope/ near syncope or palpitations. However, for complete cardiac assessment we recommend coronary CTA to r/o CAD.    Follow-Up w/ Dr. Meda Coffee in 3 months, or sooner if abnormal coronary CTA   Jed Kutch Ladoris Gene, MHS [facility] 01/17/2019 2:21 PM

## 2019-01-19 DIAGNOSIS — E876 Hypokalemia: Secondary | ICD-10-CM | POA: Diagnosis not present

## 2019-01-19 DIAGNOSIS — E873 Alkalosis: Secondary | ICD-10-CM | POA: Diagnosis not present

## 2019-01-20 DIAGNOSIS — I119 Hypertensive heart disease without heart failure: Secondary | ICD-10-CM | POA: Diagnosis not present

## 2019-01-20 DIAGNOSIS — G4700 Insomnia, unspecified: Secondary | ICD-10-CM | POA: Diagnosis not present

## 2019-01-20 DIAGNOSIS — Z79899 Other long term (current) drug therapy: Secondary | ICD-10-CM | POA: Diagnosis not present

## 2019-01-20 DIAGNOSIS — F419 Anxiety disorder, unspecified: Secondary | ICD-10-CM | POA: Diagnosis not present

## 2019-01-20 DIAGNOSIS — E7800 Pure hypercholesterolemia, unspecified: Secondary | ICD-10-CM | POA: Diagnosis not present

## 2019-01-20 DIAGNOSIS — J302 Other seasonal allergic rhinitis: Secondary | ICD-10-CM | POA: Diagnosis not present

## 2019-01-20 DIAGNOSIS — D120 Benign neoplasm of cecum: Secondary | ICD-10-CM | POA: Diagnosis not present

## 2019-01-20 DIAGNOSIS — D638 Anemia in other chronic diseases classified elsewhere: Secondary | ICD-10-CM | POA: Diagnosis not present

## 2019-01-20 DIAGNOSIS — K210 Gastro-esophageal reflux disease with esophagitis: Secondary | ICD-10-CM | POA: Diagnosis not present

## 2019-01-20 DIAGNOSIS — N183 Chronic kidney disease, stage 3 (moderate): Secondary | ICD-10-CM | POA: Diagnosis not present

## 2019-01-20 DIAGNOSIS — G43909 Migraine, unspecified, not intractable, without status migrainosus: Secondary | ICD-10-CM | POA: Diagnosis not present

## 2019-01-21 ENCOUNTER — Telehealth (HOSPITAL_COMMUNITY): Payer: Self-pay | Admitting: Emergency Medicine

## 2019-01-21 NOTE — Telephone Encounter (Signed)
Reaching out to patient to offer assistance regarding upcoming cardiac imaging study; pt verbalizes understanding of appt date/time, parking situation and where to check in, pre-test NPO status and medications ordered, and verified current allergies; name and call back number provided for further questions should they arise Shaleen Talamantez RN Navigator Cardiac Imaging [facility] [phone] office [phone] cell  Pt denies covid symptoms, verbalized understanding of visitor policy. 

## 2019-01-25 ENCOUNTER — Ambulatory Visit (HOSPITAL_COMMUNITY): Admission: RE | Admit: 2019-01-25 | Payer: Medicare Other | Source: Ambulatory Visit

## 2019-01-25 ENCOUNTER — Other Ambulatory Visit: Payer: Self-pay

## 2019-01-25 ENCOUNTER — Ambulatory Visit (HOSPITAL_COMMUNITY)
Admission: RE | Admit: 2019-01-25 | Discharge: 2019-01-25 | Disposition: A | Discharge status: Home / Self Care | Payer: Medicare Other | Source: Ambulatory Visit | Attending: Student | Admitting: Student

## 2019-01-25 DIAGNOSIS — R072 Precordial pain: Secondary | ICD-10-CM | POA: Insufficient documentation

## 2019-01-25 DIAGNOSIS — R7989 Other specified abnormal findings of blood chemistry: Secondary | ICD-10-CM | POA: Insufficient documentation

## 2019-01-25 MED ORDER — NITROGLYCERIN 0.4 MG SL SUBL
0.8000 mg | SUBLINGUAL_TABLET | Freq: Once | SUBLINGUAL | Status: AC
  Administered 2019-01-25: 13:00:00 0.8 mg via SUBLINGUAL

## 2019-01-25 MED ORDER — NITROGLYCERIN 0.4 MG SL SUBL
SUBLINGUAL_TABLET | SUBLINGUAL | Status: AC
  Filled 2019-01-25: qty 2

## 2019-01-25 MED ORDER — IOHEXOL 350 MG/ML SOLN
80.0000 mL | Freq: Once | INTRAVENOUS | Status: AC | PRN
  Administered 2019-01-25: 14:00:00 80 mL via INTRAVENOUS

## 2019-01-26 ENCOUNTER — Other Ambulatory Visit: Payer: Self-pay | Admitting: Student

## 2019-01-26 ENCOUNTER — Telehealth: Payer: Self-pay | Admitting: Student

## 2019-01-26 DIAGNOSIS — Z79899 Other long term (current) drug therapy: Secondary | ICD-10-CM

## 2019-01-26 DIAGNOSIS — E785 Hyperlipidemia, unspecified: Secondary | ICD-10-CM

## 2019-01-26 MED ORDER — ATORVASTATIN CALCIUM 40 MG PO TABS: 40.0000 mg | ORAL_TABLET | Freq: Every day | ORAL | 2 refills | Status: DC

## 2019-01-26 NOTE — Telephone Encounter (Signed)
   Called and notified patient of coronary CT results. Only mild non-obstructive CAD involving LAD with calcium score of 54 isolated to LAD (74th percentile for age and sex). Patient already on beta-blocker. Most recent LDL in 11/2018 was 113. Will start Lipitor 40mg  daily and will recheck fasting lipid panel and hepatic function in 6 weeks. Also would like to add Aspirin 81mg  daily; however, patient recently diagnosed with gastritis. Advised patient to ask primary Gastroenterologist if they are okay with this prior to starting. She will call them and ask. Patient can follow-up with Dr. Meda Coffee as directed.  Darreld Mclean, PA-C 01/26/2019 12:41 PM

## 2019-03-17 DIAGNOSIS — N183 Chronic kidney disease, stage 3 unspecified: Secondary | ICD-10-CM | POA: Diagnosis not present

## 2019-03-17 DIAGNOSIS — J302 Other seasonal allergic rhinitis: Secondary | ICD-10-CM | POA: Diagnosis not present

## 2019-03-17 DIAGNOSIS — D120 Benign neoplasm of cecum: Secondary | ICD-10-CM | POA: Diagnosis not present

## 2019-03-17 DIAGNOSIS — Z79899 Other long term (current) drug therapy: Secondary | ICD-10-CM | POA: Diagnosis not present

## 2019-03-17 DIAGNOSIS — E7800 Pure hypercholesterolemia, unspecified: Secondary | ICD-10-CM | POA: Diagnosis not present

## 2019-03-17 DIAGNOSIS — G4700 Insomnia, unspecified: Secondary | ICD-10-CM | POA: Diagnosis not present

## 2019-03-17 DIAGNOSIS — F419 Anxiety disorder, unspecified: Secondary | ICD-10-CM | POA: Diagnosis not present

## 2019-03-17 DIAGNOSIS — G43909 Migraine, unspecified, not intractable, without status migrainosus: Secondary | ICD-10-CM | POA: Diagnosis not present

## 2019-03-17 DIAGNOSIS — Z0001 Encounter for general adult medical examination with abnormal findings: Secondary | ICD-10-CM | POA: Diagnosis not present

## 2019-03-17 DIAGNOSIS — K2100 Gastro-esophageal reflux disease with esophagitis, without bleeding: Secondary | ICD-10-CM | POA: Diagnosis not present

## 2019-03-17 DIAGNOSIS — D638 Anemia in other chronic diseases classified elsewhere: Secondary | ICD-10-CM | POA: Diagnosis not present

## 2019-03-29 DIAGNOSIS — K625 Hemorrhage of anus and rectum: Secondary | ICD-10-CM | POA: Diagnosis not present

## 2019-04-19 ENCOUNTER — Other Ambulatory Visit: Payer: Self-pay | Admitting: Pulmonary Disease

## 2019-04-19 DIAGNOSIS — Z1231 Encounter for screening mammogram for malignant neoplasm of breast: Secondary | ICD-10-CM

## 2019-04-23 ENCOUNTER — Other Ambulatory Visit: Payer: Self-pay | Admitting: Student

## 2019-04-23 DIAGNOSIS — E785 Hyperlipidemia, unspecified: Secondary | ICD-10-CM

## 2019-04-25 ENCOUNTER — Telehealth: Payer: Self-pay

## 2019-04-25 NOTE — Telephone Encounter (Signed)
I spoke to the patient who said that she received a call this morning 11/9 and advised her to fast for OV with Dr Meda Coffee, because she will have labs drawn then 12/8.

## 2019-04-25 NOTE — Telephone Encounter (Signed)
Received message about expiring lab orders: " Darreld Mclean, PA-C  [place] Triage        Patient started on statin in 01/2019 follow coronary CT and was instructed to have repeat fasting lipid panel and LFTs 6 weeks after that. Looks like my orders are about to expire. Can we see if patient had these done at PCP? If not, can we see if she can come in for these labs?   Thank you!  [employer] and spoke with the patient. She attempted to have labs drawn about a month ago but passed out. They were unable to get enough blood for testing. She now as a virus she is trying to recover from and does not feel well. She has an appointment with Dr. Meda Coffee 12/8. She will come fasting to that appointment (if she is recovered) and labs will be attempted at that time. She was grateful for assistance.

## 2019-05-24 ENCOUNTER — Ambulatory Visit: Payer: Medicare Other | Admitting: Cardiology

## 2019-06-21 ENCOUNTER — Ambulatory Visit: Payer: Medicare Other | Admitting: Physician Assistant

## 2019-06-30 DIAGNOSIS — D120 Benign neoplasm of cecum: Secondary | ICD-10-CM | POA: Diagnosis not present

## 2019-06-30 DIAGNOSIS — E7800 Pure hypercholesterolemia, unspecified: Secondary | ICD-10-CM | POA: Diagnosis not present

## 2019-06-30 DIAGNOSIS — G4700 Insomnia, unspecified: Secondary | ICD-10-CM | POA: Diagnosis not present

## 2019-06-30 DIAGNOSIS — F419 Anxiety disorder, unspecified: Secondary | ICD-10-CM | POA: Diagnosis not present

## 2019-06-30 DIAGNOSIS — Z79899 Other long term (current) drug therapy: Secondary | ICD-10-CM | POA: Diagnosis not present

## 2019-06-30 DIAGNOSIS — N1832 Chronic kidney disease, stage 3b: Secondary | ICD-10-CM | POA: Diagnosis not present

## 2019-06-30 DIAGNOSIS — D638 Anemia in other chronic diseases classified elsewhere: Secondary | ICD-10-CM | POA: Diagnosis not present

## 2019-06-30 DIAGNOSIS — G43909 Migraine, unspecified, not intractable, without status migrainosus: Secondary | ICD-10-CM | POA: Diagnosis not present

## 2019-06-30 DIAGNOSIS — K2100 Gastro-esophageal reflux disease with esophagitis, without bleeding: Secondary | ICD-10-CM | POA: Diagnosis not present

## 2019-08-01 ENCOUNTER — Ambulatory Visit: Payer: Medicare Other

## 2019-09-27 DIAGNOSIS — N183 Chronic kidney disease, stage 3 unspecified: Secondary | ICD-10-CM | POA: Diagnosis not present

## 2019-09-27 DIAGNOSIS — K635 Polyp of colon: Secondary | ICD-10-CM | POA: Diagnosis not present

## 2019-09-27 DIAGNOSIS — E7800 Pure hypercholesterolemia, unspecified: Secondary | ICD-10-CM | POA: Diagnosis not present

## 2019-09-27 DIAGNOSIS — F419 Anxiety disorder, unspecified: Secondary | ICD-10-CM | POA: Diagnosis not present

## 2019-09-27 DIAGNOSIS — Z139 Encounter for screening, unspecified: Secondary | ICD-10-CM | POA: Diagnosis not present

## 2019-09-27 DIAGNOSIS — Z79899 Other long term (current) drug therapy: Secondary | ICD-10-CM | POA: Diagnosis not present

## 2019-09-27 DIAGNOSIS — D638 Anemia in other chronic diseases classified elsewhere: Secondary | ICD-10-CM | POA: Diagnosis not present

## 2019-09-27 DIAGNOSIS — K2100 Gastro-esophageal reflux disease with esophagitis, without bleeding: Secondary | ICD-10-CM | POA: Diagnosis not present

## 2019-09-27 DIAGNOSIS — J302 Other seasonal allergic rhinitis: Secondary | ICD-10-CM | POA: Diagnosis not present

## 2019-09-27 DIAGNOSIS — G43909 Migraine, unspecified, not intractable, without status migrainosus: Secondary | ICD-10-CM | POA: Diagnosis not present

## 2019-10-18 DIAGNOSIS — G43909 Migraine, unspecified, not intractable, without status migrainosus: Secondary | ICD-10-CM | POA: Diagnosis not present

## 2019-10-18 DIAGNOSIS — F419 Anxiety disorder, unspecified: Secondary | ICD-10-CM | POA: Diagnosis not present

## 2019-10-18 DIAGNOSIS — Z96643 Presence of artificial hip joint, bilateral: Secondary | ICD-10-CM | POA: Diagnosis not present

## 2019-10-18 DIAGNOSIS — G4700 Insomnia, unspecified: Secondary | ICD-10-CM | POA: Diagnosis not present

## 2019-10-18 DIAGNOSIS — J302 Other seasonal allergic rhinitis: Secondary | ICD-10-CM | POA: Diagnosis not present

## 2019-10-18 DIAGNOSIS — E7800 Pure hypercholesterolemia, unspecified: Secondary | ICD-10-CM | POA: Diagnosis not present

## 2019-10-18 DIAGNOSIS — D638 Anemia in other chronic diseases classified elsewhere: Secondary | ICD-10-CM | POA: Diagnosis not present

## 2019-10-18 DIAGNOSIS — N183 Chronic kidney disease, stage 3 unspecified: Secondary | ICD-10-CM | POA: Diagnosis not present

## 2019-10-18 DIAGNOSIS — K219 Gastro-esophageal reflux disease without esophagitis: Secondary | ICD-10-CM | POA: Diagnosis not present

## 2019-10-18 DIAGNOSIS — Z79899 Other long term (current) drug therapy: Secondary | ICD-10-CM | POA: Diagnosis not present

## 2019-10-18 DIAGNOSIS — M1990 Unspecified osteoarthritis, unspecified site: Secondary | ICD-10-CM | POA: Diagnosis not present

## 2019-10-18 DIAGNOSIS — Z79891 Long term (current) use of opiate analgesic: Secondary | ICD-10-CM | POA: Diagnosis not present

## 2019-12-22 ENCOUNTER — Ambulatory Visit: Payer: Medicare Other

## 2020-01-26 DIAGNOSIS — M159 Polyosteoarthritis, unspecified: Secondary | ICD-10-CM | POA: Diagnosis not present

## 2020-01-26 DIAGNOSIS — I119 Hypertensive heart disease without heart failure: Secondary | ICD-10-CM | POA: Diagnosis not present

## 2020-01-26 DIAGNOSIS — Z79891 Long term (current) use of opiate analgesic: Secondary | ICD-10-CM | POA: Diagnosis not present

## 2020-01-26 DIAGNOSIS — G4700 Insomnia, unspecified: Secondary | ICD-10-CM | POA: Diagnosis not present

## 2020-01-26 DIAGNOSIS — J302 Other seasonal allergic rhinitis: Secondary | ICD-10-CM | POA: Diagnosis not present

## 2020-01-26 DIAGNOSIS — N183 Chronic kidney disease, stage 3 unspecified: Secondary | ICD-10-CM | POA: Diagnosis not present

## 2020-01-26 DIAGNOSIS — G43909 Migraine, unspecified, not intractable, without status migrainosus: Secondary | ICD-10-CM | POA: Diagnosis not present

## 2020-01-26 DIAGNOSIS — M2550 Pain in unspecified joint: Secondary | ICD-10-CM | POA: Diagnosis not present

## 2020-01-26 DIAGNOSIS — E7800 Pure hypercholesterolemia, unspecified: Secondary | ICD-10-CM | POA: Diagnosis not present

## 2020-01-26 DIAGNOSIS — Z79899 Other long term (current) drug therapy: Secondary | ICD-10-CM | POA: Diagnosis not present

## 2020-01-26 DIAGNOSIS — K2100 Gastro-esophageal reflux disease with esophagitis, without bleeding: Secondary | ICD-10-CM | POA: Diagnosis not present

## 2020-01-26 DIAGNOSIS — F418 Other specified anxiety disorders: Secondary | ICD-10-CM | POA: Diagnosis not present

## 2020-01-26 DIAGNOSIS — Z96643 Presence of artificial hip joint, bilateral: Secondary | ICD-10-CM | POA: Diagnosis not present

## 2020-07-12 ENCOUNTER — Other Ambulatory Visit: Payer: Self-pay | Admitting: Pulmonary Disease

## 2020-07-12 DIAGNOSIS — Z79891 Long term (current) use of opiate analgesic: Secondary | ICD-10-CM | POA: Diagnosis not present

## 2020-07-12 DIAGNOSIS — Z1231 Encounter for screening mammogram for malignant neoplasm of breast: Secondary | ICD-10-CM

## 2020-08-08 ENCOUNTER — Other Ambulatory Visit (HOSPITAL_COMMUNITY): Payer: Self-pay | Admitting: Internal Medicine

## 2020-08-08 ENCOUNTER — Ambulatory Visit: Payer: Medicare Other | Attending: Internal Medicine

## 2020-08-08 DIAGNOSIS — Z23 Encounter for immunization: Secondary | ICD-10-CM

## 2020-08-08 NOTE — Progress Notes (Signed)
   Covid-19 Vaccination Clinic  Name:  ORIYA KETTERING    MRN: [id number] DOB: [birth year]  08/08/2020  Ms. Haggard was observed post Covid-19 immunization for 15 minutes without incident. She was provided with Vaccine Information Sheet and instruction to access the V-Safe system.   Ms. Humm was instructed to call 911 with any severe reactions post vaccine: Marland Kitchen Difficulty breathing  . Swelling of face and throat  . A fast heartbeat  . A bad rash all over body  . Dizziness and weakness   Immunizations Administered    Name Date Dose VIS Date Route   PFIZER Comrnaty(Gray TOP) Covid-19 Vaccine 08/08/2020 10:07 AM 0.3 mL 05/24/2020 Intramuscular   Manufacturer: [employer], [employer]   Lot: [id number]   NDC: [phone]

## 2020-08-08 NOTE — Progress Notes (Signed)
   Covid-19 Vaccination Clinic  Name:  KAMYAH WILHELMSEN    MRN: [id number] DOB: 09/23/1945  08/08/2020  Ms. Bilbo was observed post Covid-19 immunization for 15 minutes without incident. She was provided with Vaccine Information Sheet and instruction to access the V-Safe system.   Ms. Manninen was instructed to call 911 with any severe reactions post vaccine: Marland Kitchen Difficulty breathing  . Swelling of face and throat  . A fast heartbeat  . A bad rash all over body  . Dizziness and weakness   Immunizations Administered    Name Date Dose VIS Date Route   PFIZER Comrnaty(Gray TOP) Covid-19 Vaccine 08/08/2020 10:07 AM 0.3 mL 05/24/2020 Intramuscular   Manufacturer: [employer], [employer]   Lot: [id number]   NDC: [phone]

## 2020-09-19 ENCOUNTER — Ambulatory Visit: Payer: Medicare Other

## 2020-10-08 DIAGNOSIS — Z79899 Other long term (current) drug therapy: Secondary | ICD-10-CM | POA: Diagnosis not present

## 2020-10-08 DIAGNOSIS — E7800 Pure hypercholesterolemia, unspecified: Secondary | ICD-10-CM | POA: Diagnosis not present

## 2020-10-08 DIAGNOSIS — M899 Disorder of bone, unspecified: Secondary | ICD-10-CM | POA: Diagnosis not present

## 2020-10-30 DIAGNOSIS — Z79891 Long term (current) use of opiate analgesic: Secondary | ICD-10-CM | POA: Diagnosis not present

## 2020-11-07 ENCOUNTER — Ambulatory Visit: Payer: Medicare Other

## 2021-02-20 DIAGNOSIS — Z23 Encounter for immunization: Secondary | ICD-10-CM | POA: Diagnosis not present

## 2021-02-28 DIAGNOSIS — Z79891 Long term (current) use of opiate analgesic: Secondary | ICD-10-CM | POA: Diagnosis not present

## 2021-06-25 DIAGNOSIS — E559 Vitamin D deficiency, unspecified: Secondary | ICD-10-CM | POA: Diagnosis not present

## 2021-06-25 DIAGNOSIS — R5383 Other fatigue: Secondary | ICD-10-CM | POA: Diagnosis not present

## 2021-06-25 DIAGNOSIS — Z79899 Other long term (current) drug therapy: Secondary | ICD-10-CM | POA: Diagnosis not present

## 2021-06-25 DIAGNOSIS — E7800 Pure hypercholesterolemia, unspecified: Secondary | ICD-10-CM | POA: Diagnosis not present

## 2021-06-27 DIAGNOSIS — Z79891 Long term (current) use of opiate analgesic: Secondary | ICD-10-CM | POA: Diagnosis not present

## 2021-08-15 DIAGNOSIS — K625 Hemorrhage of anus and rectum: Secondary | ICD-10-CM | POA: Diagnosis not present

## 2021-08-15 DIAGNOSIS — K219 Gastro-esophageal reflux disease without esophagitis: Secondary | ICD-10-CM | POA: Diagnosis not present

## 2021-08-15 DIAGNOSIS — Z800 Family history of malignant neoplasm of digestive organs: Secondary | ICD-10-CM | POA: Diagnosis not present

## 2021-08-15 DIAGNOSIS — K921 Melena: Secondary | ICD-10-CM | POA: Diagnosis not present

## 2021-08-15 DIAGNOSIS — Z86010 Personal history of colonic polyps: Secondary | ICD-10-CM | POA: Diagnosis not present

## 2022-02-11 DIAGNOSIS — Z79891 Long term (current) use of opiate analgesic: Secondary | ICD-10-CM | POA: Diagnosis not present

## 2022-04-22 DIAGNOSIS — G43909 Migraine, unspecified, not intractable, without status migrainosus: Secondary | ICD-10-CM | POA: Diagnosis not present

## 2022-04-22 DIAGNOSIS — E7800 Pure hypercholesterolemia, unspecified: Secondary | ICD-10-CM | POA: Diagnosis not present

## 2022-04-22 DIAGNOSIS — M65311 Trigger thumb, right thumb: Secondary | ICD-10-CM | POA: Diagnosis not present

## 2022-04-22 DIAGNOSIS — F419 Anxiety disorder, unspecified: Secondary | ICD-10-CM | POA: Diagnosis not present

## 2022-04-22 DIAGNOSIS — Z79891 Long term (current) use of opiate analgesic: Secondary | ICD-10-CM | POA: Diagnosis not present

## 2022-04-22 DIAGNOSIS — M1990 Unspecified osteoarthritis, unspecified site: Secondary | ICD-10-CM | POA: Diagnosis not present

## 2022-04-22 DIAGNOSIS — M2550 Pain in unspecified joint: Secondary | ICD-10-CM | POA: Diagnosis not present

## 2022-04-22 DIAGNOSIS — D638 Anemia in other chronic diseases classified elsewhere: Secondary | ICD-10-CM | POA: Diagnosis not present

## 2022-04-22 DIAGNOSIS — Z79899 Other long term (current) drug therapy: Secondary | ICD-10-CM | POA: Diagnosis not present

## 2022-04-22 DIAGNOSIS — J309 Allergic rhinitis, unspecified: Secondary | ICD-10-CM | POA: Diagnosis not present

## 2022-04-22 DIAGNOSIS — J0191 Acute recurrent sinusitis, unspecified: Secondary | ICD-10-CM | POA: Diagnosis not present

## 2022-04-22 DIAGNOSIS — R5383 Other fatigue: Secondary | ICD-10-CM | POA: Diagnosis not present

## 2022-07-31 DIAGNOSIS — J0191 Acute recurrent sinusitis, unspecified: Secondary | ICD-10-CM | POA: Diagnosis not present

## 2022-07-31 DIAGNOSIS — Z79899 Other long term (current) drug therapy: Secondary | ICD-10-CM | POA: Diagnosis not present

## 2022-07-31 DIAGNOSIS — F419 Anxiety disorder, unspecified: Secondary | ICD-10-CM | POA: Diagnosis not present

## 2022-07-31 DIAGNOSIS — R5383 Other fatigue: Secondary | ICD-10-CM | POA: Diagnosis not present

## 2022-07-31 DIAGNOSIS — Z79891 Long term (current) use of opiate analgesic: Secondary | ICD-10-CM | POA: Diagnosis not present

## 2022-07-31 DIAGNOSIS — M65311 Trigger thumb, right thumb: Secondary | ICD-10-CM | POA: Diagnosis not present

## 2022-07-31 DIAGNOSIS — M2550 Pain in unspecified joint: Secondary | ICD-10-CM | POA: Diagnosis not present

## 2022-07-31 DIAGNOSIS — G43909 Migraine, unspecified, not intractable, without status migrainosus: Secondary | ICD-10-CM | POA: Diagnosis not present

## 2022-07-31 DIAGNOSIS — E7800 Pure hypercholesterolemia, unspecified: Secondary | ICD-10-CM | POA: Diagnosis not present

## 2022-07-31 DIAGNOSIS — D638 Anemia in other chronic diseases classified elsewhere: Secondary | ICD-10-CM | POA: Diagnosis not present

## 2022-07-31 DIAGNOSIS — M1990 Unspecified osteoarthritis, unspecified site: Secondary | ICD-10-CM | POA: Diagnosis not present

## 2022-07-31 DIAGNOSIS — J309 Allergic rhinitis, unspecified: Secondary | ICD-10-CM | POA: Diagnosis not present

## 2022-10-09 DIAGNOSIS — Z79891 Long term (current) use of opiate analgesic: Secondary | ICD-10-CM | POA: Diagnosis not present

## 2022-11-17 DIAGNOSIS — Z79891 Long term (current) use of opiate analgesic: Secondary | ICD-10-CM | POA: Diagnosis not present

## 2023-02-11 ENCOUNTER — Telehealth: Payer: Self-pay | Admitting: Gastroenterology

## 2023-02-11 NOTE — Telephone Encounter (Signed)
Patient wishing to transfer her care specifically over to Dr. Myrtie Neither for blood in stool and to discuss a colonoscopy. Patient last had a colonoscopy in 2020. Last saw [facility] in 2023, states she was seen by them when she was in the hosptial. Patient will have records sent over to place transfer of care request.

## 2023-02-17 DIAGNOSIS — Z79891 Long term (current) use of opiate analgesic: Secondary | ICD-10-CM | POA: Diagnosis not present

## 2023-05-26 DIAGNOSIS — Z79891 Long term (current) use of opiate analgesic: Secondary | ICD-10-CM | POA: Diagnosis not present

## 2023-07-17 NOTE — Telephone Encounter (Signed)
Good morning Dr. Myrtie Neither,    Patient is requesting to transfer her care specifically over to you. Patient is wishing to be seen for blood in stool and to discuss a colonoscopy. Patient last saw [facility] gastroenterology for a colonoscopy in 2020. Patient is requesting to transfer care due to not being happy with care provided when being seen in the hospital. Patient's husband is also a current patient of yours and have been very happy with care provided. Patient's previous records are in [employer] for you to review and advise on scheduling.    Thank you.

## 2023-07-19 NOTE — Telephone Encounter (Signed)
Sure thing, yes I will accept her as a new patient.  It will have to be at my next available new patient appointment, at which time I will talk with her about her issues and review the records from [facility] that you mentioned.  Ellwood Dense MD

## 2023-07-22 ENCOUNTER — Encounter: Payer: Self-pay | Admitting: Gastroenterology

## 2023-08-25 DIAGNOSIS — Z79891 Long term (current) use of opiate analgesic: Secondary | ICD-10-CM | POA: Diagnosis not present

## 2023-09-30 ENCOUNTER — Ambulatory Visit: Payer: Medicare Other | Admitting: Gastroenterology

## 2023-11-02 DIAGNOSIS — I119 Hypertensive heart disease without heart failure: Secondary | ICD-10-CM | POA: Diagnosis not present

## 2023-11-02 DIAGNOSIS — Z79891 Long term (current) use of opiate analgesic: Secondary | ICD-10-CM | POA: Diagnosis not present

## 2023-12-30 ENCOUNTER — Ambulatory Visit: Admitting: Gastroenterology

## 2023-12-30 ENCOUNTER — Encounter: Payer: Self-pay | Admitting: Gastroenterology

## 2023-12-30 VITALS — BP 148/82 | HR 88 | Ht 64.0 in | Wt 139.0 lb

## 2023-12-30 DIAGNOSIS — Z800 Family history of malignant neoplasm of digestive organs: Secondary | ICD-10-CM

## 2023-12-30 DIAGNOSIS — K5909 Other constipation: Secondary | ICD-10-CM

## 2023-12-30 DIAGNOSIS — K5903 Drug induced constipation: Secondary | ICD-10-CM | POA: Diagnosis not present

## 2023-12-30 DIAGNOSIS — K625 Hemorrhage of anus and rectum: Secondary | ICD-10-CM | POA: Diagnosis not present

## 2023-12-30 DIAGNOSIS — Z860100 Personal history of colon polyps, unspecified: Secondary | ICD-10-CM | POA: Diagnosis not present

## 2023-12-30 DIAGNOSIS — T402X5A Adverse effect of other opioids, initial encounter: Secondary | ICD-10-CM

## 2023-12-30 MED ORDER — PEG 3350-KCL-NA BICARB-NACL 420 G PO SOLR: 4000.0000 mL | Freq: Once | ORAL | 0 refills | Status: AC

## 2023-12-30 MED ORDER — METOCLOPRAMIDE HCL 5 MG PO TABS: 5.0000 mg | ORAL_TABLET | Freq: Two times a day (BID) | ORAL | 0 refills | Status: AC | PRN

## 2023-12-30 NOTE — Progress Notes (Signed)
 [facility] Consult Note:  History: Brandi [name] 12/30/2023  Referring provider: Hillman Bare, MD  Reason for consult/chief complaint: New Patient (Initial Visit), Blood In Stools (About 6 months, have not noticed since then), and Colonoscopy (Family history of colon cancer, last colonoscopy 3 years with colon polyps)   Subjective  Prior history:  Colonoscopy and EGD as inpatient by [facility] June 2020 (at the time she ended up with cholecystectomy) Indication was family history of colon cancer and periodic rectal bleeding.  Fair quality bowel preparation, 2 subcentimeter tubular adenomas removed (Dr. Saintclair) Gastric biopsy H. pylori negative   Discussed the use of AI scribe software for clinical note transcription with the patient, who gave verbal consent to proceed.  History of Present Illness   Brandi [name] is here to discuss few things.  For years she has had chronic constipation that she attributes to 1 or more of her medicines (most notably her opiate), and has a BM about every other day with the use of Senokot and other OCP treatment.  She previously did not find much improvement from MiraLAX  or a trial of Linzess. She reports a history of colon cancer in both of her parents and a sibling.  Has periodic bleeding which she attributes to hemorrhoids, last episode about 6 months ago  ROS:  Review of Systems Denies chest pain dyspnea or dysuria Chronic back pain Depression  Past Medical History: Past Medical History:  Diagnosis Date   Anxiety    takes Valium  daily as needed   Chronic back pain    Chronic back pain    Complication of anesthesia 1980s   I stopped breathing on the table; I was gettin my wisdom teeth extracted   Constipation    Depression    takes Lexapro  daily   Dysrhythmia    pt. reports that her heart skips sometimes    GERD (gastroesophageal reflux disease)    takes Zantac  daily   History of blood transfusion    no  abnormal reaction noted   History of colon polyps    benign   History of gastric ulcer    History of GI bleed    History of hiatal hernia    History of stress test    done in [place]- done in  1992, told that it was normal.    Hypertension    Insomnia    takes Restoril  and Trazodone  nightly as needed   Joint pain    Joint swelling    Nocturia    Osteoarthritis    Peripheral edema    occasionally but never been on fluid pill     Past Surgical History: Past Surgical History:  Procedure Laterality Date   BACK SURGERY     BIOPSY  11/30/2018   Procedure: BIOPSY;  Surgeon: Saintclair Jasper, MD;  Location: [facility] ENDOSCOPY;  Service: Gastroenterology;;   CHOLECYSTECTOMY N/A 12/02/2018   Procedure: LAPAROSCOPIC CHOLECYSTECTOMY WITH INTRAOPERATIVE CHOLANGIOGRAM;  Surgeon: Vernetta Berg, MD;  Location: [facility] OR;  Service: General;  Laterality: N/A;   COLONOSCOPY     COLONOSCOPY WITH PROPOFOL  N/A 11/30/2018   Procedure: COLONOSCOPY WITH PROPOFOL ;  Surgeon: Saintclair Jasper, MD;  Location: [facility] ENDOSCOPY;  Service: Gastroenterology;  Laterality: N/A;   ESOPHAGOGASTRODUODENOSCOPY N/A 06/16/2014   Procedure: ESOPHAGOGASTRODUODENOSCOPY (EGD);  Surgeon: Norleen JAYSON Hint, MD;  Location: [facility] ENDOSCOPY;  Service: Endoscopy;  Laterality: N/A;   ESOPHAGOGASTRODUODENOSCOPY (EGD) WITH PROPOFOL  N/A 11/30/2018   Procedure: ESOPHAGOGASTRODUODENOSCOPY (EGD) WITH PROPOFOL ;  Surgeon: Saintclair Jasper, MD;  Location:  [facility] ENDOSCOPY;  Service: Gastroenterology;  Laterality: N/A;   JOINT REPLACEMENT     LUMBAR FUSION     SHOULDER SURGERY Left    TOE SURGERY Right    big toe was coming off   TOE SURGERY Left    bone spur   TOTAL HIP ARTHROPLASTY Left 02/26/2015   Procedure: TOTAL HIP ARTHROPLASTY ANTERIOR APPROACH;  Surgeon: Dempsey Sensor, MD;  Location: [facility] OR;  Service: Orthopedics;  Laterality: Left;   TOTAL HIP ARTHROPLASTY Right 03/21/2016   Procedure: TOTAL HIP ARTHROPLASTY ANTERIOR APPROACH;  Surgeon: Dempsey Sensor, MD;  Location: [facility] OR;   Service: Orthopedics;  Laterality: Right;   WISDOM TOOTH EXTRACTION  1980s     Family History: Family History  Problem Relation Age of Onset   Cancer Mother        colon cancer   Cancer Father        colon cancer   Cancer Sister 16       died of colon cancer   Heart disease Neg Hx     Social History: Social History   Socioeconomic History   Marital status: Married    Spouse name: Not on file   Number of children: Not on file   Years of education: Not on file   Highest education level: Not on file  Occupational History   Occupation: retired    Associate Professor: [employer]   Tobacco Use   Smoking status: Never   Smokeless tobacco: Never  Substance and Sexual Activity   Alcohol use: No   Drug use: No   Sexual activity: Yes    Birth control/protection: Post-menopausal  Other Topics Concern   Not on file  Social History Narrative   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[place], exercise - walking   retired Armed forces logistics/support/administrative officer, Production manager, [employer]   Social Drivers of Corporate investment banker Strain: Not on file  Food Insecurity: Not on file  Transportation Needs: Not on file  Physical Activity: Not on file  Stress: Not on file  Social Connections: Not on file   Her husband of 38 years ([place]) has developed early dementia and this affects her sleep  Allergies: Allergies  Allergen Reactions   Aspirin  Other (See Comments)    Stomach ulcer, now inactive, OK with low dose ASA    Outpatient Meds: Current Outpatient Medications  Medication Sig Dispense Refill   alum & mag hydroxide-simeth (MAALOX/MYLANTA) 200-200-20 MG/5ML suspension Take 15 mLs by mouth every 4 (four) hours as needed for indigestion or heartburn. 355 mL 0   diazepam  (VALIUM ) 10 MG tablet TAKE 1 TABLET BY MOUTH EVERY 12 HOURS AS NEEDED FOR ANXIETY 30 tablet 1   estrogen, conjugated,-medroxyprogesterone  (PREMPRO ) 0.3-1.5 MG tablet Take 1 tablet by mouth daily. 28 tablet 3   HYDROcodone -acetaminophen   (NORCO) 7.5-325 MG tablet Take 1 tablet by mouth every 8 (eight) hours.     polyethylene glycol-electrolytes (NULYTELY ) 420 g solution Take 4,000 mLs by mouth once for 1 dose. 4000 mL 0   temazepam  (RESTORIL ) 15 MG capsule 1-2 po qhs prn 60 capsule 0   atorvastatin  (LIPITOR ) 40 MG tablet TAKE 1 TABLET BY MOUTH EVERY DAY 90 tablet 2   metoprolol  tartrate (LOPRESSOR ) 100 MG tablet Take 1 tablet (100 mg total) by mouth once for 1 dose. 2 tablet 0   oxyCODONE  (OXY IR/ROXICODONE ) 5 MG immediate release tablet Take 1-2 tablets (5-10 mg total) by mouth every 4 (four) hours as needed for moderate pain. 20 tablet 0  pantoprazole  (PROTONIX ) 40 MG tablet Take 1 tablet (40 mg total) by mouth daily for 14 days. 14 tablet 0   potassium chloride  SA (K-DUR) 20 MEQ tablet Take 40 mEq by mouth 3 (three) times daily.     No current facility-administered medications for this visit.      ___________________________________________________________________ Objective   Exam:  BP (!) 148/82   Pulse 88   Ht 5' 4 (1.626 m)   Wt 139 lb (63 kg)   BMI 23.86 kg/m  Wt Readings from Last 3 Encounters:  12/30/23 139 lb (63 kg)  01/17/19 128 lb 12.8 oz (58.4 kg)  01/08/19 130 lb 14.4 oz (59.4 kg)    General: Well-appearing.  Gets on exam table without difficulty Eyes: sclera anicteric, no redness ENT: oral mucosa moist without lesions, no cervical or supraclavicular lymphadenopathy CV: Regular without appreciable murmur, no JVD, no peripheral edema Resp: clear to auscultation bilaterally, normal RR and effort noted GI: soft, no tenderness, with active bowel sounds. No guarding or palpable organomegaly noted. Skin; warm and dry, no rash or jaundice noted Neuro: awake, alert and oriented x 3. Normal gross motor function and fluent speech   Labs:  Previous endoscopy and pathology results as noted above  Encounter Diagnoses  Name Primary?   Rectal bleeding Yes   Hx of colonic polyps    Family history of  colon cancer    Chronic constipation     Assessment and Plan Assessment & Plan  Chronic stable opioid-induced constipation, currently managed with outpatient treatments.  She has tried some other things noted above that did not work well.  Strong family history colorectal cancer and personal history of colon polyps, due for surveillance   She wish to discuss a screening colonoscopy, and that is certainly indicated given her history. Procedure described along with risks and benefits and she was agreeable.  The benefits and risks of the planned procedure(s) were described in detail with the patient or (when appropriate) their health care proxy.  Risks were outlined as including, but not limited to, bleeding, infection, perforation, adverse medication reaction leading to cardiac or pulmonary decompensation, pancreatitis (if ERCP).  The limitation of incomplete mucosal visualization was also discussed.  No guarantees or warranties were given.  GoLytely  prep due to chronic constipation, and I will prescribe two 5 mg doses of metoclopramide , take 1 before the evening prep dose then another in the morning dose   Thank you for the courtesy of this consult.  Please call me with any questions or concerns.  Victory LITTIE Brand III  CC: Referring provider noted above

## 2023-12-30 NOTE — Patient Instructions (Addendum)
 We have sent the following medications to your pharmacy for you to pick up at your convenience: Reglan  - take one tablet before drinking colonoscopy prep in the afternoon (the night before) and one tablet in the morning (the day of)   You have been scheduled for a colonoscopy. Please follow written instructions given to you at your visit today.   If you use inhalers (even only as needed), please bring them with you on the day of your procedure.  DO NOT TAKE 7 DAYS PRIOR TO TEST- Trulicity (dulaglutide) Ozempic, Wegovy (semaglutide) Mounjaro (tirzepatide) Bydureon Bcise (exanatide extended release)  DO NOT TAKE 1 DAY PRIOR TO YOUR TEST Rybelsus (semaglutide) Adlyxin (lixisenatide) Victoza (liraglutide) Byetta (exanatide) ___________________________________________________________________________  _______________________________________________________  If your blood pressure at your visit was 140/90 or greater, please contact your primary care physician to follow up on this.  _______________________________________________________  If you are age 36 or older, your body mass index should be between 23-30. Your Body mass index is 23.86 kg/m. If this is out of the aforementioned range listed, please consider follow up with your Primary Care Provider.  If you are age 77 or younger, your body mass index should be between 19-25. Your Body mass index is 23.86 kg/m. If this is out of the aformentioned range listed, please consider follow up with your Primary Care Provider.   ________________________________________________________  The [facility] providers would like to encourage you to use MYCHART to communicate with providers for non-urgent requests or questions.  Due to long hold times on the telephone, sending your provider a message by [facility] may be a faster and more efficient way to get a response.  Please allow 48 business hours for a response.  Please remember that this is for  non-urgent requests.  _______________________________________________________

## 2024-01-22 ENCOUNTER — Telehealth: Payer: Self-pay | Admitting: Gastroenterology

## 2024-01-22 NOTE — Telephone Encounter (Signed)
 Would you want her seen for this first?

## 2024-01-22 NOTE — Telephone Encounter (Signed)
 Inbound call from patient requesting f/u call to discuss her having an EGD along with upcoming procedure on 02/03/24.   Patient states she is experiencing heart burn as well as belching which is leaving a bad taste in her mouth.   Please advise. Thank you

## 2024-01-26 NOTE — Telephone Encounter (Signed)
 We did not discuss that during her recent office consult with me.  My [facility] schedule is full the day of her colonoscopy, so we cannot add on an upper endoscopy to that date.  I recommend she come for her colonoscopy as scheduled, and then we can make time to discuss her symptoms further as well as a possible upper endoscopy.  HD

## 2024-01-26 NOTE — Telephone Encounter (Signed)
 Patient advised. She will mention it at the colonoscopy appointment so an appointment can be made to discuss or whatever you feel is appropriate. Expresses trust in her care.

## 2024-01-29 ENCOUNTER — Telehealth: Payer: Self-pay | Admitting: Gastroenterology

## 2024-01-29 NOTE — Telephone Encounter (Signed)
 Patient called and stated that she has been to a couple more pharmacy looking for the dulcolax and was only able to find the over night ones. Patient is wanting to know if it is ok to take them for her procedure. Patient is requesting a call back. Please advise.

## 2024-01-29 NOTE — Telephone Encounter (Signed)
 Patient confirms she has bisacodyl  brand name Dulcolax. Confirmed for the patient this is the product she is to use with her prep. No further questions of concerns.

## 2024-02-01 DIAGNOSIS — Z79891 Long term (current) use of opiate analgesic: Secondary | ICD-10-CM | POA: Diagnosis not present

## 2024-02-02 ENCOUNTER — Telehealth: Payer: Self-pay | Admitting: Gastroenterology

## 2024-02-02 ENCOUNTER — Telehealth: Payer: Self-pay | Admitting: Internal Medicine

## 2024-02-02 NOTE — Telephone Encounter (Signed)
 Pt informed on reglan  instructions Take one tablet 30-45 mins before drinking colon prep PM and AM.  She states understanding and has no further questions at this time.

## 2024-02-02 NOTE — Telephone Encounter (Signed)
 GI ON CALL  Patient reports being violently ill with prep and cannot complete, for tomorrow's colonoscopy. She will need to reschedule with an alternative prep plan. Dr. Legrand notified.  [name]. Abran Raddle., M.D. [facility] Division of Gastroenterology

## 2024-02-02 NOTE — Telephone Encounter (Signed)
 PT is calling to get clarity on when exactly she should take Reglan . Please advise.

## 2024-02-03 ENCOUNTER — Encounter: Admitting: Gastroenterology

## 2024-02-03 NOTE — Telephone Encounter (Signed)
 This patient has chronic opiate induced constipation and a suboptimal prep on her 2020 colonoscopy with Dr. Saintclair at [facility].  Thus, she needs a more challenging bowel preparation in order to be sufficiently clean for any colonoscopy.(That is, a standard 2 dose prep such as Suprep ,Sutab or Plenvu  will not work for her)  On this occasion, she was given GoLytely  with 2 doses of metoclopramide  but apparently unable to tolerate the prep.  She can be rescheduled with instructions for a 2-day MiraLAX /Suprep and be prescribed 4 doses of metoclopramide  5 mg to take during that 2-day prep.  She would need new instructions from clinic or [facility] nursing.  VEAR Brand MD

## 2024-02-03 NOTE — Telephone Encounter (Signed)
 Good morning Dr. Legrand, Patient called and stated that she is still feeling sick from the prep. Patient stated that she spoke with Dr. Abran who was the GI on call last night. Patient will call back to reschedule. Please advise.
